# Patient Record
Sex: Female | Born: 1940 | Race: White | Hispanic: No | Smoking: Current every day smoker
Health system: Southern US, Community
[De-identification: ages and names within clinical notes are randomized; demographics above are authoritative.]

## PROBLEM LIST (undated history)

## (undated) DIAGNOSIS — N950 Postmenopausal bleeding: Principal | ICD-10-CM

## (undated) DIAGNOSIS — M069 Rheumatoid arthritis, unspecified: Secondary | ICD-10-CM

## (undated) DIAGNOSIS — I951 Orthostatic hypotension: Secondary | ICD-10-CM

## (undated) DIAGNOSIS — C8590 Non-Hodgkin lymphoma, unspecified, unspecified site: Secondary | ICD-10-CM

## (undated) DIAGNOSIS — E785 Hyperlipidemia, unspecified: Secondary | ICD-10-CM

## (undated) DIAGNOSIS — Z9289 Personal history of other medical treatment: Secondary | ICD-10-CM

## (undated) DIAGNOSIS — M545 Low back pain, unspecified: Secondary | ICD-10-CM

## (undated) DIAGNOSIS — N200 Calculus of kidney: Secondary | ICD-10-CM

## (undated) DIAGNOSIS — G8929 Other chronic pain: Secondary | ICD-10-CM

## (undated) DIAGNOSIS — I2510 Atherosclerotic heart disease of native coronary artery without angina pectoris: Secondary | ICD-10-CM

## (undated) DIAGNOSIS — N183 Chronic kidney disease, stage 3 unspecified: Secondary | ICD-10-CM

## (undated) DIAGNOSIS — F329 Major depressive disorder, single episode, unspecified: Secondary | ICD-10-CM

## (undated) DIAGNOSIS — I429 Cardiomyopathy, unspecified: Secondary | ICD-10-CM

## (undated) DIAGNOSIS — M329 Systemic lupus erythematosus, unspecified: Secondary | ICD-10-CM

## (undated) DIAGNOSIS — J189 Pneumonia, unspecified organism: Secondary | ICD-10-CM

## (undated) DIAGNOSIS — F32A Depression, unspecified: Secondary | ICD-10-CM

## (undated) DIAGNOSIS — J449 Chronic obstructive pulmonary disease, unspecified: Secondary | ICD-10-CM

## (undated) DIAGNOSIS — M3500 Sicca syndrome, unspecified: Secondary | ICD-10-CM

## (undated) DIAGNOSIS — D649 Anemia, unspecified: Secondary | ICD-10-CM

## (undated) DIAGNOSIS — R55 Syncope and collapse: Secondary | ICD-10-CM

## (undated) HISTORY — DX: Chronic kidney disease, stage 3 unspecified: N18.30

## (undated) HISTORY — DX: Systemic lupus erythematosus, unspecified: M32.9

## (undated) HISTORY — PX: BREAST BIOPSY: SHX20

## (undated) HISTORY — DX: Non-Hodgkin lymphoma, unspecified, unspecified site: C85.90

## (undated) HISTORY — PX: RETINAL DETACHMENT SURGERY: SHX105

## (undated) HISTORY — DX: Cardiomyopathy, unspecified: I42.9

## (undated) HISTORY — PX: TUBAL LIGATION: SHX77

## (undated) HISTORY — DX: Major depressive disorder, single episode, unspecified: F32.9

## (undated) HISTORY — PX: EYE SURGERY: SHX253

## (undated) HISTORY — PX: DILATION AND CURETTAGE OF UTERUS: SHX78

## (undated) HISTORY — PX: LAPAROSCOPIC CHOLECYSTECTOMY: SUR755

## (undated) HISTORY — DX: Hyperlipidemia, unspecified: E78.5

## (undated) HISTORY — PX: LITHOTRIPSY: SUR834

## (undated) HISTORY — DX: Postmenopausal bleeding: N95.0

## (undated) HISTORY — PX: TONSILLECTOMY: SUR1361

## (undated) HISTORY — DX: Syncope and collapse: R55

## (undated) HISTORY — DX: Orthostatic hypotension: I95.1

## (undated) HISTORY — DX: Chronic kidney disease, stage 3 (moderate): N18.3

## (undated) HISTORY — PX: CATARACT EXTRACTION W/ INTRAOCULAR LENS  IMPLANT, BILATERAL: SHX1307

## (undated) HISTORY — PX: LYMPH NODE DISSECTION: SHX5087

## (undated) HISTORY — DX: Sjogren syndrome, unspecified: M35.00

## (undated) HISTORY — PX: MASS EXCISION: SHX2000

## (undated) HISTORY — DX: Atherosclerotic heart disease of native coronary artery without angina pectoris: I25.10

## (undated) HISTORY — DX: Depression, unspecified: F32.A

---

## 1999-08-14 ENCOUNTER — Other Ambulatory Visit: Admission: RE | Admit: 1999-08-14 | Discharge: 1999-08-14 | Payer: Self-pay | Admitting: Otolaryngology

## 1999-09-07 ENCOUNTER — Ambulatory Visit (HOSPITAL_BASED_OUTPATIENT_CLINIC_OR_DEPARTMENT_OTHER): Admission: RE | Admit: 1999-09-07 | Discharge: 1999-09-07 | Payer: Self-pay | Admitting: Otolaryngology

## 1999-09-21 ENCOUNTER — Encounter (INDEPENDENT_AMBULATORY_CARE_PROVIDER_SITE_OTHER): Payer: Self-pay | Admitting: *Deleted

## 1999-09-21 ENCOUNTER — Ambulatory Visit (HOSPITAL_BASED_OUTPATIENT_CLINIC_OR_DEPARTMENT_OTHER): Admission: RE | Admit: 1999-09-21 | Discharge: 1999-09-22 | Payer: Self-pay | Admitting: Otolaryngology

## 1999-10-11 ENCOUNTER — Encounter: Admission: RE | Admit: 1999-10-11 | Discharge: 2000-01-09 | Payer: Self-pay | Admitting: Radiation Oncology

## 1999-10-11 ENCOUNTER — Ambulatory Visit (HOSPITAL_COMMUNITY): Admission: RE | Admit: 1999-10-11 | Discharge: 1999-10-11 | Payer: Self-pay | Admitting: Radiation Oncology

## 2000-04-11 ENCOUNTER — Encounter (INDEPENDENT_AMBULATORY_CARE_PROVIDER_SITE_OTHER): Payer: Self-pay | Admitting: *Deleted

## 2000-04-11 ENCOUNTER — Ambulatory Visit (HOSPITAL_COMMUNITY): Admission: RE | Admit: 2000-04-11 | Discharge: 2000-04-11 | Payer: Self-pay | Admitting: *Deleted

## 2000-04-14 ENCOUNTER — Other Ambulatory Visit: Admission: RE | Admit: 2000-04-14 | Discharge: 2000-04-14 | Payer: Self-pay | Admitting: Otolaryngology

## 2001-01-26 ENCOUNTER — Ambulatory Visit (HOSPITAL_COMMUNITY): Admission: RE | Admit: 2001-01-26 | Discharge: 2001-01-26 | Payer: Self-pay | Admitting: Otolaryngology

## 2001-01-26 ENCOUNTER — Encounter: Payer: Self-pay | Admitting: Otolaryngology

## 2001-02-03 ENCOUNTER — Ambulatory Visit (HOSPITAL_COMMUNITY): Admission: RE | Admit: 2001-02-03 | Discharge: 2001-02-03 | Payer: Self-pay | Admitting: Otolaryngology

## 2001-02-03 ENCOUNTER — Encounter: Payer: Self-pay | Admitting: Otolaryngology

## 2001-02-06 ENCOUNTER — Ambulatory Visit (HOSPITAL_COMMUNITY): Admission: RE | Admit: 2001-02-06 | Discharge: 2001-02-06 | Payer: Self-pay | Admitting: Otolaryngology

## 2001-02-06 ENCOUNTER — Encounter: Payer: Self-pay | Admitting: Otolaryngology

## 2001-02-20 ENCOUNTER — Ambulatory Visit (HOSPITAL_COMMUNITY): Admission: RE | Admit: 2001-02-20 | Discharge: 2001-02-20 | Payer: Self-pay | Admitting: Internal Medicine

## 2001-03-27 ENCOUNTER — Ambulatory Visit (HOSPITAL_COMMUNITY): Admission: RE | Admit: 2001-03-27 | Discharge: 2001-03-27 | Payer: Self-pay | Admitting: *Deleted

## 2001-07-15 ENCOUNTER — Ambulatory Visit (HOSPITAL_COMMUNITY): Admission: RE | Admit: 2001-07-15 | Discharge: 2001-07-15 | Payer: Self-pay | Admitting: Internal Medicine

## 2001-10-12 ENCOUNTER — Ambulatory Visit (HOSPITAL_COMMUNITY): Admission: RE | Admit: 2001-10-12 | Discharge: 2001-10-12 | Payer: Self-pay

## 2002-03-15 ENCOUNTER — Encounter: Admission: RE | Admit: 2002-03-15 | Discharge: 2002-03-15 | Payer: Self-pay | Admitting: Urology

## 2002-03-15 ENCOUNTER — Encounter: Payer: Self-pay | Admitting: Urology

## 2002-03-23 ENCOUNTER — Encounter: Payer: Self-pay | Admitting: Urology

## 2002-03-23 ENCOUNTER — Ambulatory Visit (HOSPITAL_BASED_OUTPATIENT_CLINIC_OR_DEPARTMENT_OTHER): Admission: RE | Admit: 2002-03-23 | Discharge: 2002-03-23 | Payer: Self-pay | Admitting: Urology

## 2002-06-03 ENCOUNTER — Ambulatory Visit (HOSPITAL_COMMUNITY): Admission: RE | Admit: 2002-06-03 | Discharge: 2002-06-03 | Payer: Self-pay | Admitting: Urology

## 2002-06-03 ENCOUNTER — Encounter: Payer: Self-pay | Admitting: Urology

## 2003-05-26 ENCOUNTER — Ambulatory Visit (HOSPITAL_COMMUNITY): Admission: RE | Admit: 2003-05-26 | Discharge: 2003-05-26 | Payer: Self-pay

## 2003-05-26 ENCOUNTER — Emergency Department (HOSPITAL_COMMUNITY): Admission: EM | Admit: 2003-05-26 | Discharge: 2003-05-26 | Payer: Self-pay | Admitting: Emergency Medicine

## 2004-01-19 ENCOUNTER — Ambulatory Visit (HOSPITAL_COMMUNITY): Admission: RE | Admit: 2004-01-19 | Discharge: 2004-01-19 | Payer: Self-pay | Admitting: Internal Medicine

## 2004-04-26 ENCOUNTER — Emergency Department (HOSPITAL_COMMUNITY): Admission: EM | Admit: 2004-04-26 | Discharge: 2004-04-26 | Payer: Self-pay | Admitting: Emergency Medicine

## 2004-05-13 HISTORY — PX: CORONARY ANGIOPLASTY WITH STENT PLACEMENT: SHX49

## 2004-06-07 ENCOUNTER — Ambulatory Visit: Payer: Self-pay | Admitting: Internal Medicine

## 2004-06-07 ENCOUNTER — Inpatient Hospital Stay (HOSPITAL_COMMUNITY): Admission: AD | Admit: 2004-06-07 | Discharge: 2004-06-10 | Payer: Self-pay | Admitting: Internal Medicine

## 2004-06-07 ENCOUNTER — Emergency Department (HOSPITAL_COMMUNITY): Admission: EM | Admit: 2004-06-07 | Discharge: 2004-06-07 | Payer: Self-pay | Admitting: Emergency Medicine

## 2004-06-20 ENCOUNTER — Ambulatory Visit: Payer: Self-pay | Admitting: *Deleted

## 2004-08-09 ENCOUNTER — Ambulatory Visit: Payer: Self-pay | Admitting: Internal Medicine

## 2004-09-17 ENCOUNTER — Ambulatory Visit: Payer: Self-pay | Admitting: *Deleted

## 2005-07-06 ENCOUNTER — Emergency Department (HOSPITAL_COMMUNITY): Admission: EM | Admit: 2005-07-06 | Discharge: 2005-07-06 | Payer: Self-pay | Admitting: Emergency Medicine

## 2005-10-09 ENCOUNTER — Ambulatory Visit: Payer: Self-pay | Admitting: *Deleted

## 2005-10-15 ENCOUNTER — Ambulatory Visit: Payer: Self-pay | Admitting: *Deleted

## 2005-10-15 ENCOUNTER — Encounter (HOSPITAL_COMMUNITY): Admission: RE | Admit: 2005-10-15 | Discharge: 2005-11-14 | Payer: Self-pay | Admitting: *Deleted

## 2005-10-24 ENCOUNTER — Ambulatory Visit: Payer: Self-pay | Admitting: *Deleted

## 2005-11-01 ENCOUNTER — Ambulatory Visit: Payer: Self-pay | Admitting: *Deleted

## 2005-11-06 ENCOUNTER — Ambulatory Visit: Payer: Self-pay | Admitting: Cardiology

## 2005-11-06 ENCOUNTER — Encounter: Payer: Self-pay | Admitting: Cardiology

## 2005-11-06 ENCOUNTER — Inpatient Hospital Stay (HOSPITAL_COMMUNITY): Admission: AD | Admit: 2005-11-06 | Discharge: 2005-11-09 | Payer: Self-pay | Admitting: Cardiology

## 2005-11-11 ENCOUNTER — Ambulatory Visit: Payer: Self-pay | Admitting: *Deleted

## 2006-09-05 ENCOUNTER — Encounter: Payer: Self-pay | Admitting: Ophthalmology

## 2006-09-08 ENCOUNTER — Ambulatory Visit (HOSPITAL_COMMUNITY): Admission: RE | Admit: 2006-09-08 | Discharge: 2006-09-08 | Payer: Self-pay | Admitting: Ophthalmology

## 2006-11-25 ENCOUNTER — Ambulatory Visit: Payer: Self-pay | Admitting: Cardiovascular Disease

## 2007-03-28 ENCOUNTER — Observation Stay (HOSPITAL_COMMUNITY): Admission: EM | Admit: 2007-03-28 | Discharge: 2007-03-30 | Payer: Self-pay | Admitting: Emergency Medicine

## 2007-03-29 ENCOUNTER — Ambulatory Visit: Payer: Self-pay | Admitting: Internal Medicine

## 2007-03-30 ENCOUNTER — Ambulatory Visit: Payer: Self-pay | Admitting: Internal Medicine

## 2007-03-30 ENCOUNTER — Encounter: Payer: Self-pay | Admitting: Internal Medicine

## 2007-09-03 ENCOUNTER — Ambulatory Visit: Payer: Self-pay | Admitting: Cardiovascular Disease

## 2008-08-02 ENCOUNTER — Ambulatory Visit: Payer: Self-pay | Admitting: Cardiology

## 2008-08-15 ENCOUNTER — Ambulatory Visit: Payer: Self-pay | Admitting: Cardiovascular Disease

## 2008-08-15 ENCOUNTER — Encounter: Payer: Self-pay | Admitting: Physician Assistant

## 2008-08-24 ENCOUNTER — Ambulatory Visit (HOSPITAL_COMMUNITY): Admission: RE | Admit: 2008-08-24 | Discharge: 2008-08-24 | Payer: Self-pay | Admitting: Cardiology

## 2008-09-26 ENCOUNTER — Encounter: Payer: Self-pay | Admitting: Cardiology

## 2008-09-30 ENCOUNTER — Encounter (INDEPENDENT_AMBULATORY_CARE_PROVIDER_SITE_OTHER): Payer: Self-pay | Admitting: *Deleted

## 2008-10-31 DIAGNOSIS — F329 Major depressive disorder, single episode, unspecified: Secondary | ICD-10-CM

## 2008-10-31 DIAGNOSIS — C8589 Other specified types of non-Hodgkin lymphoma, extranodal and solid organ sites: Secondary | ICD-10-CM

## 2009-04-24 ENCOUNTER — Encounter (INDEPENDENT_AMBULATORY_CARE_PROVIDER_SITE_OTHER): Payer: Self-pay | Admitting: *Deleted

## 2009-05-16 ENCOUNTER — Encounter: Payer: Self-pay | Admitting: Cardiovascular Disease

## 2009-08-28 ENCOUNTER — Ambulatory Visit: Payer: Self-pay | Admitting: Cardiovascular Disease

## 2009-08-28 ENCOUNTER — Inpatient Hospital Stay (HOSPITAL_COMMUNITY): Admission: EM | Admit: 2009-08-28 | Discharge: 2009-09-03 | Payer: Self-pay | Admitting: Emergency Medicine

## 2009-08-28 ENCOUNTER — Ambulatory Visit: Payer: Self-pay | Admitting: Cardiology

## 2009-08-28 DIAGNOSIS — R55 Syncope and collapse: Secondary | ICD-10-CM

## 2009-08-29 ENCOUNTER — Encounter (INDEPENDENT_AMBULATORY_CARE_PROVIDER_SITE_OTHER): Payer: Self-pay | Admitting: Internal Medicine

## 2009-08-30 ENCOUNTER — Ambulatory Visit: Payer: Self-pay | Admitting: Cardiology

## 2009-12-09 ENCOUNTER — Inpatient Hospital Stay (HOSPITAL_COMMUNITY): Admission: EM | Admit: 2009-12-09 | Discharge: 2009-12-10 | Payer: Self-pay | Admitting: Emergency Medicine

## 2010-01-24 ENCOUNTER — Ambulatory Visit: Payer: Self-pay | Admitting: Cardiology

## 2010-01-24 ENCOUNTER — Encounter: Payer: Self-pay | Admitting: Adult Health

## 2010-01-24 DIAGNOSIS — I951 Orthostatic hypotension: Secondary | ICD-10-CM | POA: Insufficient documentation

## 2010-01-24 DIAGNOSIS — R609 Edema, unspecified: Secondary | ICD-10-CM | POA: Insufficient documentation

## 2010-02-07 ENCOUNTER — Ambulatory Visit: Payer: Self-pay | Admitting: Cardiology

## 2010-02-12 ENCOUNTER — Telehealth (INDEPENDENT_AMBULATORY_CARE_PROVIDER_SITE_OTHER): Payer: Self-pay | Admitting: *Deleted

## 2010-04-06 ENCOUNTER — Observation Stay (HOSPITAL_COMMUNITY)
Admission: EM | Admit: 2010-04-06 | Discharge: 2010-04-10 | Payer: Self-pay | Source: Home / Self Care | Admitting: Emergency Medicine

## 2010-04-08 ENCOUNTER — Encounter: Payer: Self-pay | Admitting: Cardiovascular Disease

## 2010-04-08 ENCOUNTER — Ambulatory Visit: Payer: Self-pay | Admitting: Gastroenterology

## 2010-04-09 ENCOUNTER — Ambulatory Visit: Payer: Self-pay | Admitting: Internal Medicine

## 2010-04-10 ENCOUNTER — Ambulatory Visit: Payer: Self-pay | Admitting: Internal Medicine

## 2010-04-14 ENCOUNTER — Encounter: Payer: Self-pay | Admitting: Internal Medicine

## 2010-05-21 ENCOUNTER — Ambulatory Visit
Admission: RE | Admit: 2010-05-21 | Discharge: 2010-05-21 | Payer: Self-pay | Source: Home / Self Care | Attending: Urgent Care | Admitting: Urgent Care

## 2010-05-21 DIAGNOSIS — R1319 Other dysphagia: Secondary | ICD-10-CM | POA: Insufficient documentation

## 2010-05-28 ENCOUNTER — Encounter (INDEPENDENT_AMBULATORY_CARE_PROVIDER_SITE_OTHER): Payer: Self-pay | Admitting: *Deleted

## 2010-06-12 NOTE — Letter (Signed)
[facility] ED REPORT +01/18/10  [facility] ED REPORT +01/18/10   Imported By: Faythe Ghee 01/24/2010 15:33:33  _____________________________________________________________________  External Attachment:    Type:   Image     Comment:   External Document

## 2010-06-12 NOTE — Letter (Signed)
Summary: EKG 01/18/10  EKG 01/18/10   Imported By: Faythe Ghee 01/24/2010 15:31:19  _____________________________________________________________________  External Attachment:    Type:   Image     Comment:   External Document

## 2010-06-12 NOTE — Assessment & Plan Note (Signed)
Summary: 1 YR F/U PER CHECKOUT ON 08/15/08/TG  Medications Added TRIAMCINOLONE ACETONIDE 0.5 % CREA (TRIAMCINOLONE ACETONIDE) apply as needed FISH OIL 1000 MG CAPS (OMEGA-3 FATTY ACIDS) take 1 cap daily ASPIR-LOW 81 MG TBEC (ASPIRIN) take 1 tab daily VITAMIN D 400 UNIT CAPS (CHOLECALCIFEROL) take 1 tab daily SODIUM BICARBONATE 325 MG TABS (SODIUM BICARBONATE) take 1 tab daily DAILY MULTIPLE VITAMINS  TABS (MULTIPLE VITAMIN) take 1 tab daily OMEPRAZOLE 20 MG CPDR (OMEPRAZOLE) take 1 tab daily HYDROXYCHLOROQUINE SULFATE 200 MG TABS (HYDROXYCHLOROQUINE SULFATE) take 1 tab daily FOLIC ACID 1 MG TABS (FOLIC ACID) take 1 tab daily POTASSIUM CHLORIDE 20 MEQ/15ML (10%) LIQD (POTASSIUM CHLORIDE) take 1 tab daily AMITRIPTYLINE HCL 100 MG TABS (AMITRIPTYLINE HCL) take 1 tab daily HYDROCODONE-ACETAMINOPHEN 10-500 MG TABS (HYDROCODONE-ACETAMINOPHEN) take as needed DOXYCYCLINE HYCLATE 100 MG SOLR (DOXYCYCLINE HYCLATE) take 1 tab daily      Allergies Added: NKDA  Visit Type:  Follow-up Primary Provider:  Dr.Quereshi  CC:  [facility]  History of Present Illness: Tiffany [name] is a 70 year old female patient with a history of coronary artery disease status post inferior ST- elevation myocardial infarction January 2006 treated with a drug-eluting stent to the mid RCA who subsequent underwent relook heart catheterization in June 2007 in the setting of a false positive stress test.  According to the records, the patient had a patent stent in the RCA and nonobstructive disease elsewhere.  Apparently, her LV function has been low normal to normal in the past.  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 Lowell Guitar in [place].   She was seen in the [facility] ER on 4/11 for postural Hypotension.  I reviewed these records.  Her labs were ok but no TSH or cortisol was checked.  She has had mailaise and fatigue for a few months with posturla dizzyness over the  last few weeds.  She denies SSCP, palpitaiotns, or edema.  She had her BB stopped in the ER and is on no cardiac meds outside of ASA/Plavix.    In th office today her baseline BP is 90/40 and it drops to 50/palp with the patient about to pass out.  I did not feel comfortable sending the patient home.  She will be sent to the ER for hydration and admision to the hospitalist service.  I would check TSH/cortisol, blood cultures, echo and start florinef .2mg .    Current Problems (verified): 1)  Cad  (ICD-414.00) 2)  Lymphoma  (ICD-202.80) 3)  Lupus  (ICD-710.0) 4)  Depression  (ICD-311) 5)  Hypertension, Unspecified  (ICD-401.9) 6)  Hyperlipidemia-mixed  (ICD-272.4)  Current Medications (verified): 1)  Plavix 75 Mg Tabs (Clopidogrel Bisulfate) .... Take One Tablet By Mouth Daily 2)  Lipitor 40 Mg Tabs (Atorvastatin Calcium) .... Take One Tablet By Mouth Daily. 3)  Nitrostat 0.4 Mg Subl (Nitroglycerin) .Marland Kitchen.. 1 Tablet Under Tongue At Onset of Chest Pain; You May Repeat Every 5 Minutes For Up To 3 Doses. 4)  Triamcinolone Acetonide 0.5 % Crea (Triamcinolone Acetonide) .... Apply As Needed 5)  Fish Oil 1000 Mg Caps (Omega-3 Fatty Acids) .... Take 1 Cap Daily 6)  Aspir-Low 81 Mg Tbec (Aspirin) .... Take 1 Tab Daily 7)  Vitamin D 400 Unit Caps (Cholecalciferol) .... Take 1 Tab Daily 8)  Sodium Bicarbonate 325 Mg Tabs (Sodium Bicarbonate) .... Take 1 Tab Daily 9)  Daily Multiple Vitamins  Tabs (Multiple Vitamin) .... Take 1 Tab Daily 10)  Omeprazole 20 Mg Cpdr (Omeprazole) .... Take 1 Tab Daily 11)  Hydroxychloroquine Sulfate 200 Mg Tabs (Hydroxychloroquine Sulfate) .... Take 1 Tab Daily 12)  Folic Acid 1 Mg Tabs (Folic Acid) .... Take 1 Tab Daily 13)  Potassium Chloride 20 Meq/65ml (10%) Liqd (Potassium Chloride) .... Take 1 Tab Daily 14)  Amitriptyline Hcl 100 Mg Tabs (Amitriptyline Hcl) .... Take 1 Tab Daily 15)  Hydrocodone-Acetaminophen 10-500 Mg Tabs (Hydrocodone-Acetaminophen) .... Take As  Needed 16)  Doxycycline Hyclate 100 Mg Solr (Doxycycline Hyclate) .... Take 1 Tab Daily  Allergies (verified): No Known Drug Allergies  Past History:  Past Medical History: Last updated: 10/31/2008 LYMPHOMA (ICD-202.80) LUPUS (ICD-710.0) DEPRESSION (ICD-311) HYPERTENSION, UNSPECIFIED (ICD-401.9) HYPERLIPIDEMIA-MIXED (ICD-272.4) CAD:  IMI 2006 with stent to RCA.  Relook 2007 patent    Past Surgical History: Last updated: 10/31/2008 eye surgery left breast biopsy  nodule removed due to lymphoma.      Family History: Last updated: 10/31/2008 Family History of Cancer:  Family History of Coronary Artery Disease:   Social History: Last updated: 10/31/2008 Retired  Divorced  Tobacco Use - Yes.  Alcohol Use - no  Review of Systems       Denies fever, malais, weight loss, blurry vision, decreased visual acuity, cough, sputum, SOB, hemoptysis, pleuritic pain, palpitaitons, heartburn, abdominal pain, melena, lower extremity edema, claudication, or rash.   Vital Signs:  Patient profile:   70 year old female Height:      71 inches Weight:      162 pounds BMI:     22.68 Pulse rate:   88 / minute BP sitting:   90 / 50  (right arm) BP standing:   50 /   (left arm)  Vitals Entered By: Dreama Saa, CNA (August 28, 2009 11:20 AM)  Physical Exam  General:  Affect appropriate Healthy:  appears stated age HEENT: normal Neck supple with no adenopathy JVP normal no bruits no thyromegaly Lungs clear with no wheezing and good diaphragmatic motion Heart:  S1/S2 no murmur,rub, gallop or click PMI normal Abdomen: benighn, BS positve, no tenderness, no AAA no bruit.  No HSM or HJR Distal pulses intact with no bruits No edema Neuro non-focal Skin warm and dry    Impression & Recommendations:  Problem # 1:  CAD (ICD-414.00) STable no angina The following medications were removed from the medication list:    Metoprolol Tartrate 25 Mg Tabs (Metoprolol tartrate) .Marland Kitchen... Take  1 tab daily Her updated medication list for this problem includes:    Plavix 75 Mg Tabs (Clopidogrel bisulfate) .Marland Kitchen... Take one tablet by mouth daily    Nitrostat 0.4 Mg Subl (Nitroglycerin) .Marland Kitchen... 1 tablet under tongue at onset of chest pain; you may repeat every 5 minutes for up to 3 doses.    Aspir-low 81 Mg Tbec (Aspirin) .Marland Kitchen... Take 1 tab daily  Problem # 2:  SYNCOPE (ICD-780.2) Postural hypotension with no identifying cause.  Admit to hopitalist service We will follow.  Hydrate, florinrf .2mg  check echo Continue to hold BB The following medications were removed from the medication list:    Metoprolol Tartrate 25 Mg Tabs (Metoprolol tartrate) .Marland Kitchen... Take 1 tab daily Her updated medication list for this problem includes:    Plavix 75 Mg Tabs (Clopidogrel bisulfate) .Marland Kitchen... Take one tablet by mouth daily    Nitrostat 0.4 Mg Subl (Nitroglycerin) .Marland Kitchen... 1 tablet under tongue at onset of chest pain; you may repeat every 5 minutes for up to 3 doses.    Aspir-low 81 Mg  Tbec (Aspirin) .Marland Kitchen... Take 1 tab daily

## 2010-06-12 NOTE — Letter (Signed)
Summary: CHEST X RAY 01/18/10  CHEST X RAY 01/18/10   Imported By: Faythe Ghee 01/24/2010 15:31:57  _____________________________________________________________________  External Attachment:    Type:   Image     Comment:   External Document

## 2010-06-12 NOTE — Assessment & Plan Note (Signed)
Summary: per pt request/states PCP wants her to f/u with Cardiologist/tg   Visit Type:  Follow-up Primary Provider:  Dr.Quereshi  CC:  no cardiology complaints.  History of Present Illness: Tiffany [name] is a 70 y/o CF with known history of CAD with stent DES to RCA, Lupus and Sjogren's syndrome who was recently seen in [facility] hospital on 01/18/2010 for dizziness.  She was found to be positive for orthostatic hypotension with BP fluctuation from 115 mmHg systolic to 80 mmHg.  Review of records demonstrates chronic kidney disease and postural hypotension in April 2011.  She was placed on florinef. Her creatnine level was 1.9 in ER. She states she followed up with her Rheumatologist, Dr. Dareen Piano in [place].  She states he told her not to take the medicaiton as it would increase fluid retention and salt, which would be bad for her kidney's.  She therefore stopping taking it. She states, taking the medication did not help her to feel better.  She does not wish to take the mediction at all and is requesting alternative.  Current Medications (verified): 1)  Lipitor 40 Mg Tabs (Atorvastatin Calcium) .... Take One Tablet By Mouth Daily. 2)  Nitrostat 0.4 Mg Subl (Nitroglycerin) .Marland Kitchen.. 1 Tablet Under Tongue At Onset of Chest Pain; You May Repeat Every 5 Minutes For Up To 3 Doses. 3)  Triamcinolone Acetonide 0.5 % Crea (Triamcinolone Acetonide) .... Apply As Needed 4)  Fish Oil 1000 Mg Caps (Omega-3 Fatty Acids) .... Take 1 Cap Daily 5)  Aspir-Low 81 Mg Tbec (Aspirin) .... Take 1 Tab Daily 6)  Vitamin D 400 Unit Caps (Cholecalciferol) .... Take 1 Tab Daily 7)  Sodium Bicarbonate 325 Mg Tabs (Sodium Bicarbonate) .... Take 1 Tab Daily 8)  Daily Multiple Vitamins  Tabs (Multiple Vitamin) .... Take 1 Tab Daily 9)  Omeprazole 20 Mg Cpdr (Omeprazole) .... Take 1 Tab Daily 10)  Hydroxychloroquine Sulfate 200 Mg Tabs (Hydroxychloroquine Sulfate) .... Take 1 Tab Two Times A Day 11)  Folic Acid 1 Mg Tabs (Folic  Acid) .... Take 1 Tab Daily 12)  Potassium Chloride 20 Meq/45ml (10%) Liqd (Potassium Chloride) .... Take 1 Tab Daily 13)  Amitriptyline Hcl 100 Mg Tabs (Amitriptyline Hcl) .... Take 1 Tab Daily 14)  Hydrocodone-Acetaminophen 10-500 Mg Tabs (Hydrocodone-Acetaminophen) .... Take As Needed 15)  Azathioprine 50 Mg Tabs (Azathioprine) .... Take 1 Tab Two Times A Day 16)  Alprazolam 0.5 Mg Tabs (Alprazolam) .... Take 1 Tab Three Times A Day 17)  Levothyroxine Sodium 25 Mcg Tabs (Levothyroxine Sodium) .... Take 1 Tab Daily 18)  Sodium Chloride 5 % Soln (Sodium Chloride) .... Right Eye Qid 19)  Lubricant Eye Drops 0.4-0.3 % Soln (Polyethyl Glycol-Propyl Glycol) .Marland Kitchen.. 1 Drop Each Eye Qid 20)  Midodrine Hcl 10 Mg Tabs (Midodrine Hcl) .... Take 1 Tablet By Mouth Three Times A Day  Allergies (verified): No Known Drug Allergies  Past History:  Past Medical History: LYMPHOMA (ICD-202.80) LUPUS (ICD-710.0) DEPRESSION (ICD-311) HYPERTENSION, UNSPECIFIED (ICD-401.9) HYPERLIPIDEMIA-MIXED (ICD-272.4) CAD:  IMI 2006 with stent to RCA.  Relook 2007 patent Sjogrens Syndrome    Review of Systems       Dizziness and lightheadness.  Vital Signs:  Patient profile:   70 year old female Weight:      151 pounds Pulse rate:   89 / minute BP sitting:   132 / 71  (right arm)  Vitals Entered By: Dreama Saa, CNA (January 24, 2010 10:15 AM)  Physical Exam  General:  Well developed, well nourished,  in no acute distress. Head:  normocephalic and atraumatic Eyes:  PERRLA/EOM intact; conjunctiva and lids normal. Lungs:  Clear bilaterally to auscultation and percussion. Heart:  Non-displaced PMI, chest non-tender; regular rate and rhythm, S1, S2 without murmurs, rubs or gallops. Carotid upstroke normal, no bruit. Normal abdominal aortic size, no bruits. Femorals normal pulses, no bruits. Pedals normal pulses. No edema, no varicosities. Abdomen:  Bowel sounds positive; abdomen soft and non-tender without  masses, organomegaly, or hernias noted. No hepatosplenomegaly. Msk:  Back normal, normal gait. Muscle strength and tone normal. Pulses:  pulses normal in all 4 extremities Extremities:  No clubbing or cyanosis. Neurologic:  Alert and oriented x 3. Skin:  eczematous rash arms.Marland Kitchen   Psych:  Normal affect.   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  She is not on diuretics or antihypertensive.  Discussed this with Dr. Eden Emms who advised beginning Midodrine 10mg  three times a day.  He was also comfortable with her continuing florinef if the patient did not want to change medication for a three times a day dose.    I spoke with Dr. Dareen Piano, her rheumatologist today by phone.  He stated that he had no problem with the florinef and she could continue taking this, but midodrone was also acceptable.  He stated that he did not tell her to stop taking the florinef as the patient stated.  Fuirther discussion with the patient disputes this and she does not want to go back on florinef as she is afraid it would affect her kidney's.  I have therefore started her on the midodrine.  She is also to have knee high TED hose measured and worn daily.  She will follow-up with Dr. Eden Emms in a week - 10 days for evaluation of response to medication addition.  Problem # 2:  CAD (ICD-414.00) She denies chest pain or shortness of breath. With midodrine being a vasoconstrictor we will need to follow for occurance of angina pain. The following medications were removed from the medication list:    Plavix 75 Mg Tabs (Clopidogrel bisulfate) .Marland Kitchen... Take one tablet by mouth daily Her updated medication list for this problem includes:    Nitrostat 0.4 Mg Subl (Nitroglycerin) .Marland Kitchen... 1 tablet under tongue at onset of chest pain; you may repeat every 5 minutes for up to 3 doses.    Aspir-low 81 Mg  Tbec (Aspirin) .Marland Kitchen... Take 1 tab daily  Problem # 3:  DEPRESSION (ICD-311) He daughter wishes her to stop taking Xanax as she feels it is making her depression worse.  I have advised that she follow with her primary care physician for this discussion.  Other Orders: Durable Medical Equipment (DME)  Patient Instructions: 1)  Your physician recommends that you schedule a follow-up appointment in: 1 week 2)  Your physician has recommended you make the following change in your medication: Start taking Midodrine 10mg  by mouth three times a day  3)  Your physician recommends you be fitted for compression stockings. You have been given a prescription to take with you for stockings fitting.  Prescriptions: MIDODRINE HCL 10 MG TABS (MIDODRINE HCL) take 1 tablet by mouth three times a day  #90 x 3   Entered by:   Larita Fife Via LPN   Authorized by:   Joni Reining, NP   Signed by:   Larita Fife Via LPN on 16/02/9603   Method used:   [facility]  [facility]        [facility] [phone]* (retail)       [address]       [place], [place]  [address]       Ph: [phone] or [phone]       Fax: [phone]   RxID:   [id number]

## 2010-06-12 NOTE — Progress Notes (Signed)
Summary: medicine issues  Phone Note Call from Patient   [facility] [phone] Call For: medicine issues Summary of Call: kathyrn at [facility] called to let Bronson Curb be aware that Farra Spiering was given a prescription from Dr.Qureshi that was filled on 02/02/2010 for xanax 0.5mg  90 tabs our office gave patient diazepam 30 tabs 02/07/2010 now she is at pharmacy to fill this prescription called kathyrn she stated patient can have the diazepam but has to stop the xanax ,called pharmacy back and let Bronson Curb know that it is ok  for patient to have this drug. Initial call taken by: Dreama Saa, CNA,  February 12, 2010 8:36 AM

## 2010-06-12 NOTE — Assessment & Plan Note (Signed)
Summary: 1 wk f/u per checkout on 01/24/10/tg   Visit Type:  Follow-up Primary Provider:  Dr.Quereshi  CC:  some sob.  History of Present Illness: Tiffany [name] is a 70 y/o CF with known history of CAD with stent DES to RCA, Lupus and Sjogren's syndrome who was recently seen in [facility] hospital on 01/18/2010 for dizziness.  She was found to be positive for orthostatic hypotension with BP fluctuation from 115 mmHg systolic to 80 mmHg.  Review of records demonstrates chronic kidney disease and postural hypotension in April 2011. She was placed on Midodrine 10mg  three times a day and was measured for and had TED hose instituted, as she did not want to continue taking Florinef.  She is being followed by Dr. Dareen Piano, Rheumtologist, in [place] and was concerned how Florinef would affect her other medications and stopped taking it.  She is here for evaluation.  She is tolerating the midodrine well with only minimal dizziness, and only with sudden postition change.  She has had recent diagnosis of hiatal hernia within the last two days whiich is pulling her focus some.  She is tolerating the TED hose as well.  Current Medications (verified): 1)  Lipitor 40 Mg Tabs (Atorvastatin Calcium) .... Take One Tablet By Mouth Daily. 2)  Nitrostat 0.4 Mg Subl (Nitroglycerin) .Marland Kitchen.. 1 Tablet Under Tongue At Onset of Chest Pain; You May Repeat Every 5 Minutes For Up To 3 Doses. 3)  Triamcinolone Acetonide 0.5 % Crea (Triamcinolone Acetonide) .... Apply As Needed 4)  Fish Oil 1000 Mg Caps (Omega-3 Fatty Acids) .... Take 1 Cap Daily 5)  Aspir-Low 81 Mg Tbec (Aspirin) .... Take 1 Tab Daily 6)  Vitamin D 400 Unit Caps (Cholecalciferol) .... Take 1 Tab Daily 7)  Sodium Bicarbonate 325 Mg Tabs (Sodium Bicarbonate) .... Take 1 Tab Daily 8)  Daily Multiple Vitamins  Tabs (Multiple Vitamin) .... Take 1 Tab Daily 9)  Omeprazole 20 Mg Cpdr (Omeprazole) .... Take 1 Tab Daily 10)  Hydroxychloroquine Sulfate 200 Mg Tabs  (Hydroxychloroquine Sulfate) .... Take 1 Tab Two Times A Day 11)  Folic Acid 1 Mg Tabs (Folic Acid) .... Take 1 Tab Daily 12)  Potassium Chloride 20 Meq/6ml (10%) Liqd (Potassium Chloride) .... Take 1 Tab Daily 13)  Amitriptyline Hcl 100 Mg Tabs (Amitriptyline Hcl) .... Take 1 Tab Daily 14)  Hydrocodone-Acetaminophen 10-500 Mg Tabs (Hydrocodone-Acetaminophen) .... Take As Needed 15)  Azathioprine 50 Mg Tabs (Azathioprine) .... Take 1 Tab Two Times A Day 16)  Alprazolam 0.5 Mg Tabs (Alprazolam) .... Take 1 Tab Three Times A Day 17)  Levothyroxine Sodium 25 Mcg Tabs (Levothyroxine Sodium) .... Take 1 Tab Daily 18)  Sodium Chloride 5 % Soln (Sodium Chloride) .... Right Eye Qid 19)  Lubricant Eye Drops 0.4-0.3 % Soln (Polyethyl Glycol-Propyl Glycol) .Marland Kitchen.. 1 Drop Each Eye Qid 20)  Midodrine Hcl 10 Mg Tabs (Midodrine Hcl) .... Take 1 Tablet By Mouth Three Times A Day 21)  Valium 5 Mg Tabs (Diazepam) .... Take 1 Tablet By Mouth Two Times A Day As Needed Anxiety  Allergies (verified): No Known Drug Allergies  Comments:  Nurse/Medical Assistant: brought med list reviewed with patient midodrine 10mg  added at last ov  Review of Systems       Mild dizziness with postition change only. Depression is worse.    Vital Signs:  Patient profile:   69 year old female Weight:      152 pounds BMI:     21.28 Pulse rate:   81 /  minute BP sitting:   101 / 71  (right arm)  Vitals Entered By: Dreama Saa, CNA (February 07, 2010 10:16 AM)  Physical Exam  General:  normal appearance.   Lungs:  Clear bilaterally to auscultation and percussion. Heart:  Non-displaced PMI, chest non-tender; regular rate and rhythm, S1, S2 without murmurs, rubs or gallops. Carotid upstroke normal, no bruit. Normal abdominal aortic size, no bruits. Femorals normal pulses, no bruits. Pedals normal pulses. No edema, no varicosities. She is wearing knee high TED hose. Abdomen:  Bowel sounds positive; abdomen soft and non-tender  without masses, organomegaly, or hernias noted. No hepatosplenomegaly. Msk:  Back normal, normal gait. Muscle strength and tone normal. Pulses:  pulses normal in all 4 extremities Extremities:  No clubbing or cyanosis. Neurologic:  Alert and oriented x 3. Psych:  depressed affect.     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  She only has transient dizziness with postition change but this goes away quickly.  Will continue the medication as directed and she will follow-up with [place] in 6 months.  Problem # 2:  CAD (ICD-414.00) Assessment: Unchanged She has recently been diagnosed with hiatal hernia. She is not attributing her occasional chest discomfort to this. She is being followed by her primary for continued treatment. Her updated medication list for this problem includes:    Nitrostat 0.4 Mg Subl (Nitroglycerin) .Marland Kitchen... 1 tablet under tongue at onset of chest pain; you may repeat every 5 minutes for up to 3 doses.    Aspir-low 81 Mg Tbec (Aspirin) .Marland Kitchen... Take 1 tab daily  Problem # 3:  DEPRESSION (ICD-311) She wishes to be referred to another primary care physician as she is unhappy with current one. He is ignoring her depression symptoms according her and her daughter. She asks to stop taking xanax and requests vallium.  I recommended that she find another primary if she is unhappy with her current physician and have given her the names of 4 separate practices to choose from.  I have given her a RX for valium 5 mg two times a day as needed with NO REFILLS and advised her to seek attention for her depression and anxiety issues. She verbalizes understanding.  Patient Instructions: 1)  Your physician recommends that you schedule a follow-up appointment in: 6 months 2)  Your physician has recommended you make the following change in your medication: DO NOT TAKE ANYONE ELSE'S XANAX 3)  NEW MEDICATION:  valium 5mg  two times a day as needed  Prescriptions: VALIUM 5 MG TABS (DIAZEPAM) Take 1 tablet by mouth two times a day as needed anxiety  #60 x 0   Entered by:   Teressa Lower RN   Authorized by:   Joni Reining, NP   Signed by:   Teressa Lower RN on 02/07/2010   Method used:   Print then Give to Patient   RxID:   [id number]

## 2010-06-12 NOTE — Miscellaneous (Signed)
Summary: lipitor refill  Clinical Lists Changes  Medications: Added new medication of LIPITOR 40 MG TABS (ATORVASTATIN CALCIUM) Take one tablet by mouth daily. - Signed Rx of LIPITOR 40 MG TABS (ATORVASTATIN CALCIUM) Take one tablet by mouth daily.;  #30 x 6;  Signed;  Entered by: Teressa Lower RN;  Authorized by: Colon Branch, MD, [facility];  Method used: [facility] [phone]*, [address] [place], [place], [place]  [address], Ph: [phone] or [phone], Fax: [phone]    Prescriptions: LIPITOR 40 MG TABS (ATORVASTATIN CALCIUM) Take one tablet by mouth daily.  #30 x 6   Entered by:   Teressa Lower RN   Authorized by:   Colon Branch, MD, [facility]   Signed by:   Teressa Lower RN on 05/16/2009   Method used:   [facility] to        [facility] [phone]* (retail)       [address]       [place], [place]  [address]       Ph: [phone] or [phone]       Fax: [phone]   RxID:   [id number]

## 2010-06-12 NOTE — Letter (Signed)
Summary: [facility] 12/07/09  [facility] 12/07/09   Imported By: Faythe Ghee 01/24/2010 15:32:16  _____________________________________________________________________  External Attachment:    Type:   Image     Comment:   External Document

## 2010-06-12 NOTE — Letter (Addendum)
Summary: Patient Notice, Endo Biopsy Results  [facility]  [address]   [place], [place] [address]   Phone: [phone]  Fax: [phone]       April 14, 2010   Tiffany [name] [address] [place], [place]  [address] 1940/07/07    Dear Tiffany [name],  I am pleased to inform you that the biopsies taken during your recent endoscopic examination did not show any evidence of cancer upon pathologic examination.  There was some inflammation.  Additional information/recommendations:  Continue with the treatment plan as outlined on the day of your exam.  Please call us if you are having persistent problems or have questions about your condition that have not been fully answered at this time.  Sincerely,    R. Roetta Sessions MD, FACP [facility] Ph: [phone]   Fax: [phone]   Appended Document: Patient Notice, Endo Biopsy Results letter mailed to pt

## 2010-06-14 NOTE — Assessment & Plan Note (Signed)
Summary: HOS FU IN 4 WEEKS,DYSPEPSIA/SS   Visit Type:  Follow-up Visit Primary Care Provider:  Gae Gallop  Chief Complaint:  F/U  hosp/dyspepsia.  History of Present Illness: Here for FU post EGD.  Denies any odynophagia or dysphagia.  Denies regurgitation.  Denies rectal bleeding, melena or abdominal pain. 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  Patulous EG junction, hiatal hernia, mod chronic active gastritis.  On omeprazole 20mg  daily, finished carafate.   Feels well without concerns.  Current Medications (verified): 1)  Lipitor 40 Mg Tabs (Atorvastatin Calcium) .... Take One Tablet By Mouth Daily. 2)  Nitrostat 0.4 Mg Subl (Nitroglycerin) .Marland Kitchen.. 1 Tablet Under Tongue At Onset of Chest Pain; You May Repeat Every 5 Minutes For Up To 3 Doses. 3)  Triamcinolone Acetonide 0.5 % Crea (Triamcinolone Acetonide) .... Apply As Needed 4)  Fish Oil 1000 Mg Caps (Omega-3 Fatty Acids) .... Take 1 Cap Daily 5)  Aspir-Low 81 Mg Tbec (Aspirin) .... Take 1 Tab Daily 6)  Vitamin D 400 Unit Caps (Cholecalciferol) .... Take 1 Tab Daily 7)  Sodium Bicarbonate 325 Mg Tabs (Sodium Bicarbonate) .... Take 1 Tab Daily 8)  Daily Multiple Vitamins  Tabs (Multiple Vitamin) .... Take 1 Tab Daily 9)  Omeprazole 20 Mg Cpdr (Omeprazole) .... Take 1 Tab Daily 10)  Hydroxychloroquine Sulfate 200 Mg Tabs (Hydroxychloroquine Sulfate) .... Take 1 Tab Two Times A Day 11)  Folic Acid 1 Mg Tabs (Folic Acid) .... Take 1 Tab Daily 12)  Potassium Chloride 20 Meq/56ml (10%) Liqd (Potassium Chloride) .... Take 1 Tab Daily 13)  Amitriptyline Hcl 100 Mg Tabs (Amitriptyline Hcl) .... Take 1 Tab Daily 14)  Hydrocodone-Acetaminophen 10-500 Mg Tabs (Hydrocodone-Acetaminophen) .... Take As Needed 15)  Azathioprine 50 Mg Tabs (Azathioprine) .... Take 1 Tab Two Times A Day 16)  Alprazolam 0.5 Mg Tabs (Alprazolam)  .... Take 1 Tab Three Times A Day 17)  Levothyroxine Sodium 25 Mcg Tabs (Levothyroxine Sodium) .... Take 1 Tab Daily 18)  Sodium Chloride 5 % Soln (Sodium Chloride) .... Right Eye Qid 19)  Lubricant Eye Drops 0.4-0.3 % Soln (Polyethyl Glycol-Propyl Glycol) .Marland Kitchen.. 1 Drop Each Eye Qid 20)  Midodrine Hcl 10 Mg Tabs (Midodrine Hcl) .... Take 1 Tablet By Mouth Three Times A Day  Allergies (verified): 1)  ! Sulfa  Vital Signs:  Patient profile:   70 year old female Height:      71 inches Weight:      142 pounds BMI:     19.88 Temp:     98.1 degrees F oral Pulse rate:   92 / minute BP sitting:   124 / 70  (right arm) Cuff size:   regular  Vitals Entered By: Cloria Spring LPN (May 21, 2010 2:17 PM)  Physical Exam  General:  Well developed, well nourished, no acute distress. Head:  Normocephalic and atraumatic. Eyes:  PERRLA, no icterus. Mouth:  No deformity or lesions, dentition normal. Heart:  Regular rate and rhythm; no murmurs, rubs,  or bruits. Abdomen:  Soft, nontender and nondistended. No masses, hepatosplenomegaly or hernias noted. Normal bowel sounds.without guarding and without rebound.   Msk:  Symmetrical with no gross deformities. Normal posture. Pulses:  Normal pulses noted. Extremities:  No clubbing, cyanosis, edema or deformities noted. Neurologic:  Alert and  oriented x4;  grossly normal neurologically. Skin:  Intact without significant lesions or rashes. Psych:  Alert and cooperative.  Normal mood and affect. 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  Dysphagia resolved s/p dilation.  Orders: Est. Patient Level III ([address])  Patient Instructions: 1)  Continue omeprazole 20mg  daily 2)  Office visit 1 yr or sooner if needed

## 2010-06-14 NOTE — Miscellaneous (Signed)
Summary: CONSULTATION  Clinical Lists Changes  NAME:  Tiffany [name], Tiffany [name]                ACCOUNT NO.:  [id number]      MEDICAL RECORD NO.:  [id number]          PATIENT TYPE:  OBV      LOCATION:  A328                          FACILITY:  [facility]      PHYSICIAN:  Tiffany Eva, M.D.     DATE OF BIRTH:  1941-02-03      DATE OF CONSULTATION:  04/08/2010   DATE OF DISCHARGE:                                    CONSULTATION         PRIMARY CARE PHYSICIAN:  Dr. Virgina [name].      PRIMARY NEPHROLOGIST:  Tiffany [name].      HISTORY OF PRESENT ILLNESS:  Tiffany [name] is a 70 year old female who was   last seen and evaluated by Tiffany [name] in November 2008 for hematochezia.   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  She had an EGD and a   colonoscopy at that time.  Biopsies of the rectum showed hyperplastic   polyps.  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  She reports that her rheumatologist told her she has   Sjogren's syndrome.  She is currently on Imuran and not really sure why.   She reports left chest pain and epigastric pain to me.  Her symptoms   began Friday.  Initial H and P notes that she had right-sided abdominal   pain.  She reports that the symptoms began Friday.  They were preceded   on Thursday by one episode of vomiting.  She denies any nausea.  The   pain lasted from 30 minutes to one hour.  On Saturday she had no pain.   She had no bowel movement since admission.  She does have a history of   constipation.  She is currently on MiraLAX.  She experiences burping two   to three times a week but denies any heartburn or indigestion.  She is   maintained on aspirin and omeprazole as an outpatient.  She denies any   blood in her stool.  She has had no melena.  She no longer takes Plavix.   She does use aspirin but she denies taking any ibuprofen or Motrin.  Her   symptoms do not seem to be related to food.  She has no sick contacts.   She has problems  swallowing pills, cornbread and milk.  She has no   problems with meat but she does not eat meat on a daily basis.  She was   taken off of her iron pills.      PAST MEDICAL HISTORY:   1. Coronary artery disease, acute MI in 2006 with stent in the right       coronary artery.   2. 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   3. Renal insufficiency.   4. Lupus versus Sjogren's disease.   5. Hyperlipidemia.   6. GERD.   7. Depression.   8. Arthritis.  9. History of alcoholic hepatitis, no current alcohol use.      PAST SURGICAL HISTORY:   1. Cholecystectomy.   2. Right eye surgery in 2008.   3. Cystoscopy and uteroscopy with double-J stent placement in 2003.   4. Left tonsillectomy in 2001.   5. Excision of left submandibular gland in 2001.      FAMILY HISTORY:  Coronary artery disease, autoimmune hepatitis and   breast cancer.  She has no family history of colon cancer.      SOCIAL HISTORY:  She is divorced.  She works in Designer, fashion/clothing.  Her brother   and sister-in-law are in the room with her during the exam.      REVIEW OF SYSTEMS:  Per the HPI.  The patient also has a recorded weight   loss from April 2010 of 182 pounds to 145 pounds in 2011. Otherwise all   systems are negative.      PHYSICAL EXAMINATION:  VITAL SIGNS:  T-max 97.9, systolic blood pressure   131/91, heart rate 77, respiratory rate 16.   GENERAL:  She is in no apparent distress, alert and oriented x4.   HEENT:  Atraumatic, normocephalic.  Pupils are equal and reactive to   light.  Mouth:  No oral lesions.   NECK:  Full range of motion.  No lymphadenopathy.   LUNGS:  Clear to auscultation bilaterally.   CARDIOVASCULAR:  Regular rhythm, no murmur, normal S1 and S2.   ABDOMEN:  Bowel sounds are present, soft, mild tenderness to palpation   in the epigastrium without rebound or guarding.  EXTREMITIES:  No   cyanosis or edema.   NEUROLOGIC:  She has no focal  neurologic deficits.      LABORATORY DATA:  White count 4.1 to 3, hemoglobin 11 and 9.2, platelets   161, 34, creatinine 1.65 to 1. 96, normal hepatic function panel except   for an albumin of 3 to 2.5.      RADIOGRAPHIC STUDIES:  Abdominal ultrasound on 11/26 showed a surgically   absent gallbladder, normal intra and extrahepatic ducts, hepatic   steatosis, poorly visualized pancreas, normal sized left kidney with   cortical thinning. VQ scan showed low probability for PE.  Portable   chest x-ray showed no acute cardiopulmonary disease.      ASSESSMENT:  Tiffany [name] is a 70 year old female who presented with left   upper quadrant and left chest pain.  She probably has breakthrough   reflux disease.  Her symptoms are resolved.  The differential diagnosis   includes H. pylori, gastritis and a low likelihood of esophageal cancer   or gastric cancer.      Thank you for allowing me to see Tiffany [name] in consultation.  My   recommendations follows.      RECOMMENDATIONS:   1. EGD with Tiffany [name] on 11/28 to evaluate for her new-onset       dyspepsia.  We will check a ferritin to see if she needs iron       supplementation.  I asked her to speak with her nephrologist       regarding the need for Neupogen for renal insufficiency.   2. PPI b.i.d.   3. Would continue a heart healthy diet.   4. She may follow up with me as an outpatient in 2-3 months.               Tiffany Eva, M.D.  SF/MEDQ  D:  04/08/2010  T:  04/08/2010  Job:  [id number]      cc:   Tiffany Slicker. Lowell Guitar, M.D.   Fax: [phone]      Tiffany Organ, [name]      Tiffany [name], [facility]   [address]  [address]   [place]   [place] [address]      Electronically Signed by Tiffany Eva M.D. on 05/24/2010 06:29:14 PM

## 2010-06-14 NOTE — Consult Note (Signed)
Summary: [facility]: Consultation Report  [facility]: Consultation Report   Imported By: Earl Many 05/21/2010 16:39:07  _____________________________________________________________________  External Attachment:    Type:   Image     Comment:   External Document

## 2010-06-23 ENCOUNTER — Emergency Department (HOSPITAL_COMMUNITY): Payer: MEDICARE

## 2010-06-23 ENCOUNTER — Emergency Department (HOSPITAL_COMMUNITY)
Admission: EM | Admit: 2010-06-23 | Discharge: 2010-06-23 | Disposition: A | Discharge status: Home / Self Care | Payer: MEDICARE | Attending: Emergency Medicine | Admitting: Emergency Medicine

## 2010-06-23 DIAGNOSIS — R42 Dizziness and giddiness: Secondary | ICD-10-CM | POA: Insufficient documentation

## 2010-06-23 DIAGNOSIS — E860 Dehydration: Secondary | ICD-10-CM | POA: Insufficient documentation

## 2010-06-23 DIAGNOSIS — I252 Old myocardial infarction: Secondary | ICD-10-CM | POA: Insufficient documentation

## 2010-06-23 DIAGNOSIS — R5383 Other fatigue: Secondary | ICD-10-CM | POA: Insufficient documentation

## 2010-06-23 DIAGNOSIS — R05 Cough: Secondary | ICD-10-CM | POA: Insufficient documentation

## 2010-06-23 DIAGNOSIS — R5381 Other malaise: Secondary | ICD-10-CM | POA: Insufficient documentation

## 2010-06-23 DIAGNOSIS — I10 Essential (primary) hypertension: Secondary | ICD-10-CM | POA: Insufficient documentation

## 2010-06-23 DIAGNOSIS — M3500 Sicca syndrome, unspecified: Secondary | ICD-10-CM | POA: Insufficient documentation

## 2010-06-23 DIAGNOSIS — E785 Hyperlipidemia, unspecified: Secondary | ICD-10-CM | POA: Insufficient documentation

## 2010-06-23 DIAGNOSIS — I951 Orthostatic hypotension: Secondary | ICD-10-CM | POA: Insufficient documentation

## 2010-06-23 DIAGNOSIS — R059 Cough, unspecified: Secondary | ICD-10-CM | POA: Insufficient documentation

## 2010-06-23 DIAGNOSIS — I2510 Atherosclerotic heart disease of native coronary artery without angina pectoris: Secondary | ICD-10-CM | POA: Insufficient documentation

## 2010-06-23 DIAGNOSIS — N289 Disorder of kidney and ureter, unspecified: Secondary | ICD-10-CM | POA: Insufficient documentation

## 2010-06-23 LAB — DIFFERENTIAL
Lymphocytes Relative: 21 % (ref 12–46)
Lymphs Abs: 1.2 10*3/uL (ref 0.7–4.0)
Neutrophils Relative %: 67 % (ref 43–77)

## 2010-06-23 LAB — COMPREHENSIVE METABOLIC PANEL
ALT: 14 U/L (ref 0–35)
AST: 16 U/L (ref 0–37)
Albumin: 2.3 g/dL — ABNORMAL LOW (ref 3.5–5.2)
Alkaline Phosphatase: 63 U/L (ref 39–117)
Calcium: 8.8 mg/dL (ref 8.4–10.5)
GFR calc Af Amer: 31 mL/min — ABNORMAL LOW (ref >60)
Glucose, Bld: 79 mg/dL (ref 70–99)
Potassium: 4.6 mEq/L (ref 3.5–5.1)
Sodium: 139 mEq/L (ref 135–145)
Total Protein: 6.3 g/dL (ref 6.0–8.3)

## 2010-06-23 LAB — URINALYSIS, ROUTINE W REFLEX MICROSCOPIC
Bilirubin Urine: NEGATIVE
Hgb urine dipstick: NEGATIVE
Ketones, ur: NEGATIVE mg/dL
Protein, ur: NEGATIVE mg/dL
Urine Glucose, Fasting: NEGATIVE mg/dL
pH: 7 (ref 5.0–8.0)

## 2010-06-23 LAB — POCT CARDIAC MARKERS
CKMB, poc: <1 ng/mL — ABNORMAL LOW (ref 1.0–8.0)
Troponin i, poc: <0.05 ng/mL (ref 0.00–0.09)

## 2010-06-23 LAB — CBC
HCT: 29.1 % — ABNORMAL LOW (ref 36.0–46.0)
Platelets: 139 10*3/uL — ABNORMAL LOW (ref 150–400)
RDW: 14.2 % (ref 11.5–15.5)
WBC: 5.5 10*3/uL (ref 4.0–10.5)

## 2010-07-05 ENCOUNTER — Encounter: Payer: Self-pay | Admitting: Adult Health

## 2010-07-05 ENCOUNTER — Ambulatory Visit (INDEPENDENT_AMBULATORY_CARE_PROVIDER_SITE_OTHER): Payer: PRIVATE HEALTH INSURANCE | Admitting: Adult Health

## 2010-07-05 DIAGNOSIS — I951 Orthostatic hypotension: Secondary | ICD-10-CM

## 2010-07-05 DIAGNOSIS — R634 Abnormal weight loss: Secondary | ICD-10-CM | POA: Insufficient documentation

## 2010-07-05 DIAGNOSIS — I2510 Atherosclerotic heart disease of native coronary artery without angina pectoris: Secondary | ICD-10-CM

## 2010-07-06 ENCOUNTER — Encounter: Payer: Self-pay | Admitting: Adult Health

## 2010-07-10 NOTE — Assessment & Plan Note (Signed)
Summary: ROV SOB AND NUMBNESS IN LEGS   Visit Type:  dizziness and sob Primary Provider:  Gae Gallop   History of Present Illness: Tiffany [name] is a 70 y/o CF with known history of CAD with stent DES to RCA, Lupus and Sjogren's syndrome who was recently seen in [facility] hospital on 01/18/2010 for dizziness.  She was found to be positive for orthostatic hypotension with BP fluctuation from 115 mmHg systolic to 80 mmHg.  Review of records demonstrates chronic kidney disease and postural hypotension in April 2011. She was placed on Midodrine 10mg  three times a day and was measured for and had TED hose instituted, as she did not want to continue taking Florinef as it made  her "feel bad.".  She is being followed by Dr. Dareen Piano, Rheumtologist, in [place] and was concerned how Florinef would affect her other medications and that was another reason she stopped taking it.  Discussion with Dr. Dareen Piano on previous appointment states that she just didn't like taking it and that he had no problem with her using this medication.  Since last visit, she has been in the ER with profound dizziness and treated for dehydration.  She was found to have a Creat. 1.9 and hypotension.  She has lost 17lbs since being seen last in Sept 2011. She is not wearing her TED hose as they are too big for her, and she states that she didn't think they were helpful. She denies chest pain or shortness of breath. She is othostatic in the office today with standing BP unable to be read with dizziness.  She is accompanied by her daughter who states that she doesn't eat very much but on occaision will eat a Audiological scientist or [employer]      Current Medications (verified): 1)  Lipitor 80 Mg Tabs (Atorvastatin Calcium) .... Take One Tablet By Mouth Daily. 2)  Nitrostat 0.4 Mg Subl (Nitroglycerin) .Marland Kitchen.. 1 Tablet Under Tongue At Onset of Chest Pain; You May Repeat Every 5 Minutes For Up To 3 Doses. 3)  Triamcinolone Acetonide 0.5 % Crea (Triamcinolone  Acetonide) .... Apply As Needed 4)  Fish Oil 1000 Mg Caps (Omega-3 Fatty Acids) .... Take 1 Cap Daily 5)  Aspir-Low 81 Mg Tbec (Aspirin) .... Take 1 Tab Daily 6)  Vitamin D 400 Unit Caps (Cholecalciferol) .... Take 1 Tab Daily 7)  Sodium Bicarbonate 325 Mg Tabs (Sodium Bicarbonate) .... Take 1 Tab Daily 8)  Daily Multiple Vitamins  Tabs (Multiple Vitamin) .... Take 1 Tab Daily 9)  Omeprazole 20 Mg Cpdr (Omeprazole) .... Take 1 Tab Daily 10)  Hydroxychloroquine Sulfate 200 Mg Tabs (Hydroxychloroquine Sulfate) .... Take 1 Tab Two Times A Day 11)  Folic Acid 1 Mg Tabs (Folic Acid) .... Take 1 Tab Daily 12)  Potassium Chloride 20 Meq/27ml (10%) Liqd (Potassium Chloride) .... Take 1/2 Tablet Daily 13)  Amitriptyline Hcl 100 Mg Tabs (Amitriptyline Hcl) .... Take 1 Tab Daily 14)  Hydrocodone-Acetaminophen 10-500 Mg Tabs (Hydrocodone-Acetaminophen) .... Take As Needed 15)  Azathioprine 50 Mg Tabs (Azathioprine) .... Take 1 Tab Two Times A Day 16)  Alprazolam 0.5 Mg Tabs (Alprazolam) .... Take 1 Tab Three Times A Day 17)  Levothyroxine Sodium 25 Mcg Tabs (Levothyroxine Sodium) .... Take 1 Tab Daily 18)  Sodium Chloride 5 % Soln (Sodium Chloride) .... Right Eye Qid 19)  Lubricant Eye Drops 0.4-0.3 % Soln (Polyethyl Glycol-Propyl Glycol) .Marland Kitchen.. 1 Drop Each Eye Qid 20)  Midodrine Hcl 10 Mg Tabs (Midodrine Hcl) .... Take 1 Tablet By  Mouth Three Times A Day 21)  Fludrocortisone Acetate 0.1 Mg Tabs (Fludrocortisone Acetate) .... Take 1 Tablet By Mouth Two Times A Day  Allergies (verified): 1)  ! Sulfa  Past History:  Past medical, surgical, family and social histories (including risk factors) reviewed, and no changes noted (except as noted below).  Past Medical History: Reviewed history from 01/24/2010 and no changes required. LYMPHOMA (ICD-202.80) LUPUS (ICD-710.0) DEPRESSION (ICD-311) HYPERTENSION, UNSPECIFIED (ICD-401.9) HYPERLIPIDEMIA-MIXED (ICD-272.4) CAD:  IMI 2006 with stent to RCA.  Relook  2007 patent Sjogrens Syndrome    Past Surgical History: Reviewed history from 10/31/2008 and no changes required. eye surgery left breast biopsy  nodule removed due to lymphoma.      Family History: Reviewed history from 10/31/2008 and no changes required. Family History of Cancer:  Family History of Coronary Artery Disease:   Social History: Reviewed history from 10/31/2008 and no changes required. Retired  Divorced  Tobacco Use - Yes.  Alcohol Use - no  Review of Systems       Dizziness  Vital Signs:  Patient profile:   70 year old female Height:      71 inches Weight:      136 pounds O2 Sat:      96 % on Room air Pulse rate:   97 / minute BP standing:   100 / 70  (left arm)  Vitals Entered By: Teressa Lower RN (July 05, 2010 2:09 PM)  O2 Flow:  Room air  Physical Exam  General:  Well developed, well nourished, in no acute distress. Head:  normocephalic and atraumatic Lungs:  Clear bilaterally to auscultation and percussion. Heart:  Non-displaced PMI, chest non-tender; regular rate and rhythm, S1, S2 without murmurs, rubs or gallops. Carotid upstroke normal, no bruit. Normal abdominal aortic size, no bruits. Femorals normal pulses, no bruits. Pedals normal pulses. No edema, no varicosities. Abdomen:  Bowel sounds positive; abdomen soft and non-tender without masses, organomegaly, or hernias noted. No hepatosplenomegaly. Msk:  Very thin and unsteady on her feet. Pulses:  pulses normal in all 4 extremities Extremities:  No clubbing or cyanosis. Neurologic:  Alert and oriented x 3. Psych:  depressed affect.     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  She can be remeasured for a smaller pair since she lost weight. I have  asked Dr. Dietrich Pates to see her as well today to evaluate her and make recommendations.  He counseled her on having someone with her at all times when she stands to prevent falls and injuries. He agreed that she needs to increase salt intake naming foods that have high content-soups, chips, etc.  He has recommended that she start back on florinef 0.1 mg two times a day with a BMET in one week.  She will see him in 3 weeks for follow-up.  Problem # 2:  CAD (ICD-414.00) She denies symptoms at this time.  Aside from orthostatic hypotension, she is stable from CV standpoint. Her updated medication list for this problem includes:    Nitrostat 0.4 Mg Subl (Nitroglycerin) .Marland Kitchen... 1 tablet under tongue at onset of chest pain; you may repeat every 5 minutes for up to 3 doses.    Aspir-low 81 Mg Tbec (Aspirin) .Marland Kitchen... Take  1 tab daily  Problem # 3:  WEIGHT LOSS, ABNORMAL (ICD-783.21) She has lost 17lbs since being seen last.  She has been placed on pedialyte and is encourged to increase her calorie intake.    Other Orders: Durable Medical Equipment (DME) Future Orders: T-Basic Metabolic Panel [id number]) ... 07/12/2010 T-TSH [phone]) ... 07/12/2010  Patient Instructions: 1)  Your physician recommends that you schedule a follow-up appointment in: 2 weeks 2)  Your physician recommends that you return for lab work in: 1 week 3)  Your physician has recommended you make the following change in your medication: start taking Fludrocortisone 0.1mg  by mouth two times a day  Prescriptions: FLUDROCORTISONE ACETATE 0.1 MG TABS (FLUDROCORTISONE ACETATE) take 1 tablet by mouth two times a day  #60 x 3   Entered by:   Larita Fife Via LPN   Authorized by:   Joni Reining, NP   Signed by:   Larita Fife Via LPN on 29/56/2130   Method used:   Electronically to        [facility] [phone]* (retail)       [address]       [place], [place]  [address]       Ph: [phone] or [phone]        Fax: [phone]   RxID:   [id number]

## 2010-07-17 ENCOUNTER — Encounter: Payer: Self-pay | Admitting: *Deleted

## 2010-07-17 ENCOUNTER — Encounter: Payer: Self-pay | Admitting: Adult Health

## 2010-07-17 LAB — CONVERTED CEMR LAB
Calcium: 8.7 mg/dL (ref 8.4–10.5)
Creatinine, Ser: 1.8 mg/dL — ABNORMAL HIGH (ref 0.40–1.20)
Glucose, Bld: 78 mg/dL (ref 70–99)
Potassium: 4.3 meq/L
Sodium: 144 meq/L
Sodium: 144 meq/L (ref 135–145)
TSH: 4.193 microintl units/mL
TSH: 4.193 microintl units/mL (ref 0.350–4.500)

## 2010-07-21 ENCOUNTER — Emergency Department (HOSPITAL_COMMUNITY): Payer: MEDICARE

## 2010-07-21 ENCOUNTER — Emergency Department (HOSPITAL_COMMUNITY)
Admission: EM | Admit: 2010-07-21 | Discharge: 2010-07-21 | Disposition: A | Discharge status: Home / Self Care | Payer: MEDICARE | Attending: Emergency Medicine | Admitting: Emergency Medicine

## 2010-07-21 DIAGNOSIS — I10 Essential (primary) hypertension: Secondary | ICD-10-CM | POA: Insufficient documentation

## 2010-07-21 DIAGNOSIS — F3289 Other specified depressive episodes: Secondary | ICD-10-CM | POA: Insufficient documentation

## 2010-07-21 DIAGNOSIS — I252 Old myocardial infarction: Secondary | ICD-10-CM | POA: Insufficient documentation

## 2010-07-21 DIAGNOSIS — Z79899 Other long term (current) drug therapy: Secondary | ICD-10-CM | POA: Insufficient documentation

## 2010-07-21 DIAGNOSIS — I2510 Atherosclerotic heart disease of native coronary artery without angina pectoris: Secondary | ICD-10-CM | POA: Insufficient documentation

## 2010-07-21 DIAGNOSIS — R059 Cough, unspecified: Secondary | ICD-10-CM | POA: Insufficient documentation

## 2010-07-21 DIAGNOSIS — E785 Hyperlipidemia, unspecified: Secondary | ICD-10-CM | POA: Insufficient documentation

## 2010-07-21 DIAGNOSIS — F329 Major depressive disorder, single episode, unspecified: Secondary | ICD-10-CM | POA: Insufficient documentation

## 2010-07-21 DIAGNOSIS — R05 Cough: Secondary | ICD-10-CM | POA: Insufficient documentation

## 2010-07-21 DIAGNOSIS — F17200 Nicotine dependence, unspecified, uncomplicated: Secondary | ICD-10-CM | POA: Insufficient documentation

## 2010-07-21 DIAGNOSIS — Z87898 Personal history of other specified conditions: Secondary | ICD-10-CM | POA: Insufficient documentation

## 2010-07-21 DIAGNOSIS — R062 Wheezing: Secondary | ICD-10-CM | POA: Insufficient documentation

## 2010-07-21 DIAGNOSIS — R0602 Shortness of breath: Secondary | ICD-10-CM | POA: Insufficient documentation

## 2010-07-21 DIAGNOSIS — J40 Bronchitis, not specified as acute or chronic: Secondary | ICD-10-CM | POA: Insufficient documentation

## 2010-07-21 DIAGNOSIS — M3500 Sicca syndrome, unspecified: Secondary | ICD-10-CM | POA: Insufficient documentation

## 2010-07-21 LAB — DIFFERENTIAL
Basophils Absolute: 0 10*3/uL (ref 0.0–0.1)
Eosinophils Absolute: 0 10*3/uL (ref 0.0–0.7)
Eosinophils Relative: 0 % (ref 0–5)
Lymphocytes Relative: 32 % (ref 12–46)
Lymphs Abs: 2.1 10*3/uL (ref 0.7–4.0)
Neutrophils Relative %: 59 % (ref 43–77)

## 2010-07-21 LAB — CBC
HCT: 26.9 % — ABNORMAL LOW (ref 36.0–46.0)
MCV: 103.1 fL — ABNORMAL HIGH (ref 78.0–100.0)
Platelets: 154 10*3/uL (ref 150–400)
RBC: 2.61 MIL/uL — ABNORMAL LOW (ref 3.87–5.11)
RDW: 14.1 % (ref 11.5–15.5)
WBC: 6.4 10*3/uL (ref 4.0–10.5)

## 2010-07-21 LAB — BASIC METABOLIC PANEL
BUN: 16 mg/dL (ref 6–23)
Chloride: 111 mEq/L (ref 96–112)
GFR calc non Af Amer: 31 mL/min — ABNORMAL LOW (ref >60)
Potassium: 2.8 mEq/L — ABNORMAL LOW (ref 3.5–5.1)
Sodium: 140 mEq/L (ref 135–145)

## 2010-07-24 LAB — DIFFERENTIAL
Basophils Absolute: 0 10*3/uL (ref 0.0–0.1)
Basophils Relative: 0 % (ref 0–1)
Basophils Relative: 0 % (ref 0–1)
Eosinophils Absolute: 0.1 10*3/uL (ref 0.0–0.7)
Eosinophils Absolute: 0.1 10*3/uL (ref 0.0–0.7)
Eosinophils Absolute: 0.1 10*3/uL (ref 0.0–0.7)
Eosinophils Relative: 3 % (ref 0–5)
Eosinophils Relative: 4 % (ref 0–5)
Lymphocytes Relative: 32 % (ref 12–46)
Lymphs Abs: 1 10*3/uL (ref 0.7–4.0)
Lymphs Abs: 1 10*3/uL (ref 0.7–4.0)
Lymphs Abs: 1.3 10*3/uL (ref 0.7–4.0)
Monocytes Absolute: 0.3 10*3/uL (ref 0.1–1.0)
Monocytes Relative: 10 % (ref 3–12)
Monocytes Relative: 11 % (ref 3–12)
Monocytes Relative: 11 % (ref 3–12)
Neutro Abs: 2.3 10*3/uL (ref 1.7–7.7)
Neutrophils Relative %: 55 % (ref 43–77)
Neutrophils Relative %: 56 % (ref 43–77)

## 2010-07-24 LAB — COMPREHENSIVE METABOLIC PANEL
ALT: 18 U/L (ref 0–35)
Albumin: 3 g/dL — ABNORMAL LOW (ref 3.5–5.2)
Alkaline Phosphatase: 70 U/L (ref 39–117)
Alkaline Phosphatase: 78 U/L (ref 39–117)
BUN: 30 mg/dL — ABNORMAL HIGH (ref 6–23)
CO2: 19 mEq/L (ref 19–32)
Calcium: 9.3 mg/dL (ref 8.4–10.5)
Chloride: 114 mEq/L — ABNORMAL HIGH (ref 96–112)
Creatinine, Ser: 1.96 mg/dL — ABNORMAL HIGH (ref 0.4–1.2)
Glucose, Bld: 79 mg/dL (ref 70–99)
Glucose, Bld: 80 mg/dL (ref 70–99)
Potassium: 3.6 mEq/L (ref 3.5–5.1)
Sodium: 137 mEq/L (ref 135–145)
Total Bilirubin: 0.2 mg/dL — ABNORMAL LOW (ref 0.3–1.2)
Total Protein: 6.5 g/dL (ref 6.0–8.3)
Total Protein: 8 g/dL (ref 6.0–8.3)

## 2010-07-24 LAB — CBC
Hemoglobin: 9.2 g/dL — ABNORMAL LOW (ref 12.0–15.0)
MCH: 33.5 pg (ref 26.0–34.0)
MCH: 33.7 pg (ref 26.0–34.0)
MCHC: 35 g/dL (ref 30.0–36.0)
MCV: 98.9 fL (ref 78.0–100.0)
MCV: 99.6 fL (ref 78.0–100.0)
Platelets: 125 10*3/uL — ABNORMAL LOW (ref 150–400)
Platelets: 137 10*3/uL — ABNORMAL LOW (ref 150–400)
Platelets: 160 10*3/uL (ref 150–400)
RBC: 2.64 MIL/uL — ABNORMAL LOW (ref 3.87–5.11)
RBC: 2.75 MIL/uL — ABNORMAL LOW (ref 3.87–5.11)
RDW: 16.1 % — ABNORMAL HIGH (ref 11.5–15.5)
RDW: 16.4 % — ABNORMAL HIGH (ref 11.5–15.5)
RDW: 16.5 % — ABNORMAL HIGH (ref 11.5–15.5)
WBC: 3.6 10*3/uL — ABNORMAL LOW (ref 4.0–10.5)
WBC: 4.1 10*3/uL (ref 4.0–10.5)

## 2010-07-24 LAB — CARDIAC PANEL(CRET KIN+CKTOT+MB+TROPI)
CK, MB: 1.5 ng/mL (ref 0.3–4.0)
CK, MB: 1.8 ng/mL (ref 0.3–4.0)
Relative Index: INVALID (ref 0.0–2.5)
Total CK: 38 U/L (ref 7–177)
Troponin I: 0.01 ng/mL (ref 0.00–0.06)
Troponin I: 0.01 ng/mL (ref 0.00–0.06)
Troponin I: 0.03 ng/mL (ref 0.00–0.06)

## 2010-07-24 LAB — APTT: aPTT: 31 seconds (ref 24–37)

## 2010-07-24 LAB — PROTIME-INR
INR: 1.01 (ref 0.00–1.49)
Prothrombin Time: 13.5 seconds (ref 11.6–15.2)

## 2010-07-24 LAB — BASIC METABOLIC PANEL
BUN: 16 mg/dL (ref 6–23)
CO2: 20 mEq/L (ref 19–32)
CO2: 23 mEq/L (ref 19–32)
Chloride: 113 mEq/L — ABNORMAL HIGH (ref 96–112)
Chloride: 115 mEq/L — ABNORMAL HIGH (ref 96–112)
Creatinine, Ser: 1.61 mg/dL — ABNORMAL HIGH (ref 0.4–1.2)
GFR calc Af Amer: 37 mL/min — ABNORMAL LOW (ref >60)
Sodium: 139 mEq/L (ref 135–145)

## 2010-07-24 LAB — FERRITIN: Ferritin: 93 ng/mL (ref 10–291)

## 2010-07-24 LAB — LIPID PANEL
Cholesterol: 100 mg/dL (ref 0–200)
Triglycerides: 55 mg/dL (ref <150)

## 2010-07-24 LAB — LIPASE, BLOOD: Lipase: 43 U/L (ref 11–59)

## 2010-07-24 LAB — CK TOTAL AND CKMB (NOT AT ARMC)
CK, MB: 1.8 ng/mL (ref 0.3–4.0)
Relative Index: INVALID (ref 0.0–2.5)
Relative Index: INVALID (ref 0.0–2.5)
Total CK: 33 U/L (ref 7–177)

## 2010-07-24 LAB — POCT CARDIAC MARKERS
CKMB, poc: 1.2 ng/mL (ref 1.0–8.0)
Troponin i, poc: <0.05 ng/mL (ref 0.00–0.09)

## 2010-07-25 ENCOUNTER — Encounter: Payer: Self-pay | Admitting: *Deleted

## 2010-07-26 ENCOUNTER — Ambulatory Visit: Payer: MEDICARE | Admitting: Cardiology

## 2010-07-28 LAB — DIFFERENTIAL
Basophils Absolute: 0 10*3/uL (ref 0.0–0.1)
Basophils Absolute: 0 10*3/uL (ref 0.0–0.1)
Basophils Relative: 0 % (ref 0–1)
Basophils Relative: 1 % (ref 0–1)
Eosinophils Absolute: 0.1 10*3/uL (ref 0.0–0.7)
Lymphocytes Relative: 36 % (ref 12–46)
Monocytes Absolute: 0.2 10*3/uL (ref 0.1–1.0)
Monocytes Absolute: 0.3 10*3/uL (ref 0.1–1.0)
Neutro Abs: 1.3 10*3/uL — ABNORMAL LOW (ref 1.7–7.7)
Neutro Abs: 1.5 10*3/uL — ABNORMAL LOW (ref 1.7–7.7)
Neutrophils Relative %: 48 % (ref 43–77)
Neutrophils Relative %: 51 % (ref 43–77)

## 2010-07-28 LAB — COMPREHENSIVE METABOLIC PANEL
ALT: 10 U/L (ref 0–35)
Alkaline Phosphatase: 58 U/L (ref 39–117)
BUN: 19 mg/dL (ref 6–23)
Chloride: 114 mEq/L — ABNORMAL HIGH (ref 96–112)
Glucose, Bld: 88 mg/dL (ref 70–99)
Potassium: 3.8 mEq/L (ref 3.5–5.1)
Sodium: 136 mEq/L (ref 135–145)
Total Bilirubin: 0.3 mg/dL (ref 0.3–1.2)

## 2010-07-28 LAB — CBC
HCT: 29.2 % — ABNORMAL LOW (ref 36.0–46.0)
Hemoglobin: 9.9 g/dL — ABNORMAL LOW (ref 12.0–15.0)
MCV: 98 fL (ref 78.0–100.0)
MCV: 98.8 fL (ref 78.0–100.0)
Platelets: 136 10*3/uL — ABNORMAL LOW (ref 150–400)
RBC: 2.82 MIL/uL — ABNORMAL LOW (ref 3.87–5.11)
RBC: 2.98 MIL/uL — ABNORMAL LOW (ref 3.87–5.11)
RDW: 16.4 % — ABNORMAL HIGH (ref 11.5–15.5)
WBC: 2.7 10*3/uL — ABNORMAL LOW (ref 4.0–10.5)
WBC: 2.8 10*3/uL — ABNORMAL LOW (ref 4.0–10.5)

## 2010-07-28 LAB — BASIC METABOLIC PANEL
BUN: 13 mg/dL (ref 6–23)
Chloride: 118 mEq/L — ABNORMAL HIGH (ref 96–112)
Creatinine, Ser: 1.53 mg/dL — ABNORMAL HIGH (ref 0.4–1.2)
GFR calc Af Amer: 41 mL/min — ABNORMAL LOW (ref >60)
GFR calc non Af Amer: 34 mL/min — ABNORMAL LOW (ref >60)

## 2010-07-28 LAB — TSH: TSH: 1.779 u[IU]/mL (ref 0.350–4.500)

## 2010-07-28 LAB — MAGNESIUM: Magnesium: 2.1 mg/dL (ref 1.5–2.5)

## 2010-07-28 LAB — URINE CULTURE

## 2010-07-28 LAB — CORTISOL-AM, BLOOD: Cortisol - AM: 18.7 ug/dL (ref 4.3–22.4)

## 2010-07-28 LAB — POCT CARDIAC MARKERS
Myoglobin, poc: 140 ng/mL (ref 12–200)
Troponin i, poc: <0.05 ng/mL (ref 0.00–0.09)

## 2010-07-28 LAB — URINALYSIS, ROUTINE W REFLEX MICROSCOPIC
Bilirubin Urine: NEGATIVE
Ketones, ur: NEGATIVE mg/dL
Nitrite: NEGATIVE
Protein, ur: NEGATIVE mg/dL
Specific Gravity, Urine: 1.015 (ref 1.005–1.030)
Urobilinogen, UA: 0.2 mg/dL (ref 0.0–1.0)

## 2010-07-28 LAB — CK TOTAL AND CKMB (NOT AT ARMC)
Relative Index: INVALID (ref 0.0–2.5)
Total CK: 40 U/L (ref 7–177)

## 2010-07-31 ENCOUNTER — Ambulatory Visit: Payer: MEDICARE | Admitting: Adult Health

## 2010-07-31 LAB — BASIC METABOLIC PANEL
BUN: 23 mg/dL (ref 6–23)
BUN: 29 mg/dL — ABNORMAL HIGH (ref 6–23)
CO2: 17 mEq/L — ABNORMAL LOW (ref 19–32)
Calcium: 8.3 mg/dL — ABNORMAL LOW (ref 8.4–10.5)
Calcium: 8.3 mg/dL — ABNORMAL LOW (ref 8.4–10.5)
Chloride: 112 mEq/L (ref 96–112)
Creatinine, Ser: 1.65 mg/dL — ABNORMAL HIGH (ref 0.4–1.2)
GFR calc Af Amer: 34 mL/min — ABNORMAL LOW (ref >60)
GFR calc non Af Amer: 28 mL/min — ABNORMAL LOW (ref >60)
GFR calc non Af Amer: 31 mL/min — ABNORMAL LOW (ref >60)
Glucose, Bld: 89 mg/dL (ref 70–99)
Glucose, Bld: 99 mg/dL (ref 70–99)
Potassium: 3.8 mEq/L (ref 3.5–5.1)
Sodium: 136 mEq/L (ref 135–145)
Sodium: 137 mEq/L (ref 135–145)

## 2010-07-31 LAB — FERRITIN: Ferritin: 56 ng/mL (ref 10–291)

## 2010-07-31 LAB — COMPREHENSIVE METABOLIC PANEL
ALT: 15 U/L (ref 0–35)
ALT: 15 U/L (ref 0–35)
AST: 18 U/L (ref 0–37)
AST: 22 U/L (ref 0–37)
Albumin: 2.1 g/dL — ABNORMAL LOW (ref 3.5–5.2)
Albumin: 2.3 g/dL — ABNORMAL LOW (ref 3.5–5.2)
Alkaline Phosphatase: 73 U/L (ref 39–117)
Alkaline Phosphatase: 78 U/L (ref 39–117)
BUN: 19 mg/dL (ref 6–23)
CO2: 19 mEq/L (ref 19–32)
Calcium: 8.9 mg/dL (ref 8.4–10.5)
Chloride: 118 mEq/L — ABNORMAL HIGH (ref 96–112)
GFR calc Af Amer: 29 mL/min — ABNORMAL LOW (ref >60)
GFR calc Af Amer: 35 mL/min — ABNORMAL LOW (ref >60)
GFR calc non Af Amer: 24 mL/min — ABNORMAL LOW (ref >60)
Potassium: 3.3 mEq/L — ABNORMAL LOW (ref 3.5–5.1)
Potassium: 3.6 mEq/L (ref 3.5–5.1)
Sodium: 135 mEq/L (ref 135–145)
Sodium: 140 mEq/L (ref 135–145)
Total Bilirubin: 0.1 mg/dL — ABNORMAL LOW (ref 0.3–1.2)
Total Protein: 6.8 g/dL (ref 6.0–8.3)
Total Protein: 8.4 g/dL — ABNORMAL HIGH (ref 6.0–8.3)

## 2010-07-31 LAB — DIFFERENTIAL
Basophils Absolute: 0 10*3/uL (ref 0.0–0.1)
Basophils Absolute: 0 10*3/uL (ref 0.0–0.1)
Basophils Relative: 0 % (ref 0–1)
Basophils Relative: 0 % (ref 0–1)
Eosinophils Absolute: 0.1 10*3/uL (ref 0.0–0.7)
Eosinophils Absolute: 0.1 10*3/uL (ref 0.0–0.7)
Eosinophils Absolute: 0.2 10*3/uL (ref 0.0–0.7)
Eosinophils Relative: 1 % (ref 0–5)
Eosinophils Relative: 3 % (ref 0–5)
Eosinophils Relative: 4 % (ref 0–5)
Eosinophils Relative: 4 % (ref 0–5)
Lymphocytes Relative: 33 % (ref 12–46)
Lymphocytes Relative: 40 % (ref 12–46)
Lymphs Abs: 0.8 10*3/uL (ref 0.7–4.0)
Lymphs Abs: 1.3 10*3/uL (ref 0.7–4.0)
Lymphs Abs: 1.5 10*3/uL (ref 0.7–4.0)
Monocytes Absolute: 0.4 10*3/uL (ref 0.1–1.0)
Monocytes Absolute: 0.4 10*3/uL (ref 0.1–1.0)
Monocytes Relative: 10 % (ref 3–12)
Monocytes Relative: 6 % (ref 3–12)
Monocytes Relative: 9 % (ref 3–12)
Neutro Abs: 1.5 10*3/uL — ABNORMAL LOW (ref 1.7–7.7)
Neutro Abs: 1.8 10*3/uL (ref 1.7–7.7)
Neutrophils Relative %: 46 % (ref 43–77)

## 2010-07-31 LAB — CBC
HCT: 29 % — ABNORMAL LOW (ref 36.0–46.0)
HCT: 29.3 % — ABNORMAL LOW (ref 36.0–46.0)
Hemoglobin: 10.1 g/dL — ABNORMAL LOW (ref 12.0–15.0)
MCHC: 34.5 g/dL (ref 30.0–36.0)
MCV: 94.9 fL (ref 78.0–100.0)
Platelets: 116 10*3/uL — ABNORMAL LOW (ref 150–400)
Platelets: 124 10*3/uL — ABNORMAL LOW (ref 150–400)
Platelets: 125 10*3/uL — ABNORMAL LOW (ref 150–400)
RBC: 2.91 MIL/uL — ABNORMAL LOW (ref 3.87–5.11)
RBC: 3.06 MIL/uL — ABNORMAL LOW (ref 3.87–5.11)
RBC: 3.58 MIL/uL — ABNORMAL LOW (ref 3.87–5.11)
RDW: 16 % — ABNORMAL HIGH (ref 11.5–15.5)
RDW: 16.3 % — ABNORMAL HIGH (ref 11.5–15.5)
WBC: 3.7 10*3/uL — ABNORMAL LOW (ref 4.0–10.5)
WBC: 4.9 10*3/uL (ref 4.0–10.5)

## 2010-07-31 LAB — PHOSPHORUS: Phosphorus: 3.5 mg/dL (ref 2.3–4.6)

## 2010-07-31 LAB — URINALYSIS, ROUTINE W REFLEX MICROSCOPIC
Glucose, UA: NEGATIVE mg/dL
Leukocytes, UA: NEGATIVE
Protein, ur: 30 mg/dL — AB
Specific Gravity, Urine: 1.015 (ref 1.005–1.030)
pH: 6.5 (ref 5.0–8.0)

## 2010-07-31 LAB — CARDIAC PANEL(CRET KIN+CKTOT+MB+TROPI)
CK, MB: 2.3 ng/mL (ref 0.3–4.0)
CK, MB: 2.6 ng/mL (ref 0.3–4.0)
Relative Index: INVALID (ref 0.0–2.5)
Total CK: 41 U/L (ref 7–177)
Total CK: 48 U/L (ref 7–177)
Troponin I: 0.01 ng/mL (ref 0.00–0.06)
Troponin I: 0.03 ng/mL (ref 0.00–0.06)

## 2010-07-31 LAB — TYPE AND SCREEN
DAT, IgG: POSITIVE
Donor AG Type: NEGATIVE
Donor AG Type: NEGATIVE
PT AG Type: NEGATIVE

## 2010-07-31 LAB — FOLATE: Folate: >20 ng/mL

## 2010-07-31 LAB — URINE MICROSCOPIC-ADD ON

## 2010-07-31 LAB — MAGNESIUM: Magnesium: 2.4 mg/dL (ref 1.5–2.5)

## 2010-07-31 LAB — RETICULOCYTES
RBC.: 3.06 MIL/uL — ABNORMAL LOW (ref 3.87–5.11)
Retic Count, Absolute: 27.5 10*3/uL (ref 19.0–186.0)

## 2010-07-31 LAB — IRON AND TIBC: TIBC: 179 ug/dL — ABNORMAL LOW (ref 250–470)

## 2010-07-31 LAB — BRAIN NATRIURETIC PEPTIDE: Pro B Natriuretic peptide (BNP): 43.1 pg/mL (ref 0.0–100.0)

## 2010-07-31 NOTE — Miscellaneous (Signed)
Summary: labs bmp 07/17/2010  Clinical Lists Changes  Observations: Added new observation of CALCIUM: 8.7 mg/dL (04/54/0981 19:14) Added new observation of CREATININE: 1.80 mg/dL (78/29/5621 30:86) Added new observation of BUN: 13 mg/dL (57/84/6962 95:28) Added new observation of BG RANDOM: 78 mg/dL (41/32/4401 02:72) Added new observation of CO2 PLSM/SER: 24 meq/L (07/17/2010 14:06) Added new observation of CL SERUM: 110 meq/L (07/17/2010 14:06) Added new observation of K SERUM: 4.3 meq/L (07/17/2010 14:06) Added new observation of NA: 144 meq/L (07/17/2010 14:06) Added new observation of TSH: 4.193 microintl units/mL (07/17/2010 14:06)

## 2010-08-08 ENCOUNTER — Ambulatory Visit (INDEPENDENT_AMBULATORY_CARE_PROVIDER_SITE_OTHER): Payer: PRIVATE HEALTH INSURANCE | Admitting: Physician Assistant

## 2010-08-08 ENCOUNTER — Encounter: Payer: Self-pay | Admitting: Physician Assistant

## 2010-08-08 DIAGNOSIS — I2510 Atherosclerotic heart disease of native coronary artery without angina pectoris: Secondary | ICD-10-CM

## 2010-08-08 DIAGNOSIS — Z7189 Other specified counseling: Secondary | ICD-10-CM

## 2010-08-08 DIAGNOSIS — Z720 Tobacco use: Secondary | ICD-10-CM | POA: Insufficient documentation

## 2010-08-08 DIAGNOSIS — Z716 Tobacco abuse counseling: Secondary | ICD-10-CM

## 2010-08-08 DIAGNOSIS — I951 Orthostatic hypotension: Secondary | ICD-10-CM

## 2010-08-08 NOTE — Assessment & Plan Note (Signed)
Stable

## 2010-08-08 NOTE — Patient Instructions (Signed)
**Note De-identified Krithi Bray Obfuscation** Your physician recommends that you schedule a follow-up appointment in: 2 months  

## 2010-08-08 NOTE — Progress Notes (Signed)
HPI:  This is a 70 year old white female patient with history of orthostatic hypotension most recently placed on Florinef. She states she is doing much better since the Florinef has been added and she's increased her salt and fluid intake. She is able to walk with a walker and has very few episodes of dizziness. She is able to catch herself and sit down before she falls. She denies any frank syncope. Overall she is feeling much better.     Allergies  Allergen Reactions  . Sulfonamide Derivatives       Current Outpatient Prescriptions  Medication Sig Dispense Refill  . ALPRAZolam (XANAX) 0.5 MG tablet Take 0.5 mg by mouth at bedtime as needed.        Marland Kitchen amitriptyline (ELAVIL) 10 MG tablet Take 10 mg by mouth at bedtime.        Marland Kitchen aspirin 81 MG tablet Take 81 mg by mouth daily.        Marland Kitchen atorvastatin (LIPITOR) 40 MG tablet Take 40 mg by mouth daily.        Marland Kitchen azaTHIOprine (IMURAN) 50 MG tablet Take 50 mg by mouth 2 (two) times daily.        . Cholecalciferol (VITAMIN D) 400 UNITS capsule Take 400 Units by mouth daily.        . fludrocortisone (FLORINEF) 0.1mg /mL SUSP Take 0.1 mg by mouth 2 (two) times daily.        . folic acid (FOLVITE) 1 MG tablet Take 1 mg by mouth daily.        Marland Kitchen HYDROcodone-acetaminophen (LORTAB) 10-500 MG per tablet Take 1 tablet by mouth every 6 (six) hours as needed.        . hydroxychloroquine (PLAQUENIL) 200 MG tablet Take 200 mg by mouth daily.        Marland Kitchen levothyroxine (SYNTHROID, LEVOTHROID) 25 MCG tablet Take 25 mcg by mouth daily.        . midodrine (PROAMATINE) 10 MG tablet Take 10 mg by mouth 3 (three) times daily.        . Multiple Vitamins-Minerals (MULTIVITAMIN WITH MINERALS) tablet Take 1 tablet by mouth daily.        . nitroGLYCERIN (NITROSTAT) 0.4 MG SL tablet Place 0.4 mg under the tongue every 5 (five) minutes as needed.        . Omega-3 Fatty Acids (FISH OIL) 1000 MG CAPS Take 1 capsule by mouth daily.        Marland Kitchen omeprazole (PRILOSEC) 20 MG capsule Take  20 mg by mouth daily.        Bertram Gala Glycol-Propyl Glycol (LUBRICANT EYE DROPS) 0.4-0.3 % SOLN Apply 1 drop to eye 4 (four) times daily.        . potassium chloride SA (K-DUR,KLOR-CON) 20 MEQ tablet Take 20 mEq by mouth 2 (two) times daily.        . sodium bicarbonate 325 MG tablet Take 325 mg by mouth daily.        . sodium chloride (MURO 128) 5 % ophthalmic solution 1 drop 4 (four) times daily.        Marland Kitchen triamcinolone (KENALOG) 0.5 % cream Apply topically 3 (three) times daily.             Past Medical History  Diagnosis Date  . Lymphoma   . Lupus   . Depression   . Hypertension   . Hyperlipidemia   . Coronary artery disease     Imdur 19 2006 treated with stent to the RCA  relook in 2007 stent was patent  . Sjogren's syndrome       Past Surgical History  Procedure Date  . Left breast biopsy       History   Social History  . Marital Status: Divorced    Spouse Name: N/A    Number of Children: N/A  . Years of Education: N/A   Occupational History  . disabled    Social History Main Topics  . Smoking status: Current Everyday Smoker -- 0.5 packs/day    Types: Cigarettes  . Smokeless tobacco: Not on file  . Alcohol Use: Not on file  . Drug Use: Not on file  . Sexually Active: Not on file   Other Topics Concern  . Not on file   Social History Narrative  . No narrative on file    Review of Systems  Constitution: Negative.  HENT: Negative.   Eyes: Negative.   Cardiovascular: Negative for chest pain, claudication, cyanosis, dyspnea on exertion, irregular heartbeat, leg swelling, near-syncope, orthopnea, palpitations, paroxysmal nocturnal dyspnia and syncope.  Respiratory: Negative.  Negative for cough, hemoptysis, shortness of breath, sleep disturbances due to breathing, snoring, sputum production and wheezing.   Endocrine: Negative for cold intolerance and heat intolerance.  Hematologic/Lymphatic: Negative for adenopathy and bleeding problem. Does not  bruise/bleed easily.  Musculoskeletal: Negative.   Gastrointestinal: Negative.   Genitourinary: Negative for bladder incontinence, dysuria, flank pain, frequency, hematuria, hesitancy, nocturia and urgency.  Neurological: Negative.   Allergic/Immunologic: Negative for environmental allergies.   PHYSICAL EXAM Well-nournished, in no acute distress. Neck: No JVD, HJR, Bruit, or thyroid enlargement Lungs: No tachypnea, clear without wheezing, rales, or rhonchi Cardiovascular: RRR, PMI not displaced, heart sounds normal, no murmurs, gallops, bruit, thrill, or heave. Abdomen: BS normal. Soft without organomegaly, masses, lesions or tenderness. Extremities: without cyanosis, clubbing or edema. Good distal pulses bilateral SKin: Warm, no lesions or rashes  Musculoskeletal: No deformities Neuro: no focal signs  BP 127/86  Pulse 82  Ht 5\' 10"  (1.778 m)  Wt 141 lb (63.957 kg)  BMI 20.23 kg/m2  EKG:  ASSESSMENT AND PLAN:

## 2010-08-08 NOTE — Assessment & Plan Note (Signed)
Patient is not orthostatic today. She is doing much better on the Florinef, TED hose, and increased fluid in her diet. We will continue her current course.

## 2010-08-22 ENCOUNTER — Encounter: Payer: MEDICARE | Admitting: *Deleted

## 2010-09-25 NOTE — H&P (Signed)
NAME:  Tiffany [name], Tiffany [name]                ACCOUNT NO.:  [id number]   MEDICAL RECORD NO.:  [id number]          PATIENT TYPE:  INP   LOCATION:  A214                          FACILITY:  [facility]   PHYSICIAN:  Dorris Singh, DO    DATE OF BIRTH:  1941-02-01   DATE OF ADMISSION:  03/28/2007  DATE OF DISCHARGE:  LH                              HISTORY & PHYSICAL   The patient is a 70 year old Caucasian female who presented to the  emergency room with lightheadedness and weakness lasting for a few days.  She also complained of one episode of rectal bleeding while she was at  home.  Her primary care physician is not in town.  She states that she  did have one episode of bright red blood per rectum.  While she was in  the ED, it was found that she was to be anemic and with a blood level of  10.8.  At that point in time with added weakness, it was determined that  she should probably be at least admitted and evaluated to make sure that  this was an active bleed.   PAST MEDICAL HISTORY:  Significant for depression, hyperlipidemia,  hypertension, lupus, lymphoma and myocardial infarction.  Her social  history is significant for no drug abuse.  She is a non-drinker, and she  does smoke about 5 cigarettes a day.  Renal insufficiency.   SOCIAL HISTORY:  She has had eye surgery and a left breast biopsy and a  nodule removed due to lymphoma.   ALLERGIES:  HER ALLERGIES ARE TO SULFA MEDICATIONS AND IVP DYE.   MEDICATIONS:  She currently is on:  1. Potassium chloride 10 mg three times a day.  2. Sodium bicarbonate 325 three times a day.  3. Metoprolol tartrate 12.5 twice a day.  4. Plavix 75 mg twice a day.  5. Lipitor 40 mg twice a day.  6. Aspirin 81 twice a day.  7. Nitroglycerin as needed.  8. __________  mg as needed.  9. Hydrocodone acetaminophen 5/500 mg as needed.   REVIEW OF SYSTEMS:  Generally negative.  Generally positive for weakness  or fatigue.  Is negative for change in vision.  EARS,  NOSE AND THROAT:  Noncontributory.  HEART:  Negative for chest pain or palpitations.  LUNGS:  Negative for dyspnea or shortness of breath or wheezing.  GI:  Positive for rectal bleeding.  EXTREMITIES:  Negative for weakness or  tremor.  HEMATOLOGIC:  Positive for anemia.   PHYSICAL EXAMINATION:  VITAL SIGNS:  As follows include:  Her  temperature is 99.6, her blood pressure 117/65 with a pulse rate of 88  and respirations 18.  She is pulse oxing at 99% on room air.  GENERAL:  This is a 70 year old Caucasian female who is well-developed,  well-nourished, in no acute distress.  Her head is normocephalic,  atraumatic.  HEENT:  Eyes:  Equally reactive to light bilaterally.  The patient does  wear glasses.  Her mouth is no erythema or exudates, positive dentures.  NECK:  Supple, full range of motion.  No lymphadenopathy noted or  JVD  noted.  HEART:  Her heart rate is regular rate and rhythm.  No murmurs, S1 and  S2 present.  LUNGS:  Clear to auscultation bilaterally.  No wheezes, rales or  rhonchi.  ABDOMEN:  Soft, nontender, nondistended and bowel sounds in all four  quadrants.  No organomegaly noted.  EXTREMITIES:  Legs:  Positive pulses.  No ecchymosis, cyanosis or edema  noted.  SKIN:  Positive multiple excoriations on limbs, upper and lower.   LABORATORY:  She had a white count of 5.0, hemoglobin of 10.8 and  hematocrit of 32.0, platelet count of 201.  CMP:  Sodium 136, potassium  3.8,  chloride 107, carbon dioxide 24, glucose 104, BUN 24 and  creatinine of 2.17.  There was no additional testing that was noted.   ASSESSMENT/PLAN:  1. Anemia.  2. Possible gastrointestinal bleed.  3. Lupus.  4. Hypertension.  5. Hyperlipidemia.  6. Depression.   1. Will admit the patient to the telemetry floor.      a.     Will monitor with serial H&H's, or hemoglobin or hematocrit.       Will also do a GI consult in the morning.  Will hydrate patient       via IV fluids.  Will give her clear  liquid diet tonight and make       her NPO after midnight.  Will place her on her home medications       and monitor pain management and DVT and GI prophylaxis.      Dorris Singh, DO  Electronically Signed     CB/MEDQ  D:  03/28/2007  T:  03/28/2007  Job:  [id number]

## 2010-09-25 NOTE — Assessment & Plan Note (Signed)
[facility]                        CARDIOLOGY OFFICE NOTE   NAME:Tiffany [name], Tiffany [name]                       MRN:          [id number]  DATE:11/25/2006                            DOB:          11/23/1940    [name] is seen today as a new patient by me. She has had previous stent to  the right coronary artery in 1996.  She had a false positive Myoview  in  2007.  Repeat catheterization showed no critical restenosis.   She has lupus with history of nephritis and baseline chronic renal  insufficiency.  I believe her creatinine runs close to 2 at the  baseline.  She needs to follow up with Dr. Lowell Guitar.  In talking to the  patient she has not had any significant PND or orthopnea.  There has  been no chest pain. She has been fairly active.   Her review of systems otherwise is negative.   She currently takes:  1. 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  2. Potassium 10 t.i.d. .  3. Prevacid 30 daily.  4. Evista.  5. Plaquenil 200 b.i.d.  6. Lopressor 12.5 b.i.d.  7. Lipitor 40 daily.  8. Hydrocortisone.  9. Plavix 75 mg daily.   The patient's exam is remarkable for blood pressure 112/68, respiratory  rate of 12.  She is afebrile, weighs 191, pulse is 68 and regular.  HEENT:  Normal.  NECK:  Supple.  There is no lymphadenopathy, no JVP elevation, no  bruits, no thyromegaly.  LUNGS:  Clear with good diaphragmatic motion and no wheezing  There is an S1, S2 with normal heart sounds.  PMI is normal.  ABDOMEN:  Benign. There is no renal bruits, no AAA and no tenderness.  Bowel sounds are positive.  There is no hepatosplenomegaly,  hepatojugular reflexes.  Distal pulses are intact, no edema.  NEURO:  Nonfocal.  There is no muscular weakness.  SKIN:  Warm and dry.   IMPRESSION:  1. History of coronary disease with stent to the right coronary      artery.  Continue aspirin and Plavix.  She has had a false positive      Myoview last year and I  do not see the need to repeat it, since she      is asymptomatic.  2. Hypercholesterolemia.  The patient is going on welfare soon.  We      will switch her Lipitor over to Zocor 40 a day for a generic 4      dollar prescription.  She will have a followup lipid and liver      profile in 6 months.  3. Nephritis with lupus.  Needs a followup with Dr. Lowell Guitar, we will      try to arrange this.  Baseline creatinine 2.  Continue sodium      bicarb for renal tubular acidosis and potassium replacement.  4. 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  Continue Plaquenil.      Follow up with Dr. Phylliss Bob in [place].   I will see the patient back in 6 months.     Theron Arista  Phillips Hay, MD, [facility]  Electronically Signed    PCN/MedQ  DD: 11/25/2006  DT: 11/26/2006  Job #: [id number]

## 2010-09-25 NOTE — Op Note (Signed)
NAME:  JOCI, DRESS                ACCOUNT NO.:  [id number]   MEDICAL RECORD NO.:  [id number]          PATIENT TYPE:  INP   LOCATION:  A214                          FACILITY:  [facility]   PHYSICIAN:  R. Roetta Sessions, M.D. DATE OF BIRTH:  19-Nov-1940   DATE OF PROCEDURE:  03/30/2007  DATE OF DISCHARGE:  03/30/2007                               OPERATIVE REPORT   EGD with Elease Hashimoto dilation followed by colonoscopy with biopsy.   INDICATIONS FOR PROCEDURE:  A 70 year old lady with recurrent esophageal  dysphagia and intermittent rectal bleeding.  She is Hemoccult positive.  EGD and colonoscopy are now being done.  This approach has been  discussed with the patient at length.  Potential risks, benefits and  alternatives have been reviewed, questions answered.  Please see  documentation in medical record.   PROCEDURE NOTE:  O2 saturation, blood pressure, pulse, respirations were  monitored throughout entire procedure.   CONSCIOUS SEDATION:  1. Versed 8 mg IV.  2. Demerol 125 mg IV in divided doses.  3. Cetacaine spray for topical oropharyngeal anesthesia.   INSTRUMENT:  Pentax video chip system.   FINDINGS:  EGD examination of tubular esophagus revealed a cervical  esophageal web.  The EG junction was patulous.  The intervening  esophageal mucosa appeared normal except for some tiny erosions at the  EG junction.  The EG junction was easily traversed with the scope.   STOMACH:  The gastric cavity was emptied and insufflated with well air.  A thorough examination of the gastric mucosa, including retroflexed view  of the proximal stomach and esophagogastric junction demonstrated a  small hiatal hernia only.  Pylorus was patent and easily traversed.  Examination of the bulb and second portion revealed no abnormalities.   THERAPEUTIC/DIAGNOSTIC MANEUVERS PERFORMED:  A 69 French Maloney dilator  was passed to full insertion with ease.  A look back revealed web had  been ruptured without prior  complication.  The patient tolerated the  procedure well, was prepared for colonoscopy.  Digital rectal exam  revealed no abnormalities except for anal canal hemorrhoids.   ENDOSCOPIC FINDINGS:  Prep was adequate.   COLON:  Colonic mucosa was surveyed from the rectosigmoid junction  through the left transverse, right colon to the appendiceal orifice,  ileocecal valve and cecum.  These structures were well seen and  photographed for the record.  From this level the scope was slowly and  cautiously withdrawn.  All previously mentioned mucosa surfaces were  again seen.  The patient was noted to have melanosis coli diffusely.  The remainder of the colonic mucosa appeared normal.  The scope was  pulled down into the rectum where a thorough examination of rectal  mucosa, including retroflexed view of the anal verge.  On retroflexed  view, the anal canal demonstrated only again anal canal hemorrhoids and  a couple of 2 mm diminutive/mammillations tumor biopsied.  The remainder  of rectal mucosa appeared normal.  Patient tolerated both procedures  well, was reactive to endoscopy.   IMPRESSION:  1. Cervical esophageal web status post dilation as described above.  2. Retained  distal esophageal erosions consistent with mild erosive      reflux esophagitis, patulous esophagogastric junction, small hiatal      hernia, otherwise normal stomach, D1, D2.  Colonoscopy findings      anal canal hemorrhoids (likely source of rectal bleeding),      diminutive rectal polyp status post cold biopsy removal, otherwise      normal rectum.  3. Melanosis coli.  Remainder of colonic mucosa appeared normal.   RECOMMENDATIONS:  1. Continue proton pump inhibitor therapy.  2. Would add MiraLax 17 grams orally at bedtime p.r.n. constipation.  3. A 10-day course of Anusol-HC suppositories, one per rectum at      bedtime.  4. Advance diet as tolerated.  5. Follow up path.  6. Home any time per hospitalist from a GI  standpoint.   ADDENDUM:  Her hemoglobin today remained stable at 8.      R. Roetta Sessions, M.D.  Electronically Signed     RMR/MEDQ  D:  03/30/2007  T:  03/30/2007  Job:  [id number]   cc:   Hospitalist Team

## 2010-09-25 NOTE — Assessment & Plan Note (Signed)
[facility]                       [place] CARDIOLOGY OFFICE NOTE   NAME:Tiffany [name]                       MRN:          [id number]  DATE:08/15/2008                            DOB:          31-Oct-1940    CARDIOLOGIST:  Noralyn Pick. Eden Emms, MD, [facility]   PRIMARY CARE PHYSICIAN:  Dr. Colon Branch in [place]   REASON FOR VISIT:  Annual followup.   HISTORY OF PRESENT ILLNESS:  Ms. Tiffany [name] is a 70 year old female patient  with a history of coronary artery disease status post inferior ST-  elevation myocardial infarction January 2006 treated with a drug-eluting  stent to the mid RCA who subsequent underwent relook heart  catheterization in June 2007 in the setting of a false positive stress  test.  According to the records, the patient had a patent stent in the  RCA and nonobstructive disease elsewhere.  Apparently, her LV function  has been low normal to normal in the past.  She also has chronic kidney  disease with a history of lupus nephritis and is followed by Dr. Lowell Guitar  in [place].  She presents to the office today for annual followup.  She did go to the [facility] Emergency Room recently and was  admitted for chest pain.  She apparently saw one of our cardiologists  and underwent an inpatient stress test.  She tells me that this was  negative.  She has been doing well since that time.  She denies any  exertional chest heaviness or tightness.  She denies any significant  exertional shortness of breath.  She denies orthopnea, PND, or pedal  edema.  She denies syncope or palpitations.  She does admit to  indigestion about once to twice weekly.  She denies any significant  dysphagia.  She denies any melena or hematochezia.   CURRENT MEDICATIONS:  Folic acid 400 mg daily, Fish oil 1200 mg b.i.d.,  Lipitor 40 mg daily, Metoprolol 12.5 mg b.i.d., Aspirin 81 mg daily,  Amitriptyline 100 mg at bedtime, Evista 60 mg daily, Bicarb 2 tablets 3  times  a day, Hydroxychloroquine 200 mg b.i.d.,  Klor-Con M 10 daily, Plavix 75 mg daily, Iron daily, Nitroglycerin  p.r.n.   ALLERGIES:  SULFA.   PHYSICAL EXAMINATION:  GENERAL:  She is a well-nourished, well-developed  female in no acute distress.  VITAL SIGNS:  Blood pressure is 114/78, pulse 67, weight 182 pounds.  HEENT:  Normal.  NECK:  Without JVD.  CARDIAC:  Normal S1 and S2.  Regular rate and rhythm.  No murmurs.  LUNGS:  Clear to auscultation bilaterally.  ABDOMEN:  Soft, nontender.  EXTREMITIES:  Without edema.  SKIN:  Warm and dry.  NEUROLOGIC:  She is alert and oriented x3.  Cranial nerves II through  XII grossly intact.  VASCULAR:  There is a faint right carotid bruit noted.   Electrocardiogram reveals sinus rhythm with rate of 69, normal axis,  inferior Q-waves - question significance.  No ischemic changes.   ASSESSMENT AND PLAN:  1. Coronary artery disease status post prior inferior ST-elevation      myocardial infarction June 2006 treated  with a Taxus drug-eluting      stent to the right coronary artery.  She had a nuclear study      performed in June 2007 that was abnormal and apparently falsely      positive.  According to the records, her relook catheterization      demonstrated a patent stent to the RCA and nonobstructive disease      elsewhere.  Apparently, her LV function has been low normal to      normal in the past.  She did have an admission at [facility]      recently with chest discomfort.  Records are pending at this time.      It sounds as though the patient had a stress test that was negative      for ischemia and also ruled out for myocardial infarction by      enzymes.  She has had some atypical symptoms that sound more      consistent with acid reflux disease/dyspepsia.  She does admit to      having indigestion at least twice a week.  She denies any      significant dysphagia.  I have recommended Pepcid 20 mg b.i.d.  She      should follow up  with her primary care physician further for this.      I have warned her of the possible interaction of some of the proton-      pump inhibitors like Nexium and Prilosec with Plavix.  She does      continue on Plavix and aspirin.  2. Dyslipidemia.  This is followed by Dr. Virgina Organ.  Her goal LDL is      less than or equal to 70.  3. Right carotid bruit.  She will be set up for carotid Dopplers to      assess for significant stenosis.  4. Chronic kidney disease.  This is followed by Dr. Lowell Guitar.  She      apparently has lupus nephritis.   DISPOSITION:  The patient will follow up with Dr. Eden Emms in 1 year or  sooner p.r.n.      Tereso Newcomer, PA-C  Electronically Signed      Gerrit Friends. Dietrich Pates, MD, [facility]  Electronically Signed   SW/MedQ  DD: 08/15/2008  DT: 08/16/2008  Job #: [id number]   cc:   Colon Branch, MD

## 2010-09-25 NOTE — Group Therapy Note (Signed)
NAME:  Tiffany [name], Tiffany [name]                ACCOUNT NO.:  [id number]   MEDICAL RECORD NO.:  [id number]          PATIENT TYPE:  INP   LOCATION:  A214                          FACILITY:  [facility]   PHYSICIAN:  Dorris Singh, DO    DATE OF BIRTH:  10/09/1940   DATE OF PROCEDURE:  DATE OF DISCHARGE:                                 PROGRESS NOTE   Patient seen this morning.  She had no complaints.  She actually did  well last night.  She did not have any bright red blood per rectum.  We  kept her n.p.o. until GI could see her.  Once they see her, then we can  advance her diet.   PHYSICAL EXAMINATION:  VITAL SIGNS:  Temperature 97.1, pulse 87,  respirations 20, blood pressure 129/74.  GENERAL:  This is a 70 year old female who is resting comfortably in bed  with no acute complaints this morning.  HEART:  Regular rate and rhythm.  S1 and S2.  LUNGS:  Clear to auscultation bilaterally.  No wheezing, rhonchi, or  rales.  ABDOMEN:  Soft, nontender, nondistended.  Bowel sounds in all 4  quadrants.  EXTREMITIES:  Positive pulses.  No ecchymosis, cyanosis, or edema.   LABORATORY DATA:  She has lab work ordered for this morning which has  not come up just yet, but we will go ahead and assess to see that her  hemoglobin has stayed stable.  I spoke with Tana Coast of [facility], and we will go ahead and see what the recommendations  are.  They plan on a colonoscopy first thing Monday to see if there are  any changes.  We will continue to monitor the patient and adjust any  changes as necessary.  Her BMP this morning was 138, potassium 4.4,  chloride 109, CO2 24, glucose 95, BUN 21, creatinine 1.83.      Dorris Singh, DO  Electronically Signed     CB/MEDQ  D:  03/29/2007  T:  03/29/2007  Job:  [id number]

## 2010-09-25 NOTE — Assessment & Plan Note (Signed)
[name]                       [facility] OFFICE NOTE   NAME:Tiffany [name]                       MRN:          [id number]  DATE:09/03/2007                            DOB:          1940/11/07    Tiffany [name] returns today for follow-up.  I last her on November 25, 2006.  She has  been doing well.  She has a history of distant coronary disease with a  stent to the right coronary artery back in 1996.  Her last cath in 2007  showed it to be widely patent.  She had a false positive Myoview at that  time.  She denied any significant chest pain, PND, orthopnea, no  palpitations, no shortness of breath.  She has significant lupus with  nephritis and follows with Dr. Casimiro Needle.   She does not seem to be complaining of any new problems with that.   As far as I can tell, her baseline creatinine is around 1.8.   REVIEW OF SYSTEMS:  The patient had [employer] note indicating they  would like to switch her Lipitor to simvastatin or pravastatin.  I told  her that would be fine.  She uses the [facility] in [place], and I  told her we would switch her Lipitor 40 mg to pravastatin 40 mg.   The patient is not having significant chest pain.  She is active.  She  is actually living with her daughter and 2 grandkids and stays quite  busy.  She does not like to travel too far, however.   Her review of systems is otherwise negative.   MEDICATIONS:  1. Sodium bicarb.  2. Potassium 10 mEq b.i.d. for renal tubular acidosis.  3. Prevacid 30 mg a day.  4. Evista.  5. Plaquenil 20 mg b.i.d.  6. Metoprolol 12.5 mg b.i.d.  7. Lipitor to be changed to Pravachol 40 mg a day.  8. An aspirin a day.  9. Plavix 75 mg a day.  10.Prilosec.  11.Folic acid.  12.Fish oil.   EXAM:  Remarkable for a healthy-appearing middle-aged white female in no  distress.  Weight is 177, blood pressure is 130/80, pulse 78 and regular, afebrile,  respiratory rate 16.  HEENT:   Unremarkable.  Carotids are normal and without bruit.  No  lymphadenopathy, thyromegaly or JVP elevation.  No bruits.  She does  have a bit of a malar rash under her cheeks.  LUNGS:  Clear with good diaphragmatic motion.  No wheezing.  S1 S2, normal heart sounds.  PMI normal.  ABDOMEN:  Benign.  Bowel sounds positive.  No bruit.  No tenderness.  No  hepatosplenomegaly or hepatojugular reflux.  Distal pulses are intact.  No edema.  NEUROLOGIC:  Nonfocal.  SKIN:  Warm and dry.  No muscular weakness.  She does have a bit of  scaling and cracked skin in her hands despite using lotion on a regular  basis.   Her EKG shows sinus rhythm with septal Q-waves in II, III and F, likely  normal for her and I do not think it represents previous inferior  lateral wall  infarct.   IMPRESSION:  1. Coronary disease, distant history of stent, false positive Myoview      in 2007, with stent patent by catheterization.  Continue aspirin,      Plavix and beta-blocker.  2. Hypertension, currently well-controlled, more likely secondary to      kidney disease.  Continue current dose of beta-blocker.  3. Hypercholesterolemia in the setting of coronary disease.  Switch      Lipitor to Pravachol to save money.  4. History of reflux.  Continue Prilosec 20 mg a day.  5. 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  Follow up with Dr.      Lowell Guitar.  Continue Plaquenil.   Tiffany [name] is doing well.  We will see her back in a year.     Tiffany [name]. Tiffany Emms, MD, Tiffany [name]  Electronically Signed    PCN/MedQ  DD: 09/03/2007  DT: 09/03/2007  Job #: [id number]

## 2010-09-25 NOTE — Consult Note (Signed)
NAME:  Tiffany [name], Tiffany [name]                ACCOUNT NO.:  [id number]   MEDICAL RECORD NO.:  [id number]          PATIENT TYPE:  INP   LOCATION:  A214                          FACILITY:  [facility]   PHYSICIAN:  R. Roetta Sessions, M.D. DATE OF BIRTH:  Mar 13, 1941   DATE OF CONSULTATION:  03/29/2007  DATE OF DISCHARGE:                                 CONSULTATION   REASON FOR CONSULTATION:  Hematochezia.   HISTORY OF PRESENT ILLNESS:  The patient is a 70 year old Caucasian  female who activated EMS yesterday when she started to feel lightheaded  and weak.  Apparently, her blood pressure showed some orthostasis when  she was evaluated, and it was advised that she come to the emergency  department.  There, she was found to have mild anemia, with hemoglobin  of 10.8.  She had reported an episode of bright red blood per rectum,  which she describes as small volume, which had occurred the day  previously.  It occurred with passing a very large hard stool.  She has  chronic constipation and has a bowel movement about once a week.  She  has not seen any melena.  On rectal exam in the emergency department,  she was Hemoccult positive.  It was advised that she be admitted to make  sure she did not have active GI bleeding.  She denies any abdominal  pain.  She does have chronic constipation, which she states has been  worse since she was treated with radiation therapy back several years  ago for head and neck cancer.  She does have chronic GERD and lately has  not been as well controlled.  She did well on Prevacid, but states that  her Medicaid will not cover it.  She is not on anything at this time.  She does have recurrent dysphagia.  She has problems swallowing pills,  and she has to chew her food thoroughly.  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  She had her  esophagus stretched initially to 35 [place] and then to 18 [place], the  last time being in November 2002.   She also had erosive reflux  esophagitis at that time.  She denies any nausea, vomiting, or abdominal  pain.  She denies any unintentional weight loss.   MEDICATIONS AT HOME:  1. Potassium chloride 10 mEq t.i.d.  2. Hydroxychloroquine 200 mg b.i.d.  3. Evista 60 mg daily.  4. Sodium bicarbonate 325 mg t.i.d.  5. Metoprolol 25 mg b.i.d.  6. Plavix 75 mg daily.  7. Aspirin 81 mg daily.  8. Lipitor 40 mg daily.  9. Nitroglycerin 0.4 mg p.r.n.  10.Vicodin 5/500 mg p.r.n.Marland Kitchen  11.Amitriptyline 50 mg 1-2 tablets at bedtime.   ALLERGIES:  1. SULFA causes rash.  2. IVP DYE she was told not to take due to her renal insufficiency.   PAST MEDICAL HISTORY:  1. History of coronary artery disease.  She had an MI in 2006 and      underwent PTCA with stenting of the right coronary artery.  Last  cardiac catheterization in August 2007 revealed mild coronary      atherosclerosis, but her stent was widely patent.  2. She has a history of squamous cell carcinoma of the head and neck.      It presented with metastasis of the submandibular lymph node.      However, primary was never really determined.  She did undergo      radiation therapy, with good results, and has remained in remission      since 2001.  3. 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  4. Hyperlipidemia.  5. GERD.  6. Depression.  7. Arthritis.  8. History of alcoholic hepatitis and hepatomegaly, for which she was      admitted to [facility] about 10+      years ago.  She says she is not drinking alcohol since then.  9. She had a left tonsillectomy at time of her surgery for her and      neck cancer.  10.She had right eye surgery for retinal detachment in April 2008.  11.Bilateral cataract extractions in the remote past.  12.Benign left breast biopsy.  13.She had an EGD in October 2002 for dysphagia and was found to have      esophageal  stricture of the cervical region, felt to be due to      radiation therapy.  At that time, she was stressed a 59 [place].      She pain back in November 2002 and had a repeat dilatation for      persistent stricture up to a 26 [place].  At that time, she had      erosive reflux esophagitis and small sliding hiatal hernia.  14.Her last colonoscopy was in 2003, and she had inflammatory polyp at      that time.  15.She has also had a cholecystectomy.   FAMILY HISTORY:  Both parents are deceased secondary to coronary artery  disease.  Father was 36, and mother was 17.  Her mother also had  cirrhosis secondary to autoimmune hepatitis.  She has a sister who was  treated for breast carcinoma in her 39s and remains in remission.  No  family history of colorectal cancer.   SOCIAL HISTORY:  She is divorced.  She has three children.  She lives  with her daughter.  She worked in the [employer] for over 10 years,  but is now retired.  She has been smoking for over 20 years and is down  to having 4-5 cigarettes a day.  She has not consumed any alcohol in  over 10 years.   REVIEW OF SYSTEMS:  See HPI for GI, CONSTITUTIONAL.  CARDIOPULMONARY:  She denies any chest pain or shortness of breath. GENITOURINARY:  Denies  any dysuria or hematuria.   PHYSICAL EXAMINATION:  VITAL SIGNS:  Temperature 97.1, pulse 90,  respirations 14, blood pressure 129/74, O2 saturation is 96% on room  air,  height 71 inches, weight 84.2 kg. above physical exam.  GENERAL:  Pleasant elderly Caucasian female in no acute distress.  SKIN:  Warm and dry.  No jaundice.  HEENT:  Sclerae anicteric.  Oropharyngeal mucosa moist and pink.  No  lesions, erythema, or exudate.  NECK:  No lymphadenopathy or thyromegaly.  CHEST:  Lungs are clear to auscultation.  CARDIAC:  Reveals regular rate and rhythm.  Normal S1, S2.  No murmurs,  rubs or gallops.  ABDOMEN:  Positive bowel sounds.  Abdomen soft, nontender, nondistended.  No  organomegaly or masses.  No rebound tenderness or guarding.  No  abdominal bruits or hernias.  RECTAL:  Deferred. at it was done in the emergency department.  She was  Hemoccult positive.  LOWER EXTREMITIES:  No edema.   LABORATORIES:  White count 3600, hemoglobin 11 today. was 10.8 on  admission, her MCV was 93.8, er platelets 195,000, hematocrit 32.9.  Sodium 138, potassium 3, BUN 21, creatinine 1.83, down from 2.17 on  admission, glucose was 95.  Ferritin 76, back 2 years ago was 238, her  iron was 45, 2 years ago it was 76, her iron saturation was 17%, TIBC  267.  INR was 1.1.   IMPRESSION:  The patient is a pleasant 70 year old lady who presented  basically with an episode of weakness and lightheadedness.  She has some  orthostasis on initial evaluation.  She had a recent single episode of  small-volume bright red blood per rectum, associated with passing a  large hard stool in the setting of chronic constipation.  She was  Hemoccult positive on exam.  She does have mild normocytic anemia which  she also had back in 2006 when we evaluated her.  Unfortunately, at that  time she did not complete her evaluation including Hemoccults.  Her last  colonoscopy has been 5 years ago.  She needs to have a colonoscopy for  further evaluation of rectal bleeding, Hemoccult positive stools, and  mild anemia.  Her anemia may be due more or less to anemia of chronic  disease, although her ferritin has declined since we saw her a couple  years ago.  She in addition does have chronic constipation and is not on  any type of therapy for that.  She has chronic GERD and recurrent  dysphagia, with a history of esophageal stricture due to previous  radiation therapy.  I would recommend upper endoscopy for further  evaluation as well.   RECOMMENDATIONS:  1. I have discussed with Dr. Dorris Singh this morning.  The plans      is to continue to keep the patient at least one more day to confirm       stability.  Will go ahead and prep her today for colonoscopy and      EGD for Monday.  2. Continue to hold her Plavix and aspirin.  3. She may have a clear liquid diet today.  4. As an outpatient, I would recommend treating chronic constipation      with MiraLax 17 g daily as needed.  I would also recommend a high-      fiber diet.   I would like to thank Dr. Dorris Singh for allowing [place] to take part  in the care of this patient.      Tana Coast, P.AJonathon Bellows, M.D.  Electronically Signed    LL/MEDQ  D:  03/29/2007  T:  03/30/2007  Job:  [id number]   cc:   Dorris Singh, DO   [place]  Fax: [phone]

## 2010-09-28 NOTE — Discharge Summary (Signed)
NAME:  Tiffany, [name]                ACCOUNT NO.:  [id number]   MEDICAL RECORD NO.:  [id number]          PATIENT TYPE:  INP   LOCATION:  6525                         FACILITY:  [facility]   PHYSICIAN:  Learta Codding, M.D. LHCDATE OF BIRTH:  Jun 28, 1940   DATE OF ADMISSION:  11/06/2005  DATE OF DISCHARGE:  11/09/2005                                 DISCHARGE SUMMARY   PRIMARY CARDIOLOGIST:  Dr. Vida Roller.   PRIMARY CARE PHYSICIAN:  Dr. Britt Bottom C. Powell.   PRINCIPAL DIAGNOSIS:  Unstable angina.   SECONDARY DIAGNOSES:  1.  Coronary artery disease.  2.  Chronic renal insufficiency/end-stage renal disease.  3.  History of renal tubular acidosis.  4.  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  5.  Hypertension.  6.  History of salivary gland tumor, status post radiation resection.  7.  Gastroesophageal reflux disease.  8.  Insomnia.   ALLERGIES:  SULFA CAUSES A RASH.   PROCEDURES:  Left heart cardiac catheterization.   HISTORY OF PRESENT ILLNESS:  This is a 70 year old female with prior history  of coronary artery disease status post revascularization of the right  coronary artery in 05/2004 and had a recent adenosine Myoview in 09/2005  revealing a fixed defect in the anterior wall with depressed LV systolic  function.  She has been having ongoing progressive angina and decision was  made to admit her 06/27 for pre-cardiac catheterization hydration, given her  history of renal insufficiency.   HOSPITAL COURSE:  Following  hydration Tiffany [name] underwent left heart  cardiac catheterization on 06/28 revealing diffuse nonobstructive coronary  artery disease with patent stent in the right coronary artery.  Decision was  made to continue medical therapy including aspirin, beta blocker, Plavix and  statin.  Her postcatheterization creatinine has remained stable in the 1.9-2  range and, she is being discharged home this morning in satisfactory  condition.   DISCHARGE LABS:  Hemoglobin 10.1.   Hematocrit 30. WBC 4, platelets 173, MCV  92.5.  Sodium 138.  Potassium 4.2.  Chloride 110.  CO2 21.  BUN 14,  creatinine 1.9, glucose 92.  Total bilirubin 0.3, alkaline phosphatase 72,  AST 15, ALT 12, albumin 3.2.  total cholesterol 125, triglycerides 80, HDL  37, LDL 72, calcium 8.8.   DISPOSITION:  The patient is being discharged home today in good condition.   FOLLOW-UP PLANS AND APPOINTMENTS:  She is asked to follow up with her  primary care physician, Dr. Casimiro Needle, in 3-4 weeks.  She has a follow-up  up with Dr. Vida Roller in [place] on 07/02 at 11 a.m.   DISCHARGE MEDICATIONS:  1.  Aspirin 81 mg q.d.  2.  Lipitor 40 mg q.d.  3.  Nitroglycerin 0.4 mg sublingual p.r.n. chest pain.  4.  Metoprolol 12.5 mg b.i.d.  5.  Plaquenil 2 mg b.i.d.  6.  Evista 60 mg q.d.  7.  Klor-Con 10 mEq t.i.d.  8.  Sodium bicarbonate 650 mg t.i.d.  9.  Plavix 75 mg q.d.  10. Prevacid 30 mg q.d.  11. Hydrocortisone, as previously prescribed.   OUTSTANDING  LAB STUDIES:  None.   DURATION DISCHARGE ENCOUNTER:  35 minutes including physician time.      Ok Anis, NP      Learta Codding, M.D. [facility]  Electronically Signed    CRB/MEDQ  D:  11/09/2005  T:  11/09/2005  Job:  [id number]   cc:   Mindi Slicker. Lowell Guitar, M.D.  Fax: [phone]

## 2010-09-28 NOTE — Consult Note (Signed)
[facility]  Patient:    Tiffany [name], Tiffany [name] Visit Number: [id number] MRN: [id number]          Service Type: OUT Location: RAD Attending Physician:  Lucky [name] Dictated by:   Tiffany [name], M.D. Proc. Date: 02/18/01 Admit Date:  02/06/2001 Discharge Date: 02/06/2001   CC:         Tiffany [name]  [facility]  Tiffany [name]   Consultation Report  DATE OF BIRTH: 02-23-1941  REASON FOR CONSULTATION: Dysphagia.  Proximal esophageal stricture.  HISTORY OF PRESENT ILLNESS: Tiffany [name] is a 70 year old Caucasian female, who is referred through the courtesy of Tiffany [name] for further evaluation and treatment of recently diagnosed cervical esophageal stricture.  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 While primary could never be located she has remained in remission.  It was squamous cell carcinoma.  Tiffany [name] was seen by Tiffany [name] on January 19, 2001 when she was complaining of dysphagia.  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  A 12.5 mm pill passed this area with some delay.  At the time of the study she also had a chest x-ray which was negative for metastatic disease.  Tiffany [name] states that she has had dysphagia for about two years but it has been gradually getting worse and she now experiences it very frequently and also with her pills.  She points to the level of her lower neck or suprasternal area as the site of bolus obstruction.  She is eventually able to pass it down.  She does not report any difficulty with liquids.  She has had some dry mouth but she denies chronic cough or hoarseness.  She states she used to get heartburn real bad, particularly at night, but since she has been on sodium  bicarbonate she has not experienced it any more.  She was begun on Prevacid 30 mg q.d. by Tiffany [name] last month.  She is on sodium bicarbonate 650 mg t.i.d., Celexa 40 mg q.a.m., MVI q.d., Tylenol p.r.n., Doxepin 50 mg q.h.s., Evista 60 mg q.d., and hydroxychloroquine 200 mg b.i.d.  REVIEW OF SYSTEMS: Negative for abdominal pain, diarrhea, constipation, rectal bleeding, or melena.  She also denies recent weight loss.  She states she used to weight 190 pounds following her surgery and radiotherapy treatment.  Her weight dropped by about 40 pounds but she has been gradually gaining it back.  PAST MEDICAL HISTORY:  1. History of metastatic squamous cell carcinoma to head and neck as above.  2. Renal tubular acidosis.  3. Depression.  4. About five years ago she was admitted to [facility] with pneumonia and at     that time told to have alcoholic hepatitis and hepatomegaly.  She was able     to quit drinking alcohol at that time.  5. In addition to her neck surgery she has had BTL and bilateral cataract     extraction.  ALLERGIES: Some IV med, she cannot remember the name.  FAMILY HISTORY: Father died of heart attack at age 1, mother died of heart problems at age 29 and she also had cirrhosis secondary to autoimmune hepatitis and was under my care for several years.  She has one younger sister with history of breast carcinoma and was in remission.  Another sister and four brothers are in good health.  SOCIAL HISTORY: She is divorced.  She has three healthy children.  She presently is not working.  She has been smoking about half a pack a day for the last 14 years and trying hard to quit.  She used to drink beer, about a six-pack per day, but was able to quit five years ago.  All in all, she drank for 20 years.  PHYSICAL EXAMINATION:  GENERAL: Pleasant, well-developed, well-nourished Caucasian female who is in no acute distress.  VITAL SIGNS: Weight 171 pounds.  Height 5 feet 11  inches.  Pulse 78 per minute.  Blood pressure 114/70.  TEMP 98.2 degrees.  HEENT: Conjunctivae pink.  Sclerae nonicteric.  Oropharyngeal mucosa dry. Upper and lower dentures in place.  NECK: She has a scar on the left side of her upper neck.  There is no lymphadenopathy or thyromegaly.  CARDIAC: Regular rhythm, normal S1 and S2, no murmur or gallop noted.  LUNGS: Clear to auscultation.  ABDOMEN: Symmetrical.  Bowel sounds normal.  Palpation reveals a soft abdomen without organomegaly or masses.  RECTAL: Examination deferred as she had one by Tiffany [name] a few months ago revealing guaiac negative stool.  EXTREMITIES: She does not have peripheral edema or clubbing.  ASSESSMENT: Tiffany [name] is a 70 year old Caucasian female, with a two year history of gradually progressive dysphagia, primarily to solids and pills, who was noted to have what appears to be an esophageal stricture in the cervical esophagus. She also is noted to have a tiny Zenkers diverticulum which would appear to be an incidental finding.  I suspect her stricture is related to prior radiation therapy and should be amenable to endoscopic therapy.  It is interesting to note that her heartburn resolved completely since she has been on sodium bicarbonate she is receiving for renal tubular acidosis.  RECOMMENDATIONS: Diagnostic esophagogastroduodenoscopy along with esophageal dilatation to be performed at [facility] in the near future.  I have reviewed the procedure risks with the patient and she is agreeable.  I would like to thank Tiffany [name] for giving [place] the opportunity to participate in the care of this nice lady. Dictated by:   Tiffany [name], M.D. Attending Physician:  Lucky [name] DD:  02/18/01 TD:  02/19/01 Job: [id number]

## 2010-09-28 NOTE — H&P (Signed)
NAME:  Tiffany [name] NO.:  [id number]   MEDICAL RECORD NO.:  [id number]           PATIENT TYPE:   LOCATION:                                 FACILITY:   PHYSICIAN:  Vida Roller, M.D.   DATE OF BIRTH:  January 12, 1941   DATE OF ADMISSION:  11/06/2005  DATE OF DISCHARGE:                                HISTORY & PHYSICAL   HISTORY OF PRESENT ILLNESS:  Tiffany [name] is a lady that I follow who had  coronary artery disease status post percutaneous revascularization for right  coronary artery in January of 2006.  She presented for evaluation with chest  discomfort early in May and has had a work-up that includes an adenosine  Myoview which shows a fixed defect in the anterior wall with depressed LV  systolic function.  She previously had had normal LV systolic function.  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  She  currently has intermittent discomfort in her chest associated with shortness  of breath, [place] Cardiovascular Society Class III angina.  We had decided  to pursue invasive assessment but due to her very significant kidney  disease, we felt that it was best to bring her in prior to her heart  catheterization and hydrate her, have the nephrologist play an active roll  in managing her potential for contrast induced nephropathy after discussion  with Dr. Lowell Guitar and address her coronary disease with just a catheterization  with coronaries only.   CURRENT MEDICATIONS:  1.  Sodium bicarb 650 mg three times a day.  2.  Potassium 10 mEq three times a day.  3.  Prevacid 30 mg once a day.  4.  Evista 60 mg once a day.  5.  Plaquenil 200 mg twice a day.  6.  Metoprolol 12.5 mg b.i.d.  7.  Lipitor 40 mg a  day.  8.  Aspirin 81 mg a day.  9.  She takes hydrocortisone and I am not sure of the dose, once a day.  10. She is on Plavix 75 mg a day.  11. She takes nitroglycerin on an as needed basis.   ALLERGIES:  She is allergic to SULFA, she gets a rash.   PAST MEDICAL HISTORY:  As previously described.   REVIEW OF SYSTEMS:  Generally negative.   SOCIAL HISTORY:  Unremarkable.   PHYSICAL EXAMINATION:  GENERAL APPEARANCE:  She is an elderly white female  in no apparent distress.  She is alert and oriented x4.  VITAL SIGNS:  Her weight is 190 pounds, blood pressure is 138/76, pulse 74.  HEENT:  Unremarkable.  NECK:  Supple.  There is no jugular venous distension or carotid bruits.  CHEST:  Decreased breath sounds at the bases. There are no rales noted.  CARDIOVASCULAR:  Regular without a murmur.  First and second heart sounds  are normal.  Point of  maximal impulse is not displaced.  ABDOMEN:  Soft and nontender, normal active bowel sounds.  EXTREMITIES:  Lower extremities are without significant clubbing, cyanosis,  or edema.  There are no obvious bruits in the femoral area.  Pulses are 1+.  NEUROLOGIC:  Grossly nonfocal.  MUSCULOSKELETAL:  Grossly nonfocal.   Electrocardiogram shows sinus rhythm at a rate of 74, normal intervals,  normal axes.  No ischemic STT wave changes.  She does have Q-waves in II,  III and aVF consistent with an old inferior wall MI.   Labs and chest x-ray are pending.   Tiffany [name] will be admitted.  Hopefully we can get Dr. Lowell Guitar to see her  that day.  Will hydrate her at a mL/kg/hour until her catheterization.  She  is on p.o. bicarb so I am not going to add bicarb to her prehydration  status.  She will need an echocardiogram  when she is admitted and will get  some routine screening laboratories and I will see her back after this has  been completed.      Vida Roller, M.D.  Electronically Signed     JH/MEDQ  D:  11/01/2005  T:  11/01/2005  Job:   [id number]   cc:   Mindi Slicker. Lowell Guitar, M.D.  Fax: [phone]

## 2010-09-28 NOTE — Procedures (Signed)
NAME:  Tiffany [name], Tiffany [name] NO.:  [id number]   MEDICAL RECORD NO.:  [id number]          PATIENT TYPE:  EMS   LOCATION:  ED                            FACILITY:  [facility]   PHYSICIAN:  Vida Roller, M.D.   DATE OF BIRTH:  1941/03/13   DATE OF PROCEDURE:  10/15/2005  DATE OF DISCHARGE:  07/06/2005                                    STRESS TEST   HISTORY:  [name] is a 70 year old female with coronary disease status  post stent to her RCA in January 2006 following a myocardial infarction.  The patient now has complaints of dyspnea on exertion and some chest  discomfort with exertion occasionally.   Baseline data electrocardiogram rhythm at 75 beats per minute.  Blood  pressure is 148/72.   Prior to exercise, she did have a brief run questionable supraventricular  tachycardia that was asymptomatic.  She was initially scheduled for exercise  and walked for about 45 seconds, developed severe hip pain and was unable to  continue exercising.  She was subsequently changed over to adenosine  Myoview.   Then 48 mg of adenosine was infused over four-minute protocol.  Myoview was  injected three minutes.  The patient reported funny feeling and shortness  of breath with adenosine that resolved in recovery.  EKG revealed no  arrhythmias.  No ischemic changes were noted.   Final images and results are pending M.D. review.      Jae Dire, P.A. [facility]      Vida Roller, M.D.  Electronically Signed    AB/MEDQ  D:  10/15/2005  T:  10/15/2005  Job:  [id number]

## 2010-09-28 NOTE — Cardiovascular Report (Signed)
NAME:  Tiffany [name], Tiffany [name] NO.:  [id number]   MEDICAL RECORD NO.:  [id number]          PATIENT TYPE:  INP   LOCATION:  6525                         FACILITY:  [facility]   PHYSICIAN:  Jonelle Sidle, M.D. LHCDATE OF BIRTH:  [birth year]   DATE OF PROCEDURE:  DATE OF DISCHARGE:                              CARDIAC CATHETERIZATION   REQUESTING CARDIOLOGIST:  Dr. Vida Roller.   INDICATIONS:  Mrs. Kotlarz is a 70 year old woman with a history of coronary  artery disease, status post stent placement to the right coronary artery in  January 2006 in the setting of an acute coronary syndrome.  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  She has recently undergone ischemic testing via an adenosine  Myoview, which revealed a fixed anterior wall defect with decreased left  ventricular function, which was new with comparison to previous assessment.  This has been in the setting of angina.  She is referred now for diagnostic  coronary angiography, having been admitted for pre-hydration and renal  consultation.  Her creatinine this morning is 1.9.  The potential risk of  contrast nephropathy has been discussed with the patient and informed  consent was obtained.   PROCEDURES PERFORMED:  1.  Left   Dictation ended at this point      Jonelle Sidle, M.D. [facility]     SGM/MEDQ  D:  11/07/2005  T:  11/07/2005  Job:  [id number]   cc:   Vida Roller, M.D.  Fax: [phone]   Mindi Slicker. Lowell Guitar, M.D.  Fax: [phone]

## 2010-09-28 NOTE — Cardiovascular Report (Signed)
NAMEMARCUS, GROLL                ACCOUNT NO.:  [id number]   MEDICAL RECORD NO.:  [id number]          PATIENT TYPE:  INP   LOCATION:  2921                         FACILITY:  [facility]   PHYSICIAN:  Carole Binning, M.D. LHCDATE OF BIRTH:  1940-11-25   DATE OF PROCEDURE:  06/07/2004  DATE OF DISCHARGE:                              CARDIAC CATHETERIZATION   Cardiac Catheterization   PROCEDURE PERFORMED:  1.  Left heart catheterization with coronary angiography and left      ventriculography.  2.  Percutaneous transluminal coronary angioplasty with placement of a drug-      eluting stent in the mid right coronary.   INDICATIONS:  Ms. Chaviano is a 70 year old woman without prior cardiac  history. She presented to [facility] with substernal chest pain.  EKG was consistent with an acute inferior wall myocardial infarction. She  was transferred to [facility] for emergent cardiac catheterization.   CATHETERIZATION PROCEDURAL NOTE:  A 6-French sheath was placed in the right  femoral artery and 6-French sheath in the right femoral vein. Coronary  angiography was performed  with standard Judkins 6-French catheters. Left  ventriculography was performed and an angled pigtail catheter. Because of  mild renal insufficiency, Visipaque contrast was utilized. There are no  complications.   CATHETERIZATION RESULTS:   HEMODYNAMICS:  Left ventricular pressure 120/20. Aortic pressure 116/66.  There is no aortic valve gradient.   LEFT VENTRICULOGRAM:  There is moderate akinesis of the inferior wall.  Ejection fraction is calculated at 53%. There is there is 1 to 2+ mild  mitral regurgitation.   CORONARY ARTERIOGRAPHY:  The left main has a distal 30% stenosis.   The left anterior descending artery has a tubular 40% stenosis in the  proximal vessel. In the mid-vessel just after large first diagonal branch,  there is a 50% stenosis followed by a diffuse 30% stenosis. The LAD gives  rise to a single large bifurcating diagonal branch.   The left circumflex gives rise to a small first obtuse marginal and a large  second obtuse marginal branch. The second obtuse marginal branch has a  tubular 30% stenosis proximally.   Right coronary is a dominant vessel.  There is a 40% stenosis the proximal  vessel, 40% in the mid vessel, followed by 100% occlusion of the mid-vessel  with thrombus and TIMI-0 flow. The distal right coronary gives rise to a  normal-sized posterior descending artery and a small posterolateral branch.  There were grade 1 left-to-right collaterals filling the distal right  coronary.   IMPRESSION:  1.  Mildly decreased left ventricular systolic function secondary to and      acute inferior wall myocardial infarction.  2.  One-vessel coronary artery disease characterized by 100% occlusion in      the mid right coronary. There is mild -to-moderate, but nonobstructive      disease in the and left coronary arteries.   PLAN:  Percutaneous intervention to the right coronary, see below.   PERCUTANEOUS TRANSLUMINAL CORONARY ANGIOPLASTY PROCEDURAL NOTE:  Following  completion of diagnostic catheterization we proceeded directly with  percutaneous coronary intervention. The patient been started on heparin and  Integrilin prior to her arrival in the catheterization laboratory. An  additional bolus of Integrilin was given and additional heparin was given to  maintain a therapeutic ACT. We utilized a 6-French JR-4 guiding catheter. A  Hi-Torque floppy coronary guidewire was advanced under fluoroscopic guidance  successfully beyond the occlusion and positioned in the distal vessel; this  restored partial reperfusion into the distal vessel. At that point, the  patient became hypotensive and bradycardic. She was treated with intravenous  atropine and intravenous fluids with improvement in her heart rate and blood  pressure. We then performed PTCA with a 2.5 x 15.0-mm  Maverick balloon for 2  inflations to 6 and then 8 atmospheres. Following this, we positioned a 2.5  x 24.0-mm TAXUS drug-eluting stent across the diseased segment of vessel  which was right at the acute margin of the right coronary. This stent was  deployed at deployment pressure of 15 atmospheres. A single dose of  intracoronary nitroglycerin was administered. Final angiographic images were  then obtained revealing patency of the right coronary with 0% residual  stenosis at the stent site and TIMI-3 flow into the distal vessel.   COMPLICATIONS:  None.   RESULTS:  Successful PTCA with placement with drug-eluting stent in the mid  right coronary. A 100% occlusion with thrombus and TIMI-0 flow was reduced  to 0% residual with TIMI-3 flow.   PLAN:  Will continue Integrilin for an additional 24 hours. The patient has  been enrolled in the Triton study and will be treated with a study drug for  1 year per protocol.      MWP/MEDQ  D:  06/07/2004  T:  06/07/2004  Job:  [id number]   cc:   Mindi Slicker. Lowell Guitar, M.D.  [address]  [place]  [place] [address]  Fax: [phone]

## 2010-09-28 NOTE — Cardiovascular Report (Signed)
NAME:  Tiffany [name], Tiffany [name] NO.:  [id number]   MEDICAL RECORD NO.:  [id number]           PATIENT TYPE:   LOCATION:                               FACILITY:  [facility]   PHYSICIAN:  Jonelle Sidle, MD DATE OF BIRTH:  1941-02-13   DATE OF PROCEDURE:  DATE OF DISCHARGE:                              CARDIAC CATHETERIZATION   PRIMARY CARDIOLOGIST:  Dr. Vida Roller.   INDICATIONS:  Ms. Deveney is a 70 year old woman with a history of known  coronary artery disease, status post previous placement of a drug-eluting  stent to the right coronary artery in January 2006.  She has experienced  chest pain and was noted to have an abnormal myocardial perfusion study  demonstrating a reduction in ejection fraction to 31% with possible anterior  wall scar.  She is therefore referred for cardiac catheterization to clearly  assess the coronary anatomy and was admitted to the hospital for  prehydration and bicarbonate protocol due to known renal insufficiency.  The  potential risks and benefits were explained to the patient in advance and  informed consent was obtained.  Baseline creatinine is 1.9.   PROCEDURE PERFORMED:  1. Left heart catheterization.  2. Selective coronary angiography.   ACCESS AND EQUIPMENT:  The area about the right femoral artery was  anesthetized with 1% lidocaine and a 6-French sheath was placed in the right  femoral artery via the modified Seldinger technique.  Standard preformed 6-  [place] JL4 and JR4 catheters were used for selective coronary angiography as  well as left heart catheterization.  All exchanges were made over a wire.  A  total of 40 mL of Omnipaque were used.  No left ventriculography was  performed in an effort to conserve contrast dye.  The patient tolerated  procedure well without any complications.   HEMODYNAMIC RESULTS:  Aorta 146/59 mmHg.  Left ventricle 149/19 mmHg.   ANGIOGRAPHIC FINDINGS:  1. Left main coronary artery gives rise  to left anterior descending and      circumflex coronary arteries and has approximately 20% distal stenosis.  2. The left anterior descending gives rise to the bifurcating high      diagonal branch.  There is calcification in the proximal portion of the      vessel with approximately 30% stenosis as well as 40% to 50% stenosis      involving the proximal portion of the septal perforator.  Within the      mid left anterior descending is a 30% stenosis.  3. The circumflex coronary artery provides 2 obtuse marginal branches and      has approximately 50% stenosis in the proximal portion of the first      obtuse marginal.  4. The right coronary artery is dominant and is calcified proximally with      30% proximal and mid-vessel stenosis.  A stent is visualized within the      midportion of the vessel and is patent.  There is also a distal 20%      stenosis noted.   DIAGNOSIS:  1. Mild coronary atherosclerosis in  the major epicardial vessels with      widely patent drug-eluting stent within the mid right coronary artery.  2. Left ventricle end-diastolic pressure of 19 mmHg.   DISCUSSION:  I reviewed the results with the patient as well as with Dr.  Dorethea Clan.  At this point, we will continue postprocedure hydration with  followup renal function evaluation in the morning.  Otherwise, medical  therapy is planned for her coronary disease.      Jonelle Sidle, MD  Electronically Signed     SGM/MEDQ  D:  12/20/2005  T:  12/21/2005  Job:  [id number]   cc:   Farris Has. Dorethea Clan, MD

## 2010-09-28 NOTE — H&P (Signed)
NAME:  Tiffany [name], Tiffany [name] NO.:  [id number]   MEDICAL RECORD NO.:  [id number]          PATIENT TYPE:  INP   LOCATION:  NA                           FACILITY:  [facility]   PHYSICIAN:  Duke Salvia, M.D.  DATE OF BIRTH:  Jun 19, 1940   DATE OF ADMISSION:  06/07/2004  DATE OF DISCHARGE:                                HISTORY & PHYSICAL   Tiffany [name] is a 70 year old woman with a past medical history notable for  cardiac risk factors including smoking, hypertension, family history,  unknown cholesterol status, and no diabetes, who presents tonight with the  onset of chest pain at 11:45.  This was associated with radiation to the  neck and the left arm and associated with shortness of breath and  diaphoresis.  She presented to the emergency room where electrocardiogram  demonstrated ST segment elevation in the inferior leads with reciprocal  depression in the anterior leads.  She was treated with heparin,  nitroglycerin, 2B3A therapy, and morphine and the pain persisted.  She was  brought via [facility] to [facility] with resolution of pain as she  pulled into the parking deck.   PAST MEDICAL HISTORY:  1.  Her past related history is notable for exercise intolerance with      shortness of breath at less than a flight of stairs.  She does not have      nocturnal dyspnea.  She has occasional peripheral edema.  She has a one-      year history of recurrent abrupt onset/offset tachy palpitations that      are __________  positive and diuretic positive associated with      lightheadedness and shortness of breath, but not syncope.  2.  Anemia.  3.  Renal insufficiency.  4.  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  5.  Salivary gland tumor, status post radiation therapy.  6.  Potassium wasting, bicarb-requiring renal process presumably an RTA.   SOCIAL HISTORY:  She lives with her daughter.  She smokes.  She does not use  alcohol or recreational drugs.   REVIEW OF  SYSTEMS:  Negative for neurological symptoms, constitutional  symptoms.  The rest of it was deferred as she was on the table being prepped  for urgent intervention.   MEDICATIONS:  Bicarb, potassium, Evista, hydrocortisone, and Ambien.  Doses  are unknown.   ALLERGIES:  SULFA.   PHYSICAL EXAMINATION:  VITAL SIGNS:  Initially her blood pressure was 93/53,  it is now 106/58, pulse stable at 75, respirations 16 and unlabored.  HEENT:  No icterus or xanthoma.  The neck veins were 6 to 7 cm. The carotids  were brisk and full bilaterally without bruits.  BACK:  Not examined as she was lying on it.  LUNGS:  Clear laterally.  HEART:  Sounds are regular without murmurs or gallops.  ABDOMEN:  Soft with active bowel sounds without midline pulsations or  hepatomegaly.  Femoral pulses were 2+.  Distal pulses were intact.  There  was no cyanosis, clubbing, or edema.  NEUROLOGY:  Grossly normal.  SKIN:  Warm and  dry.   Electrocardiogram was as noted previously with ST segment elevation of 1 to  2 mm in leads 2, 3, and F and ST segment depression in leads V1 and V2.   LABORATORY DATA:  Creatinine 1.9, hemoglobin 10.9, troponin 0.21.   IMPRESSION:  1.  IP myocardial infarction - acute.  2.  Cardiac risk factors.      1.  Smoking.      2.  Hypertension.      3.  Family history.      4.  Question cholesterol.      5.  No diabetes.  3.  Lupus.  4.  Renal disease.      1.  Creatinine of 1.9.      2.  Potassium wasting/bicarb requiring process, question renal tubular          acidosis.  5.  Anemia.  6.  Tachy palpitations consistent with supraventricular tachycardia.   DISCUSSION:  Tiffany [name] will go emergently to the catheterization lab for  revascularization.  It should be noted that her chest pain had resolved just  prior to arrival.  The other issue will have to do with her tachy  palpitations.  In the event that her LV function is not suggestive of a  substrate for ventricular  tachycardia, this can be presumed to be SCT and  can be followed clinically.  Otherwise, EP testing may be useful to  decide  whether the substrate could house ventricular tachycardia.   Renal consultation will be done in the morning.      SCK/MEDQ  D:  06/07/2004  T:  06/07/2004  Job:  [id number]   cc:   Dr. Freda Jackson, Rema Jasmine C. Lowell Guitar, M.D.  [address]  [place]  [place] [address]  Fax: [phone]   [place]   [facility]
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
## 2010-09-28 NOTE — Procedures (Signed)
NAME:  MARENA, WITTS                ACCOUNT NO.:  [id number]   MEDICAL RECORD NO.:  [id number]          PATIENT TYPE:  EMS   LOCATION:  ED                            FACILITY:  [facility]   PHYSICIAN:  Edward L. Juanetta Gosling, M.D.DATE OF BIRTH:  Aug 10, 1940   DATE OF PROCEDURE:  07/06/2005  DATE OF DISCHARGE:  07/06/2005                                EKG INTERPRETATION   The rhythm is sinus rhythm with rate in the 90's.  The computer has read  acute anterior infarction.  I think this is artifact.  There are Q waves  inferiorly which could indicate a previous inferior infarction and clinical  correlation is suggested.  Abnormal electrocardiogram.      Oneal Deputy. Juanetta Gosling, M.D.  Electronically Signed     ELH/MEDQ  D:  07/08/2005  T:  07/09/2005  Job:  [id number]

## 2010-09-28 NOTE — Discharge Summary (Signed)
NAMECHAVELA, [name]                ACCOUNT NO.:  [id number]   MEDICAL RECORD NO.:  [id number]          PATIENT TYPE:  INP   LOCATION:  2910                         FACILITY:  [facility]   PHYSICIAN:  Charlton Haws, M.D.     DATE OF BIRTH:  02-15-1941   DATE OF ADMISSION:  06/07/2004  DATE OF DISCHARGE:  06/10/2004                                 DISCHARGE SUMMARY   BRIEF HISTORY:  This is a 70 year old female with no previous history of  coronary artery disease.  She does have a history of hypertension and a  positive family history of coronary artery disease.  Her cholesterol status  was unknown.  She has no previous history of diabetes.  She presented for  evaluation of chest pain through the emergency room.  She had ST changes.  She was treated with heparin, nitroglycerin and a 2b3a inhibitor as well as  morphine and her pain persisted.  She initially presented to [facility] Emergency Room, she was transferred to [facility] for  further evaluation.  She was pain-free on her arrival.   PAST MEDICAL HISTORY:  Significant for:  1.  Dyspnea on exertion.  2.  History of anemia.  3.  Renal insufficiency.  4.  Lupus with extensive involvement, although details are unknown.  5.  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  6.  History of potassium wasting.  7.  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  She is followed by Dr. Casimiro Needle for this problem.   ALLERGIES:  SULFA.   MEDICATIONS PRIOR TO ADMISSION:  Included bicarb, potassium Evista,  hydrocortisone and Ambien, doses were not available.   SOCIAL HISTORY:  Patient lives with her daughter.  She smokes, she does not  use alcohol.   HOSPITAL COURSE:  As noted, this patient was admitted to [facility]  in transfer from [facility] emergency room with an acute MI.  A  cardiac catheterization was performed on June 07, 2004, by Dr.  Gerri Spore.  The patient was found to have a 100% occluded RCA with a thrombus.  A drug-  eluting stent was placed, reducing the stenosis from 100% to 0%.  She was  treated with Integrilin.  She was enrolled in the Triton study and she will  be on the Triton study drug for at least one year.  Tobacco cessation was  strongly encouraged.   The patient was noted to be anemic during her stay, she required transfusion  with subsequent improvement.  She had a previous history of renal  insufficiency.  Her BUN and creatinine were monitored carefully during her  stay as was her potassium.  On telemetry, she had some nonsustained  ventricular tachycardia on June 08, 2004, she also had a question of some  supraventricular tachycardia.  She is being treated with metoprolol,  however, she has had some bradycardia and some hypotension and this will  need to be carefully monitored.   Arrangements were made to discharge the  patient in stable and improved  condition on June 10, 2004.   LABORATORY DATA:  On the 29th, a CBC revealed hemoglobin 10.8, hematocrit  31.9, WBCs 4.3 thousand, platelets 134,000.  A chemistry profile on the 29th  revealed BUN 11, creatinine 1.8, potassium 4.4, glucose was 84.  On the  28th, her creatinine had been as high as 2.0.  On the 27th, a CK was 988, MB  was 162, troponin was 51.15.  On the 26th, her CK was 2829, MB was 550, a  troponin was 0.97 although it did climb to 75.49.  Stool for occult blood  was negative.  Iron studies revealed the iron to be low at 28, total iron  binding capacity was low at 244, % saturation was low at 11.  A lipid  profile was performed on the 26th that revealed cholesterol 171,  triglycerides 101, HDL was 44, LDL was 107.  Hemoglobin A1c was 5.2, TSH was  4.788.  A chest x-ray on the 27th showed no acute disease, an INR was 1.0,  PTT was 26.   DISCHARGE MEDICATIONS:  1.  Sodium bicarb 650 mg three times daily.  2.  Potassium 10 mEq  three times daily.  3.  Prevacid as previously taken.  4.  Evista 60 mg daily.  5.  Plaquenil 200 mg twice daily.  6.  She was told to stop taking Zetia, I do not believe she was taking this      prior to admission although she had a prescription.  7.  Metoprolol 25 mg 1/2 tablet b.i.d.  8.  Nitroglycerin p.r.n. for chest pain.  9.  Lipitor 40 mg daily.  10. Triton study drug as instructed.  11. Coated aspirin 81 mg daily.  12. Hydrocortisone as previously taken.  13. Tylenol as needed for pain.   The patient was told to avoid any strenuous activity until seen by her  physician.  She was to be on a low-salt, low-fat diet.  She was told to  observe her cath site for any  bleeding, swelling or pain.  She was told to  quite smoking.  She was to followup with Dr. __________, in [place], as needed  or as instructed.  She would see Dr. Dorethea Clan in the [place] office, the  office will call her for an appointment.   PROBLEM LIST AT THE TIME OF DISCHARGE:  1.  Acute myocardial infarction with percutaneous transluminal coronary      angioplasty stenting of the RCA performed on June 07, 2004, ejection      fraction 53% at time of catheterization, diffuse residual disease,      please see dictated report for full details.  2.  Supraventricular tachycardia and nonsustained ventricular tachycardia      noted this admission.  3.  Triton study.  4.  History of renal insufficiency as well as some type of potassium-wasting      renal process, question renal tubular acidosis.  5.  History of anemia requiring transfusion this admission.  6.  History of severe lupus.  7.  History of salivary gland tumor, status post radiation therapy.  8.  Previous tobacco history.  9.  Hypertension.  10. Bradycardia.  11. Low iron stores as well as low total iron binding capacity.  12. Dyslipidemia.      DR/MEDQ  D:  06/10/2004  T:  06/10/2004  Job:  [id number]  cc:   Marilu Favre, Dr.   Mindi Slicker. Lowell Guitar, M.D.   [address]  [address]  [place]  [place] [address]  Fax: [phone]

## 2010-09-28 NOTE — Op Note (Signed)
. [facility]  Patient:    Tiffany [name], Tiffany [name]                         MRN: [id number] Proc. Date: 09/07/99 Adm. Date:  [id number] Attending:  Lucky Cowboy CC:         [facility]             Colon Branch, M.D.                           Operative Report  PREOPERATIVE DIAGNOSIS:  Left submandibular mass.  POSTOPERATIVE DIAGNOSIS:  Left submandibular mass.  PROCEDURE:  Excision of left submandibular gland, nerve integrity monitor.  SURGEON:  Lucky Cowboy, M.D.  ASSISTANT:  Zola Button T. Lazarus Salines, M.D.  ANESTHESIA:  General endotracheal anesthesia.  ESTIMATED BLOOD LOSS:  20 cc.  SPECIMENS:  Left submandibular gland with mass.  COMPLICATIONS:  None.  INDICATIONS:  This patient is a 70 year old female with a 65-month history of a left submandibular mass.  Preoperative FNA revealed lymphoid tissue with no abnormality.  Patient was noted to have a mass in a submandibular gland along the lateral portion by CT scan.  FINDINGS:  Patient was noted to have approximately a 1.5 x 1.5 cm mass lateral, but attached to the submandibular gland.  It was somewhat dark purple in appearance.  It did not appear to be vascular.  It was nodular and very hard.  The facial nerve stimulated with 0.5 mA of current after the dissection of the case.  PROCEDURE:  The patient was taken to the operating room and placed on the table in the supine position.  She was then placed under general endotracheal anesthesia and the table rotated clockwise 90 degrees.  The nerve integrity monitor was then applied in the orbicularis oris.  Resistance was 0.2 milli ohms.  At this point, the neck was gently extended using a shoulder roll and the left lower face and neck prepped with Betadine and draped in the usual sterile fashion.  At this point, a marking pen was used to outline a transverse incision 2 fingerbreadths below the inferior border of the mandible and a total extent  of approximately 6 cm.  A #15 blade was used to incise the skin.  Subcutaneous tissues and platysma muscle was divided using Bovie electrocautery.  Flaps were then elevated using Bovie electrocautery.  At this point, the nerve was then dissected.  Fat was dissected off of the region just inferior to the mandible.  Nerve integrity monitor was used to stimulate in the region as there was excessive fat in the area.  Successive dissection and identified the marginal mandibular nerve anteriorly, where it crossed superiorly across the mandible.  It was then traced posteriorly and protected in its entire extent.  It was then reflected superiorly.  The mass was then identified and detached from its detachments of the mandible.  Dissection continued deep and then posteriorly releasing the attachments from the posterior border of the digastric, digastric tendon and submental fat.  The facial artery was identified, doubly clamped and tied with 3-0 silk suture.  A facial artery was also identified, clamped and tied with 3-0 silk suture.  The hypoglossal nerve was identified and protected.  The mylohyoid muscle was reflected anteriorly and the lingual nerve identified.  The gland was separated from Langleys ganglion.  The lingual nerve was left protected. Whartons duct was identified,  clamped and tied with 3-0 silk suture.  The gland was thus removed and sent to pathology.  The wound was copiously irrigated with normal saline.  A drain was placed in the depths of the wound and brought out through the incision.  This was a #7 Synder drain, which was then attached to bulb suction.  Bovie and bipolar electrocautery were used for hemostasis during the case.  Three-0 silk suture ties were also used for hemostasis.  The platysma was reapproximated simple, interrupted, buried fashion using 3-0 Vicryl.  Skin was closed with staples.  Drain was secured to the neck in a simple interrupted fashion using 3-0 nylon.   Bacitracin ointment was placed over the wound and the drain further secured to the left upper chest using pink tape.  Table was then rotated clockwise 90 degrees to its original position.  Patient was awakened from anesthesia and extubated in the operating room.  She was taken to the post anesthesia care unit in stable condition.  There are no complications. DD:  09/07/99 TD:  09/08/99 Job: [id number]

## 2010-09-28 NOTE — Op Note (Signed)
NAME:  Tiffany [name], Tiffany [name]                          ACCOUNT NO.:  [id number]   MEDICAL RECORD NO.:  [id number]                   PATIENT TYPE:  AMB   LOCATION:  NESC                                 FACILITY:  [facility]   PHYSICIAN:  Bertram Millard. Dahlstedt, M.D.          DATE OF BIRTH:  [birth year]   DATE OF PROCEDURE:  03/23/2002  DATE OF DISCHARGE:                                 OPERATIVE REPORT   PREOPERATIVE DIAGNOSIS:  Left ureteral calculus with hydronephrosis.   POSTOPERATIVE DIAGNOSIS:  Left ureteral calculus with hydronephrosis.   PROCEDURES:  Cystoscopy, left retrograde ureteropyelogram, ureteroscopy with  holmium laser lithotripsy of left ureteral calculus, double J stent  placement, fluoroscopic interpretations.   SURGEON:  Bertram Millard. Dahlstedt, M.D.   ANESTHESIA:  General with LMA.   COMPLICATIONS:  None.   BRIEF HISTORY:  A 70 year old female who presents for ureteroscopy and  holmium laser lithotripsy of a left distal ureteral stone.  She was sent in  by Dr. Casimiro Needle for evaluation and management of left hydronephrosis,  noted on a renal ultrasound.  She has mild renal insufficiency.  She also  has a history of renal tubular acidosis and has had prior stones and stone  procedures done before.   She is aware of the risks and complications of the surgical procedure of  ureteroscopy.  She desires to proceed.   DESCRIPTION OF PROCEDURE:  The patient was administered IV antibiotics in  the holding area and taken to the operating room, where a general anesthetic  was administered using the LMA.  She was placed in the dorsal lithotomy  position, genitalia and perineum were prepped and draped.  A 22 French  panendoscope was placed into her bladder, which was found to be normal.  The  left ureter was cannulated and a retrograde performed showing a filling  defect in the distal ureter approximately 5-6 cm up from the bladder.  A  guidewire was advanced by the stone and a  ureteroscope was advanced up to  the stone.  There was a significant inflammatory reaction around the stone  with a probable stricture.  The stone was fragmented into multiple smaller  fragments using the holmium laser at a power of 0.6.  Adequate fragmentation  was achieved, and I was easily able to traverse the area where the stone was  impacted.  No proximal stones were seen.  I did not extract any stones;  these were left to pass spontaneously.  A double J stent, 24 cm x 6 [place],  was placed over the guidewire using fluoroscopic and cystoscopic guidance.  Good curls were seen proximally and distally.  The bladder was drained and  the procedure terminated.  A B&O suppository was placed.   The patient tolerated the procedure well.  She was awakened, taken to the  PACU in stable condition.   She was discharged on Macrobid one p.o. q.d. and Vicodin one to  two p.o.  q.4h. p.r.n. pain.  She was also given oxybutynin for bladder spasms.  She  will follow up in my office in approximately one week for KUB and possible  stent extraction.                                               Bertram Millard. Dahlstedt, M.D.    SMD/MEDQ  D:  03/23/2002  T:  03/23/2002  Job:  [id number]   cc:   Mindi Slicker. Lowell Guitar, M.D.  [address]  [place]  [place] [address]  Fax: [phone]   Areatha Keas, M.D.  [address]  [address]  [place]  [place] [address]  Fax: [phone]

## 2010-09-28 NOTE — Consult Note (Signed)
NAMENIEVES, CHAPA                ACCOUNT NO.:  [id number]   MEDICAL RECORD NO.:  [id number]          PATIENT TYPE:  INP   LOCATION:  2910                         FACILITY:  [facility]   PHYSICIAN:  Lionel December, M.D.    DATE OF BIRTH:  [birth year]   DATE OF CONSULTATION:  08/09/2004  DATE OF DISCHARGE:  06/10/2004                                   CONSULTATION   REASON FOR CONSULTATION:  Anemia, presumed to be due to iron deficiency.   HISTORY OF PRESENT ILLNESS:  Tiffany [name] is a 70 year old Caucasian female who was  admitted to [facility] about two months ago with myocardial  infarct.  She had PTCA with stenting to right coronary artery on June 07, 2004.  She had an ejection fraction of 53%.  During that hospitalization,  she was noted to be anemic.  She required 2 units of PRBCs.  Her Hemoccult  was negative.  Iron studies revealed serum iron of 28, TRBC of 244 and  saturation was 11%.  At the time of discharge, she was therefore advised  workup on an outpatient basis.  Her H&H on June 10, 2004, was 10.8 and  31.9.  MCV was 93.0.  The patient feels a lot better.  She denies melena,  rectal bleeding or recent change in her bowel habits.  She also denies  nausea, vomiting, heartburn or dysphagia.  She had her esophagus stretched  twice in 2002 with good results.  She had paroxysmal esophageal stricture.  She states she has lost 13 pounds in two months.  She feels that may be  related to her cardiac illness.  However, she has not experienced any chest  pain or dyspnea.  She has a tendency to have constipation.  She denies  abdominal pain.  Her last colonoscopy was in March 2003 for screening  purposes.  She had single small polyp in the rectum which was inflammatory.  She denies postural symptoms.  She also denies vaginal bleeding or  hematuria.   MEDICATIONS:  1.  Prevacid 30 mg p.o. q.a.m.  2.  Klor-Con 10 mEq t.i.d.  3.  Sodium bicarbonate 650 mg t.i.d.  4.  Evista 60 mg  daily.  5.  Plaquenil 200 mg b.i.d.  6.  Ambien 10 mg q.h.s.  7.  Metoprolol 25 mg b.i.d.  8.  Lipitor 40 mg daily.  9.  Plavix 75 mg daily.  10. ASA 81 mg daily.  11. Tylenol and nitroglycerin p.r.n.   PAST MEDICAL HISTORY:  1.  Lupus.  This was diagnosed about four years ago.  2.  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  She had radiation therapy with good      results.  She has remained in remission.  3.  History of depression, presently on no therapy.  4.  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  5.  History of alcoholic hepatitis and hepatomegaly for which she was      admitted to [facility] about 8 or  9 years ago.  She has not had any alcohol      since then which would be probably 8 years ago.  6.  Hyperlipidemia.   PAST SURGICAL HISTORY:  1.  Bilateral cataract extraction done 15-20 years ago.  2.  Removal of benign lump from left breast about three years ago.  3.  Cholecystectomy about three years ago.   STUDIES:  EGD and colonoscopies as above.  She has chronic GERD.  Her EGD in  November 2002 revealed cervical esophageal stricture which was dilated to 4  [place].  She had erosive esophagitis and a small sliding hiatal hernia.   ALLERGIES:  SULFA causes rash.   FAMILY HISTORY:  Both parents are deceased secondary to coronary artery  disease.  Father was 70 and mother was 66.  Mother also had cirrhosis  secondary to autoimmune hepatitis and had been under my care for several  years.  She has a sister who has been treated for breast carcinoma in her  23s and is in remission at age 71.  She has another sister and four brothers  in good health.   SOCIAL HISTORY:  She is divorced, she has 3 children.  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  She has been smoking for  about 18 years and now trying to quit, and now she is down to three  cigarettes per day.  She  has not had any alcohol in about 8 years.   PHYSICAL EXAMINATION:  GENERAL APPEARANCE:  Pleasant, well-developed, thin,  Caucasian female who is in no acute distress.  VITAL SIGNS:  She weighs 177 pounds.  She is 5 feet 11-1/2 inches tall.  Pulse 60, blood pressure 120/70, temperature 97.9.  HEENT:  Conjunctivae pink, sclerae nonicteric.  Oropharyngeal mucosa is dry.  She has upper dentures in place.  NECK:  No neck masses or thyromegaly noted.  CARDIAC:  Regular rhythm.  Normal S1, S2.  No murmur or gallops noted.  LUNG:  Clear to auscultation.  ABDOMEN:  Flat, bowel sounds are normal.  Palpation reveals soft abdomen  without tenderness, organomegaly or masses.  RECTAL:  Deferred.  EXTREMITIES:  She does not have clubbing, koilonychia or peripheral edema.   LABORATORY DATA:  Recent labs, as above.   ASSESSMENT:  Tiffany [name] is a 70 year old Caucasian female who was recently found  to have anemia with normal MCV while she was hospitalized at [facility] for cardiac problems.  Her Hemoccults were negative.  She had  received two units of packed red blood cells.  Her serum iron, TRBC and  saturation were all low as above.  She really does not have any  gastrointestinal symptoms.  She has chronic gastroesophageal reflux disease,  but symptoms are well controlled with Prevacid.  She has been on proton pump  inhibitor for a few years, and therefore, could have impaired iron  absorption.  Similarly, she does not have history of diarrhea to suggest  malabsorptive syndrome.  She has lost 13 pounds, but this may be related to  her hospitalization, etc.  Her last colonoscopy was three years ago, and EGD  was done in the prior year.  If, indeed, she has iron deficiency anemia, I  would recommend repeating her colonoscopy.  If she has heme-positive stools  and colonoscopy is normal, then would consider examining her upper GI tract  as well as small bowel.   RECOMMENDATIONS: 1.  Hemoccult  x3.  2.  Will check her CBC, serum iron, TRBC, ferritin, B12 and folate levels      today.  3.  Further recommendations will be made as soon as lab studies are      reviewed.  Since she had myocardial infarction two months ago, it may      not be a bad idea to wait another four weeks or so before endoscopic      evaluation undertaken.   I would like to thank Dr. Charlton Haws for the opportunity to participate in  the care of this nice lady.      NR/MEDQ  D:  08/09/2004  T:  08/09/2004  Job:  [id number]   cc:   Jonelle Sidle, M.D. [facility]   Dr. Virgina Organ

## 2010-09-28 NOTE — Op Note (Signed)
NAME:  Tiffany [name], Tiffany [name]                ACCOUNT NO.:  [id number]   MEDICAL RECORD NO.:  [id number]          PATIENT TYPE:  AMB   LOCATION:  SDS                          FACILITY:  [facility]   PHYSICIAN:  Alford Highland. Rankin, M.D.   DATE OF BIRTH:  [birth year]   DATE OF PROCEDURE:  09/08/2006  DATE OF DISCHARGE:                               OPERATIVE REPORT   PREOPERATIVE DIAGNOSIS:  1. Rhegmatogenous detachment, right eye, with posterior retinal break.  2. Moderate dense vitreous hemorrhage, right eye, secondary to #1.   POSTOPERATIVE DIAGNOSES:  1. Rhegmatogenous detachment, right eye, with posterior retinal break.  2. Moderate dense vitreous hemorrhage, right eye, secondary to #1.  3. Epiretinal membrane, right eye.   PROCEDURE:  1. Posterior vitrectomy with membrane peel, right eye, using active      suction and removal of epiretinal membrane and tractional tissue      around the posterior retinal break with what appeared to be      asteroid.  2. Endolaser pan-photocoagulation, right eye, retinopexy fashion.  3. Injection of vitreous substitute, C3F8 10%.   SURGEON:  Alford Highland. Rankin, M.D.   ANESTHESIA:  Local retrobulbar anesthesia control.   INDICATIONS FOR PROCEDURE:  The patient is a 70 year old woman with  profound vision loss on the basis of rhegmatogenous detachment.  The  patient understands this is an attempt to reattach retina.  She  understands the risks of anesthesia including the rare occurrence of  death, loss of the eye, including but not limited to hemorrhage,  infection, scarring, need for further surgery, no change in vision, loss  of vision, and progression of disease despite intervention.  Appropriate  signed consent was obtained and the patient was taken to the operating  room.   In the operating room, appropriate monitoring was followed by mild  sedation.  Marcaine 0.75% delivered 5 mL retrobulbar followed by an  additional 5 mL laterally in a fashion modified Darel Hong.  The right  periocular region was sterilely prepped and draped in the usual  ophthalmic fashion.  A lid speculum applied.  Twenty-five gauge trocar  system was then used to place the infusion cannula in the inferotemporal  quadrant.  A superior trocars were applied.  Core vitrectomy was then  begun.  The posterior hyaloid was identified and this was removed and  then the vitreous circumscribed 360 degrees.  Tractional tissues along  the horseshoe retinal break superotemporally were identified.  This  appeared near the equator.  These were amputated.   Epiretinal membrane was also identified.  This was removed also with  extrusion needle which mobilizes the tissue without difficulty around  the macula region.  This was done temporal to the foveal region.   At this time, fluid/fluid exchange was done to remove the thick  subretinal fluid.  A retinotomy was fashioned along the inferotemporal  arcade and temporal to the area so as to drain the most dependent part  of fluid posteriorly.   Fluid/fluid exchange carried out with extrusion needle.  Fluid exchange  carried out and also retinal fluid was removed  in this fashion.  An  endolaser photocoagulation placed along the bed of detachment and  extended in the retinal periphery from the 7 o'clock area to the 12:30  position.   Endolaser photocoagulation placed around the rest of the break, as well  as a retinotomy.   At this time, the fluid/air exchange completed again.  C3F8 10% exchange  completed.  The superior trocars were removed.  The infusion removed.   Subconjunctival injection of Decadron applied.  Sterile patch and Fox  shield applied.  The patient tolerated the procedure without  complication.      Alford Highland Rankin, M.D.  Electronically Signed     GAR/MEDQ  D:  09/08/2006  T:  09/08/2006  Job:  [id number]

## 2010-09-28 NOTE — Op Note (Signed)
[place]. [facility]  Patient:    Tiffany [name], Tiffany [name]                         MRN: [id number] Proc. Date: 09/21/99 Adm. Date:  [id number] Disc. Date: [id number] Attending:  Lucky Cowboy CC:         [facility], Nose, and Throat             Wallie Char, M.D.             Wynn Banker, M.D.                           Operative Report  PREOPERATIVE DIAGNOSIS:  Metastatic left zone 2 lymph node, squamous cell carcinoma with unknown primary.  POSTOPERATIVE DIAGNOSIS:  Metastatic left zone 2 lymph node, squamous cell carcinoma with unknown primary.  PROCEDURES: 1. Left tonsillectomy. 2. Direct laryngoscopy. 3. Biopsy of left lateral wall piriform sinus wall. 4. Esophagoscopy.  SURGEON:  Lucky Cowboy, M.D.  ASSISTANT:  ANESTHESIA:  General.  ESTIMATED BLOOD LOSS:  50 cc.  SPECIMENS:  Left tonsil and left lateral piriform sinus wall biopsy.  COMPLICATIONS:  None.  INDICATIONS:  This patient is a 70 year old who underwent excision of left submandibular gland two weeks ago.  Findings on the pathology revealed a metastatic squamous cell carcinoma of an overlying submandibular lymph node. The patient has been examined by fiberoptic exam in the office without any known primary being found.  For this reason, the above procedures are performed.  FINDINGS:  The patient was noted to have a normal esophageal exam.  The patient had a very small amount of granularity on the left lateral piriform sinus wall near the entry.  This was somewhat friable on placement of the laryngoscope.  For this reason, it was biopsied.  The tongue base, contralateral piriform sinus, endolarynx, hypopharynx, including post cricoid areas were without abnormality.  The oropharynx was without abnormality.  The left tonsil was removed, but was grossly palpated to not contain any mass.  DESCRIPTION OF PROCEDURE:  The patient was taken to the operating room and placed on the table in  the supine position.  She was then placed under general endotracheal anesthesia and the table rotated counterclockwise 90 degrees. The neck was extended using a shoulder roll.  The head and body were sterilely draped.  The eyes had been taped shut.  Bacitracin ointment was placed on the lips.  A Jesberg esophagoscope was then passed in the post cricoid area and while visualizing the lumen at all times, passed into the stomach.  No mucosal irregularities were identified.  The esophagoscope was then removed and the anterior commissure Holinger laryngoscope placed intraorally with examination of the oropharynx, hypopharynx, and endolarynx.  There were no abnormalities identified with the exception of a small area of granularity on the left lateral piriform sinus wall.  For this reason, small cup forceps were used to obtain a biopsy of this region.  The scope was then removed and the oral cavity open using a Crowe-Davis mouth gag with a #3 tongue blade.  The left tonsil was then removed.  The left tonsil was grasped with Allis clamps and reflected inferomedially.  A #12 blade was used to make an incision along the anterior tonsillar pillar.  ______ used to dissect the tonsil out to its inferolateral attachment.  A snare was used to resect the tonsil and two sterile gauze packs  placed.   Packs were removed after allowing five minutes for hemostasis.  Poor hemostasis was achieved using suction cautery.  The mouth gag was relaxed and reopened, noting no residual bleeding.  An NG tube was passed down the esophagus for suction of the gastric contents.  The mouth gag was removed, noting no damage to the soft tissues.  The table was rotated clockwise 90 degrees to its original position.  The patient was awaken from anesthesia and extubated in the operating room.  She was taken to the post anesthesia unit in stable condition.  There were no complications. DD:  09/21/99 TD:  09/24/99 Job:  [id number]

## 2010-10-04 DIAGNOSIS — M3500 Sicca syndrome, unspecified: Secondary | ICD-10-CM

## 2010-10-09 ENCOUNTER — Ambulatory Visit: Payer: MEDICARE | Admitting: Cardiology

## 2010-10-22 ENCOUNTER — Ambulatory Visit (INDEPENDENT_AMBULATORY_CARE_PROVIDER_SITE_OTHER): Payer: PRIVATE HEALTH INSURANCE | Admitting: Adult Health

## 2010-10-22 ENCOUNTER — Encounter: Payer: Self-pay | Admitting: Adult Health

## 2010-10-22 DIAGNOSIS — R55 Syncope and collapse: Secondary | ICD-10-CM

## 2010-10-22 DIAGNOSIS — I2510 Atherosclerotic heart disease of native coronary artery without angina pectoris: Secondary | ICD-10-CM

## 2010-10-22 DIAGNOSIS — Z7189 Other specified counseling: Secondary | ICD-10-CM

## 2010-10-22 DIAGNOSIS — I951 Orthostatic hypotension: Secondary | ICD-10-CM

## 2010-10-22 NOTE — Progress Notes (Signed)
HPI:  This is a 70 year old white female patient with history of orthostatic hypotension most recently placed on Florinef. She states she is doing much better since the Florinef has been added and she's increased her salt and fluid intake. She is able to walk with a walker and has very few episodes of dizziness. She is able to catch herself and sit down before she falls. She denies any frank syncope. She unfortunately continues to smoke. She is eating well and maintaining her wt.  She wears TED hose as directed.     Allergies  Allergen Reactions  . Sulfonamide Derivatives       Current Outpatient Prescriptions  Medication Sig Dispense Refill  . ALPRAZolam (XANAX) 0.5 MG tablet Take 0.5 mg by mouth at bedtime as needed.        Marland Kitchen amitriptyline (ELAVIL) 100 MG tablet Take 100 mg by mouth at bedtime.        Marland Kitchen aspirin 81 MG tablet Take 81 mg by mouth daily.        Marland Kitchen atorvastatin (LIPITOR) 40 MG tablet Take 40 mg by mouth daily.        Marland Kitchen azaTHIOprine (IMURAN) 50 MG tablet Take 50 mg by mouth 2 (two) times daily.        . Cholecalciferol (VITAMIN D) 400 UNITS capsule Take 400 Units by mouth daily.        . cycloSPORINE (RESTASIS) 0.05 % ophthalmic emulsion Place 1 drop into both eyes 2 (two) times daily.        . fludrocortisone (FLORINEF) 0.1mg /mL SUSP Take 0.1 mg by mouth 2 (two) times daily.        . fluorometholone (EFLONE) 0.1 % ophthalmic suspension Place 1 drop into both eyes 2 (two) times daily.        . folic acid (FOLVITE) 1 MG tablet Take 1 mg by mouth daily.        Marland Kitchen HYDROcodone-acetaminophen (LORTAB) 10-500 MG per tablet Take 1 tablet by mouth every 6 (six) hours as needed.        . hydroxychloroquine (PLAQUENIL) 200 MG tablet Take 200 mg by mouth daily.        Marland Kitchen levothyroxine (SYNTHROID, LEVOTHROID) 25 MCG tablet Take 25 mcg by mouth daily.        . midodrine (PROAMATINE) 10 MG tablet Take 10 mg by mouth 3 (three) times daily.        . Multiple Vitamins-Minerals (MULTIVITAMIN  WITH MINERALS) tablet Take 1 tablet by mouth daily.        . nitroGLYCERIN (NITROSTAT) 0.4 MG SL tablet Place 0.4 mg under the tongue every 5 (five) minutes as needed.        . Omega-3 Fatty Acids (FISH OIL) 1000 MG CAPS Take 1 capsule by mouth daily.        Marland Kitchen omeprazole (PRILOSEC) 20 MG capsule Take 20 mg by mouth daily.        . potassium chloride SA (K-DUR,KLOR-CON) 20 MEQ tablet Take 20 mEq by mouth 2 (two) times daily.        . sodium bicarbonate 325 MG tablet Take 325 mg by mouth daily.        . sodium chloride (MURO 128) 5 % ophthalmic solution 1 drop 4 (four) times daily.        Marland Kitchen triamcinolone (KENALOG) 0.5 % cream Apply topically 3 (three) times daily.        Marland Kitchen DISCONTD: amitriptyline (ELAVIL) 10 MG tablet Take 10 mg by mouth at  bedtime.       Marland Kitchen DISCONTD: Polyethyl Glycol-Propyl Glycol (LUBRICANT EYE DROPS) 0.4-0.3 % SOLN Apply 1 drop to eye 4 (four) times daily.             Past Medical History  Diagnosis Date  . Lymphoma   . Lupus   . Depression   . Hypertension   . Hyperlipidemia   . Coronary artery disease     Imdur 19 2006 treated with stent to the RCA relook in 2007 stent was patent  . Sjogren's syndrome   . Orthostatic hypotension   . Dysphagia   . Syncope   . Tobacco abuse   . Weight loss       Past Surgical History  Procedure Date  . Left breast biopsy   . Eye surgery   . Left breast biopsy   . Nodule removed due to lymphoma       History   Social History  . Marital Status: Divorced    Spouse Name: N/A    Number of Children: N/A  . Years of Education: N/A   Occupational History  . disabled    Social History Main Topics  . Smoking status: Current Everyday Smoker -- 0.5 packs/day for 23 years    Types: Cigarettes  . Smokeless tobacco: Not on file  . Alcohol Use: No  . Drug Use: No  . Sexually Active: Not on file   Other Topics Concern  . Not on file   Social History Narrative  . No narrative on file    Review of Systems Review of  systems complete and found to be negative unless listed above  PHYSICAL EXAM Well-nournished, in no acute distress. Neck: No JVD, HJR, Bruit, or thyroid enlargement Lungs: No tachypnea, clear without wheezing, rales, or rhonchi Cardiovascular: RRR, PMI not displaced, heart sounds normal, no murmurs, gallops, bruit, thrill, or heave. Abdomen: BS normal. Soft without organomegaly, masses, lesions or tenderness. Extremities: without cyanosis, clubbing or edema. Good distal pulses bilateral SKin: Warm, no lesions or rashes  Musculoskeletal: No deformities Neuro: no focal signs  BP 149/88  Pulse 90  Ht 5\' 10"  (1.778 m)  Wt 134 lb (60.782 kg)  BMI 19.23 kg/m2  SpO2 94%    ASSESSMENT AND PLAN:

## 2010-10-22 NOTE — Patient Instructions (Signed)
Your physician recommends that you continue on your current medications as directed. Please refer to the Current Medication list given to you today.  Your physician recommends that you schedule a follow-up appointment in: 6 months  

## 2010-10-22 NOTE — Assessment & Plan Note (Signed)
She is without complaint.  She is medically compliant, no episodes of syncope as described above.  She will continue on current medication regimen without changes.  She will be seen in 6 months unless symptomatic,.  Continue to wear TED hose.

## 2010-10-22 NOTE — Assessment & Plan Note (Signed)
Stable

## 2010-10-26 ENCOUNTER — Encounter: Payer: Self-pay | Admitting: Adult Health

## 2010-11-05 ENCOUNTER — Other Ambulatory Visit: Payer: Self-pay | Admitting: *Deleted

## 2010-11-05 MED ORDER — FLUDROCORTISONE ACETATE 0.1 MG PO TABS: 0.1000 mg | ORAL_TABLET | Freq: Two times a day (BID) | ORAL | Status: DC

## 2011-02-19 LAB — URINALYSIS, ROUTINE W REFLEX MICROSCOPIC
Glucose, UA: NEGATIVE
Hgb urine dipstick: NEGATIVE
Specific Gravity, Urine: 1.015
Urobilinogen, UA: 0.2
pH: 6.5

## 2011-02-19 LAB — PROTIME-INR
INR: 1.1
Prothrombin Time: 14.2

## 2011-02-19 LAB — URINE CULTURE: Colony Count: 25000

## 2011-02-19 LAB — TYPE AND SCREEN: Antibody Screen: POSITIVE

## 2011-02-19 LAB — BASIC METABOLIC PANEL
CO2: 24
Calcium: 8.6
Chloride: 107
Creatinine, Ser: 1.83 — ABNORMAL HIGH
GFR calc Af Amer: 28 — ABNORMAL LOW
GFR calc Af Amer: 34 — ABNORMAL LOW
GFR calc non Af Amer: 28 — ABNORMAL LOW
Sodium: 136
Sodium: 138

## 2011-02-19 LAB — OCCULT BLOOD X 1 CARD TO LAB, STOOL
Fecal Occult Bld: NEGATIVE
Fecal Occult Bld: NEGATIVE

## 2011-02-19 LAB — DIFFERENTIAL
Basophils Absolute: 0
Basophils Relative: 0
Basophils Relative: 0
Basophils Relative: 1
Eosinophils Absolute: 0.2
Eosinophils Relative: 4
Lymphocytes Relative: 27
Lymphs Abs: 1
Lymphs Abs: 1
Monocytes Absolute: 0.4
Monocytes Absolute: 0.4
Monocytes Absolute: 0.5
Monocytes Relative: 11
Monocytes Relative: 9
Neutro Abs: 2
Neutro Abs: 2.1
Neutro Abs: 3.3
Neutrophils Relative %: 57
Neutrophils Relative %: 67

## 2011-02-19 LAB — CBC
HCT: 32.8 — ABNORMAL LOW
Hemoglobin: 10.8 — ABNORMAL LOW
Hemoglobin: 11 — ABNORMAL LOW
Hemoglobin: 11 — ABNORMAL LOW
MCHC: 33.4
MCHC: 33.8
MCV: 93.2
Platelets: 185
RBC: 3.43 — ABNORMAL LOW
RBC: 3.51 — ABNORMAL LOW
RDW: 14.8
WBC: 3.6 — ABNORMAL LOW
WBC: 5

## 2011-02-19 LAB — IRON AND TIBC
Saturation Ratios: 17 — ABNORMAL LOW
TIBC: 267

## 2011-02-19 LAB — FERRITIN: Ferritin: 76 (ref 10–291)

## 2011-02-19 LAB — RETICULOCYTES: RBC.: 3.42 — ABNORMAL LOW

## 2011-03-15 ENCOUNTER — Other Ambulatory Visit: Payer: Self-pay | Admitting: Adult Health

## 2011-03-15 ENCOUNTER — Other Ambulatory Visit (HOSPITAL_COMMUNITY)
Admission: RE | Admit: 2011-03-15 | Discharge: 2011-03-15 | Disposition: A | Discharge status: Home / Self Care | Payer: PRIVATE HEALTH INSURANCE | Source: Ambulatory Visit | Attending: Obstetrics and Gynecology | Admitting: Obstetrics and Gynecology

## 2011-03-15 DIAGNOSIS — R1310 Dysphagia, unspecified: Secondary | ICD-10-CM

## 2011-03-15 DIAGNOSIS — E039 Hypothyroidism, unspecified: Secondary | ICD-10-CM

## 2011-03-15 DIAGNOSIS — Z01419 Encounter for gynecological examination (general) (routine) without abnormal findings: Secondary | ICD-10-CM | POA: Insufficient documentation

## 2011-03-25 ENCOUNTER — Ambulatory Visit (HOSPITAL_COMMUNITY)
Admission: RE | Admit: 2011-03-25 | Discharge: 2011-03-25 | Disposition: A | Discharge status: Home / Self Care | Payer: PRIVATE HEALTH INSURANCE | Source: Ambulatory Visit | Attending: Adult Health | Admitting: Adult Health

## 2011-03-25 DIAGNOSIS — R1310 Dysphagia, unspecified: Secondary | ICD-10-CM | POA: Insufficient documentation

## 2011-03-25 DIAGNOSIS — E039 Hypothyroidism, unspecified: Secondary | ICD-10-CM

## 2011-04-13 ENCOUNTER — Emergency Department (HOSPITAL_COMMUNITY): Payer: PRIVATE HEALTH INSURANCE

## 2011-04-13 ENCOUNTER — Encounter (HOSPITAL_COMMUNITY): Payer: Self-pay

## 2011-04-13 ENCOUNTER — Emergency Department (HOSPITAL_COMMUNITY)
Admission: EM | Admit: 2011-04-13 | Discharge: 2011-04-14 | Disposition: A | Discharge status: Home / Self Care | Payer: PRIVATE HEALTH INSURANCE | Attending: Emergency Medicine | Admitting: Emergency Medicine

## 2011-04-13 DIAGNOSIS — E785 Hyperlipidemia, unspecified: Secondary | ICD-10-CM | POA: Insufficient documentation

## 2011-04-13 DIAGNOSIS — F329 Major depressive disorder, single episode, unspecified: Secondary | ICD-10-CM | POA: Insufficient documentation

## 2011-04-13 DIAGNOSIS — R0602 Shortness of breath: Secondary | ICD-10-CM | POA: Insufficient documentation

## 2011-04-13 DIAGNOSIS — R079 Chest pain, unspecified: Secondary | ICD-10-CM | POA: Insufficient documentation

## 2011-04-13 DIAGNOSIS — M329 Systemic lupus erythematosus, unspecified: Secondary | ICD-10-CM | POA: Insufficient documentation

## 2011-04-13 DIAGNOSIS — I10 Essential (primary) hypertension: Secondary | ICD-10-CM | POA: Insufficient documentation

## 2011-04-13 DIAGNOSIS — F3289 Other specified depressive episodes: Secondary | ICD-10-CM | POA: Insufficient documentation

## 2011-04-13 DIAGNOSIS — R112 Nausea with vomiting, unspecified: Secondary | ICD-10-CM | POA: Insufficient documentation

## 2011-04-13 DIAGNOSIS — F17200 Nicotine dependence, unspecified, uncomplicated: Secondary | ICD-10-CM | POA: Insufficient documentation

## 2011-04-13 DIAGNOSIS — Z87898 Personal history of other specified conditions: Secondary | ICD-10-CM | POA: Insufficient documentation

## 2011-04-13 DIAGNOSIS — M3500 Sicca syndrome, unspecified: Secondary | ICD-10-CM | POA: Insufficient documentation

## 2011-04-13 DIAGNOSIS — I2510 Atherosclerotic heart disease of native coronary artery without angina pectoris: Secondary | ICD-10-CM | POA: Insufficient documentation

## 2011-04-13 DIAGNOSIS — R42 Dizziness and giddiness: Secondary | ICD-10-CM | POA: Insufficient documentation

## 2011-04-13 DIAGNOSIS — I498 Other specified cardiac arrhythmias: Secondary | ICD-10-CM | POA: Insufficient documentation

## 2011-04-13 DIAGNOSIS — Z79899 Other long term (current) drug therapy: Secondary | ICD-10-CM | POA: Insufficient documentation

## 2011-04-13 LAB — BASIC METABOLIC PANEL
CO2: 22 mEq/L (ref 19–32)
Calcium: 9.6 mg/dL (ref 8.4–10.5)
GFR calc Af Amer: 25 mL/min — ABNORMAL LOW (ref >90)
Sodium: 129 mEq/L — ABNORMAL LOW (ref 135–145)

## 2011-04-13 LAB — POCT I-STAT TROPONIN I: Troponin i, poc: 0.03 ng/mL (ref 0.00–0.08)

## 2011-04-13 LAB — CBC
MCH: 31.8 pg (ref 26.0–34.0)
Platelets: 150 10*3/uL (ref 150–400)
RBC: 3.33 MIL/uL — ABNORMAL LOW (ref 3.87–5.11)
RDW: 13.5 % (ref 11.5–15.5)
WBC: 3.8 10*3/uL — ABNORMAL LOW (ref 4.0–10.5)

## 2011-04-13 LAB — PRO B NATRIURETIC PEPTIDE: Pro B Natriuretic peptide (BNP): 562.5 pg/mL — ABNORMAL HIGH (ref 0–125)

## 2011-04-13 MED ORDER — TECHNETIUM TO 99M ALBUMIN AGGREGATED
5.0000 | Freq: Once | INTRAVENOUS | Status: AC | PRN
  Administered 2011-04-13: 5.2 via INTRAVENOUS

## 2011-04-13 MED ORDER — XENON XE 133 GAS
10.0000 | GAS_FOR_INHALATION | Freq: Once | RESPIRATORY_TRACT | Status: AC | PRN
  Administered 2011-04-13: 12.8 via RESPIRATORY_TRACT

## 2011-04-13 MED ORDER — SODIUM CHLORIDE 0.9 % IV BOLUS (SEPSIS)
250.0000 mL | Freq: Once | INTRAVENOUS | Status: AC
  Administered 2011-04-13: 250 mL via INTRAVENOUS

## 2011-04-13 MED ORDER — SODIUM CHLORIDE 0.9 % IV SOLN: INTRAVENOUS | Status: DC

## 2011-04-13 NOTE — ED Provider Notes (Signed)
History  This chart was scribed for Tiffany Jakes, MD by Bennett Scrape. This patient was seen in room APA01/APA01 and the patient's care was started.  CSN: [id number] Arrival date & time: 04/13/2011  3:31 PM   First MD Initiated Contact with Patient 04/13/11 1610      Chief Complaint  Patient presents with  . Chest Pain  . Shortness of Breath  . Dizziness    Patient is a 70 y.o. female presenting with chest pain. The history is provided by the patient. No language interpreter was used.  Chest Pain The chest pain began 3 - 5 days ago. Duration of episode(s) is 5 minutes. Chest pain occurs intermittently. The chest pain is unchanged. The pain is currently at 8/10. The pain does not radiate. Primary symptoms include nausea, vomiting and dizziness. Pertinent negatives for primary symptoms include no abdominal pain.  Dizziness also occurs with nausea and vomiting. Dizziness does not occur with diaphoresis.  Pertinent negatives for associated symptoms include no diaphoresis and no lower extremity edema. She tried nothing for the symptoms.  Her past medical history is significant for CAD, hyperlipidemia and hypertension.    GERALDY AKRIDGE is a 70 y.o. female who presents to the Emergency Department complaining of 3 days of substernal intermittent chest pain episodes that last around 5 minutes. Pt reports that the chest pain has moved to the right side of her chest since arrival to ED. Pt also c/o 2 days of constant non-vertigo dizziness and intermittent SOB. Pt reports that she is feeling SOB since arrival to the ED. Pt reports that she had one unrelated episode of syncope over one week ago, but has not informed her PCP of this incident. Pt states that she took nitroglycerine pills with improvement in the pain.  Pt states that she visits [facility].  Past Medical History  Diagnosis Date  . Lymphoma   . Lupus   . Depression   . Hypertension   . Hyperlipidemia    . Coronary artery disease     Imdur 19 2006 treated with stent to the RCA relook in 2007 stent was patent  . Sjogren's syndrome   . Orthostatic hypotension   . Dysphagia   . Syncope   . Tobacco abuse   . Weight loss     Past Surgical History  Procedure Date  . Left breast biopsy   . Eye surgery   . Left breast biopsy   . Nodule removed due to lymphoma     No family history on file.  History  Substance Use Topics  . Smoking status: Current Everyday Smoker -- 0.5 packs/day for 23 years    Types: Cigarettes  . Smokeless tobacco: Not on file  . Alcohol Use: No    OB History    Grav Para Term Preterm Abortions TAB SAB Ect Mult Living                  Review of Systems  Constitutional: Negative for chills and diaphoresis.  HENT: Negative for neck pain.   Cardiovascular: Positive for chest pain. Negative for leg swelling.  Gastrointestinal: Positive for nausea and vomiting. Negative for abdominal pain, diarrhea and blood in stool.  Genitourinary: Negative for dysuria and hematuria.  Musculoskeletal: Negative for back pain.  Skin: Negative for rash.  Neurological: Positive for dizziness. Negative for headaches.    Allergies  Sulfonamide derivatives  Home Medications   Current Outpatient Rx  Name Route Sig  Dispense Refill  . ALPRAZOLAM 0.5 MG PO TABS Oral Take 0.5 mg by mouth 3 (three) times daily.     Marland Kitchen AMITRIPTYLINE HCL 100 MG PO TABS Oral Take 100 mg by mouth at bedtime.      . ASPIRIN EC 81 MG PO TBEC Oral Take 81 mg by mouth daily.      . ATORVASTATIN CALCIUM 40 MG PO TABS Oral Take 40 mg by mouth daily.      . AZATHIOPRINE 50 MG PO TABS Oral Take 50 mg by mouth 2 (two) times daily.      Marland Kitchen VITAMIN D 400 UNITS PO CAPS Oral Take 400 Units by mouth daily.      Marland Kitchen FLUDROCORTISONE ACETATE 0.1 MG PO TABS Oral Take 1 tablet (0.1 mg total) by mouth 2 (two) times daily. 60 tablet 6  . FOLIC ACID 1 MG PO TABS Oral Take 1 mg by mouth daily.      Marland Kitchen  HYDROCODONE-ACETAMINOPHEN 10-500 MG PO TABS Oral Take 1 tablet by mouth every 6 (six) hours as needed.      Marland Kitchen HYDROXYCHLOROQUINE SULFATE 200 MG PO TABS Oral Take 200 mg by mouth daily.      Marland Kitchen LEVOTHYROXINE SODIUM 25 MCG PO TABS Oral Take 25 mcg by mouth daily.      Marland Kitchen MIDODRINE HCL 10 MG PO TABS Oral Take 10 mg by mouth 3 (three) times daily.      . MULTI-VITAMIN/MINERALS PO TABS Oral Take 1 tablet by mouth daily.      Marland Kitchen PRESERVISION/LUTEIN PO CAPS Oral Take 1 capsule by mouth daily.      Marland Kitchen FISH OIL 1000 MG PO CAPS Oral Take 1 capsule by mouth daily.      Marland Kitchen OMEPRAZOLE 20 MG PO CPDR Oral Take 20 mg by mouth daily.      Marland Kitchen POTASSIUM CHLORIDE CRYS CR 20 MEQ PO TBCR Oral Take 20 mEq by mouth 2 (two) times daily.      . SODIUM BICARBONATE 325 MG PO TABS Oral Take 325 mg by mouth daily.      . TRIAMCINOLONE ACETONIDE 0.5 % EX CREA Topical Apply topically 3 (three) times daily.      Marland Kitchen NITROGLYCERIN 0.4 MG SL SUBL Sublingual Place 0.4 mg under the tongue every 5 (five) minutes as needed.        Triage Vitals: BP 146/76  Pulse 85  Temp(Src) 98.1 F (36.7 C) (Oral)  Resp 20  Ht 5\' 10"  (1.778 m)  Wt 134 lb (60.782 kg)  BMI 19.23 kg/m2  SpO2 98%  Physical Exam  Nursing note and vitals reviewed. Constitutional: She is oriented to person, place, and time. She appears well-developed and well-nourished.  HENT:  Head: Normocephalic and atraumatic.  Mouth/Throat: Oropharynx is clear and moist.  Eyes: Conjunctivae and EOM are normal.  Neck: Neck supple.  Cardiovascular: Normal rate and regular rhythm.   Pulmonary/Chest: Effort normal and breath sounds normal.  Abdominal: Soft. Bowel sounds are normal. There is no tenderness.  Musculoskeletal: Normal range of motion. She exhibits no edema and no tenderness.  Lymphadenopathy:    She has no cervical adenopathy.  Neurological: She is alert and oriented to person, place, and time.       Strength is normal in all extremities  Skin: Skin is warm and dry.     ED Course  Procedures (including critical care time)  DIAGNOSTIC STUDIES: Oxygen Saturation is 98% on room air, normal by my interpretation.    COORDINATION  OF CARE: 4:41PM- Discussed treatment plan with pt at bedside and pt agreed to plan. Advised pt to see PCP for syncope episode . 8:03PM-All labs and x-rays reviewed with patient and the need for further testing was discussed. Pt acknowledged the need and agreed to plan. 11:42PM-All labs and x-rays reviewed with patient and discharge plan discussed. Advised pt to see PCP for the chest pains. Advised pt to return to the ED if the chest pain lasts more than 15 minutes.    Date: 04/13/2011  Rate: 85  Rhythm: normal sinus rhythm and sinus arrhythmia  QRS Axis: normal  Intervals: normal  ST/T Wave abnormalities: normal  Conduction Disutrbances:none  Narrative Interpretation:   Old EKG Reviewed: unchanged Unchanged from her worry 04/02/2011   Results for orders placed during the hospital encounter of 04/13/11  CBC      Component Value Range   WBC 3.8 (*) 4.0 - 10.5 (K/uL)   RBC 3.33 (*) 3.87 - 5.11 (MIL/uL)   Hemoglobin 10.6 (*) 12.0 - 15.0 (g/dL)   HCT 40.9 (*) 81.1 - 46.0 (%)   MCV 98.5  78.0 - 100.0 (fL)   MCH 31.8  26.0 - 34.0 (pg)   MCHC 32.3  30.0 - 36.0 (g/dL)   RDW 91.4  78.2 - 95.6 (%)   Platelets 150  150 - 400 (K/uL)  BASIC METABOLIC PANEL      Component Value Range   Sodium 129 (*) 135 - 145 (mEq/L)   Potassium 4.3  3.5 - 5.1 (mEq/L)   Chloride 99  96 - 112 (mEq/L)   CO2 22  19 - 32 (mEq/L)   Glucose, Bld 90  70 - 99 (mg/dL)   BUN 30 (*) 6 - 23 (mg/dL)   Creatinine, Ser 2.13 (*) 0.50 - 1.10 (mg/dL)   Calcium 9.6  8.4 - 08.6 (mg/dL)   GFR calc non Af Amer 22 (*) >90 (mL/min)   GFR calc Af Amer 25 (*) >90 (mL/min)  PRO B NATRIURETIC PEPTIDE      Component Value Range   BNP, POC 562.5 (*) 0 - 125 (pg/mL)  POCT I-STAT TROPONIN I      Component Value Range   Troponin i, poc 0.03  0.00 - 0.08 (ng/mL)    Comment 3           D-DIMER, QUANTITATIVE      Component Value Range   D-Dimer, Quant 0.77 (*) 0.00 - 0.48 (ug/mL-FEU)   Dg Chest 2 View  04/13/2011  *RADIOLOGY REPORT*  Clinical Data: Chest pain, dizziness  CHEST - 2 VIEW  Comparison: August 10, 2010  Findings: Both lungs are hyperexpanded, consistent with an element of COPD.  The cardiac silhouette, mediastinum, pulmonary vasculature are within normal limits.  Both lungs are clear.  There are ankylosing degenerative changes within the mid thoracic spine.  IMPRESSION: Stable chest x-ray with no evidence of acute cardiac or pulmonary process.  Original Report Authenticated By: Brandon Melnick, M.D.   Ct Head Wo Contrast  04/13/2011  *RADIOLOGY REPORT*  Clinical Data: Dizziness and shortness of breath.  CT HEAD WITHOUT CONTRAST  Technique:  Contiguous axial images were obtained from the base of the skull through the vertex without contrast.  Comparison: Brain MRI 08/30/2009  Findings: Low density in the right corona radiata is similar to the previous MRI and suggestive for an old infarct.  No evidence for acute hemorrhage, mass lesion, midline shift, hydrocephalus or large new infarct.  There is a dense 1.2 cm  structure in the left parietal scalp. This structure is stable and may represent a sebaceous cyst.  No acute bony abnormality. 0  IMPRESSION: No acute intracranial abnormality.  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  Original Report Authenticated By: Richarda Overlie, M.D.   Nm Pulmonary Perfusion  04/13/2011  *RADIOLOGY REPORT*  Clinical Data: Shortness of breath and chest pain  RADIOLOGY EXAMINATION  Radiopharmaceutical: 12.8 mCi xenon-133.  5.2 mCi technetium 52m MAA.  Comparison: Chest radiograph 04/13/2011 and nuclear medicine VQ scan 04/06/2010  Findings: The ventilation portion of the study shows mild air trapping especially at the lung bases.  The perfusion portion of the study shows a very similar distribution of radiotracer compared to  the scan November 2011.  No segmental areas of photopenia are identified.  Scattered nonsegmental tiny perfusion defects are noted.  Findings are most consistent with chronic obstructive lung disease.  IMPRESSION: Low probability for pulmonary embolism.  Air trapping suggests COPD.  Original Report Authenticated By: Britta Mccreedy, M.D.   US Soft Tissue Head/neck  03/25/2011  *RADIOLOGY REPORT*  Clinical Data: Hyperthyroid.  Difficulty swallowing  ULTRASOUND OF HEAD/NECK SOFT TISSUES  Technique:  Ultrasound examination of the head and neck soft tissues was performed in the area of clinical concern.  Comparison:  None  Findings: The right lobe of the thyroid gland measures 4.38 x 1.2 x 0.9 cm.  Normal in echotexture.  No focal lesions.  The left lobe measures 2.0 x 0.4 x 0.8 cm.  The left lobe is atrophic compared with the right.  No focal nodule or mass noted.  IMPRESSION:  1.  There is no evidence for nodule or mass. 2.  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  Original Report Authenticated By: Rosealee Albee, M.D.    Dg Chest 2 View  04/13/2011  *RADIOLOGY REPORT*  Clinical Data: Chest pain, dizziness  CHEST - 2 VIEW  Comparison: August 10, 2010  Findings: Both lungs are hyperexpanded, consistent with an element of COPD.  The cardiac silhouette, mediastinum, pulmonary vasculature are within normal limits.  Both lungs are clear.  There are ankylosing degenerative changes within the mid thoracic spine.  IMPRESSION: Stable chest x-ray with no evidence of acute cardiac or pulmonary process.  Original Report Authenticated By: Brandon Melnick, M.D.   Ct Head Wo Contrast  04/13/2011  *RADIOLOGY REPORT*  Clinical Data: Dizziness and shortness of breath.  CT HEAD WITHOUT CONTRAST  Technique:  Contiguous axial images were obtained from the base of the skull through the vertex without contrast.  Comparison: Brain MRI 08/30/2009  Findings: Low density in the right corona radiata is similar to the previous MRI  and suggestive for an old infarct.  No evidence for acute hemorrhage, mass lesion, midline shift, hydrocephalus or large new infarct.  There is a dense 1.2 cm structure in the left parietal scalp. This structure is stable and may represent a sebaceous cyst.  No acute bony abnormality. 0  IMPRESSION: No acute intracranial abnormality.  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  Original Report Authenticated By: Richarda Overlie, M.D.   Nm Pulmonary Perfusion  04/13/2011  *RADIOLOGY REPORT*  Clinical Data: Shortness of breath and chest pain  RADIOLOGY EXAMINATION  Radiopharmaceutical: 12.8 mCi xenon-133.  5.2 mCi technetium 34m MAA.  Comparison: Chest radiograph 04/13/2011 and nuclear medicine VQ scan 04/06/2010  Findings: The ventilation portion of the study shows mild air trapping especially at the lung bases.  The perfusion portion of the study shows  a very similar distribution of radiotracer compared to the scan November 2011.  No segmental areas of photopenia are identified.  Scattered nonsegmental tiny perfusion defects are noted.  Findings are most consistent with chronic obstructive lung disease.  IMPRESSION: Low probability for pulmonary embolism.  Air trapping suggests COPD.  Original Report Authenticated By: Britta Mccreedy, M.D.     1. Chest pain       MDM   Patient with intermittent chest pain lasting no more than 5 minutes on and off for the past 3 days. No chest pain since arrival to the emergency department. The prior chest pain was substernal. She has had some mild right lateral type chest pain here. Patient with known coronary artery disease a stent placed several years ago followed by cardiology here. Over the years the patient has at times needed to take nitroglycerin for chest pain, her cardiologist are aware of that. Over the past 3 days she's had to use her nitroglycerin twice for chest pain never lasted for more than 5 minutes. Workup today included a VQ scan to rule out PE due to an  elevated d-dimer. The VQ scan results were low probability and not likely consistent with a PE. Her EKG had no acute changes and troponin was normal. Patient stable to go home will need close followup with her cardiologist patient knows to return for chest pain lasting more than 15 minutes or recurring more frequently her requiring more frequent use of nitroglycerin. She will call cardiologist on Monday for followup.      I personally performed the services described in this documentation, which was scribed in my presence. The recorded information has been reviewed and considered.     Tiffany Jakes, MD 04/14/11 [phone]

## 2011-04-13 NOTE — ED Notes (Signed)
Pt returned from VQ Scan

## 2011-04-13 NOTE — ED Notes (Signed)
Pt transported to radiology for VQ Scan

## 2011-04-13 NOTE — ED Notes (Signed)
Pt presents with substernal chest pain, SOB, and dizziness x 3 days.

## 2011-04-13 NOTE — ED Notes (Signed)
Pt c/o mid cp. Pt states pain radiates to left shoulder and pain to shoulder increases with movement of left arm.

## 2011-04-16 ENCOUNTER — Encounter: Payer: Self-pay | Admitting: Adult Health

## 2011-04-23 ENCOUNTER — Ambulatory Visit (INDEPENDENT_AMBULATORY_CARE_PROVIDER_SITE_OTHER): Payer: Medicaid Other | Admitting: Adult Health

## 2011-04-23 ENCOUNTER — Encounter: Payer: Self-pay | Admitting: *Deleted

## 2011-04-23 ENCOUNTER — Encounter: Payer: Self-pay | Admitting: Adult Health

## 2011-04-23 DIAGNOSIS — R079 Chest pain, unspecified: Secondary | ICD-10-CM

## 2011-04-23 DIAGNOSIS — I951 Orthostatic hypotension: Secondary | ICD-10-CM

## 2011-04-23 DIAGNOSIS — E782 Mixed hyperlipidemia: Secondary | ICD-10-CM

## 2011-04-23 DIAGNOSIS — E785 Hyperlipidemia, unspecified: Secondary | ICD-10-CM

## 2011-04-23 DIAGNOSIS — I2510 Atherosclerotic heart disease of native coronary artery without angina pectoris: Secondary | ICD-10-CM

## 2011-04-23 NOTE — Assessment & Plan Note (Signed)
Continue use of florinef and TED hose.  Will follow.

## 2011-04-23 NOTE — Assessment & Plan Note (Signed)
Most recent cardiac cath was completed in 2007 demonstrating mild coronary atherosclerosis in the major epicardial vessels with  widely patent drug-eluting stent within the mid right coronary artery. Left ventricle end-diastolic pressure of 19 mmHg. She has had no other testing since that time. Uncertain if amlodipine contributed to this, as she was normotensive on ER record. Will plan lexiscan myoview for evaluation of progression of known ischemia. She will continue on current medications for now.

## 2011-04-23 NOTE — Assessment & Plan Note (Signed)
Fasting lipids and LFT's will be completed when she comes for her stress test.

## 2011-04-23 NOTE — Progress Notes (Signed)
HPI: Tiffany [name] is a pleasant 70 y/o patient of Dr. Dietrich Pates we are following for ongoing assessment and treatment for CAD with PCI in 2007, orthostatic hypotension, hyperlipidemia. She has a history of COPD.  She was seen at [facility] ER on 04/13/2011 for complaints of chest pressure, dizziness and weakness.  She states her PCP changed her florinef to amlodipine and shortly thereafter she had this episode. She started back on the florinef. Review of ER records demonstrated BP on arrival  146/76. She had CT of chest and head, all found to be normal, no PE or acute cardiopulmonary process. EKG copy showed NSR. Labs reviewed showed Creat of 2.21, Na of 129. Hgb of 12. 1. She was released and asked to follow-up with cardiology. She has unfortunately continued to smoke 6 cigarettes a day.   Allergies  Allergen Reactions  . Sulfonamide Derivatives Hives    Current Outpatient Prescriptions  Medication Sig Dispense Refill  . ALPRAZolam (XANAX) 0.5 MG tablet Take 0.5 mg by mouth 3 (three) times daily.       Marland Kitchen amitriptyline (ELAVIL) 100 MG tablet Take 100 mg by mouth at bedtime.        Marland Kitchen aspirin EC 81 MG tablet Take 81 mg by mouth daily.        Marland Kitchen atorvastatin (LIPITOR) 40 MG tablet Take 40 mg by mouth daily.        . Biotin 5000 MCG CAPS Take 5,000 capsules by mouth daily.        . Carboxymethylcellul-Glycerin (OPTIVE SENSITIVE OP) Apply to eye as needed.        . Cholecalciferol (VITAMIN D) 400 UNITS capsule Take 400 Units by mouth daily.        . clobetasol cream (TEMOVATE) 0.05 % Apply 0.05 application topically 2 (two) times daily.        . fludrocortisone (FLORINEF) 0.1 MG tablet Take 1 tablet (0.1 mg total) by mouth 2 (two) times daily.  60 tablet  6  . folic acid (FOLVITE) 1 MG tablet Take 1 mg by mouth daily.        Marland Kitchen HYDROcodone-acetaminophen (LORTAB) 10-500 MG per tablet Take 1 tablet by mouth every 6 (six) hours as needed.        . hydroxychloroquine (PLAQUENIL) 200 MG tablet Take 200 mg by  mouth daily.        Marland Kitchen levothyroxine (SYNTHROID, LEVOTHROID) 25 MCG tablet Take 25 mcg by mouth daily.        . Multiple Vitamins-Minerals (PRESERVISION/LUTEIN) CAPS Take 1 capsule by mouth daily.        . nitroGLYCERIN (NITROSTAT) 0.4 MG SL tablet Place 0.4 mg under the tongue every 5 (five) minutes as needed.        . Omega-3 Fatty Acids (FISH OIL) 1000 MG CAPS Take 1 capsule by mouth daily.        Marland Kitchen omeprazole (PRILOSEC) 20 MG capsule Take 20 mg by mouth daily.        . potassium chloride SA (K-DUR,KLOR-CON) 20 MEQ tablet Take 10 mEq by mouth 2 (two) times daily.       . raloxifene (EVISTA) 60 MG tablet Take 60 mg by mouth daily.        . sodium bicarbonate 325 MG tablet Take 325 mg by mouth daily.          Past Medical History  Diagnosis Date  . Lymphoma   . Lupus   . Depression   . Hypertension   .  Hyperlipidemia   . Coronary artery disease     Imdur 19 2006 treated with stent to the RCA relook in 2007 stent was patent  . Sjogren's syndrome   . Orthostatic hypotension   . Dysphagia   . Syncope   . Tobacco abuse   . Weight loss     Past Surgical History  Procedure Date  . Left breast biopsy   . Eye surgery   . Left breast biopsy   . Nodule removed due to lymphoma     ZOX:WRUEAV of systems complete and found to be negative unless listed above PHYSICAL EXAM BP 141/86  Pulse 83  Ht 5\' 10"  (1.778 m)  Wt 136 lb (61.689 kg)  BMI 19.51 kg/m2  General: Well developed, well nourished, in no acute distress Head: Eyes PERRLA, No xanthomas.   Normal cephalic and atramatic  Lungs: Clear bilaterally to auscultation and percussion. Heart: HRRR S1 S2, soft 1/6 systolic murmur.  Pulses are 2+ & equal.            No carotid bruit. No JVD.  No abdominal bruits. No femoral bruits. Abdomen: Bowel sounds are positive, abdomen soft and non-tender without masses or                  Hernia's noted. Msk:  Back normal, normal gait. Normal strength and tone for age. Extremities: No  clubbing, cyanosis or edema.  DP +1 Neuro: Alert and oriented X 3. Psych:  Good affect, responds appropriately   ASSESSMENT AND PLAN

## 2011-04-23 NOTE — Patient Instructions (Signed)
Your physician recommends that you schedule a follow-up appointment in: After Stress Test with Dr Dietrich Pates  Your physician recommends that you return for lab work in: Day of Stress Test  Your physician has requested that you have a lexiscan myoview. For further information please visit [email or web address] Please follow instruction sheet, as given.

## 2011-04-30 ENCOUNTER — Encounter: Payer: Self-pay | Admitting: Internal Medicine

## 2011-05-15 ENCOUNTER — Encounter (HOSPITAL_COMMUNITY): Payer: Self-pay | Admitting: Cardiology

## 2011-05-15 ENCOUNTER — Encounter (HOSPITAL_COMMUNITY)
Admission: RE | Admit: 2011-05-15 | Discharge: 2011-05-15 | Disposition: A | Discharge status: Home / Self Care | Payer: PRIVATE HEALTH INSURANCE | Source: Ambulatory Visit | Attending: Adult Health | Admitting: Adult Health

## 2011-05-15 ENCOUNTER — Other Ambulatory Visit: Payer: Self-pay | Admitting: Adult Health

## 2011-05-15 ENCOUNTER — Ambulatory Visit (INDEPENDENT_AMBULATORY_CARE_PROVIDER_SITE_OTHER): Payer: PRIVATE HEALTH INSURANCE | Admitting: *Deleted

## 2011-05-15 DIAGNOSIS — I2510 Atherosclerotic heart disease of native coronary artery without angina pectoris: Secondary | ICD-10-CM

## 2011-05-15 DIAGNOSIS — E782 Mixed hyperlipidemia: Secondary | ICD-10-CM

## 2011-05-15 DIAGNOSIS — R079 Chest pain, unspecified: Secondary | ICD-10-CM

## 2011-05-15 MED ORDER — TECHNETIUM TC 99M TETROFOSMIN IV KIT
10.0000 | PACK | Freq: Once | INTRAVENOUS | Status: AC | PRN
  Administered 2011-05-15: 9.8 via INTRAVENOUS

## 2011-05-15 MED ORDER — TECHNETIUM TC 99M TETROFOSMIN IV KIT
30.0000 | PACK | Freq: Once | INTRAVENOUS | Status: AC | PRN
  Administered 2011-05-15: 28.5 via INTRAVENOUS

## 2011-05-15 NOTE — Progress Notes (Signed)
Stress Lab Nurses Notes - Tiffany [name]  Tiffany [name] 05/15/2011  Reason for doing test: CAD and Chest Pain Type of test: [name] Nurse performing test: Parke Poisson, RN Nuclear Medicine Tech: Lou Cal Echo Tech: Not Applicable MD performing test: R. Dietrich Pates Family MD: Colon Branch Test explained and consent signed: yes IV started: 22g jelco, Saline lock flushed, No redness or edema and Saline lock started in radiology Symptoms: lightheaded Treatment/Intervention: None Reason test stopped: protocol completed After recovery IV was: Discontinued via X-ray tech and No redness or edema Patient to return to Nuc. Med at : 12:00 pm Patient discharged: Home Patient's Condition upon discharge was: stable Comments: During test BP 138/70 & HR 84.  Symptoms resolved in recovery.  [name] T

## 2011-05-16 LAB — HEPATIC FUNCTION PANEL
Bilirubin, Direct: <0.1 mg/dL (ref 0.0–0.3)
Total Bilirubin: 0.2 mg/dL — ABNORMAL LOW (ref 0.3–1.2)

## 2011-05-16 LAB — LIPID PANEL
Cholesterol: 152 mg/dL (ref 0–200)
LDL Cholesterol: 82 mg/dL (ref 0–99)
VLDL: 12 mg/dL (ref 0–40)

## 2011-05-20 ENCOUNTER — Other Ambulatory Visit: Payer: Self-pay | Admitting: *Deleted

## 2011-05-20 MED ORDER — NITROGLYCERIN 0.4 MG SL SUBL: 0.4000 mg | SUBLINGUAL_TABLET | SUBLINGUAL | Status: DC | PRN

## 2011-05-24 ENCOUNTER — Encounter: Payer: Self-pay | Admitting: Cardiology

## 2011-05-24 ENCOUNTER — Telehealth: Payer: Self-pay | Admitting: *Deleted

## 2011-05-24 ENCOUNTER — Ambulatory Visit (INDEPENDENT_AMBULATORY_CARE_PROVIDER_SITE_OTHER): Payer: PRIVATE HEALTH INSURANCE | Admitting: Cardiology

## 2011-05-24 VITALS — BP 143/75 | HR 80 | Ht 70.0 in | Wt 138.0 lb

## 2011-05-24 DIAGNOSIS — N184 Chronic kidney disease, stage 4 (severe): Secondary | ICD-10-CM | POA: Insufficient documentation

## 2011-05-24 DIAGNOSIS — I2510 Atherosclerotic heart disease of native coronary artery without angina pectoris: Secondary | ICD-10-CM

## 2011-05-24 DIAGNOSIS — I951 Orthostatic hypotension: Secondary | ICD-10-CM

## 2011-05-24 DIAGNOSIS — N189 Chronic kidney disease, unspecified: Secondary | ICD-10-CM

## 2011-05-24 DIAGNOSIS — E785 Hyperlipidemia, unspecified: Secondary | ICD-10-CM

## 2011-05-24 DIAGNOSIS — I10 Essential (primary) hypertension: Secondary | ICD-10-CM | POA: Insufficient documentation

## 2011-05-24 DIAGNOSIS — D649 Anemia, unspecified: Secondary | ICD-10-CM | POA: Insufficient documentation

## 2011-05-24 DIAGNOSIS — R634 Abnormal weight loss: Secondary | ICD-10-CM

## 2011-05-24 DIAGNOSIS — R55 Syncope and collapse: Secondary | ICD-10-CM

## 2011-05-24 NOTE — Telephone Encounter (Signed)
Will discuss lab results and stress results at visit today.

## 2011-05-24 NOTE — Assessment & Plan Note (Signed)
Lipid control was excellent when last assessed one month ago.  Current therapy will be continued.

## 2011-05-24 NOTE — Assessment & Plan Note (Signed)
BP 143/75  Pulse 80  Ht 5\' 10"  (1.778 m)  Wt 62.596 kg (138 lb)  BMI 19.80 kg/m2  Control of hypertension is adequate in view of her significant orthostatic drop in blood pressure and continuing albeit occasional symptoms.  It will be necessary to tolerate a modest elevation in blood pressure to protect her from symptomatic hypotension.  She is advised to sleep with the head of bed elevated.

## 2011-05-24 NOTE — Progress Notes (Signed)
Patient ID: Donalee Citrin, female   DOB: 1940/06/24, 71 y.o.   MRN: [id number] HPI: Return visit for continued assessment and treatment of orthostatic hypotension and as the result of a recent emergency department visit with chest pain and dyspnea.  Pulmonary embolism was excluded prior to discharge from the ER.  A subsequent stress nuclear study was interpreted as consistent with breast attenuation artifact.  She has had no further chest discomfort nor dyspnea.  She last lost consciousness approximately 4 months ago when she fell to the floor.  She didn't have premonitory symptoms but did not attend to them and did not hurt herself.  Dizziness has been much improved.  Prior to Admission medications   Medication Sig Start Date End Date Taking? Authorizing Provider  ALPRAZolam Prudy Feeler) 0.5 MG tablet Take 0.5 mg by mouth 3 (three) times daily.    Yes Historical Provider, MD  amitriptyline (ELAVIL) 100 MG tablet Take 100 mg by mouth at bedtime.     Yes Historical Provider, MD  aspirin EC 81 MG tablet Take 81 mg by mouth daily.     Yes Historical Provider, MD  atorvastatin (LIPITOR) 40 MG tablet Take 40 mg by mouth daily.     Yes Historical Provider, MD  Biotin 5000 MCG CAPS Take 5,000 mcg by mouth daily.    Yes Historical Provider, MD  Carboxymethylcellul-Glycerin (OPTIVE SENSITIVE OP) Apply to eye as needed.     Yes Historical Provider, MD  Cholecalciferol (VITAMIN D) 400 UNITS capsule Take 400 Units by mouth daily.     Yes Historical Provider, MD  clobetasol cream (TEMOVATE) 0.05 % Apply 0.05 application topically 2 (two) times daily.     Yes Historical Provider, MD  fludrocortisone (FLORINEF) 0.1 MG tablet Take 1 tablet (0.1 mg total) by mouth 2 (two) times daily. 11/05/10 11/05/11 Yes Joni Reining, NP  folic acid (FOLVITE) 1 MG tablet Take 1 mg by mouth daily.     Yes Historical Provider, MD  HYDROcodone-acetaminophen (VICODIN) 5-500 MG per tablet Take 1 tablet by mouth every 6 (six) hours as needed.    Yes Historical Provider, MD  HYDROcodone-acetaminophen (VICODIN) 5-500 MG per tablet Take 1 tablet by mouth every 6 (six) hours as needed.   Yes Historical Provider, MD  hydroxychloroquine (PLAQUENIL) 200 MG tablet Take 200 mg by mouth daily.     Yes Historical Provider, MD  levothyroxine (SYNTHROID, LEVOTHROID) 25 MCG tablet Take 25 mcg by mouth daily.     Yes Historical Provider, MD  Multiple Vitamins-Minerals (PRESERVISION/LUTEIN) CAPS Take 1 capsule by mouth daily.     Yes Historical Provider, MD  nitroGLYCERIN (NITROSTAT) 0.4 MG SL tablet Place 1 tablet (0.4 mg total) under the tongue every 5 (five) minutes as needed. 05/20/11  Yes Joni Reining, NP  Omega-3 Fatty Acids (FISH OIL) 1000 MG CAPS Take 1 capsule by mouth daily.     Yes Historical Provider, MD  omeprazole (PRILOSEC) 20 MG capsule Take 20 mg by mouth daily.     Yes Historical Provider, MD  potassium chloride SA (K-DUR,KLOR-CON) 20 MEQ tablet Take 10 mEq by mouth 2 (two) times daily.    Yes Historical Provider, MD  raloxifene (EVISTA) 60 MG tablet Take 60 mg by mouth daily.     Yes Historical Provider, MD  sodium bicarbonate 325 MG tablet Take 325 mg by mouth daily.     Yes Historical Provider, MD   Allergies  Allergen Reactions  . Florinef (Fludrocortisone Acetate)     Malaise   .  Sulfonamide Derivatives Hives  Past medical history, social history, and family history reviewed and updated.  ROS: See history of present illness.  PHYSICAL EXAM: BP 143/75  Pulse 80  Ht 5\' 10"  (1.778 m)  Wt 62.596 kg (138 lb)  BMI 19.80 kg/m2 ; 35 mm decrease in systolic blood pressure on assuming the standing position General-Well developed; no acute distress Body habitus-thin Neck-No JVD; no carotid bruits Lungs-clear lung fields; resonant to percussion Cardiovascular-normal PMI; normal S1 and S2 Abdomen-normal bowel sounds; soft and non-tender without masses or organomegaly Musculoskeletal-No deformities, no cyanosis or  clubbing Neurologic-Normal cranial nerves; symmetric strength and tone Skin-Warm, no significant lesions Extremities-distal pulses intact; no edema; compression stockings in place  ASSESSMENT AND PLAN:  [place] Bing, MD 05/24/2011 12:49 PM

## 2011-05-24 NOTE — Assessment & Plan Note (Signed)
Hyponatremia may be secondary to use of Florinef, but deterioration in renal function not likely related to the drug.  We will continue to monitor electrolytes and renal function.

## 2011-05-24 NOTE — Assessment & Plan Note (Signed)
Episodes continue, but at a much reduced frequency.  Patient is aware of techniques to avoid loss of consciousness, but does not practice.  The importance of avoiding trauma was again discussed with her.

## 2011-05-24 NOTE — Assessment & Plan Note (Signed)
Weight has been stable since her last visit.

## 2011-05-24 NOTE — Patient Instructions (Signed)
Your physician recommends that you schedule a follow-up appointment in: 6 months  Sleep with head elevated   Your physician recommends that you return for lab work in: Today  Stool cards x 3 and return to the office as soon as possible

## 2011-05-24 NOTE — Assessment & Plan Note (Addendum)
Control of orthostatic hypotension and symptoms is probably about the best we can do without causing unacceptable supine hypertension.  Patient was advised to discontinue ProAmatine, but does not know by whom or why.  She does not appear to need 2 medications at present.  Current treatment with Florinef will be continued.

## 2011-05-24 NOTE — Assessment & Plan Note (Signed)
Patient has had long-standing anemia, present at this level or worse for at least the past few years.  Iron studies obtained in 2011 were nondiagnostic with serum iron of 67, TIBC of 179, saturation 37% and a ferritin of 56, subsequently measured at 93.  She reports colonoscopy approximately 3-4 years ago with negative findings.  Anemia appears out of portion to her connective tissue problems.  Stool for Hemoccult testing will be obtained.  TSH was normal in 2012 as was cortisol in 2011.  She has never been seen by a hematologist and will address a referral at her next visit with her PCP.

## 2011-05-24 NOTE — Assessment & Plan Note (Addendum)
There has been no convincing evidence for myocardial ischemia.  We will continue to treat symptomatically and to optimize risk factor control.

## 2011-05-25 LAB — CBC
MCH: 30.2 pg (ref 26.0–34.0)
MCHC: 30.6 g/dL (ref 30.0–36.0)
MCV: 98.8 fL (ref 78.0–100.0)
Platelets: 186 10*3/uL (ref 150–400)
RBC: 3.44 MIL/uL — ABNORMAL LOW (ref 3.87–5.11)
RDW: 14.3 % (ref 11.5–15.5)

## 2011-05-25 LAB — COMPREHENSIVE METABOLIC PANEL
ALT: 8 U/L (ref 0–35)
AST: 13 U/L (ref 0–37)
Alkaline Phosphatase: 66 U/L (ref 39–117)
Sodium: 139 mEq/L (ref 135–145)
Total Bilirubin: 0.2 mg/dL — ABNORMAL LOW (ref 0.3–1.2)
Total Protein: 8.1 g/dL (ref 6.0–8.3)

## 2011-05-25 LAB — IRON AND TIBC: %SAT: 14 % — ABNORMAL LOW (ref 20–55)

## 2011-05-28 ENCOUNTER — Encounter: Payer: Self-pay | Admitting: Cardiology

## 2011-07-04 ENCOUNTER — Telehealth: Payer: Self-pay | Admitting: Adult Health

## 2011-07-04 MED ORDER — FLUDROCORTISONE ACETATE 0.1 MG PO TABS: 0.1000 mg | ORAL_TABLET | Freq: Two times a day (BID) | ORAL | Status: DC

## 2011-07-04 NOTE — Telephone Encounter (Signed)
Patient needs refill on Fludrocortisone 1mg  called to [facility] in [place]. / tg

## 2011-09-06 ENCOUNTER — Emergency Department (HOSPITAL_COMMUNITY): Payer: PRIVATE HEALTH INSURANCE

## 2011-09-06 ENCOUNTER — Encounter (HOSPITAL_COMMUNITY): Payer: Self-pay

## 2011-09-06 ENCOUNTER — Inpatient Hospital Stay (HOSPITAL_COMMUNITY)
Admission: EM | Admit: 2011-09-06 | Discharge: 2011-09-09 | DRG: 175 | Disposition: A | Discharge status: Home / Self Care | Payer: PRIVATE HEALTH INSURANCE | Attending: Internal Medicine | Admitting: Internal Medicine

## 2011-09-06 DIAGNOSIS — E785 Hyperlipidemia, unspecified: Secondary | ICD-10-CM | POA: Diagnosis present

## 2011-09-06 DIAGNOSIS — D631 Anemia in chronic kidney disease: Secondary | ICD-10-CM | POA: Diagnosis present

## 2011-09-06 DIAGNOSIS — I2510 Atherosclerotic heart disease of native coronary artery without angina pectoris: Secondary | ICD-10-CM | POA: Diagnosis present

## 2011-09-06 DIAGNOSIS — I10 Essential (primary) hypertension: Secondary | ICD-10-CM | POA: Diagnosis present

## 2011-09-06 DIAGNOSIS — I129 Hypertensive chronic kidney disease with stage 1 through stage 4 chronic kidney disease, or unspecified chronic kidney disease: Secondary | ICD-10-CM | POA: Diagnosis present

## 2011-09-06 DIAGNOSIS — R079 Chest pain, unspecified: Secondary | ICD-10-CM | POA: Diagnosis present

## 2011-09-06 DIAGNOSIS — M329 Systemic lupus erythematosus, unspecified: Secondary | ICD-10-CM | POA: Diagnosis present

## 2011-09-06 DIAGNOSIS — I252 Old myocardial infarction: Secondary | ICD-10-CM

## 2011-09-06 DIAGNOSIS — J4489 Other specified chronic obstructive pulmonary disease: Secondary | ICD-10-CM | POA: Diagnosis present

## 2011-09-06 DIAGNOSIS — J189 Pneumonia, unspecified organism: Secondary | ICD-10-CM | POA: Diagnosis present

## 2011-09-06 DIAGNOSIS — N184 Chronic kidney disease, stage 4 (severe): Secondary | ICD-10-CM | POA: Diagnosis present

## 2011-09-06 DIAGNOSIS — F17200 Nicotine dependence, unspecified, uncomplicated: Secondary | ICD-10-CM | POA: Diagnosis present

## 2011-09-06 DIAGNOSIS — I2699 Other pulmonary embolism without acute cor pulmonale: Principal | ICD-10-CM | POA: Diagnosis present

## 2011-09-06 DIAGNOSIS — D649 Anemia, unspecified: Secondary | ICD-10-CM | POA: Diagnosis present

## 2011-09-06 DIAGNOSIS — J449 Chronic obstructive pulmonary disease, unspecified: Secondary | ICD-10-CM | POA: Diagnosis present

## 2011-09-06 DIAGNOSIS — N189 Chronic kidney disease, unspecified: Secondary | ICD-10-CM | POA: Diagnosis present

## 2011-09-06 DIAGNOSIS — M3500 Sicca syndrome, unspecified: Secondary | ICD-10-CM | POA: Diagnosis present

## 2011-09-06 HISTORY — DX: Anemia, unspecified: D64.9

## 2011-09-06 LAB — BASIC METABOLIC PANEL
BUN: 18 mg/dL (ref 6–23)
Calcium: 9 mg/dL (ref 8.4–10.5)
Creatinine, Ser: 1.5 mg/dL — ABNORMAL HIGH (ref 0.50–1.10)
GFR calc Af Amer: 40 mL/min — ABNORMAL LOW (ref >90)
GFR calc non Af Amer: 34 mL/min — ABNORMAL LOW (ref >90)

## 2011-09-06 LAB — DIFFERENTIAL
Basophils Absolute: 0 10*3/uL (ref 0.0–0.1)
Eosinophils Absolute: 0 10*3/uL (ref 0.0–0.7)
Eosinophils Relative: 1 % (ref 0–5)
Lymphs Abs: 0.7 10*3/uL (ref 0.7–4.0)
Monocytes Absolute: 0.4 10*3/uL (ref 0.1–1.0)
Neutrophils Relative %: 71 % (ref 43–77)

## 2011-09-06 LAB — CARDIAC PANEL(CRET KIN+CKTOT+MB+TROPI)
CK, MB: 2 ng/mL (ref 0.3–4.0)
Total CK: 55 U/L (ref 7–177)

## 2011-09-06 LAB — POCT I-STAT TROPONIN I: Troponin i, poc: 0.02 ng/mL (ref 0.00–0.08)

## 2011-09-06 LAB — CBC
MCH: 32.5 pg (ref 26.0–34.0)
MCHC: 33.4 g/dL (ref 30.0–36.0)
MCV: 97.1 fL (ref 78.0–100.0)
Platelets: 159 10*3/uL (ref 150–400)
RBC: 3.11 MIL/uL — ABNORMAL LOW (ref 3.87–5.11)
RDW: 17.8 % — ABNORMAL HIGH (ref 11.5–15.5)

## 2011-09-06 MED ORDER — SODIUM CHLORIDE 0.9 % IV BOLUS (SEPSIS)
500.0000 mL | Freq: Once | INTRAVENOUS | Status: AC
  Administered 2011-09-06: 500 mL via INTRAVENOUS

## 2011-09-06 MED ORDER — FLUDROCORTISONE ACETATE 0.1 MG PO TABS
ORAL_TABLET | ORAL | Status: AC
  Filled 2011-09-06: qty 1

## 2011-09-06 MED ORDER — ENOXAPARIN SODIUM 100 MG/ML ~~LOC~~ SOLN
1.0000 mg/kg | Freq: Once | SUBCUTANEOUS | Status: AC
  Administered 2011-09-06: 60 mg via SUBCUTANEOUS

## 2011-09-06 MED ORDER — BIOTENE DRY MOUTH MT LIQD
15.0000 mL | Freq: Two times a day (BID) | OROMUCOSAL | Status: DC
  Administered 2011-09-06 – 2011-09-09 (×3): 15 mL via OROMUCOSAL

## 2011-09-06 MED ORDER — MORPHINE SULFATE 2 MG/ML IJ SOLN: 1.0000 mg | INTRAMUSCULAR | Status: DC | PRN

## 2011-09-06 MED ORDER — POTASSIUM CHLORIDE IN NACL 20-0.9 MEQ/L-% IV SOLN
INTRAVENOUS | Status: AC
  Administered 2011-09-06: 23:00:00 via INTRAVENOUS

## 2011-09-06 MED ORDER — RALOXIFENE HCL 60 MG PO TABS
60.0000 mg | ORAL_TABLET | Freq: Every day | ORAL | Status: DC
  Administered 2011-09-07 – 2011-09-09 (×3): 60 mg via ORAL
  Filled 2011-09-06 (×3): qty 1

## 2011-09-06 MED ORDER — FLUDROCORTISONE ACETATE 0.1 MG PO TABS
0.1000 mg | ORAL_TABLET | Freq: Two times a day (BID) | ORAL | Status: DC
  Administered 2011-09-06 – 2011-09-09 (×6): 0.1 mg via ORAL
  Filled 2011-09-06 (×11): qty 1

## 2011-09-06 MED ORDER — ALPRAZOLAM 0.5 MG PO TABS
0.5000 mg | ORAL_TABLET | Freq: Three times a day (TID) | ORAL | Status: DC
  Administered 2011-09-06 – 2011-09-09 (×8): 0.5 mg via ORAL
  Filled 2011-09-06 (×8): qty 1

## 2011-09-06 MED ORDER — ASPIRIN EC 81 MG PO TBEC
81.0000 mg | DELAYED_RELEASE_TABLET | Freq: Every day | ORAL | Status: DC
  Administered 2011-09-06 – 2011-09-08 (×3): 81 mg via ORAL
  Filled 2011-09-06 (×3): qty 1

## 2011-09-06 MED ORDER — ENOXAPARIN SODIUM 60 MG/0.6ML ~~LOC~~ SOLN: 60.0000 mg | Freq: Two times a day (BID) | SUBCUTANEOUS | Status: DC

## 2011-09-06 MED ORDER — AMITRIPTYLINE HCL 25 MG PO TABS
100.0000 mg | ORAL_TABLET | Freq: Every day | ORAL | Status: DC
  Administered 2011-09-06 – 2011-09-08 (×3): 100 mg via ORAL
  Filled 2011-09-06 (×3): qty 4

## 2011-09-06 MED ORDER — ENOXAPARIN SODIUM 60 MG/0.6ML ~~LOC~~ SOLN
SUBCUTANEOUS | Status: AC
  Filled 2011-09-06: qty 0.6

## 2011-09-06 MED ORDER — ATORVASTATIN CALCIUM 40 MG PO TABS
40.0000 mg | ORAL_TABLET | Freq: Every day | ORAL | Status: DC
  Administered 2011-09-06 – 2011-09-08 (×3): 40 mg via ORAL
  Filled 2011-09-06 (×3): qty 1

## 2011-09-06 MED ORDER — NITROGLYCERIN 0.4 MG SL SUBL
0.4000 mg | SUBLINGUAL_TABLET | SUBLINGUAL | Status: DC | PRN
  Administered 2011-09-06 (×2): 0.4 mg via SUBLINGUAL
  Filled 2011-09-06: qty 25

## 2011-09-06 MED ORDER — LEVOTHYROXINE SODIUM 25 MCG PO TABS
25.0000 ug | ORAL_TABLET | Freq: Every day | ORAL | Status: DC
  Administered 2011-09-07 – 2011-09-09 (×3): 25 ug via ORAL
  Filled 2011-09-06 (×3): qty 1

## 2011-09-06 MED ORDER — SODIUM CHLORIDE 0.9 % IJ SOLN
3.0000 mL | Freq: Two times a day (BID) | INTRAMUSCULAR | Status: DC
  Administered 2011-09-07 – 2011-09-08 (×2): 3 mL via INTRAVENOUS
  Filled 2011-09-06 (×3): qty 3

## 2011-09-06 NOTE — H&P (Signed)
PCP:   Colon Branch, MD, MD   Chief Complaint:  Chest pain  HPI: 71 year old female who has a history of coronary artery disease status post stenting in 2007 comes in with chest pain has been waxing and waning and sharp in nature since yesterday. She taken several nitroglycerin and the pain persisted. She does not normally have chest pain. She's also been short of breath denies any lower extremity edema denies any fevers or cough. She's not had any recent traveling or recent trauma. She had a CAT scan of the chest without contrast which showed questionable pulmonary infarct versus pulmonary emboli.  Review of Systems:  Otherwise negative  Past Medical History: Past Medical History  Diagnosis Date  . Lymphoma   . Systemic lupus erythematosus   . Depression   . Hypertension   . Hyperlipidemia   . 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 Sjogren's syndrome   . Orthostatic hypotension   . Dysphagia   . Syncope   . Tobacco abuse   . Weight loss   . Anemia    Past Surgical History  Procedure Date  . Breast excisional biopsy     Left  . Eye surgery   . Mass excision     Lymphoma  . Coronary angioplasty with stent placement   . Cholecystectomy     Medications: Prior to Admission medications   Medication Sig Start Date End Date Taking? Authorizing Provider  ALPRAZolam Prudy Feeler) 0.5 MG tablet Take 0.5 mg by mouth 3 (three) times daily.    Yes Historical Provider, MD  amitriptyline (ELAVIL) 100 MG tablet Take 100 mg by mouth at bedtime.     Yes Historical Provider, MD  aspirin EC 81 MG tablet Take 81 mg by mouth at bedtime.    Yes Historical Provider, MD  atorvastatin (LIPITOR) 40 MG tablet Take 40 mg by mouth at bedtime.    Yes Historical Provider, MD  Biotin 5000 MCG CAPS Take 5,000 mcg by mouth daily.    Yes Historical Provider, MD  Carboxymethylcellul-Glycerin (OPTIVE SENSITIVE OP) Apply to  eye as needed.     Yes Historical Provider, MD  Cholecalciferol (VITAMIN D) 400 UNITS capsule Take 400 Units by mouth daily.     Yes Historical Provider, MD  clobetasol cream (TEMOVATE) 0.05 % Apply 0.05 application topically 2 (two) times daily.     Yes Historical Provider, MD  fludrocortisone (FLORINEF) 0.1 MG tablet Take 1 tablet (0.1 mg total) by mouth 2 (two) times daily. 07/04/11 07/03/12 Yes Jodelle Gross, NP  folic acid (FOLVITE) 1 MG tablet Take 1 mg by mouth daily.     Yes Historical Provider, MD  HYDROcodone-acetaminophen (VICODIN) 5-500 MG per tablet Take 1 tablet by mouth every 6 (six) hours as needed.   Yes Historical Provider, MD  hydroxychloroquine (PLAQUENIL) 200 MG tablet Take 200 mg by mouth daily.     Yes Historical Provider, MD  levothyroxine (SYNTHROID, LEVOTHROID) 25 MCG tablet Take 25 mcg by mouth every morning.    Yes Historical Provider, MD  Multiple Vitamins-Minerals (PRESERVISION/LUTEIN) CAPS Take 1 capsule by mouth 2 (two) times daily.    Yes Historical Provider, MD  nitroGLYCERIN (NITROSTAT) 0.4 MG SL tablet Place 1 tablet (0.4 mg total) under the tongue every 5 (five) minutes as needed. 05/20/11  Yes Jodelle Gross, NP  Omega-3 Fatty Acids (FISH OIL) 1000 MG CAPS Take 1 capsule by  mouth at bedtime.    Yes Historical Provider, MD  omeprazole (PRILOSEC) 20 MG capsule Take 20 mg by mouth 2 (two) times daily.    Yes Historical Provider, MD  potassium chloride SA (K-DUR,KLOR-CON) 20 MEQ tablet Take 10 mEq by mouth 2 (two) times daily.    Yes Historical Provider, MD  raloxifene (EVISTA) 60 MG tablet Take 60 mg by mouth daily.     Yes Historical Provider, MD  sodium bicarbonate 325 MG tablet Take 325 mg by mouth daily.     Yes Historical Provider, MD    Allergies:   Allergies  Allergen Reactions  . Florinef (Fludrocortisone Acetate)     Malaise: Discomfort   . Iohexol Other (See Comments)    Pt. States that Dr. Lowell Guitar her kidney Dr. Does not want her to receive  iv dye due to increased risk of kidney failure.   . Sulfonamide Derivatives Hives  . Tape Rash    ADHESIVE    Social History:  reports that she has been smoking Cigarettes.  She has a 11.5 pack-year smoking history. She does not have any smokeless tobacco history on file. She reports that she does not drink alcohol or use illicit drugs.   Family History: Family History  Problem Relation Age of Onset  . Cancer Other   . Coronary artery disease      Physical Exam: Filed Vitals:   09/06/11 1548 09/06/11 1702 09/06/11 1816 09/06/11 1819  BP: 150/75 138/66 151/69   Pulse: 72 66  68  Temp:      TempSrc:      Resp: 18 18  19   SpO2: 99% 99%  100%   BP 168/89  Pulse 72  Temp(Src) 98.2 F (36.8 C) (Oral)  Resp 20  Wt 64.2 kg (141 lb 8.6 oz)  SpO2 98% General appearance: alert, cooperative and no distress Neck: no adenopathy, no carotid bruit, no JVD and supple, symmetrical, trachea midline Lungs: clear to auscultation bilaterally Heart: regular rate and rhythm, S1, S2 normal, no murmur, click, rub or gallop Abdomen: soft, non-tender; bowel sounds normal; no masses,  no organomegaly Extremities: extremities normal, atraumatic, no cyanosis or edema Pulses: 2+ and symmetric Skin: Skin color, texture, turgor normal. No rashes or lesions Neurologic: Grossly normal    Labs on Admission:   [facility] 09/06/11 1530  NA 140  K 3.2*  CL 109  CO2 22  GLUCOSE 82  BUN 18  CREATININE 1.50*  CALCIUM 9.0  MG --  PHOS --    Basename 09/06/11 1530  WBC 3.9*  NEUTROABS 2.8  HGB 10.1*  HCT 30.2*  MCV 97.1  PLT 159    Radiological Exams on Admission: Ct Chest Wo Contrast  09/06/2011  *RADIOLOGY REPORT*  Clinical Data: Shortness of breath.  Coronary artery stent. Chronic kidney disease.  CT CHEST WITHOUT CONTRAST  Technique:  Multidetector CT imaging of the chest was performed following the standard protocol without IV contrast.  Comparison: Chest radiographs, the most recent  obtained earlier today.  Report of the previous limited chest CT dated 02/03/2001.  Findings: Linear and wedge shaped opacities in the left lower lobe. 4 mm noncalcified nodule in the right upper lobe on image number 15.  Minimal linear density at the right lung base in the right lower lobe and minimal linear density anteriorly in the left upper lobe.  Biapical pleural and parenchymal scarring.  No enlarged lymph nodes.  Small hiatal hernia.  Prominent AP diameter of the chest with some flattening  of the hemidiaphragms.  Thoracic spine degenerative changes.  IMPRESSION:  1.  Linear and wedge shaped opacities in the left lower lobe. These are concerning for the possibility of areas of pulmonary infarction.  These do not have the typical appearances of pneumonia or atelectasis.  If pulmonary embolism is a clinical concern and the patient is not a candidate for the chest CTA, this can be further evaluated with nuclear medicine ventilation and perfusion lung scan. 2.  4 mm right upper lobe nodule.  If the patient is at high risk for bronchogenic carcinoma, follow-up chest CT at 6-12 months is recommended.  If the patient is at low risk for bronchogenic carcinoma, follow-up chest CT at 12 months is recommended.  This recommendation follows the consensus statement: Guidelines for Management of Small Pulmonary Nodules Detected on CT Scans: A Statement from the Fleischner Society as published in Radiology 2005; 237:395-400.   3.  Small linear areas of atelectasis or scarring in the right lower lobe and left upper lobe. 4.  Mild changes of COPD. 5.  Bilateral renal calculi. 6.  Mild left hydronephrosis or possibly due to a left ureteral calculus, not included. 7.  Small hiatal hernia.  Original Report Authenticated By: Darrol Angel, M.D.   Dg Chest Port 1 View  09/06/2011  *RADIOLOGY REPORT*  Clinical Data: Chest pain.  PORTABLE CHEST - 1 VIEW  Comparison: Chest x-ray 04/13/2011.  Findings: Lungs appear hyperexpanded  with pruning of the pulmonary vasculature in the periphery, likely indicative of underlying COPD. In the lateral aspect of the left lung base there is a new opacity that is somewhat nodular in appearance measuring approximately 1.9 cm in diameter.  Lungs otherwise appear clear.  Specifically, no definite consolidative airspace disease or pleural effusions.  No evidence of underlying edema.  Mild bilateral apical pleural parenchymal thickening is unchanged and most compatible with scarring.  Heart size is within normal limits. The patient is rotated to the left on today's exam, resulting in distortion of the mediastinal contours and reduced diagnostic sensitivity and specificity for mediastinal pathology.  Atherosclerotic calcifications within the arch of the aorta.  IMPRESSION: 1.  New peripheral nodular opacity in the inferior aspect of the left lung (within either the lingula or the left lower lobe). Given the patient's history of chest pain, this could represent a focus of early infection, or an area of hemorrhage from a small pulmonary infarction.  Alternatively, this could be neoplastic in origin (which would be unusual given the rapid development compared to December 2012).  Clinical correlation is recommended.  If there is any consideration for pulmonary embolism, follow-up PE protocol CT scan would be recommended at this time. 2. Appearance of the lungs is compatible with COPD, as above. 3.  Atherosclerosis.  These results were called by telephone on 09/06/2011  at  03:45 p.m. to  Dr. Bebe Shaggy, who verbally acknowledged these results.  Original Report Authenticated By: Florencia Reasons, M.D.    Assessment/Plan Present on Admission:  71 year old female with atypical chest pain with abnormal CAT scan of her chest concerning for pulmonary emboli also has a history of coronary artery disease  .Chest pain .Chronic kidney disease .Hypertension .Anemia .Arteriosclerotic cardiovascular disease  (ASCVD)  We'll place on telemetry serial cardiac enzymes. She has chronic kidney disease her baseline creatinine is around 1.8-2 and today it is 1.5. Contrast was not using her CAT scan today because of that. I will provide her with some IV fluids overnight and hydrate her up  and hopefully be able to get a CAT scan with contrast tomorrow if not we'll need to get a VQ scan to help better determine the abnormality in her CAT scan. On going to cover her with low-dose Lovenox at this point until that's clarify. We'll obtain pulmonary consult. Further recommendations depending on the above response to treatment.   Plato Alspaugh A [phone] 09/06/2011, 7:20 PM

## 2011-09-06 NOTE — ED Notes (Signed)
Pt ready to go upstairs. 

## 2011-09-06 NOTE — ED Notes (Signed)
Pt reports having left side cp that started yesterday she took 2 sl ntg and felt better, pain cont. Today and has taken 1 sl ntg, then decided to come for eval, +sob, +smoker, +stent placement  apporx 7 years ago.

## 2011-09-06 NOTE — ED Provider Notes (Signed)
History     CSN: [id number]  Arrival date & time 09/06/11  1503   First MD Initiated Contact with Patient 09/06/11 1518      Chief Complaint  Patient presents with  . Chest Pain     Patient is a 71 y.o. female presenting with chest pain. The history is provided by the patient.  Chest Pain The chest pain began yesterday. Episode Length: several hours. Chest pain occurs constantly. The chest pain is improving. The pain is associated with stress. The severity of the pain is moderate. The quality of the pain is described as pressure-like and sharp. The pain does not radiate. Chest pain is worsened by stress. Primary symptoms include shortness of breath, nausea and vomiting.  Pertinent negatives for associated symptoms include no diaphoresis. She tried nitroglycerin and aspirin for the symptoms.   Pt reports relief with NTG but the pain is returning She has h/o CAD and has had similar pain in the past  She has taken ASA at home She reports the pain is sharp at times as well   Past Medical History  Diagnosis Date  . Lymphoma   . Systemic lupus erythematosus   . Depression   . Hypertension   . Hyperlipidemia   . 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 Sjogren's syndrome   . Orthostatic hypotension   . Dysphagia   . Syncope   . Tobacco abuse   . Weight loss   . Anemia     Past Surgical History  Procedure Date  . Breast excisional biopsy     Left  . Eye surgery   . Mass excision     Lymphoma  . Coronary angioplasty with stent placement   . Cholecystectomy     Family History  Problem Relation Age of Onset  . Cancer Other   . Coronary artery disease      History  Substance Use Topics  . Smoking status: Current Everyday Smoker -- 0.5 packs/day for 23 years    Types: Cigarettes  . Smokeless tobacco: Not on file  . Alcohol Use: No    OB History    Grav Para Term Preterm  Abortions TAB SAB Ect Mult Living                  Review of Systems  Constitutional: Negative for diaphoresis.  Respiratory: Positive for shortness of breath.   Cardiovascular: Positive for chest pain.  Gastrointestinal: Positive for nausea and vomiting.  All other systems reviewed and are negative.    Allergies  Florinef; Sulfonamide derivatives; and Tape  Home Medications   Current Outpatient Rx  Name Route Sig Dispense Refill  . ALPRAZOLAM 0.5 MG PO TABS Oral Take 0.5 mg by mouth 3 (three) times daily.     Marland Kitchen AMITRIPTYLINE HCL 100 MG PO TABS Oral Take 100 mg by mouth at bedtime.      . ASPIRIN EC 81 MG PO TBEC Oral Take 81 mg by mouth at bedtime.     . ATORVASTATIN CALCIUM 40 MG PO TABS Oral Take 40 mg by mouth at bedtime.     Marland Kitchen BIOTIN 5000 MCG PO CAPS Oral Take 5,000 mcg by mouth daily.     Geoffry Paradise SENSITIVE OP Ophthalmic Apply to eye as needed.      Marland Kitchen VITAMIN D 400 UNITS PO CAPS Oral Take 400 Units by mouth daily.      Marland Kitchen  CLOBETASOL PROPIONATE 0.05 % EX CREA Topical Apply 0.05 application topically 2 (two) times daily.      Marland Kitchen FLUDROCORTISONE ACETATE 0.1 MG PO TABS Oral Take 1 tablet (0.1 mg total) by mouth 2 (two) times daily. 60 tablet 6  . FOLIC ACID 1 MG PO TABS Oral Take 1 mg by mouth daily.      Marland Kitchen HYDROCODONE-ACETAMINOPHEN 5-500 MG PO TABS Oral Take 1 tablet by mouth every 6 (six) hours as needed.    Marland Kitchen HYDROXYCHLOROQUINE SULFATE 200 MG PO TABS Oral Take 200 mg by mouth daily.      Marland Kitchen LEVOTHYROXINE SODIUM 25 MCG PO TABS Oral Take 25 mcg by mouth every morning.     Marland Kitchen PRESERVISION/LUTEIN PO CAPS Oral Take 1 capsule by mouth 2 (two) times daily.     Marland Kitchen NITROGLYCERIN 0.4 MG SL SUBL Sublingual Place 1 tablet (0.4 mg total) under the tongue every 5 (five) minutes as needed. 100 tablet 6  . FISH OIL 1000 MG PO CAPS Oral Take 1 capsule by mouth at bedtime.     . OMEPRAZOLE 20 MG PO CPDR Oral Take 20 mg by mouth 2 (two) times daily.     Marland Kitchen POTASSIUM CHLORIDE CRYS ER 20 MEQ PO TBCR  Oral Take 10 mEq by mouth 2 (two) times daily.     Marland Kitchen RALOXIFENE HCL 60 MG PO TABS Oral Take 60 mg by mouth daily.      . SODIUM BICARBONATE 325 MG PO TABS Oral Take 325 mg by mouth daily.        BP 113/96  Pulse 78  Temp(Src) 98.2 F (36.8 C) (Oral)  Resp 20  SpO2 100%  Physical Exam CONSTITUTIONAL: Well developed/well nourished HEAD AND FACE: Normocephalic/atraumatic EYES: EOMI/PERRL ENMT: Mucous membranes moist NECK: supple no meningeal signs SPINE:entire spine nontender CV: S1/S2 noted, no murmurs/rubs/gallops noted LUNGS: Lungs are clear to auscultation bilaterally, no apparent distress ABDOMEN: soft, nontender, no rebound or guarding GU:no cva tenderness NEURO: Pt is awake/alert, moves all extremitiesx4 EXTREMITIES: pulses normal, full ROM, no edema noted SKIN: warm, color normal PSYCH: no abnormalities of mood noted  ED Course  Procedures    Labs Reviewed  CBC  DIFFERENTIAL  BASIC METABOLIC PANEL   6:29 PM Pt with h/o CAD She has stent to RCA Last cath is from 2007 She has taken ASA today 3:50 PM D/w radiology, ?opacity in lungs could be nodule vs. Infection vs. Hemorrhage from PE If can tolerate IV contrast, CT chest recommended.  CT noncontrast may be helpful if nodule 4:48 PM D/w radiology, her Creat is improved to receive low dose of IV contrast to r/o PE given CXR 7:18 PM Pt not able to tolerate IV contrast CT noncon Chest shows likely pulm infarction concern for PE Will need V/Q study Pt pain free at this time D/w dr Onalee Hua, will admit for monitoring and will need v/q study Will give one dose of lovenox in case of PE Pt denies any recent GI bleed   MDM  Nursing notes reviewed and considered in documentation xrays reviewed and considered Previous records reviewed and considered All labs/vitals reviewed and considered        Date: 09/06/2011  Rate: 71  Rhythm: normal sinus rhythm  QRS Axis: normal  Intervals: normal  ST/T Wave  abnormalities: normal  Conduction Disutrbances:none  Narrative Interpretation:   Old EKG Reviewed: unchanged    Joya Gaskins, MD 09/06/11 1920

## 2011-09-07 LAB — CARDIAC PANEL(CRET KIN+CKTOT+MB+TROPI)
CK, MB: 2 ng/mL (ref 0.3–4.0)
CK, MB: 2.1 ng/mL (ref 0.3–4.0)
Troponin I: <0.3 ng/mL (ref <0.30)

## 2011-09-07 LAB — CBC
Hemoglobin: 9.2 g/dL — ABNORMAL LOW (ref 12.0–15.0)
MCH: 32.3 pg (ref 26.0–34.0)
RBC: 2.85 MIL/uL — ABNORMAL LOW (ref 3.87–5.11)

## 2011-09-07 LAB — BASIC METABOLIC PANEL
BUN: 15 mg/dL (ref 6–23)
Chloride: 115 mEq/L — ABNORMAL HIGH (ref 96–112)
Creatinine, Ser: 1.36 mg/dL — ABNORMAL HIGH (ref 0.50–1.10)
GFR calc Af Amer: 45 mL/min — ABNORMAL LOW (ref >90)
GFR calc non Af Amer: 38 mL/min — ABNORMAL LOW (ref >90)
GFR calc non Af Amer: 39 mL/min — ABNORMAL LOW (ref >90)
Glucose, Bld: 83 mg/dL (ref 70–99)
Potassium: 3.3 mEq/L — ABNORMAL LOW (ref 3.5–5.1)
Potassium: 3.5 mEq/L (ref 3.5–5.1)
Sodium: 143 mEq/L (ref 135–145)

## 2011-09-07 LAB — PROTIME-INR
INR: 1.15 (ref 0.00–1.49)
Prothrombin Time: 14.9 seconds (ref 11.6–15.2)

## 2011-09-07 MED ORDER — ENOXAPARIN SODIUM 60 MG/0.6ML ~~LOC~~ SOLN
60.0000 mg | Freq: Two times a day (BID) | SUBCUTANEOUS | Status: DC
  Administered 2011-09-07 – 2011-09-09 (×5): 60 mg via SUBCUTANEOUS
  Filled 2011-09-07 (×5): qty 0.6

## 2011-09-07 MED ORDER — SODIUM CHLORIDE 0.9 % IJ SOLN
INTRAMUSCULAR | Status: AC
  Administered 2011-09-07: 10 mL
  Filled 2011-09-07: qty 3

## 2011-09-07 NOTE — Consult Note (Signed)
ANTICOAGULATION CONSULT NOTE - Initial Consult  Pharmacy Consult for Lovenox Indication: VTE treatment, r/o PE  Allergies  Allergen Reactions  . Florinef (Fludrocortisone Acetate)     Malaise: Discomfort   . Iohexol Other (See Comments)    Pt. States that Dr. Lowell Guitar her kidney Dr. Does not want her to receive iv dye due to increased risk of kidney failure.   . Sulfonamide Derivatives Hives  . Tape Rash    ADHESIVE    Patient Measurements: Weight: 141 lb 8.6 oz (64.2 kg)  Vital Signs: Temp: 97.8 F (36.6 C) (04/27 0518) BP: 145/71 mmHg (04/27 0518) Pulse Rate: 65  (04/27 0518)  Labs:  Alvira Philips 09/07/11 0615 09/06/11 2214 09/06/11 1530  HGB 9.2* -- 10.1*  HCT 27.8* -- 30.2*  PLT 141* -- 159  APTT -- -- --  LABPROT 14.9 -- --  INR 1.15 -- --  HEPARINUNFRC -- -- --  CREATININE 1.38* -- 1.50*  CKTOTAL 44 55 --  CKMB 2.0 2.0 --  TROPONINI <0.30 <0.30 --   The CrCl is unknown because both a height and weight (above a minimum accepted value) are required for this calculation.  Medical History: Past Medical History  Diagnosis Date  . Lymphoma   . Systemic lupus erythematosus   . Depression   . Hyperlipidemia   . 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 Sjogren's syndrome   . Orthostatic hypotension   . Dysphagia   . Syncope   . Tobacco abuse   . Weight loss   . Anemia   . Hypertension     hypotension    Medications:  Scheduled:    . ALPRAZolam  0.5 mg Oral TID  . amitriptyline  100 mg Oral QHS  . antiseptic oral rinse  15 mL Mouth Rinse BID  . aspirin EC  81 mg Oral QHS  . atorvastatin  40 mg Oral QHS  . enoxaparin  1 mg/kg Subcutaneous Once  . enoxaparin (LOVENOX) injection  60 mg Subcutaneous Q12H  . fludrocortisone  0.1 mg Oral BID  . levothyroxine  25 mcg Oral QAC breakfast  . raloxifene  60 mg Oral Daily  . sodium chloride  500 mL Intravenous Once  . sodium  chloride  3 mL Intravenous Q12H  . DISCONTD: enoxaparin  60 mg Subcutaneous Q12H    Assessment: SCr improving Goal of Therapy:  Full anticoagulation with Lovenox, R/O PE   Plan: Lovenox 1mg /Kg sq q12hrs Monitor renal fxn and CBC  Maureen Duesing A 09/07/2011,8:16 AM

## 2011-09-07 NOTE — Consult Note (Signed)
[id number] 

## 2011-09-07 NOTE — Consult Note (Signed)
Tiffany [name], Tiffany [name]                ACCOUNT NO.:  [id number]  MEDICAL RECORD NO.:  [id number]  LOCATION:  A302                          FACILITY:  [facility]  PHYSICIAN:  Ronell Duffus L. Juanetta Gosling, M.D.DATE OF BIRTH:  Dec 03, 1940  DATE OF CONSULTATION: DATE OF DISCHARGE:                                CONSULTATION   Patient of the hospitalist.  Ms. Hanna was admitted with chest discomfort.  She has history of coronary disease and she developed chest pain about 24 hours prior to admission.  This pain has been to some extent pleuritic, but not totally.  She has not had cough, congestion and she says the pain feels somewhat like it did when she had cardiac problems, but not exactly. She has not coughed up any blood.  She has not had any fever or chills.  PAST MEDICAL HISTORY:  Positive for hypertension, hyperlipidemia, a previous cardiac event, a form of lupus with Sjogren's syndrome, history of syncope.  PAST SURGICAL HISTORY:  Surgically, she has had a biopsy of her breast. She has had a excision of a lymphoma, an angioplasty and a cholecystectomy.  SOCIAL HISTORY:  Her social history shows that she has a approximately 20 pack year smoking history.  She does not use any alcohol.  FAMILY HISTORY:  Positive for cancer and coronary disease in multiple family members.  REVIEW OF SYSTEMS:  Except as mentioned is negative.  PHYSICAL EXAMINATION:  GENERAL:  Well-developed, thin female, who is in no acute distress now. HEENT:  Her pupils are reactive.  Nose and throat are clear.  Mucous membranes are moist. CHEST:  Relatively clear. HEART:  Regular without gallop. ABDOMEN:  Soft.  No masses are felt. EXTREMITIES:  No clubbing, cyanosis or edema. CENTRAL NERVOUS SYSTEM:  Grossly intact.  LABORATORY WORK:  Her potassium was 3.2, creatinine 1.5, white count 3900, hemoglobin of 10 and platelets 159.  CT done without contrast because of her creatinine showed linear and wedge-shaped opacities  in the left lower lobe, concerning for pulmonary infarction because they did not look like typical pneumonia or atelectasis.  There was also a 4 mm right upper lobe nodule.  ASSESSMENT:  Then is that, she has chest pain which is atypical.  She does have a history of coronary artery occlusive disease.  She has abnormalities that maybe problems with a pulmonary embolus.  My assessment is that she has a possible pulmonary embolus.  PLAN:  I would agree with full anticoagulation until she can have the ventilation perfusion lung scan.  In the meantime, I would continue with current treatments.  If her ventilation-perfusion lung scan is not high probability, then I would proceed with treatment for pneumonia.  Thank you for allowing me to see her with you.     Zayvian Mcmurtry L. Juanetta Gosling, M.D.     ELH/MEDQ  D:  09/07/2011  T:  09/07/2011  Job:  [id number]

## 2011-09-07 NOTE — Progress Notes (Signed)
Subjective: This is a lady who came in with chest pain yesterday. The chest pain was largely in the center of her chest and radiates to the left side. She describes it as a crushing pain but on the other hand she also says that it had sharp-like qualities. It did not get worse with breathing. There was no associated nausea, sweating or dyspnea. Her pain is now completely resolved. CT scan of her chest without contrast was suggestive of possible, infarct on the left side. However, due to her renal function, CT angiogram was not able to be done.           Physical Exam: Blood pressure 145/71, pulse 65, temperature 97.8 F (36.6 C), temperature source Oral, resp. rate 20, weight 64.2 kg (141 lb 8.6 oz), SpO2 98.00%. She looks systemically well. She does not have increased work of breathing. There is no peripheral central cyanosis. Lung fields are entirely clear with no evidence of pleural rub, crackles or bronchial breathing. Heart sounds are present without murmurs or pericardial rub. She is alert and orientated. Her abdomen is soft and nontender.   Investigations:     Basic Metabolic Panel:  Basename 09/07/11 0615 09/06/11 1530  NA 143 140  K 3.3* 3.2*  CL 115* 109  CO2 21 22  GLUCOSE 83 82  BUN 15 18  CREATININE 1.38* 1.50*  CALCIUM 8.5 9.0  MG -- --  PHOS -- --       CBC:  Basename 09/07/11 0615 09/06/11 1530  WBC 2.7* 3.9*  NEUTROABS -- 2.8  HGB 9.2* 10.1*  HCT 27.8* 30.2*  MCV 97.5 97.1  PLT 141* 159    Ct Chest Wo Contrast  09/06/2011  *RADIOLOGY REPORT*  Clinical Data: Shortness of breath.  Coronary artery stent. Chronic kidney disease.  CT CHEST WITHOUT CONTRAST  Technique:  Multidetector CT imaging of the chest was performed following the standard protocol without IV contrast.  Comparison: Chest radiographs, the most recent obtained earlier today.  Report of the previous limited chest CT dated 02/03/2001.  Findings: Linear and wedge shaped opacities in the  left lower lobe. 4 mm noncalcified nodule in the right upper lobe on image number 15.  Minimal linear density at the right lung base in the right lower lobe and minimal linear density anteriorly in the left upper lobe.  Biapical pleural and parenchymal scarring.  No enlarged lymph nodes.  Small hiatal hernia.  Prominent AP diameter of the chest with some flattening of the hemidiaphragms.  Thoracic spine degenerative changes.  IMPRESSION:  1.  Linear and wedge shaped opacities in the left lower lobe. These are concerning for the possibility of areas of pulmonary infarction.  These do not have the typical appearances of pneumonia or atelectasis.  If pulmonary embolism is a clinical concern and the patient is not a candidate for the chest CTA, this can be further evaluated with nuclear medicine ventilation and perfusion lung scan. 2.  4 mm right upper lobe nodule.  If the patient is at high risk for bronchogenic carcinoma, follow-up chest CT at 6-12 months is recommended.  If the patient is at low risk for bronchogenic carcinoma, follow-up chest CT at 12 months is recommended.  This recommendation follows the consensus statement: Guidelines for Management of Small Pulmonary Nodules Detected on CT Scans: A Statement from the Fleischner Society as published in Radiology 2005; 237:395-400.   3.  Small linear areas of atelectasis or scarring in the right lower lobe and left upper lobe.  4.  Mild changes of COPD. 5.  Bilateral renal calculi. 6.  Mild left hydronephrosis or possibly due to a left ureteral calculus, not included. 7.  Small hiatal hernia.  Original Report Authenticated By: Darrol Angel, M.D.   Dg Chest Port 1 View  09/06/2011  *RADIOLOGY REPORT*  Clinical Data: Chest pain.  PORTABLE CHEST - 1 VIEW  Comparison: Chest x-ray 04/13/2011.  Findings: Lungs appear hyperexpanded with pruning of the pulmonary vasculature in the periphery, likely indicative of underlying COPD. In the lateral aspect of the left lung  base there is a new opacity that is somewhat nodular in appearance measuring approximately 1.9 cm in diameter.  Lungs otherwise appear clear.  Specifically, no definite consolidative airspace disease or pleural effusions.  No evidence of underlying edema.  Mild bilateral apical pleural parenchymal thickening is unchanged and most compatible with scarring.  Heart size is within normal limits. The patient is rotated to the left on today's exam, resulting in distortion of the mediastinal contours and reduced diagnostic sensitivity and specificity for mediastinal pathology.  Atherosclerotic calcifications within the arch of the aorta.  IMPRESSION: 1.  New peripheral nodular opacity in the inferior aspect of the left lung (within either the lingula or the left lower lobe). Given the patient's history of chest pain, this could represent a focus of early infection, or an area of hemorrhage from a small pulmonary infarction.  Alternatively, this could be neoplastic in origin (which would be unusual given the rapid development compared to December 2012).  Clinical correlation is recommended.  If there is any consideration for pulmonary embolism, follow-up PE protocol CT scan would be recommended at this time. 2. Appearance of the lungs is compatible with COPD, as above. 3.  Atherosclerosis.  These results were called by telephone on 09/06/2011  at  03:45 p.m. to  Dr. Bebe Shaggy, who verbally acknowledged these results.  Original Report Authenticated By: Florencia Reasons, M.D.      Medications:  Scheduled:   . ALPRAZolam  0.5 mg Oral TID  . amitriptyline  100 mg Oral QHS  . antiseptic oral rinse  15 mL Mouth Rinse BID  . aspirin EC  81 mg Oral QHS  . atorvastatin  40 mg Oral QHS  . enoxaparin  1 mg/kg Subcutaneous Once  . enoxaparin  60 mg Subcutaneous Q12H  . fludrocortisone  0.1 mg Oral BID  . levothyroxine  25 mcg Oral QAC breakfast  . raloxifene  60 mg Oral Daily  . sodium chloride  500 mL Intravenous  Once  . sodium chloride  3 mL Intravenous Q12H    Impression: 1. Chest pain, unclear etiology. She does have a history of cardiac disease, her serial cardiac enzymes are negative. She does not appear to have pneumonia. Possible pulmonary embolism/infarct. 2. Chronic kidney disease. 3. 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 4. Hypertension.     Plan: 1. Increase IV fluids to see if her creatinine and improved to some degree that we can try to do a CT angiogram of the chest. If not, she will need a VQ scan. This is unlikely to happen in the weekend. 2. Start on full dose Lovenox for the time being.     LOS: 1 day   Wilson Singer Pager [phone]  09/07/2011, 7:58 AM

## 2011-09-08 ENCOUNTER — Inpatient Hospital Stay (HOSPITAL_COMMUNITY): Payer: PRIVATE HEALTH INSURANCE

## 2011-09-08 ENCOUNTER — Encounter (HOSPITAL_COMMUNITY): Payer: Self-pay

## 2011-09-08 LAB — BASIC METABOLIC PANEL
Calcium: 8.7 mg/dL (ref 8.4–10.5)
Creatinine, Ser: 1.37 mg/dL — ABNORMAL HIGH (ref 0.50–1.10)
GFR calc Af Amer: 44 mL/min — ABNORMAL LOW (ref >90)
GFR calc non Af Amer: 38 mL/min — ABNORMAL LOW (ref >90)

## 2011-09-08 LAB — CBC
Platelets: 138 10*3/uL — ABNORMAL LOW (ref 150–400)
RDW: 18 % — ABNORMAL HIGH (ref 11.5–15.5)
WBC: 2.5 10*3/uL — ABNORMAL LOW (ref 4.0–10.5)

## 2011-09-08 MED ORDER — MOXIFLOXACIN HCL 400 MG PO TABS
400.0000 mg | ORAL_TABLET | Freq: Every day | ORAL | Status: DC
  Administered 2011-09-08: 400 mg via ORAL
  Filled 2011-09-08: qty 1

## 2011-09-08 MED ORDER — POTASSIUM CHLORIDE CRYS ER 10 MEQ PO TBCR
10.0000 meq | EXTENDED_RELEASE_TABLET | Freq: Two times a day (BID) | ORAL | Status: DC
  Administered 2011-09-08 – 2011-09-09 (×2): 10 meq via ORAL
  Filled 2011-09-08 (×2): qty 1

## 2011-09-08 MED ORDER — FLUDROCORTISONE ACETATE 0.1 MG PO TABS
ORAL_TABLET | ORAL | Status: AC
  Filled 2011-09-08: qty 1

## 2011-09-08 MED ORDER — HYDROXYCHLOROQUINE SULFATE 200 MG PO TABS
200.0000 mg | ORAL_TABLET | Freq: Every day | ORAL | Status: DC
  Administered 2011-09-08 – 2011-09-09 (×2): 200 mg via ORAL
  Filled 2011-09-08 (×4): qty 1

## 2011-09-08 MED ORDER — POTASSIUM CHLORIDE CRYS ER 20 MEQ PO TBCR
40.0000 meq | EXTENDED_RELEASE_TABLET | Freq: Once | ORAL | Status: AC
  Administered 2011-09-08: 40 meq via ORAL
  Filled 2011-09-08: qty 2

## 2011-09-08 MED ORDER — XENON XE 133 GAS
12.0000 | GAS_FOR_INHALATION | Freq: Once | RESPIRATORY_TRACT | Status: AC | PRN
  Administered 2011-09-08: 12 via RESPIRATORY_TRACT

## 2011-09-08 MED ORDER — OCUVITE-LUTEIN PO CAPS
1.0000 | ORAL_CAPSULE | Freq: Two times a day (BID) | ORAL | Status: DC
  Administered 2011-09-08 – 2011-09-09 (×3): 1 via ORAL
  Filled 2011-09-08 (×3): qty 1

## 2011-09-08 MED ORDER — BIOTIN 5000 MCG PO CAPS
5000.0000 ug | ORAL_CAPSULE | Freq: Every day | ORAL | Status: DC
Start: 2011-09-08 — End: 2011-09-08

## 2011-09-08 MED ORDER — CHOLECALCIFEROL 10 MCG (400 UNIT) PO TABS
400.0000 [IU] | ORAL_TABLET | Freq: Every day | ORAL | Status: DC
  Administered 2011-09-08 – 2011-09-09 (×2): 400 [IU] via ORAL
  Filled 2011-09-08 (×4): qty 1

## 2011-09-08 MED ORDER — PRESERVISION/LUTEIN PO CAPS: 1.0000 | ORAL_CAPSULE | Freq: Two times a day (BID) | ORAL | Status: DC

## 2011-09-08 MED ORDER — SODIUM BICARBONATE 650 MG PO TABS
325.0000 mg | ORAL_TABLET | Freq: Every day | ORAL | Status: DC
  Administered 2011-09-08 – 2011-09-09 (×2): 325 mg via ORAL
  Filled 2011-09-08: qty 2
  Filled 2011-09-08: qty 1

## 2011-09-08 MED ORDER — FOLIC ACID 1 MG PO TABS
1.0000 mg | ORAL_TABLET | Freq: Every day | ORAL | Status: DC
  Administered 2011-09-08 – 2011-09-09 (×2): 1 mg via ORAL
  Filled 2011-09-08 (×2): qty 1

## 2011-09-08 MED ORDER — DOCUSATE SODIUM 100 MG PO CAPS
100.0000 mg | ORAL_CAPSULE | Freq: Every day | ORAL | Status: DC
  Administered 2011-09-08 – 2011-09-09 (×2): 100 mg via ORAL
  Filled 2011-09-08 (×2): qty 1

## 2011-09-08 MED ORDER — TECHNETIUM TO 99M ALBUMIN AGGREGATED
3.0000 | Freq: Once | INTRAVENOUS | Status: AC | PRN
  Administered 2011-09-08: 3 via INTRAVENOUS

## 2011-09-08 NOTE — Progress Notes (Signed)
Subjective: She says she feels well. She has no new complaints. She is not having any chest pain.  Objective: Vital signs in last 24 hours: Temp:  [98 F (36.7 C)-98.2 F (36.8 C)] 98 F (36.7 C) (04/28 0459) Pulse Rate:  [61-80] 80  (04/28 0459) Resp:  [20] 20  (04/28 0459) BP: (93-136)/(57-70) 128/67 mmHg (04/28 0459) SpO2:  [97 %-98 %] 97 % (04/28 0459) Weight change:  Last BM Date: 09/04/11  Intake/Output from previous day: 04/27 0701 - 04/28 0700 In: 1520.4 [P.O.:360; I.V.:1160.4] Out: -   PHYSICAL EXAM General appearance: alert, cooperative and no distress Resp: clear to auscultation bilaterally Cardio: regular rate and rhythm, S1, S2 normal, no murmur, click, rub or gallop GI: soft, non-tender; bowel sounds normal; no masses,  no organomegaly Extremities: extremities normal, atraumatic, no cyanosis or edema  Lab Results:    Basic Metabolic Panel:  Basename 09/08/11 0640 09/07/11 1200  NA 141 139  K 3.4* 3.5  CL 114* 110  CO2 22 22  GLUCOSE 94 105*  BUN 13 15  CREATININE 1.37* 1.36*  CALCIUM 8.7 8.7  MG -- --  PHOS -- --   Liver Function Tests: No results found for this basename: AST:2,ALT:2,ALKPHOS:2,BILITOT:2,PROT:2,ALBUMIN:2 in the last 72 hours No results found for this basename: LIPASE:2,AMYLASE:2 in the last 72 hours No results found for this basename: AMMONIA:2 in the last 72 hours CBC:  Basename 09/08/11 0640 09/07/11 0615 09/06/11 1530  WBC 2.5* 2.7* --  NEUTROABS -- -- 2.8  HGB 8.7* 9.2* --  HCT 26.9* 27.8* --  MCV 99.3 97.5 --  PLT 138* 141* --   Cardiac Enzymes:  Basename 09/07/11 1200 09/07/11 0615 09/06/11 2214  CKTOTAL 53 44 55  CKMB 2.1 2.0 2.0  CKMBINDEX -- -- --  TROPONINI <0.30 <0.30 <0.30   BNP: No results found for this basename: PROBNP:3 in the last 72 hours D-Dimer: No results found for this basename: DDIMER:2 in the last 72 hours CBG: No results found for this basename: GLUCAP:6 in the last 72 hours Hemoglobin  A1C: No results found for this basename: HGBA1C in the last 72 hours Fasting Lipid Panel: No results found for this basename: CHOL,HDL,LDLCALC,TRIG,CHOLHDL,LDLDIRECT in the last 72 hours Thyroid Function Tests: No results found for this basename: TSH,T4TOTAL,FREET4,T3FREE,THYROIDAB in the last 72 hours Anemia Panel: No results found for this basename: VITAMINB12,FOLATE,FERRITIN,TIBC,IRON,RETICCTPCT in the last 72 hours Coagulation:  Basename 09/07/11 0615  LABPROT 14.9  INR 1.15   Urine Drug Screen: Drugs of Abuse  No results found for this basename: labopia, cocainscrnur, labbenz, amphetmu, thcu, labbarb    Alcohol Level: No results found for this basename: ETH:2 in the last 72 hours Urinalysis: No results found for this basename: COLORURINE:2,APPERANCEUR:2,LABSPEC:2,PHURINE:2,GLUCOSEU:2,HGBUR:2,BILIRUBINUR:2,KETONESUR:2,PROTEINUR:2,UROBILINOGEN:2,NITRITE:2,LEUKOCYTESUR:2 in the last 72 hours Misc. Labs:  ABGS No results found for this basename: PHART,PCO2,PO2ART,TCO2,HCO3 in the last 72 hours CULTURES No results found for this or any previous visit (from the past 240 hour(s)). Studies/Results: Ct Chest Wo Contrast  09/06/2011  *RADIOLOGY REPORT*  Clinical Data: Shortness of breath.  Coronary artery stent. Chronic kidney disease.  CT CHEST WITHOUT CONTRAST  Technique:  Multidetector CT imaging of the chest was performed following the standard protocol without IV contrast.  Comparison: Chest radiographs, the most recent obtained earlier today.  Report of the previous limited chest CT dated 02/03/2001.  Findings: Linear and wedge shaped opacities in the left lower lobe. 4 mm noncalcified nodule in the right upper lobe on image number 15.  Minimal linear density at the  right lung base in the right lower lobe and minimal linear density anteriorly in the left upper lobe.  Biapical pleural and parenchymal scarring.  No enlarged lymph nodes.  Small hiatal hernia.  Prominent AP diameter of the  chest with some flattening of the hemidiaphragms.  Thoracic spine degenerative changes.  IMPRESSION:  1.  Linear and wedge shaped opacities in the left lower lobe. These are concerning for the possibility of areas of pulmonary infarction.  These do not have the typical appearances of pneumonia or atelectasis.  If pulmonary embolism is a clinical concern and the patient is not a candidate for the chest CTA, this can be further evaluated with nuclear medicine ventilation and perfusion lung scan. 2.  4 mm right upper lobe nodule.  If the patient is at high risk for bronchogenic carcinoma, follow-up chest CT at 6-12 months is recommended.  If the patient is at low risk for bronchogenic carcinoma, follow-up chest CT at 12 months is recommended.  This recommendation follows the consensus statement: Guidelines for Management of Small Pulmonary Nodules Detected on CT Scans: A Statement from the Fleischner Society as published in Radiology 2005; 237:395-400.   3.  Small linear areas of atelectasis or scarring in the right lower lobe and left upper lobe. 4.  Mild changes of COPD. 5.  Bilateral renal calculi. 6.  Mild left hydronephrosis or possibly due to a left ureteral calculus, not included. 7.  Small hiatal hernia.  Original Report Authenticated By: Darrol Angel, M.D.   Dg Chest Port 1 View  09/06/2011  *RADIOLOGY REPORT*  Clinical Data: Chest pain.  PORTABLE CHEST - 1 VIEW  Comparison: Chest x-ray 04/13/2011.  Findings: Lungs appear hyperexpanded with pruning of the pulmonary vasculature in the periphery, likely indicative of underlying COPD. In the lateral aspect of the left lung base there is a new opacity that is somewhat nodular in appearance measuring approximately 1.9 cm in diameter.  Lungs otherwise appear clear.  Specifically, no definite consolidative airspace disease or pleural effusions.  No evidence of underlying edema.  Mild bilateral apical pleural parenchymal thickening is unchanged and most  compatible with scarring.  Heart size is within normal limits. The patient is rotated to the left on today's exam, resulting in distortion of the mediastinal contours and reduced diagnostic sensitivity and specificity for mediastinal pathology.  Atherosclerotic calcifications within the arch of the aorta.  IMPRESSION: 1.  New peripheral nodular opacity in the inferior aspect of the left lung (within either the lingula or the left lower lobe). Given the patient's history of chest pain, this could represent a focus of early infection, or an area of hemorrhage from a small pulmonary infarction.  Alternatively, this could be neoplastic in origin (which would be unusual given the rapid development compared to December 2012).  Clinical correlation is recommended.  If there is any consideration for pulmonary embolism, follow-up PE protocol CT scan would be recommended at this time. 2. Appearance of the lungs is compatible with COPD, as above. 3.  Atherosclerosis.  These results were called by telephone on 09/06/2011  at  03:45 p.m. to  Dr. Bebe Shaggy, who verbally acknowledged these results.  Original Report Authenticated By: Florencia Reasons, M.D.    Medications:  Scheduled:   . ALPRAZolam  0.5 mg Oral TID  . amitriptyline  100 mg Oral QHS  . antiseptic oral rinse  15 mL Mouth Rinse BID  . aspirin EC  81 mg Oral QHS  . atorvastatin  40 mg Oral  QHS  . cholecalciferol  400 Units Oral Daily  . enoxaparin (LOVENOX) injection  60 mg Subcutaneous Q12H  . fludrocortisone  0.1 mg Oral BID  . folic acid  1 mg Oral Daily  . hydroxychloroquine  200 mg Oral Daily  . levothyroxine  25 mcg Oral QAC breakfast  . moxifloxacin  400 mg Oral q1800  . multivitamin-lutein  1 capsule Oral BID  . potassium chloride SA  10 mEq Oral BID  . potassium chloride  40 mEq Oral Once  . raloxifene  60 mg Oral Daily  . sodium bicarbonate  325 mg Oral Daily  . sodium chloride  3 mL Intravenous Q12H  . sodium chloride      .  DISCONTD: Biotin  5,000 mcg Oral Daily  . DISCONTD: PreserVision/Lutein  1 capsule Oral BID   Continuous:   . 0.9 % NaCl with KCl 20 mEq / L 125 mL/hr at 09/07/11 0758   [id number]  Assesment: She has chest pain. She has abnormalities on her chest CT. These may represent pulmonary embolus but could also be pneumonia Principal Problem:  *Chest pain Active Problems:  Hypertension  Arteriosclerotic cardiovascular disease (ASCVD)  Anemia  Chronic kidney disease    Plan: I discussed her situation with Dr. Karilyn Cota and were going to start antibiotics while we are awaiting the ventilation perfusion lung scan    LOS: 2 days   Aleasha Fregeau L 09/08/2011, 9:56 AM

## 2011-09-08 NOTE — Progress Notes (Signed)
Subjective: This lady remains chest pain-free. Unfortunately her renal function has not improved to a normal level and I suspect her current creatinine is reflective of her chronic kidney disease.          Physical Exam: Blood pressure 128/67, pulse 80, temperature 98 F (36.7 C), temperature source Oral, resp. rate 20, weight 64.2 kg (141 lb 8.6 oz), SpO2 97.00%. She looks systemically well. She does not have increased work of breathing. There is no peripheral central cyanosis. Lung fields are entirely clear with no evidence of pleural rub, crackles or bronchial breathing. Heart sounds are present without murmurs or pericardial rub. She is alert and orientated. Her abdomen is soft and nontender.   Investigations:     Basic Metabolic Panel:  Basename 09/08/11 0640 09/07/11 1200  NA 141 139  K 3.4* 3.5  CL 114* 110  CO2 22 22  GLUCOSE 94 105*  BUN 13 15  CREATININE 1.37* 1.36*  CALCIUM 8.7 8.7  MG -- --  PHOS -- --       CBC:  Basename 09/08/11 0640 09/07/11 0615 09/06/11 1530  WBC 2.5* 2.7* --  NEUTROABS -- -- 2.8  HGB 8.7* 9.2* --  HCT 26.9* 27.8* --  MCV 99.3 97.5 --  PLT 138* 141* --    Ct Chest Wo Contrast  09/06/2011  *RADIOLOGY REPORT*  Clinical Data: Shortness of breath.  Coronary artery stent. Chronic kidney disease.  CT CHEST WITHOUT CONTRAST  Technique:  Multidetector CT imaging of the chest was performed following the standard protocol without IV contrast.  Comparison: Chest radiographs, the most recent obtained earlier today.  Report of the previous limited chest CT dated 02/03/2001.  Findings: Linear and wedge shaped opacities in the left lower lobe. 4 mm noncalcified nodule in the right upper lobe on image number 15.  Minimal linear density at the right lung base in the right lower lobe and minimal linear density anteriorly in the left upper lobe.  Biapical pleural and parenchymal scarring.  No enlarged lymph nodes.  Small hiatal hernia.  Prominent AP  diameter of the chest with some flattening of the hemidiaphragms.  Thoracic spine degenerative changes.  IMPRESSION:  1.  Linear and wedge shaped opacities in the left lower lobe. These are concerning for the possibility of areas of pulmonary infarction.  These do not have the typical appearances of pneumonia or atelectasis.  If pulmonary embolism is a clinical concern and the patient is not a candidate for the chest CTA, this can be further evaluated with nuclear medicine ventilation and perfusion lung scan. 2.  4 mm right upper lobe nodule.  If the patient is at high risk for bronchogenic carcinoma, follow-up chest CT at 6-12 months is recommended.  If the patient is at low risk for bronchogenic carcinoma, follow-up chest CT at 12 months is recommended.  This recommendation follows the consensus statement: Guidelines for Management of Small Pulmonary Nodules Detected on CT Scans: A Statement from the Fleischner Society as published in Radiology 2005; 237:395-400.   3.  Small linear areas of atelectasis or scarring in the right lower lobe and left upper lobe. 4.  Mild changes of COPD. 5.  Bilateral renal calculi. 6.  Mild left hydronephrosis or possibly due to a left ureteral calculus, not included. 7.  Small hiatal hernia.  Original Report Authenticated By: Darrol Angel, M.D.   Dg Chest Port 1 View  09/06/2011  *RADIOLOGY REPORT*  Clinical Data: Chest pain.  PORTABLE CHEST - 1 VIEW  Comparison: Chest x-ray 04/13/2011.  Findings: Lungs appear hyperexpanded with pruning of the pulmonary vasculature in the periphery, likely indicative of underlying COPD. In the lateral aspect of the left lung base there is a new opacity that is somewhat nodular in appearance measuring approximately 1.9 cm in diameter.  Lungs otherwise appear clear.  Specifically, no definite consolidative airspace disease or pleural effusions.  No evidence of underlying edema.  Mild bilateral apical pleural parenchymal thickening is unchanged  and most compatible with scarring.  Heart size is within normal limits. The patient is rotated to the left on today's exam, resulting in distortion of the mediastinal contours and reduced diagnostic sensitivity and specificity for mediastinal pathology.  Atherosclerotic calcifications within the arch of the aorta.  IMPRESSION: 1.  New peripheral nodular opacity in the inferior aspect of the left lung (within either the lingula or the left lower lobe). Given the patient's history of chest pain, this could represent a focus of early infection, or an area of hemorrhage from a small pulmonary infarction.  Alternatively, this could be neoplastic in origin (which would be unusual given the rapid development compared to December 2012).  Clinical correlation is recommended.  If there is any consideration for pulmonary embolism, follow-up PE protocol CT scan would be recommended at this time. 2. Appearance of the lungs is compatible with COPD, as above. 3.  Atherosclerosis.  These results were called by telephone on 09/06/2011  at  03:45 p.m. to  Dr. Bebe Shaggy, who verbally acknowledged these results.  Original Report Authenticated By: Florencia Reasons, M.D.      Medications:  Scheduled:    . ALPRAZolam  0.5 mg Oral TID  . amitriptyline  100 mg Oral QHS  . antiseptic oral rinse  15 mL Mouth Rinse BID  . aspirin EC  81 mg Oral QHS  . atorvastatin  40 mg Oral QHS  . Biotin  5,000 mcg Oral Daily  . enoxaparin (LOVENOX) injection  60 mg Subcutaneous Q12H  . fludrocortisone  0.1 mg Oral BID  . folic acid  1 mg Oral Daily  . hydroxychloroquine  200 mg Oral Daily  . levothyroxine  25 mcg Oral QAC breakfast  . potassium chloride SA  10 mEq Oral BID  . potassium chloride  40 mEq Oral Once  . PreserVision/Lutein  1 capsule Oral BID  . raloxifene  60 mg Oral Daily  . sodium bicarbonate  325 mg Oral Daily  . sodium chloride  3 mL Intravenous Q12H  . sodium chloride      . Vitamin D  400 Units Oral Daily     Impression: 1. Chest pain, unclear etiology. She does have a history of cardiac disease, her serial cardiac enzymes are negative. She does not appear to have pneumonia. Possible pulmonary embolism/infarct. 2. Chronic kidney disease. 3. 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 4. Hypertension.     Plan: 1. Continue with full dose Lovenox. 2. VQ scan as we will not be able to do a CT angiogram based on her renal function. I do not think a VQ scan will be able to be done today, probably tomorrow.     LOS: 2 days   Wilson Singer Pager [phone]  09/08/2011, 8:35 AM

## 2011-09-09 ENCOUNTER — Inpatient Hospital Stay (HOSPITAL_COMMUNITY): Payer: PRIVATE HEALTH INSURANCE

## 2011-09-09 LAB — BASIC METABOLIC PANEL
Chloride: 112 mEq/L (ref 96–112)
GFR calc Af Amer: 41 mL/min — ABNORMAL LOW (ref >90)
Potassium: 3.4 mEq/L — ABNORMAL LOW (ref 3.5–5.1)

## 2011-09-09 LAB — CBC
HCT: 27.1 % — ABNORMAL LOW (ref 36.0–46.0)
Hemoglobin: 8.7 g/dL — ABNORMAL LOW (ref 12.0–15.0)
RDW: 18.1 % — ABNORMAL HIGH (ref 11.5–15.5)
WBC: 2.5 10*3/uL — ABNORMAL LOW (ref 4.0–10.5)

## 2011-09-09 MED ORDER — ENOXAPARIN SODIUM 60 MG/0.6ML ~~LOC~~ SOLN: 1.5000 mg/kg | Freq: Every day | SUBCUTANEOUS | Status: DC

## 2011-09-09 MED ORDER — WARFARIN SODIUM 5 MG PO TABS: 5.0000 mg | ORAL_TABLET | Freq: Every day | ORAL | Status: DC

## 2011-09-09 MED ORDER — ENOXAPARIN SODIUM 40 MG/0.4ML ~~LOC~~ SOLN
35.0000 mg | Freq: Once | SUBCUTANEOUS | Status: DC
  Filled 2011-09-09 (×2): qty 0.4

## 2011-09-09 MED ORDER — ENOXAPARIN (LOVENOX) PATIENT EDUCATION KIT
PACK | Freq: Once | Status: AC
  Administered 2011-09-09: 13:00:00
  Filled 2011-09-09: qty 1

## 2011-09-09 MED ORDER — MOXIFLOXACIN HCL 400 MG PO TABS: 400.0000 mg | ORAL_TABLET | Freq: Every day | ORAL | Status: AC

## 2011-09-09 MED ORDER — ENOXAPARIN SODIUM 40 MG/0.4ML ~~LOC~~ SOLN
40.0000 mg | Freq: Once | SUBCUTANEOUS | Status: AC
  Administered 2011-09-09: 40 mg via SUBCUTANEOUS

## 2011-09-09 NOTE — Consult Note (Signed)
ANTICOAGULATION CONSULT NOTE   Pharmacy Consult for Lovenox Indication: VTE treatment, r/o PE  Allergies  Allergen Reactions  . Florinef (Fludrocortisone Acetate)     Malaise: Discomfort   . Iohexol Other (See Comments)    Pt. States that Dr. Lowell Guitar her kidney Dr. Does not want her to receive iv dye due to increased risk of kidney failure.   . Sulfonamide Derivatives Hives  . Tape Rash    ADHESIVE   Patient Measurements: Weight: 141 lb 8.6 oz (64.2 kg)  Vital Signs: Temp: 98 F (36.7 C) (04/29 0629) Temp src: Oral (04/29 0629) BP: 119/66 mmHg (04/29 0629) Pulse Rate: 64  (04/29 0629)  Labs:  Alvira Philips 09/09/11 0446 09/08/11 0640 09/07/11 1200 09/07/11 0615 09/06/11 2214  HGB 8.7* 8.7* -- -- --  HCT 27.1* 26.9* -- 27.8* --  PLT 150 138* -- 141* --  APTT -- -- -- -- --  LABPROT -- -- -- 14.9 --  INR -- -- -- 1.15 --  HEPARINUNFRC -- -- -- -- --  CREATININE 1.48* 1.37* 1.36* -- --  CKTOTAL -- -- 53 44 55  CKMB -- -- 2.1 2.0 2.0  TROPONINI -- -- <0.30 <0.30 <0.30   The CrCl is unknown because both a height and weight (above a minimum accepted value) are required for this calculation.  Medical History: Past Medical History  Diagnosis Date  . Lymphoma   . Systemic lupus erythematosus   . Depression   . Hyperlipidemia   . 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 Sjogren's syndrome   . Orthostatic hypotension   . Dysphagia   . Syncope   . Tobacco abuse   . Weight loss   . Anemia   . Hypertension     hypotension   Medications:  Scheduled:     . ALPRAZolam  0.5 mg Oral TID  . amitriptyline  100 mg Oral QHS  . antiseptic oral rinse  15 mL Mouth Rinse BID  . aspirin EC  81 mg Oral QHS  . atorvastatin  40 mg Oral QHS  . cholecalciferol  400 Units Oral Daily  . docusate sodium  100 mg Oral Daily  . enoxaparin (LOVENOX) injection  60 mg Subcutaneous Q12H  . fludrocortisone   0.1 mg Oral BID  . folic acid  1 mg Oral Daily  . hydroxychloroquine  200 mg Oral Daily  . levothyroxine  25 mcg Oral QAC breakfast  . moxifloxacin  400 mg Oral q1800  . multivitamin-lutein  1 capsule Oral BID  . potassium chloride SA  10 mEq Oral BID  . potassium chloride  40 mEq Oral Once  . raloxifene  60 mg Oral Daily  . sodium bicarbonate  325 mg Oral Daily  . sodium chloride  3 mL Intravenous Q12H  . DISCONTD: Biotin  5,000 mcg Oral Daily   Assessment: SCr rising slightly  Goal of Therapy:  Full anticoagulation with Lovenox, R/O PE   Plan: Lovenox 1mg /Kg sq q12hrs F/U studies for PE Monitor renal fxn and CBC  Karess Harner A 09/09/2011,8:59 AM

## 2011-09-09 NOTE — Progress Notes (Signed)
Subjective: She is overall about the same. Her ventilation perfusion lung scan was intermediate probability. Unfortunately her CT which was done without contrast is consistent with pulmonary infarction and her history could be consistent with that or with pneumonia. She is currently being treated for both  Objective: Vital signs in last 24 hours: Temp:  [98 F (36.7 C)-98.3 F (36.8 C)] 98 F (36.7 C) (04/29 0629) Pulse Rate:  [64-74] 64  (04/29 0629) Resp:  [20] 20  (04/29 0629) BP: (119-147)/(66-78) 119/66 mmHg (04/29 0629) SpO2:  [98 %-99 %] 98 % (04/29 0629) Weight change:  Last BM Date: 09/04/11  Intake/Output from previous day: 04/28 0701 - 04/29 0700 In: 360 [P.O.:360] Out: -   PHYSICAL EXAM General appearance: alert, cooperative and no distress Resp: rhonchi bilaterally Cardio: regular rate and rhythm, S1, S2 normal, no murmur, click, rub or gallop GI: soft, non-tender; bowel sounds normal; no masses,  no organomegaly Extremities: extremities normal, atraumatic, no cyanosis or edema  Lab Results:    Basic Metabolic Panel:  Basename 09/09/11 0446 09/08/11 0640  NA 141 141  K 3.4* 3.4*  CL 112 114*  CO2 23 22  GLUCOSE 86 94  BUN 13 13  CREATININE 1.48* 1.37*  CALCIUM 8.9 8.7  MG -- --  PHOS -- --   Liver Function Tests: No results found for this basename: AST:2,ALT:2,ALKPHOS:2,BILITOT:2,PROT:2,ALBUMIN:2 in the last 72 hours No results found for this basename: LIPASE:2,AMYLASE:2 in the last 72 hours No results found for this basename: AMMONIA:2 in the last 72 hours CBC:  Basename 09/09/11 0446 09/08/11 0640 09/06/11 1530  WBC 2.5* 2.5* --  NEUTROABS -- -- 2.8  HGB 8.7* 8.7* --  HCT 27.1* 26.9* --  MCV 98.9 99.3 --  PLT 150 138* --   Cardiac Enzymes:  Basename 09/07/11 1200 09/07/11 0615 09/06/11 2214  CKTOTAL 53 44 55  CKMB 2.1 2.0 2.0  CKMBINDEX -- -- --  TROPONINI <0.30 <0.30 <0.30   BNP: No results found for this basename: PROBNP:3 in the  last 72 hours D-Dimer: No results found for this basename: DDIMER:2 in the last 72 hours CBG: No results found for this basename: GLUCAP:6 in the last 72 hours Hemoglobin A1C: No results found for this basename: HGBA1C in the last 72 hours Fasting Lipid Panel: No results found for this basename: CHOL,HDL,LDLCALC,TRIG,CHOLHDL,LDLDIRECT in the last 72 hours Thyroid Function Tests: No results found for this basename: TSH,T4TOTAL,FREET4,T3FREE,THYROIDAB in the last 72 hours Anemia Panel: No results found for this basename: VITAMINB12,FOLATE,FERRITIN,TIBC,IRON,RETICCTPCT in the last 72 hours Coagulation:  Basename 09/07/11 0615  LABPROT 14.9  INR 1.15   Urine Drug Screen: Drugs of Abuse  No results found for this basename: labopia, cocainscrnur, labbenz, amphetmu, thcu, labbarb    Alcohol Level: No results found for this basename: ETH:2 in the last 72 hours Urinalysis: No results found for this basename: COLORURINE:2,APPERANCEUR:2,LABSPEC:2,PHURINE:2,GLUCOSEU:2,HGBUR:2,BILIRUBINUR:2,KETONESUR:2,PROTEINUR:2,UROBILINOGEN:2,NITRITE:2,LEUKOCYTESUR:2 in the last 72 hours Misc. Labs:  ABGS No results found for this basename: PHART,PCO2,PO2ART,TCO2,HCO3 in the last 72 hours CULTURES No results found for this or any previous visit (from the past 240 hour(s)). Studies/Results: Nm Pulmonary Per & Vent  09/08/2011  *RADIOLOGY REPORT*  Clinical Data: Evaluate for pulmonary embolus.  NM PULMONARY VENTILATION AND PERFUSION SCAN  Radiopharmaceutical: CURIE MAA TECHNETIUM TO 57M ALBUMIN AGGREGATED, CURIE xenon xe 133 gas 12 milli Curie XENON XE 133 GAS  Comparison: Chest CT from 09/06/2011  Findings: There is a single large peripheral and segmental perfusion defect within the left lung base.  This corresponds to an area of airspace consolidation within the left lower lobe.  No ventilation abnormalities identified.  The remaining portions lungs are demonstrated normal perfusion.   IMPRESSION:  1.  Single unmatched, large perfusion defect within the left base with corresponding chest radiograph abnormality.  This is consistent with an intermediate probability VQ scan.  Original Report Authenticated By: Rosealee Albee, M.D.    Medications:  Scheduled:   . ALPRAZolam  0.5 mg Oral TID  . amitriptyline  100 mg Oral QHS  . antiseptic oral rinse  15 mL Mouth Rinse BID  . aspirin EC  81 mg Oral QHS  . atorvastatin  40 mg Oral QHS  . cholecalciferol  400 Units Oral Daily  . docusate sodium  100 mg Oral Daily  . enoxaparin (LOVENOX) injection  60 mg Subcutaneous Q12H  . fludrocortisone  0.1 mg Oral BID  . folic acid  1 mg Oral Daily  . hydroxychloroquine  200 mg Oral Daily  . levothyroxine  25 mcg Oral QAC breakfast  . moxifloxacin  400 mg Oral q1800  . multivitamin-lutein  1 capsule Oral BID  . potassium chloride SA  10 mEq Oral BID  . potassium chloride  40 mEq Oral Once  . raloxifene  60 mg Oral Daily  . sodium bicarbonate  325 mg Oral Daily  . sodium chloride  3 mL Intravenous Q12H  . DISCONTD: Biotin  5,000 mcg Oral Daily  . DISCONTD: PreserVision/Lutein  1 capsule Oral BID   Continuous:  [id number], technetium albumin aggregated, xenon xe 133  Assesment: Unfortunately with her having an intermediate probability of ventilation perfusion scan we're still in his situation we do not have a definite diagnosis. She is marginal for having a CT angiogram as far as her renal function is concerned. I think we should check a venous study of her legs because if she has DVT then we know we have to treat her with full anti-coagulation. If she does not show changes in her legs it may be the safest thing to treat her as if this is a pulmonary embolus but continue with antibiotics as well Principal Problem:  *Chest pain Active Problems:  Hypertension  Arteriosclerotic cardiovascular disease (ASCVD)  Anemia  Chronic kidney disease    Plan: As above    LOS: 3 days    Michol Emory L 09/09/2011, 8:29 AM

## 2011-09-09 NOTE — Discharge Summary (Signed)
Physician Discharge Summary  Patient ID: Tiffany [name] MRN: [id number] DOB/AGE: [birth year] 71 y.o. Primary Care Physician:QURESHI,AYYAZ, MD, MD Admit date: 09/06/2011 Discharge date: 09/09/2011    Discharge Diagnoses:  1. Chest pain, probable left-sided pulmonary embolism. VQ scan can shows intermediate probability, venous Doppler of both legs negative. 2. Lung infection, on antibiotics. 3. Hypertension. 4. Chronic atrial disease, stable. 5. Chronic kidney disease, stable. 6. Anemia of chronic disease.   Medication List  As of 09/09/2011 10:56 AM   TAKE these medications         ALPRAZolam 0.5 MG tablet   Commonly known as: XANAX   Take 0.5 mg by mouth 3 (three) times daily.      amitriptyline 100 MG tablet   Commonly known as: ELAVIL   Take 100 mg by mouth at bedtime.      aspirin EC 81 MG tablet   Take 81 mg by mouth at bedtime.      atorvastatin 40 MG tablet   Commonly known as: LIPITOR   Take 40 mg by mouth at bedtime.      Biotin 5000 MCG Caps   Take 5,000 mcg by mouth daily.      clobetasol cream 0.05 %   Commonly known as: TEMOVATE   Apply 0.05 application topically 2 (two) times daily.      enoxaparin 60 MG/0.6ML injection   Commonly known as: LOVENOX   Inject 0.95 mLs (95 mg total) into the skin daily.      Fish Oil 1000 MG Caps   Take 1 capsule by mouth at bedtime.      fludrocortisone 0.1 MG tablet   Commonly known as: FLORINEF   Take 1 tablet (0.1 mg total) by mouth 2 (two) times daily.      folic acid 1 MG tablet   Commonly known as: FOLVITE   Take 1 mg by mouth daily.      HYDROcodone-acetaminophen 5-500 MG per tablet   Commonly known as: VICODIN   Take 1 tablet by mouth every 6 (six) hours as needed.      hydroxychloroquine 200 MG tablet   Commonly known as: PLAQUENIL   Take 200 mg by mouth daily.      levothyroxine 25 MCG tablet   Commonly known as: SYNTHROID, LEVOTHROID   Take 25 mcg by mouth every morning.      moxifloxacin 400 MG  tablet   Commonly known as: AVELOX   Take 1 tablet (400 mg total) by mouth daily at 6 PM.      nitroGLYCERIN 0.4 MG SL tablet   Commonly known as: NITROSTAT   Place 1 tablet (0.4 mg total) under the tongue every 5 (five) minutes as needed.      omeprazole 20 MG capsule   Commonly known as: PRILOSEC   Take 20 mg by mouth 2 (two) times daily.      OPTIVE SENSITIVE OP   Apply to eye as needed.      potassium chloride SA 20 MEQ tablet   Commonly known as: K-DUR,KLOR-CON   Take 10 mEq by mouth 2 (two) times daily.      PreserVision/Lutein Caps   Take 1 capsule by mouth 2 (two) times daily.      raloxifene 60 MG tablet   Commonly known as: EVISTA   Take 60 mg by mouth daily.      sodium bicarbonate 325 MG tablet   Take 325 mg by mouth daily.      Vitamin D  400 UNITS capsule   Take 400 Units by mouth daily.      warfarin 5 MG tablet   Commonly known as: COUMADIN   Take 1 tablet (5 mg total) by mouth daily.            Discharged Condition: Stable and improved.    Consults: Pulmonology, Dr. Juanetta Gosling.  Significant Diagnostic Studies: Ct Chest Wo Contrast  09/06/2011  *RADIOLOGY REPORT*  Clinical Data: Shortness of breath.  Coronary artery stent. Chronic kidney disease.  CT CHEST WITHOUT CONTRAST  Technique:  Multidetector CT imaging of the chest was performed following the standard protocol without IV contrast.  Comparison: Chest radiographs, the most recent obtained earlier today.  Report of the previous limited chest CT dated 02/03/2001.  Findings: Linear and wedge shaped opacities in the left lower lobe. 4 mm noncalcified nodule in the right upper lobe on image number 15.  Minimal linear density at the right lung base in the right lower lobe and minimal linear density anteriorly in the left upper lobe.  Biapical pleural and parenchymal scarring.  No enlarged lymph nodes.  Small hiatal hernia.  Prominent AP diameter of the chest with some flattening of the hemidiaphragms.   Thoracic spine degenerative changes.  IMPRESSION:  1.  Linear and wedge shaped opacities in the left lower lobe. These are concerning for the possibility of areas of pulmonary infarction.  These do not have the typical appearances of pneumonia or atelectasis.  If pulmonary embolism is a clinical concern and the patient is not a candidate for the chest CTA, this can be further evaluated with nuclear medicine ventilation and perfusion lung scan. 2.  4 mm right upper lobe nodule.  If the patient is at high risk for bronchogenic carcinoma, follow-up chest CT at 6-12 months is recommended.  If the patient is at low risk for bronchogenic carcinoma, follow-up chest CT at 12 months is recommended.  This recommendation follows the consensus statement: Guidelines for Management of Small Pulmonary Nodules Detected on CT Scans: A Statement from the Fleischner Society as published in Radiology 2005; 237:395-400.   3.  Small linear areas of atelectasis or scarring in the right lower lobe and left upper lobe. 4.  Mild changes of COPD. 5.  Bilateral renal calculi. 6.  Mild left hydronephrosis or possibly due to a left ureteral calculus, not included. 7.  Small hiatal hernia.  Original Report Authenticated By: Darrol Angel, M.D.   Nm Pulmonary Per & Vent  09/08/2011  *RADIOLOGY REPORT*  Clinical Data: Evaluate for pulmonary embolus.  NM PULMONARY VENTILATION AND PERFUSION SCAN  Radiopharmaceutical: CURIE MAA TECHNETIUM TO 68M ALBUMIN AGGREGATED, CURIE xenon xe 133 gas 12 milli Curie XENON XE 133 GAS  Comparison: Chest CT from 09/06/2011  Findings: There is a single large peripheral and segmental perfusion defect within the left lung base.  This corresponds to an area of airspace consolidation within the left lower lobe.  No ventilation abnormalities identified.  The remaining portions lungs are demonstrated normal perfusion.  IMPRESSION:  1.  Single unmatched, large perfusion defect within the left base with  corresponding chest radiograph abnormality.  This is consistent with an intermediate probability VQ scan.  Original Report Authenticated By: Rosealee Albee, M.D.   US Venous Img Lower Bilateral  09/09/2011  *RADIOLOGY REPORT*  Clinical Data: Possible pulmonary embolism.  Evaluate for deep venous thrombosis in the legs.  BILATERAL LOWER EXTREMITY VENOUS DUPLEX ULTRASOUND  Technique:  Gray-scale sonography with graded compression, as  well as color Doppler and duplex ultrasound, were performed to evaluate the deep venous system of both lower extremities from the level of the common femoral vein through the popliteal and proximal calf veins.  Spectral Doppler was utilized to evaluate flow at rest and with distal augmentation maneuvers.  Comparison:  No priors.  Findings:  Normal compressibility of bilateral common femoral, superficial femoral, and popliteal veins is demonstrated, as well as the visualized proximal calf veins.  No filling defects to suggest DVT on grayscale or color Doppler imaging.  Doppler waveforms show normal direction of venous flow, normal respiratory phasicity and response to augmentation.  IMPRESSION: No evidence of deep vein thrombosis in either lower extremity.  Original Report Authenticated By: Florencia Reasons, M.D.   Dg Chest Port 1 View  09/06/2011  *RADIOLOGY REPORT*  Clinical Data: Chest pain.  PORTABLE CHEST - 1 VIEW  Comparison: Chest x-ray 04/13/2011.  Findings: Lungs appear hyperexpanded with pruning of the pulmonary vasculature in the periphery, likely indicative of underlying COPD. In the lateral aspect of the left lung base there is a new opacity that is somewhat nodular in appearance measuring approximately 1.9 cm in diameter.  Lungs otherwise appear clear.  Specifically, no definite consolidative airspace disease or pleural effusions.  No evidence of underlying edema.  Mild bilateral apical pleural parenchymal thickening is unchanged and most compatible with scarring.   Heart size is within normal limits. The patient is rotated to the left on today's exam, resulting in distortion of the mediastinal contours and reduced diagnostic sensitivity and specificity for mediastinal pathology.  Atherosclerotic calcifications within the arch of the aorta.  IMPRESSION: 1.  New peripheral nodular opacity in the inferior aspect of the left lung (within either the lingula or the left lower lobe). Given the patient's history of chest pain, this could represent a focus of early infection, or an area of hemorrhage from a small pulmonary infarction.  Alternatively, this could be neoplastic in origin (which would be unusual given the rapid development compared to December 2012).  Clinical correlation is recommended.  If there is any consideration for pulmonary embolism, follow-up PE protocol CT scan would be recommended at this time. 2. Appearance of the lungs is compatible with COPD, as above. 3.  Atherosclerosis.  These results were called by telephone on 09/06/2011  at  03:45 p.m. to  Dr. Bebe Shaggy, who verbally acknowledged these results.  Original Report Authenticated By: Florencia Reasons, M.D.    Lab Results: Basic Metabolic Panel:  Basename 09/09/11 0446 09/08/11 0640  NA 141 141  K 3.4* 3.4*  CL 112 114*  CO2 23 22  GLUCOSE 86 94  BUN 13 13  CREATININE 1.48* 1.37*  CALCIUM 8.9 8.7  MG -- --  PHOS -- --      CBC:  Basename 09/09/11 0446 09/08/11 0640 09/06/11 1530  WBC 2.5* 2.5* --  NEUTROABS -- -- 2.8  HGB 8.7* 8.7* --  HCT 27.1* 26.9* --  MCV 98.9 99.3 --  PLT 150 138* --       Hospital Course: This very pleasant 71 year old lady was admitted with symptoms of chest pain which started one day prior to hospitalization. The nature of the chest pain was somewhat atypical, some features were cardiac in nature but she was also sharp. Serial cardiac enzymes were negative. In view of her chronic kidney disease, CT angiogram of the chest could not be done.CT scan of  the chest without contrast showed small linear areas of atelectasis or  scarring in the right lower lobe and left upper lobe. There are mild changes of COPD. VQ scan was done which showed intermediate probability for PE. She also underwent venous Dopplers of both legs but there was no evidence of DVT. She had been already started on antibiotics for possible infectious process. After discussion with Dr. Juanetta Gosling, we both agreed that it would be appropriate to treat this lady as if she had a pulmonary embolism and she would require 6 months of treatment. Therefore, she will be sent home on Lovenox and then transitioned onto Coumadin when INR is therapeutic between 2 and 3. She is doing well with no chest pain, dyspnea or palpitations.  Discharge Exam: Blood pressure 119/66, pulse 64, temperature 98 F (36.7 C), temperature source Oral, resp. rate 20, weight 64.2 kg (141 lb 8.6 oz), SpO2 98.00%. Looks systemically well. There is no increased work of breathing. Lung fields are entirely clear with no evidence of pleural rub. Oxygen saturations are appropriate on room air. Heart sounds are present without murmurs. She is alert and orientated.  Disposition: Home. She is being sent home in one week course of antibiotics, Lovenox injections and Coumadin. She'll follow with her primary care physician within a week or so.  Discharge Orders    Future Orders Please Complete By Expires   Diet - low sodium heart healthy      Increase activity slowly           SignedWilson Singer Pager [phone]  09/09/2011, 10:56 AM

## 2011-09-09 NOTE — Progress Notes (Signed)
   CARE MANAGEMENT NOTE 09/09/2011  Patient:  Tiffany [name], Tiffany [name]   Account Number:  [id number]  Date Initiated:  09/09/2011  Documentation initiated by:  Rosemary Holms  Subjective/Objective Assessment:   Pt admitted with Chest pain and SOB. lLives at home with Tiffany [name] from [facility] and a [facility] aide.     Action/Plan:   Plans to DC back home with the same services in place. Alroy Bailiff with [facility] notified to resume [name]. Per Dr. Deirdre Priest, [facility] [name] to check INR on Friday and call to PCP   Anticipated DC Date:  09/09/2011   Anticipated DC Plan:  HOME W HOME HEALTH SERVICES      DC Planning Services  CM consult      Choice offered to / List presented to:          [facility] arranged  HH-1 [name]  HH-4 NURSE'S AIDE      HH agency  [facility]  CAP PROGRAM   Status of service:  Completed, signed off Medicare Important Message given?   (If response is "NO", the following Medicare IM given date fields will be blank) Date Medicare IM given:   Date Additional Medicare IM given:    Discharge Disposition:  HOME W HOME HEALTH SERVICES  Per UR Regulation:    If discussed at Long Length of Stay Meetings, dates discussed:    Comments:  09/09/11 1100 Claressa Hughley Leanord Hawking [name] BSN CM

## 2011-09-12 NOTE — Progress Notes (Signed)
Utilization review completed.  

## 2011-11-28 ENCOUNTER — Ambulatory Visit (INDEPENDENT_AMBULATORY_CARE_PROVIDER_SITE_OTHER): Payer: PRIVATE HEALTH INSURANCE | Admitting: Adult Health

## 2011-11-28 ENCOUNTER — Encounter: Payer: Self-pay | Admitting: Adult Health

## 2011-11-28 VITALS — BP 140/80 | HR 80 | Ht 70.0 in | Wt 144.0 lb

## 2011-11-28 DIAGNOSIS — I7090 Unspecified atherosclerosis: Secondary | ICD-10-CM

## 2011-11-28 DIAGNOSIS — I2699 Other pulmonary embolism without acute cor pulmonale: Secondary | ICD-10-CM

## 2011-11-28 DIAGNOSIS — Z7901 Long term (current) use of anticoagulants: Secondary | ICD-10-CM

## 2011-11-28 DIAGNOSIS — I2510 Atherosclerotic heart disease of native coronary artery without angina pectoris: Secondary | ICD-10-CM

## 2011-11-28 NOTE — Patient Instructions (Addendum)
Your physician recommends that you schedule a follow-up appointment in: 6 months  Your physician recommends that you return for lab work in: Next Thursday  Stool cards x 3 and return to office as soon as possible

## 2011-11-28 NOTE — Assessment & Plan Note (Signed)
Stable at present without recurrent chest pain. She will continue on current medication regimen. Will see her in 6 months unless she is again symptomatic.

## 2011-11-28 NOTE — Progress Notes (Signed)
HPI: Tiffany [name] is a 71 y/o patient of Dr.Rothbart we are following for ongoing assessment of orthostatic hypotension,CAD,hypercholesterolemia, CKD, with history of anemia. She was recently admitted to [facility] with diagnosis of chest pain and probable pulmonary embolus. VQ lung scan showed intermediate probability (unable to do CT because of CKD). She was negative for DVT. She is now on Xarelto 15 mg daily, but has not taken it yet because the medication had to be ordered. She starts this today.  She has no further symptoms of chest pain or DOE. Chest X-Ray did demonstrate mild changes of COPD. Otherwise she is doing well.  Allergies  Allergen Reactions  . Florinef (Fludrocortisone Acetate)     Malaise: Discomfort   . Iohexol Other (See Comments)    Pt. States that Dr. Lowell Guitar her kidney Dr. Does not want her to receive iv dye due to increased risk of kidney failure.   . Sulfonamide Derivatives Hives  . Tape Rash    ADHESIVE    Current Outpatient Prescriptions  Medication Sig Dispense Refill  . ALPRAZolam (XANAX) 0.5 MG tablet Take 0.5 mg by mouth 3 (three) times daily.       Marland Kitchen amitriptyline (ELAVIL) 100 MG tablet Take 100 mg by mouth at bedtime.        Marland Kitchen aspirin EC 81 MG tablet Take 81 mg by mouth at bedtime.       Marland Kitchen atorvastatin (LIPITOR) 40 MG tablet Take 40 mg by mouth at bedtime.       . Biotin 5000 MCG CAPS Take 5,000 mcg by mouth daily.       . Carboxymethylcellul-Glycerin (OPTIVE SENSITIVE OP) Apply to eye as needed.        . Cholecalciferol (VITAMIN D) 400 UNITS capsule Take 400 Units by mouth daily.        . clobetasol cream (TEMOVATE) 0.05 % Apply 0.05 application topically 2 (two) times daily.        . fludrocortisone (FLORINEF) 0.1 MG tablet Take 1 tablet (0.1 mg total) by mouth 2 (two) times daily.  60 tablet  6  . folic acid (FOLVITE) 1 MG tablet Take 1 mg by mouth daily.        Marland Kitchen HYDROcodone-acetaminophen (VICODIN) 5-500 MG per tablet Take 1 tablet by mouth every 6 (six)  hours as needed.      . iron polysaccharides (NIFEREX) 150 MG capsule Take 150 mg by mouth daily.      Marland Kitchen levothyroxine (SYNTHROID, LEVOTHROID) 25 MCG tablet Take 25 mcg by mouth every morning.       . Multiple Vitamins-Minerals (PRESERVISION/LUTEIN) CAPS Take 1 capsule by mouth 2 (two) times daily.       . nitroGLYCERIN (NITROSTAT) 0.4 MG SL tablet Place 1 tablet (0.4 mg total) under the tongue every 5 (five) minutes as needed.  100 tablet  6  . Omega-3 Fatty Acids (FISH OIL) 1000 MG CAPS Take 1 capsule by mouth at bedtime.       Marland Kitchen omeprazole (PRILOSEC) 20 MG capsule Take 20 mg by mouth 2 (two) times daily.       . pilocarpine (SALAGEN) 5 MG tablet Take 5 mg by mouth 3 (three) times daily.      . potassium chloride SA (K-DUR,KLOR-CON) 20 MEQ tablet Take 10 mEq by mouth 2 (two) times daily.       . raloxifene (EVISTA) 60 MG tablet Take 60 mg by mouth daily.        . Rivaroxaban (XARELTO) 15 MG  TABS tablet Take 20 mg by mouth daily.      . sodium bicarbonate 325 MG tablet Take 325 mg by mouth daily.          Past Medical History  Diagnosis Date  . Lymphoma   . Systemic lupus erythematosus   . Depression   . Hyperlipidemia   . 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 Sjogren's syndrome   . Orthostatic hypotension   . Dysphagia   . Syncope   . Tobacco abuse   . Weight loss   . Anemia   . Hypertension     hypotension    Past Surgical History  Procedure Date  . Breast excisional biopsy     Left  . Eye surgery   . Mass excision     Lymphoma  . Coronary angioplasty with stent placement   . Cholecystectomy     ZOX:WRUEAV of systems complete and found to be negative unless listed above  PHYSICAL EXAM BP 140/80  Pulse 80  Ht 5\' 10"  (1.778 m)  Wt 144 lb (65.318 kg)  BMI 20.66 kg/m2  SpO2 97%  General: Well developed, well nourished, in no acute distress, thin. Head: Eyes PERRLA, No  xanthomas.   Normal cephalic and atramatic  Lungs: Clear bilaterally to auscultation and percussion. Heart: HRRR S1 S2, without MRG.  Pulses are 2+ & equal.            No carotid bruit. No JVD.  No abdominal bruits. No femoral bruits. Abdomen: Bowel sounds are positive, abdomen soft and non-tender without masses or                  Hernia's noted. Msk:  Back normal, normal gait. Normal strength and tone for age. Extremities: No clubbing, cyanosis or edema.  DP +1 Neuro: Alert and oriented X 3. Psych:  Good affect, responds appropriately    ASSESSMENT AND PLAN

## 2011-11-28 NOTE — Assessment & Plan Note (Signed)
She is currently on Xarelto but has not started this yet. Will begin today. I will check CBC and BMET for follow-up labs with history of anemia and CKD on this medication. Hemoccult cards will be provided.

## 2011-11-28 NOTE — Progress Notes (Deleted)
Anesthesia Post Note  Patient: Tiffany [name]  Procedure(s) Performed: * No surgery found *  Anesthesia type: {PROCEDURES; ANE POST ANESTHESIA TYPE:19480}  Patient location: {PLACES; ANE RUEA:54098}  Post pain: {FINDINGS; ANE POST JXBJ:47829}  Post assessment: {ASSESSMENT; ANE FAOZ:30865}  Last Vitals:  Filed Vitals:   11/28/11 1333  BP: 140/80  Pulse: 80    Post vital signs: {DESC; ANE POST VITALS:19483}  Level of consciousness: {FINDINGS; ANE POST LEVEL OF CONSCIOUSNESS:19484}  Complications: {FINDINGS; ANE POST COMPLICATIONS:19485}

## 2011-12-06 ENCOUNTER — Ambulatory Visit (INDEPENDENT_AMBULATORY_CARE_PROVIDER_SITE_OTHER): Payer: PRIVATE HEALTH INSURANCE | Admitting: *Deleted

## 2011-12-06 DIAGNOSIS — Z7901 Long term (current) use of anticoagulants: Secondary | ICD-10-CM

## 2011-12-06 LAB — CBC
HCT: 31.5 % — ABNORMAL LOW (ref 36.0–46.0)
Hemoglobin: 10.2 g/dL — ABNORMAL LOW (ref 12.0–15.0)
MCV: 97.5 fL (ref 78.0–100.0)
RBC: 3.23 MIL/uL — ABNORMAL LOW (ref 3.87–5.11)
RDW: 15.1 % (ref 11.5–15.5)
WBC: 5.3 10*3/uL (ref 4.0–10.5)

## 2011-12-06 LAB — BASIC METABOLIC PANEL
BUN: 22 mg/dL (ref 6–23)
CO2: 23 mEq/L (ref 19–32)
Chloride: 109 mEq/L (ref 96–112)
Creat: 1.73 mg/dL — ABNORMAL HIGH (ref 0.50–1.10)
Potassium: 3.5 mEq/L (ref 3.5–5.3)

## 2011-12-11 ENCOUNTER — Ambulatory Visit: Payer: PRIVATE HEALTH INSURANCE | Admitting: Cardiology

## 2012-01-29 ENCOUNTER — Encounter: Payer: Self-pay | Admitting: Cardiology

## 2012-01-29 DIAGNOSIS — Z7901 Long term (current) use of anticoagulants: Secondary | ICD-10-CM | POA: Insufficient documentation

## 2012-02-18 ENCOUNTER — Other Ambulatory Visit: Payer: Self-pay

## 2012-02-18 MED ORDER — FLUDROCORTISONE ACETATE 0.1 MG PO TABS: 0.1000 mg | ORAL_TABLET | Freq: Two times a day (BID) | ORAL | Status: DC

## 2012-04-10 ENCOUNTER — Emergency Department (HOSPITAL_COMMUNITY): Payer: PRIVATE HEALTH INSURANCE

## 2012-04-10 ENCOUNTER — Emergency Department (HOSPITAL_COMMUNITY)
Admission: EM | Admit: 2012-04-10 | Discharge: 2012-04-10 | Disposition: A | Discharge status: Home / Self Care | Payer: PRIVATE HEALTH INSURANCE | Attending: Emergency Medicine | Admitting: Emergency Medicine

## 2012-04-10 ENCOUNTER — Encounter (HOSPITAL_COMMUNITY): Payer: Self-pay | Admitting: *Deleted

## 2012-04-10 DIAGNOSIS — R079 Chest pain, unspecified: Secondary | ICD-10-CM | POA: Insufficient documentation

## 2012-04-10 DIAGNOSIS — F3289 Other specified depressive episodes: Secondary | ICD-10-CM | POA: Insufficient documentation

## 2012-04-10 DIAGNOSIS — R5381 Other malaise: Secondary | ICD-10-CM | POA: Insufficient documentation

## 2012-04-10 DIAGNOSIS — I10 Essential (primary) hypertension: Secondary | ICD-10-CM | POA: Insufficient documentation

## 2012-04-10 DIAGNOSIS — R9431 Abnormal electrocardiogram [ECG] [EKG]: Secondary | ICD-10-CM | POA: Insufficient documentation

## 2012-04-10 DIAGNOSIS — E785 Hyperlipidemia, unspecified: Secondary | ICD-10-CM | POA: Insufficient documentation

## 2012-04-10 DIAGNOSIS — Z7901 Long term (current) use of anticoagulants: Secondary | ICD-10-CM | POA: Insufficient documentation

## 2012-04-10 DIAGNOSIS — F329 Major depressive disorder, single episode, unspecified: Secondary | ICD-10-CM | POA: Insufficient documentation

## 2012-04-10 DIAGNOSIS — R112 Nausea with vomiting, unspecified: Secondary | ICD-10-CM | POA: Insufficient documentation

## 2012-04-10 DIAGNOSIS — Z9861 Coronary angioplasty status: Secondary | ICD-10-CM | POA: Insufficient documentation

## 2012-04-10 DIAGNOSIS — R42 Dizziness and giddiness: Secondary | ICD-10-CM | POA: Insufficient documentation

## 2012-04-10 DIAGNOSIS — M3500 Sicca syndrome, unspecified: Secondary | ICD-10-CM | POA: Insufficient documentation

## 2012-04-10 DIAGNOSIS — R300 Dysuria: Secondary | ICD-10-CM | POA: Insufficient documentation

## 2012-04-10 DIAGNOSIS — Z8589 Personal history of malignant neoplasm of other organs and systems: Secondary | ICD-10-CM | POA: Insufficient documentation

## 2012-04-10 DIAGNOSIS — F17200 Nicotine dependence, unspecified, uncomplicated: Secondary | ICD-10-CM | POA: Insufficient documentation

## 2012-04-10 DIAGNOSIS — K5900 Constipation, unspecified: Secondary | ICD-10-CM | POA: Insufficient documentation

## 2012-04-10 DIAGNOSIS — D649 Anemia, unspecified: Secondary | ICD-10-CM | POA: Insufficient documentation

## 2012-04-10 DIAGNOSIS — I2510 Atherosclerotic heart disease of native coronary artery without angina pectoris: Secondary | ICD-10-CM | POA: Insufficient documentation

## 2012-04-10 DIAGNOSIS — Z79899 Other long term (current) drug therapy: Secondary | ICD-10-CM | POA: Insufficient documentation

## 2012-04-10 DIAGNOSIS — M329 Systemic lupus erythematosus, unspecified: Secondary | ICD-10-CM | POA: Insufficient documentation

## 2012-04-10 DIAGNOSIS — Z7982 Long term (current) use of aspirin: Secondary | ICD-10-CM | POA: Insufficient documentation

## 2012-04-10 LAB — BASIC METABOLIC PANEL
BUN: 20 mg/dL (ref 6–23)
CO2: 23 mEq/L (ref 19–32)
Calcium: 8.9 mg/dL (ref 8.4–10.5)
Chloride: 110 mEq/L (ref 96–112)
Creatinine, Ser: 1.88 mg/dL — ABNORMAL HIGH (ref 0.50–1.10)

## 2012-04-10 LAB — CBC WITH DIFFERENTIAL/PLATELET
Basophils Relative: 0 % (ref 0–1)
Eosinophils Relative: 6 % — ABNORMAL HIGH (ref 0–5)
HCT: 32.7 % — ABNORMAL LOW (ref 36.0–46.0)
Hemoglobin: 10.5 g/dL — ABNORMAL LOW (ref 12.0–15.0)
Lymphocytes Relative: 27 % (ref 12–46)
MCHC: 32.1 g/dL (ref 30.0–36.0)
MCV: 92.1 fL (ref 78.0–100.0)
Monocytes Absolute: 0.5 10*3/uL (ref 0.1–1.0)
Monocytes Relative: 13 % — ABNORMAL HIGH (ref 3–12)
Neutro Abs: 1.8 10*3/uL (ref 1.7–7.7)

## 2012-04-10 LAB — URINALYSIS, ROUTINE W REFLEX MICROSCOPIC
Bilirubin Urine: NEGATIVE
Glucose, UA: NEGATIVE mg/dL
Ketones, ur: NEGATIVE mg/dL
pH: 6.5 (ref 5.0–8.0)

## 2012-04-10 LAB — TROPONIN I: Troponin I: <0.3 ng/mL (ref <0.30)

## 2012-04-10 LAB — URINE MICROSCOPIC-ADD ON

## 2012-04-10 MED ORDER — ASPIRIN 81 MG PO CHEW
324.0000 mg | CHEWABLE_TABLET | Freq: Once | ORAL | Status: AC
  Administered 2012-04-10: 324 mg via ORAL
  Filled 2012-04-10: qty 4
  Filled 2012-04-10: qty 1

## 2012-04-10 NOTE — ED Notes (Signed)
Non radiating , intermittent CP since yesterday. Pt also states right flank pain x 3 days and recently treated for a UTI.

## 2012-04-10 NOTE — ED Provider Notes (Signed)
History   This chart was scribed for Flint Melter, MD by Melba Coon, ED Scribe. The patient was seen in room APA12/APA12 and the patient's care was started at 4:29PM.    CSN: [id number]  Arrival date & time 04/10/12  1552   First MD Initiated Contact with Patient 04/10/12 1620      Chief Complaint  Patient presents with  . Flank Pain  . Chest Pain    (Consider location/radiation/quality/duration/timing/severity/associated sxs/prior treatment) The history is provided by the patient. No language interpreter was used.   IVIONNA VERLEY is a 71 y.o. female who presents to the Emergency Department complaining of intermittent, moderate to severe non-radiating chest pain with an onset yesterday. Pain yesterday lasted for about 15 min. Severity of pain yesterday was greater than it is today. NTG and ASA around 8:00PM did alleviate the pain last night. She reports nausea and vomit 2x yesterday. She reports chronic constipation and takes stool softener for it. She denies diarrhea. She reports weakness and dizziness this morning when she woke up. She also c/o right flank pain for 3 days and is currently being treated for a UTI; still reports slight dysuria. No other pertinent medical symptoms.  Has not been to her PCP in the last 3 months Has a cardiologist; has had a stress test about 6 months ago; she does not know why it was done   Past Medical History  Diagnosis Date  . Lymphoma   . Systemic lupus erythematosus   . Depression   . Hyperlipidemia   . 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 Sjogren's syndrome   . Orthostatic hypotension   . Dysphagia   . Syncope   . Tobacco abuse   . Weight loss   . Anemia   . Hypertension     hypotension  . Chronic anticoagulation 01/29/2012    Past Surgical History  Procedure Date  . Breast excisional biopsy     Left  . Eye surgery   . Mass excision       Lymphoma  . Coronary angioplasty with stent placement   . Cholecystectomy     Family History  Problem Relation Age of Onset  . Cancer Other   . Coronary artery disease      History  Substance Use Topics  . Smoking status: Current Every Day Smoker -- 0.5 packs/day for 23 years    Types: Cigarettes  . Smokeless tobacco: Not on file  . Alcohol Use: No    OB History    Grav Para Term Preterm Abortions TAB SAB Ect Mult Living                  Review of Systems  Cardiovascular: Positive for chest pain.  Genitourinary: Positive for flank pain.   10 Systems reviewed and all are negative for acute change except as noted in the HPI.   Allergies  Florinef; Iohexol; Sulfonamide derivatives; and Tape  Home Medications   Current Outpatient Rx  Name  Route  Sig  Dispense  Refill  . ALPRAZOLAM 0.5 MG PO TABS   Oral   Take 0.5 mg by mouth 3 (three) times daily.          Marland Kitchen AMITRIPTYLINE HCL 100 MG PO TABS   Oral   Take 100 mg by mouth at bedtime.           Marland Kitchen  ASPIRIN EC 81 MG PO TBEC   Oral   Take 81 mg by mouth at bedtime.          . ATORVASTATIN CALCIUM 40 MG PO TABS   Oral   Take 40 mg by mouth at bedtime.          Marland Kitchen BIOTIN 5000 MCG PO CAPS   Oral   Take 5,000 mcg by mouth every evening.          Geoffry Paradise SENSITIVE OP   Ophthalmic   Apply 1 drop to eye daily.          Marland Kitchen VITAMIN D 400 UNITS PO CAPS   Oral   Take 400 Units by mouth every evening.          Marland Kitchen CLOBETASOL PROPIONATE 0.05 % EX CREA   Topical   Apply 0.05 application topically 2 (two) times daily.           Marland Kitchen DOCUSATE SODIUM 100 MG PO CAPS   Oral   Take 100 mg by mouth at bedtime.         Marland Kitchen FLUDROCORTISONE ACETATE 0.1 MG PO TABS   Oral   Take 1 tablet (0.1 mg total) by mouth 2 (two) times daily.   60 tablet   3   . FOLIC ACID 1 MG PO TABS   Oral   Take 1 mg by mouth every morning.          Marland Kitchen HYDROCODONE-ACETAMINOPHEN 5-500 MG PO TABS   Oral   Take 1 tablet by  mouth every 6 (six) hours as needed. For pain         . POLYSACCHARIDE IRON COMPLEX 150 MG PO CAPS   Oral   Take 150 mg by mouth daily.         Marland Kitchen LEVOTHYROXINE SODIUM 25 MCG PO TABS   Oral   Take 25 mcg by mouth every morning.          Marland Kitchen PRESERVISION/LUTEIN PO CAPS   Oral   Take 1 capsule by mouth 2 (two) times daily.          Marland Kitchen NITROGLYCERIN 0.4 MG SL SUBL   Sublingual   Place 1 tablet (0.4 mg total) under the tongue every 5 (five) minutes as needed.   100 tablet   6   . FISH OIL 1000 MG PO CAPS   Oral   Take 1 capsule by mouth at bedtime.          . OMEPRAZOLE 20 MG PO CPDR   Oral   Take 20 mg by mouth 2 (two) times daily.          Marland Kitchen PILOCARPINE HCL 5 MG PO TABS   Oral   Take 5 mg by mouth 3 (three) times daily.         Marland Kitchen RALOXIFENE HCL 60 MG PO TABS   Oral   Take 60 mg by mouth daily.           . SODIUM BICARBONATE 325 MG PO TABS   Oral   Take 325 mg by mouth 3 (three) times daily.            BP 169/84  Pulse 68  Temp 98.4 F (36.9 C) (Oral)  Resp 18  SpO2 99%  Physical Exam  Nursing note and vitals reviewed. Constitutional:       Awake, alert, nontoxic appearance.  HENT:  Head: Atraumatic.  Mouth/Throat: Mucous membranes are dry.  Eyes: Right  eye exhibits no discharge. Left eye exhibits no discharge.  Neck: Neck supple.  Cardiovascular: Normal rate, regular rhythm and normal heart sounds.   No murmur heard. Pulmonary/Chest: Effort normal and breath sounds normal. She exhibits no tenderness (non-reproducible with palpation).  Abdominal: Soft. There is tenderness (right flank tenderness). There is no rebound.  Musculoskeletal: She exhibits no tenderness.       Baseline ROM, no obvious new focal weakness.  Neurological:       Mental status and motor strength appears baseline for patient and situation.  Skin: No rash noted.  Psychiatric: She has a normal mood and affect.    ED Course  Procedures (including critical care  time)  DIAGNOSTIC STUDIES: Oxygen Saturation is 99% on room air, normal by my interpretation.    COORDINATION OF CARE:  4:34PM - EKG today is slightly changed from last performed EKG. CXR, troponin, CBC with diff, and BMP will be ordered for Mrs Schnoebelen. 4:45PM - imaging reviewed 6:30PM - labs reviewed 7:15PM - Mrs Wollenberg is ready to be d/c. She is advised to f/u with her cardiologist. She is advised to Rx 1 full-strength ASA a day at home.  Prior to the patient leaving, I discussed the case with Dr. Myrtis Ser. He agrees with recommended followup with her cardiologist in a few days.  Labs Reviewed  CBC WITH DIFFERENTIAL - Abnormal; Notable for the following:    WBC 3.5 (*)     RBC 3.55 (*)     Hemoglobin 10.5 (*)     HCT 32.7 (*)     RDW 15.9 (*)     Platelets 149 (*)     Monocytes Relative 13 (*)     Eosinophils Relative 6 (*)     All other components within normal limits  BASIC METABOLIC PANEL - Abnormal; Notable for the following:    Potassium 3.3 (*)     Creatinine, Ser 1.88 (*)     GFR calc non Af Amer 26 (*)     GFR calc Af Amer 30 (*)     All other components within normal limits  URINALYSIS, ROUTINE W REFLEX MICROSCOPIC - Abnormal; Notable for the following:    Protein, ur 30 (*)     All other components within normal limits  TROPONIN I  URINE MICROSCOPIC-ADD ON  URINE CULTURE   Dg Chest Portable 1 View  04/10/2012  *RADIOLOGY REPORT*  Clinical Data: Chest pain.  Right flank pain.  PORTABLE CHEST - 1 VIEW 04/10/2012 1623 hours:  Comparison: Portable chest x-ray and CT chest 09/06/2011.  Two-view chest x-ray 07/21/2010.  Findings: Cardiac silhouette upper normal in size to slightly enlarged but stable.  Thoracic aorta atherosclerotic, unchanged. Hilar and mediastinal contours otherwise unremarkable. Hyperinflation with emphysematous changes in the upper lobes, prominent bronchovascular markings diffusely, and biapical pleuroparenchymal scarring, unchanged.  No new pulmonary  parenchymal abnormalities.  IMPRESSION: Stable borderline to mild cardiomegaly.  Stable COPD/emphysema.  No acute cardiopulmonary disease.   Original Report Authenticated By: Hulan Saas, M.D.      Date: 02/28/2012  Rate: 78  Rhythm: normal sinus rhythm and premature ventricular contractions (PVC)  QRS Axis: normal  PR and QT Intervals: normal  ST/T Wave abnormalities: nonspecific ST changes  PR and QRS Conduction Disutrbances:none  Narrative Interpretation:   Old EKG Reviewed: changes noted- mild ST variance   1. Chest pain   2. Abnormal EKG       MDM  Chest pain, atypical for coronary artery disease. She had a  relatively recent stress test less than 12 months ago. At that time there was no evidence for inducible ischemia. Patient needs followup with her cardiologist, regarding the changed EKG. , Doubt ACS, PE, or pneumonia. Doubt metabolic instability, serious bacterial infection or impending vascular collapse; the patient is stable for discharge.  I personally performed the services described in this documentation, which was scribed in my presence. The recorded information has been reviewed and is accurate.     Plan: Home Medications- usual; Home Treatments- rest; Recommended follow up- Call Cardiologist on Monday to obtain f/u evaluation        Flint Melter, MD 04/11/12 0111

## 2012-04-12 LAB — URINE CULTURE
Colony Count: NO GROWTH
Culture: NO GROWTH

## 2012-04-16 ENCOUNTER — Ambulatory Visit (INDEPENDENT_AMBULATORY_CARE_PROVIDER_SITE_OTHER): Payer: PRIVATE HEALTH INSURANCE | Admitting: Adult Health

## 2012-04-16 ENCOUNTER — Encounter: Payer: Self-pay | Admitting: Adult Health

## 2012-04-16 VITALS — BP 130/78 | HR 82 | Ht 70.5 in | Wt 146.0 lb

## 2012-04-16 DIAGNOSIS — I10 Essential (primary) hypertension: Secondary | ICD-10-CM

## 2012-04-16 DIAGNOSIS — N189 Chronic kidney disease, unspecified: Secondary | ICD-10-CM

## 2012-04-16 DIAGNOSIS — R079 Chest pain, unspecified: Secondary | ICD-10-CM

## 2012-04-16 DIAGNOSIS — E876 Hypokalemia: Secondary | ICD-10-CM

## 2012-04-16 MED ORDER — POTASSIUM CHLORIDE CRYS ER 20 MEQ PO TBCR: 10.0000 meq | EXTENDED_RELEASE_TABLET | Freq: Three times a day (TID) | ORAL | Status: DC

## 2012-04-16 NOTE — Assessment & Plan Note (Signed)
Excellent control of BP. No changes in medications at this time.

## 2012-04-16 NOTE — Progress Notes (Signed)
HPI: Tiffany [name] is a pleasant  71 y/o patient of Dr.Rothbart we are seeing for ongoing assessment and treatment of CAD, hypercholesterolemia, with history of CKD, and anemia. She has a history of PE, but is no longer on anticoagulation. She was in [facility] ER on 04/10/2012 with complaints of right flank pain and back pain. She mentioned to them that she had some chest discomfort the day before. She was being treated for UTI prior to ER visit, by PCP. EKG showed PVC;s with nonspecific T-Wave abnormality. Potassium was low at 3.3. This was not repleted. She comes today feeling better.She took a laxative at home and felt complete relief of symptoms. She denies any complaints at this time.  Allergies  Allergen Reactions  . Florinef (Fludrocortisone Acetate)     Malaise: Discomfort   . Iohexol Other (See Comments)    Pt. States that Dr. Lowell Guitar her kidney Dr. Does not want her to receive iv dye due to increased risk of kidney failure.   . Sulfonamide Derivatives Hives  . Tape Rash    ADHESIVE    Current Outpatient Prescriptions  Medication Sig Dispense Refill  . ALPRAZolam (XANAX) 0.5 MG tablet Take 0.5 mg by mouth 3 (three) times daily.       Marland Kitchen amitriptyline (ELAVIL) 100 MG tablet Take 100 mg by mouth at bedtime.        Marland Kitchen aspirin EC 81 MG tablet Take 81 mg by mouth at bedtime.       Marland Kitchen atorvastatin (LIPITOR) 40 MG tablet Take 40 mg by mouth at bedtime.       . Biotin 5000 MCG CAPS Take 5,000 mcg by mouth every evening.       . Carboxymethylcellul-Glycerin (OPTIVE SENSITIVE OP) Apply 1 drop to eye daily.       . Cholecalciferol (VITAMIN D) 400 UNITS capsule Take 400 Units by mouth every evening.       . clobetasol cream (TEMOVATE) 0.05 % Apply 0.05 application topically 2 (two) times daily.        Marland Kitchen docusate sodium ([employer] STOOL SOFTENER) 100 MG capsule Take 100 mg by mouth at bedtime.      . fludrocortisone (FLORINEF) 0.1 MG tablet Take 1 tablet (0.1 mg total) by mouth 2 (two) times daily.  60  tablet  3  . folic acid (FOLVITE) 1 MG tablet Take 1 mg by mouth every morning.       Marland Kitchen HYDROcodone-acetaminophen (VICODIN) 5-500 MG per tablet Take 1 tablet by mouth every 6 (six) hours as needed. For pain      . iron polysaccharides (NIFEREX) 150 MG capsule Take 150 mg by mouth daily.      Marland Kitchen levothyroxine (SYNTHROID, LEVOTHROID) 25 MCG tablet Take 25 mcg by mouth every morning.       . nitroGLYCERIN (NITROSTAT) 0.4 MG SL tablet Place 1 tablet (0.4 mg total) under the tongue every 5 (five) minutes as needed.  100 tablet  6  . Omega-3 Fatty Acids (FISH OIL) 1000 MG CAPS Take 1 capsule by mouth at bedtime.       Marland Kitchen omeprazole (PRILOSEC) 20 MG capsule Take 20 mg by mouth 2 (two) times daily.       . pilocarpine (SALAGEN) 5 MG tablet Take 5 mg by mouth 3 (three) times daily.      . raloxifene (EVISTA) 60 MG tablet Take 60 mg by mouth daily.        . sodium bicarbonate 325 MG tablet Take 325 mg  by mouth 3 (three) times daily.       . Multiple Vitamins-Minerals (PRESERVISION/LUTEIN) CAPS Take 1 capsule by mouth 2 (two) times daily.       . potassium chloride SA (K-DUR,KLOR-CON) 20 MEQ tablet Take 0.5 tablets (10 mEq total) by mouth 3 (three) times daily.  90 tablet  3    Past Medical History  Diagnosis Date  . Lymphoma   . Systemic lupus erythematosus   . Depression   . Hyperlipidemia   . 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 Sjogren's syndrome   . Orthostatic hypotension   . Dysphagia   . Syncope   . Tobacco abuse   . Weight loss   . Anemia   . Hypertension     hypotension  . Chronic anticoagulation 01/29/2012    Past Surgical History  Procedure Date  . Breast excisional biopsy     Left  . Eye surgery   . Mass excision     Lymphoma  . Coronary angioplasty with stent placement   . Cholecystectomy     NWG:NFAOZH of systems complete and found to be negative unless listed above  PHYSICAL  EXAM BP 130/78  Pulse 82  Ht 5' 10.5" (1.791 m)  Wt 146 lb 0.6 oz (66.243 kg)  BMI 20.66 kg/m2  SpO2 97%  General: Well developed, well nourished, in no acute distress Head: Eyes PERRLA, No xanthomas.   Normal cephalic and atramatic  Lungs: Clear bilaterally to auscultation and percussion. Heart: HRIR S1 S2, soft 1/6 systolic murmur. .  Pulses are 2+ & equal.            No carotid bruit. No JVD.  No abdominal bruits. No femoral bruits. Abdomen: Bowel sounds are positive, abdomen soft and non-tender without masses or                  Hernia's noted. Msk:  Back normal, normal gait. Normal strength and tone for age. Extremities: No clubbing, cyanosis or edema.  DP +1 Neuro: Alert and oriented X 3. Psych:  Good affect, responds appropriately  EKG: Sinus rhythm with frequent PVC's.  ASSESSMENT AND PLAN

## 2012-04-16 NOTE — Patient Instructions (Addendum)
Your physician recommends that you schedule a follow-up appointment in: 6 WEEKS WITH RR  Your physician recommends that you return for lab work in: 3 WEEKS, LABS SLIPS WILL BE FAXED FOR YOU TO THE [facility]  Your physician has recommended you make the following change in your medication:   1) INCREASE YOUR POTASSIUM TO THREE TIMES DAILY A NEW PRESCRIPTION HAS BEEN CALLED INTO YOUR PHARMACY

## 2012-04-16 NOTE — Assessment & Plan Note (Signed)
Repeat labs in 2 weeks for ongoing assessment.

## 2012-04-16 NOTE — Progress Notes (Deleted)
Name: Tiffany [name]    DOB: 1940/11/02  Age: 71 y.o.  MR#: [id number]       PCP:  Colon Branch, MD      Insurance: @PAYORNAME @   CC:    Chief Complaint  Patient presents with  . Follow-up    Post Hospital (chest and right side pain)    VS BP 130/78  Pulse 82  Ht 5' 10.5" (1.791 m)  Wt 146 lb 0.6 oz (66.243 kg)  BMI 20.66 kg/m2  SpO2 97%  Weights Current Weight  04/16/12 146 lb 0.6 oz (66.243 kg)  11/28/11 144 lb (65.318 kg)  09/06/11 141 lb 8.6 oz (64.2 kg)    Blood Pressure  BP Readings from Last 3 Encounters:  04/16/12 130/78  04/10/12 169/84  11/28/11 140/80     Admit date:  (Not on file) Last encounter with RMR:  11/28/2011   Allergy Allergies  Allergen Reactions  . Florinef (Fludrocortisone Acetate)     Malaise: Discomfort   . Iohexol Other (See Comments)    Pt. States that Dr. Lowell Guitar her kidney Dr. Does not want her to receive iv dye due to increased risk of kidney failure.   . Sulfonamide Derivatives Hives  . Tape Rash    ADHESIVE    Current Outpatient Prescriptions  Medication Sig Dispense Refill  . ALPRAZolam (XANAX) 0.5 MG tablet Take 0.5 mg by mouth 3 (three) times daily.       Marland Kitchen amitriptyline (ELAVIL) 100 MG tablet Take 100 mg by mouth at bedtime.        Marland Kitchen aspirin EC 81 MG tablet Take 81 mg by mouth at bedtime.       Marland Kitchen atorvastatin (LIPITOR) 40 MG tablet Take 40 mg by mouth at bedtime.       . Biotin 5000 MCG CAPS Take 5,000 mcg by mouth every evening.       . Carboxymethylcellul-Glycerin (OPTIVE SENSITIVE OP) Apply 1 drop to eye daily.       . Cholecalciferol (VITAMIN D) 400 UNITS capsule Take 400 Units by mouth every evening.       . clobetasol cream (TEMOVATE) 0.05 % Apply 0.05 application topically 2 (two) times daily.        Marland Kitchen docusate sodium ([employer] STOOL SOFTENER) 100 MG capsule Take 100 mg by mouth at bedtime.      . fludrocortisone (FLORINEF) 0.1 MG tablet Take 1 tablet (0.1 mg total) by mouth 2 (two) times daily.  60 tablet  3  . folic acid  (FOLVITE) 1 MG tablet Take 1 mg by mouth every morning.       Marland Kitchen HYDROcodone-acetaminophen (VICODIN) 5-500 MG per tablet Take 1 tablet by mouth every 6 (six) hours as needed. For pain      . iron polysaccharides (NIFEREX) 150 MG capsule Take 150 mg by mouth daily.      Marland Kitchen levothyroxine (SYNTHROID, LEVOTHROID) 25 MCG tablet Take 25 mcg by mouth every morning.       . nitroGLYCERIN (NITROSTAT) 0.4 MG SL tablet Place 1 tablet (0.4 mg total) under the tongue every 5 (five) minutes as needed.  100 tablet  6  . Omega-3 Fatty Acids (FISH OIL) 1000 MG CAPS Take 1 capsule by mouth at bedtime.       Marland Kitchen omeprazole (PRILOSEC) 20 MG capsule Take 20 mg by mouth 2 (two) times daily.       . pilocarpine (SALAGEN) 5 MG tablet Take 5 mg by mouth 3 (three) times daily.      Marland Kitchen  raloxifene (EVISTA) 60 MG tablet Take 60 mg by mouth daily.        . sodium bicarbonate 325 MG tablet Take 325 mg by mouth 3 (three) times daily.       . Multiple Vitamins-Minerals (PRESERVISION/LUTEIN) CAPS Take 1 capsule by mouth 2 (two) times daily.         Discontinued Meds:   There are no discontinued medications.  Patient Active Problem List  Diagnosis  . LYMPHOMA  . Orthostatic hypotension  . SYNCOPE  . WEIGHT LOSS, ABNORMAL  . Tobacco abuse  . Sjogren's syndrome  . Hypertension  . Hyperlipidemia  . Arteriosclerotic cardiovascular disease (ASCVD)  . Anemia  . Chronic kidney disease  . Chest pain  . Pulmonary embolism  . Chronic anticoagulation    LABS Admission on 04/10/2012, Discharged on 04/10/2012  Component Date Value  . Troponin I 04/10/2012 <0.30   . WBC 04/10/2012 3.5*  . RBC 04/10/2012 3.55*  . Hemoglobin 04/10/2012 10.5*  . HCT 04/10/2012 32.7*  . MCV 04/10/2012 92.1   . [facility] 04/10/2012 29.6   . MCHC 04/10/2012 32.1   . RDW 04/10/2012 15.9*  . Platelets 04/10/2012 149*  . Neutrophils Relative 04/10/2012 53   . Neutro Abs 04/10/2012 1.8   . Lymphocytes Relative 04/10/2012 27   . Lymphs Abs 04/10/2012  1.0   . Monocytes Relative 04/10/2012 13*  . Monocytes Absolute 04/10/2012 0.5   . Eosinophils Relative 04/10/2012 6*  . Eosinophils Absolute 04/10/2012 0.2   . Basophils Relative 04/10/2012 0   . Basophils Absolute 04/10/2012 0.0   . Sodium 04/10/2012 141   . Potassium 04/10/2012 3.3*  . Chloride 04/10/2012 110   . CO2 04/10/2012 23   . Glucose, Bld 04/10/2012 89   . BUN 04/10/2012 20   . Creatinine, Ser 04/10/2012 1.88*  . Calcium 04/10/2012 8.9   . GFR calc non Af Amer 04/10/2012 26*  . GFR calc Af Amer 04/10/2012 30*  . Color, Urine 04/10/2012 YELLOW   . APPearance 04/10/2012 CLEAR   . Specific Gravity, Urine 04/10/2012 1.010   . pH 04/10/2012 6.5   . Glucose, UA 04/10/2012 NEGATIVE   . Hgb urine dipstick 04/10/2012 NEGATIVE   . Bilirubin Urine 04/10/2012 NEGATIVE   . Ketones, ur 04/10/2012 NEGATIVE   . Protein, ur 04/10/2012 30*  . Urobilinogen, UA 04/10/2012 0.2   . Nitrite 04/10/2012 NEGATIVE   . Leukocytes, UA 04/10/2012 NEGATIVE   . Specimen Description 04/10/2012 URINE, CLEAN CATCH   . Special Requests 04/10/2012 A   . Culture  Setup Time 04/10/2012 04/11/2012 03:46   . Colony Count 04/10/2012 NO GROWTH   . Culture 04/10/2012 NO GROWTH   . Report Status 04/10/2012 04/12/2012 FINAL   . Squamous Epithelial / LPF 04/10/2012 RARE   . WBC, UA 04/10/2012 0-2   . Bacteria, UA 04/10/2012 RARE      Results for this Opt Visit:     Results for orders placed during the hospital encounter of 04/10/12  TROPONIN I      Component Value Range   Troponin I <0.30  <0.30 ng/mL  CBC WITH DIFFERENTIAL      Component Value Range   WBC 3.5 (*) 4.0 - 10.5 K/uL   RBC 3.55 (*) 3.87 - 5.11 MIL/uL   Hemoglobin 10.5 (*) 12.0 - 15.0 g/dL   HCT 16.1 (*) 09.6 - 04.5 %   MCV 92.1  78.0 - 100.0 fL   MCH 29.6  26.0 - 34.0 pg   MCHC  32.1  30.0 - 36.0 g/dL   RDW 16.1 (*) 09.6 - 04.5 %   Platelets 149 (*) 150 - 400 K/uL   Neutrophils Relative 53  43 - 77 %   Neutro Abs 1.8  1.7 - 7.7  K/uL   Lymphocytes Relative 27  12 - 46 %   Lymphs Abs 1.0  0.7 - 4.0 K/uL   Monocytes Relative 13 (*) 3 - 12 %   Monocytes Absolute 0.5  0.1 - 1.0 K/uL   Eosinophils Relative 6 (*) 0 - 5 %   Eosinophils Absolute 0.2  0.0 - 0.7 K/uL   Basophils Relative 0  0 - 1 %   Basophils Absolute 0.0  0.0 - 0.1 K/uL  BASIC METABOLIC PANEL      Component Value Range   Sodium 141  135 - 145 mEq/L   Potassium 3.3 (*) 3.5 - 5.1 mEq/L   Chloride 110  96 - 112 mEq/L   CO2 23  19 - 32 mEq/L   Glucose, Bld 89  70 - 99 mg/dL   BUN 20  6 - 23 mg/dL   Creatinine, Ser 4.09 (*) 0.50 - 1.10 mg/dL   Calcium 8.9  8.4 - 81.1 mg/dL   GFR calc non Af Amer 26 (*) >90 mL/min   GFR calc Af Amer 30 (*) >90 mL/min  URINALYSIS, ROUTINE W REFLEX MICROSCOPIC      Component Value Range   Color, Urine YELLOW  YELLOW   APPearance CLEAR  CLEAR   Specific Gravity, Urine 1.010  1.005 - 1.030   pH 6.5  5.0 - 8.0   Glucose, UA NEGATIVE  NEGATIVE mg/dL   Hgb urine dipstick NEGATIVE  NEGATIVE   Bilirubin Urine NEGATIVE  NEGATIVE   Ketones, ur NEGATIVE  NEGATIVE mg/dL   Protein, ur 30 (*) NEGATIVE mg/dL   Urobilinogen, UA 0.2  0.0 - 1.0 mg/dL   Nitrite NEGATIVE  NEGATIVE   Leukocytes, UA NEGATIVE  NEGATIVE  URINE CULTURE      Component Value Range   Specimen Description URINE, CLEAN CATCH     Special Requests A     Culture  Setup Time 04/11/2012 03:46     Colony Count NO GROWTH     Culture NO GROWTH     Report Status 04/12/2012 FINAL    URINE MICROSCOPIC-ADD ON      Component Value Range   Squamous Epithelial / LPF RARE  RARE   WBC, UA 0-2  <3 WBC/hpf   Bacteria, UA RARE  RARE    EKG Orders placed during the hospital encounter of 04/10/12  . ED EKG  . ED EKG  . EKG 12-LEAD  . EKG 12-LEAD  . EKG     Prior Assessment and Plan Problem List as of 04/16/2012          LYMPHOMA   Orthostatic hypotension   Last Assessment & Plan Note   05/24/2011 Office Visit Addendum 05/24/2011  1:00 PM by Kathlen Brunswick, MD     Control of orthostatic hypotension and symptoms is probably about the best we can do without causing unacceptable supine hypertension.  Patient was advised to discontinue ProAmatine, but does not know by whom or why.  She does not appear to need 2 medications at present.  Current treatment with Florinef will be continued.    SYNCOPE   Last Assessment & Plan Note   05/24/2011 Office Visit Signed 05/24/2011  1:01 PM by Kathlen Brunswick, MD  Episodes continue, but at a much reduced frequency.  Patient is aware of techniques to avoid loss of consciousness, but does not practice.  The importance of avoiding trauma was again discussed with her.    WEIGHT LOSS, ABNORMAL   Last Assessment & Plan Note   05/24/2011 Office Visit Signed 05/24/2011  1:01 PM by Kathlen Brunswick, MD    Weight has been stable since her last visit.    Tobacco abuse   Sjogren's syndrome   Hypertension   Last Assessment & Plan Note   05/24/2011 Office Visit Signed 05/24/2011 12:58 PM by Kathlen Brunswick, MD    BP 143/75  Pulse 80  Ht 5\' 10"  (1.778 m)  Wt 62.596 kg (138 lb)  BMI 19.80 kg/m2  Control of hypertension is adequate in view of her significant orthostatic drop in blood pressure and continuing albeit occasional symptoms.  It will be necessary to tolerate a modest elevation in blood pressure to protect her from symptomatic hypotension.  She is advised to sleep with the head of bed elevated.     Hyperlipidemia   Last Assessment & Plan Note   05/24/2011 Office Visit Signed 05/24/2011 12:56 PM by Kathlen Brunswick, MD    Lipid control was excellent when last assessed one month ago.  Current therapy will be continued.    Arteriosclerotic cardiovascular disease (ASCVD)   Last Assessment & Plan Note   11/28/2011 Office Visit Signed 11/28/2011  4:08 PM by Jodelle Gross, NP    Stable at present without recurrent chest pain. She will continue on current medication regimen. Will see her in 6 months unless she is again  symptomatic.    Anemia   Last Assessment & Plan Note   05/24/2011 Office Visit Signed 05/24/2011 12:53 PM by Kathlen Brunswick, MD    Patient has had long-standing anemia, present at this level or worse for at least the past few years.  Iron studies obtained in 2011 were nondiagnostic with serum iron of 67, TIBC of 179, saturation 37% and a ferritin of 56, subsequently measured at 93.  She reports colonoscopy approximately 3-4 years ago with negative findings.  Anemia appears out of portion to her connective tissue problems.  Stool for Hemoccult testing will be obtained.  TSH was normal in 2012 as was cortisol in 2011.  She has never been seen by a hematologist and will address a referral at her next visit with her PCP.    Chronic kidney disease   Last Assessment & Plan Note   05/24/2011 Office Visit Signed 05/24/2011 12:56 PM by Kathlen Brunswick, MD    Hyponatremia may be secondary to use of Florinef, but deterioration in renal function not likely related to the drug.  We will continue to monitor electrolytes and renal function.    Chest pain   Pulmonary embolism   Last Assessment & Plan Note   11/28/2011 Office Visit Signed 11/28/2011  4:09 PM by Jodelle Gross, NP    She is currently on Xarelto but has not started this yet. Will begin today. I will check CBC and BMET for follow-up labs with history of anemia and CKD on this medication. Hemoccult cards will be provided.      Chronic anticoagulation       Imaging: Dg Chest Portable 1 View  04/10/2012  *RADIOLOGY REPORT*  Clinical Data: Chest pain.  Right flank pain.  PORTABLE CHEST - 1 VIEW 04/10/2012 1623 hours:  Comparison: Portable chest x-ray and CT chest  09/06/2011.  Two-view chest x-ray 07/21/2010.  Findings: Cardiac silhouette upper normal in size to slightly enlarged but stable.  Thoracic aorta atherosclerotic, unchanged. Hilar and mediastinal contours otherwise unremarkable. Hyperinflation with emphysematous changes in the upper  lobes, prominent bronchovascular markings diffusely, and biapical pleuroparenchymal scarring, unchanged.  No new pulmonary parenchymal abnormalities.  IMPRESSION: Stable borderline to mild cardiomegaly.  Stable COPD/emphysema.  No acute cardiopulmonary disease.   Original Report Authenticated By: Hulan Saas, M.D.      [facility] Calculation: Score not calculated. Missing: Total Cholesterol

## 2012-04-16 NOTE — Assessment & Plan Note (Signed)
She has no further complaints at this time. I did notice that she is hypokalemic with level of 3.3 that has not been repleted. She was taking potassium 20 mEq  BID but had been on it TID. She will return to TID dose. Follow-labs will be completed in a couple of weeks. She will see [place] again in 2 months and then return to every 6 months if all is well.

## 2012-04-24 ENCOUNTER — Encounter: Payer: Self-pay | Admitting: *Deleted

## 2012-04-24 ENCOUNTER — Other Ambulatory Visit: Payer: Self-pay | Admitting: *Deleted

## 2012-04-24 DIAGNOSIS — R079 Chest pain, unspecified: Secondary | ICD-10-CM

## 2012-04-24 DIAGNOSIS — E876 Hypokalemia: Secondary | ICD-10-CM

## 2012-05-08 ENCOUNTER — Other Ambulatory Visit: Payer: Self-pay | Admitting: Adult Health

## 2012-05-09 LAB — BASIC METABOLIC PANEL
BUN: 23 mg/dL (ref 6–23)
Calcium: 8.8 mg/dL (ref 8.4–10.5)
Chloride: 109 mEq/L (ref 96–112)
Creat: 1.87 mg/dL — ABNORMAL HIGH (ref 0.50–1.10)

## 2012-05-28 ENCOUNTER — Ambulatory Visit: Payer: PRIVATE HEALTH INSURANCE | Admitting: Cardiology

## 2012-06-01 ENCOUNTER — Ambulatory Visit (INDEPENDENT_AMBULATORY_CARE_PROVIDER_SITE_OTHER): Payer: PRIVATE HEALTH INSURANCE | Admitting: Adult Health

## 2012-06-01 ENCOUNTER — Encounter: Payer: Self-pay | Admitting: Adult Health

## 2012-06-01 VITALS — BP 142/80 | HR 82 | Ht 70.5 in | Wt 150.1 lb

## 2012-06-01 DIAGNOSIS — R55 Syncope and collapse: Secondary | ICD-10-CM

## 2012-06-01 DIAGNOSIS — I10 Essential (primary) hypertension: Secondary | ICD-10-CM

## 2012-06-01 DIAGNOSIS — I2510 Atherosclerotic heart disease of native coronary artery without angina pectoris: Secondary | ICD-10-CM

## 2012-06-01 DIAGNOSIS — I7090 Unspecified atherosclerosis: Secondary | ICD-10-CM

## 2012-06-01 NOTE — Assessment & Plan Note (Signed)
No further complaints of syncope, dizziness or near syncope.

## 2012-06-01 NOTE — Assessment & Plan Note (Signed)
Essentially good control of BP at present. Heart rate is irregular. EKG shows frequent PVC's. I have her on potassium replacement with history of hypokalemia. I will check labs today as her PCP adjusted her dose, and she is finding that palpitations are worsening.

## 2012-06-01 NOTE — Progress Notes (Deleted)
Name: Tiffany [name]    DOB: December 04, 1940  Age: 72 y.o.  MR#: [id number]       PCP:  Colon Branch, MD      Insurance: @PAYORNAME @   CC:   No chief complaint on file.   VS BP 142/80  Pulse 82  Ht 5' 10.5" (1.791 m)  Wt 150 lb 1.9 oz (68.094 kg)  BMI 21.24 kg/m2  Weights Current Weight  06/01/12 150 lb 1.9 oz (68.094 kg)  04/16/12 146 lb 0.6 oz (66.243 kg)  11/28/11 144 lb (65.318 kg)    Blood Pressure  BP Readings from Last 3 Encounters:  06/01/12 142/80  04/16/12 130/78  04/10/12 169/84     Admit date:  (Not on file) Last encounter with RMR:  05/08/2012   Allergy Allergies  Allergen Reactions  . Florinef (Fludrocortisone Acetate)     Malaise: Discomfort   . Iohexol Other (See Comments)    Pt. States that Dr. Lowell Guitar her kidney Dr. Does not want her to receive iv dye due to increased risk of kidney failure.   . Sulfonamide Derivatives Hives  . Tape Rash    ADHESIVE    Current Outpatient Prescriptions  Medication Sig Dispense Refill  . ALPRAZolam (XANAX) 0.5 MG tablet Take 0.5 mg by mouth 3 (three) times daily.       Marland Kitchen amitriptyline (ELAVIL) 100 MG tablet Take 100 mg by mouth at bedtime.        Marland Kitchen aspirin EC 81 MG tablet Take 81 mg by mouth at bedtime.       Marland Kitchen atorvastatin (LIPITOR) 40 MG tablet Take 40 mg by mouth at bedtime.       . Biotin 5000 MCG CAPS Take 5,000 mcg by mouth every evening.       . Carboxymethylcellul-Glycerin (OPTIVE SENSITIVE OP) Apply 1 drop to eye 3 (three) times daily.       . Cholecalciferol (VITAMIN D) 400 UNITS capsule Take 400 Units by mouth every evening.       . clobetasol cream (TEMOVATE) 0.05 % Apply 0.05 application topically 2 (two) times daily.        Marland Kitchen docusate sodium ([employer] STOOL SOFTENER) 100 MG capsule Take 100 mg by mouth at bedtime.      . fludrocortisone (FLORINEF) 0.1 MG tablet Take 1 tablet (0.1 mg total) by mouth 2 (two) times daily.  60 tablet  3  . folic acid (FOLVITE) 1 MG tablet Take 1 mg by mouth every morning.       Marland Kitchen  HYDROcodone-acetaminophen (VICODIN) 5-500 MG per tablet Take 1 tablet by mouth every 6 (six) hours as needed. For pain      . iron polysaccharides (NIFEREX) 150 MG capsule Take 150 mg by mouth daily.      Marland Kitchen levothyroxine (SYNTHROID, LEVOTHROID) 25 MCG tablet Take 25 mcg by mouth every morning.       . nitroGLYCERIN (NITROSTAT) 0.4 MG SL tablet Place 1 tablet (0.4 mg total) under the tongue every 5 (five) minutes as needed.  100 tablet  6  . Omega-3 Fatty Acids (FISH OIL) 1000 MG CAPS Take 1 capsule by mouth at bedtime.       Marland Kitchen omeprazole (PRILOSEC) 20 MG capsule Take 20 mg by mouth 2 (two) times daily.       . pilocarpine (SALAGEN) 5 MG tablet Take 5 mg by mouth 3 (three) times daily.      . potassium chloride SA (K-DUR,KLOR-CON) 20 MEQ tablet Take 0.5 tablets (10  mEq total) by mouth 3 (three) times daily.  90 tablet  3  . raloxifene (EVISTA) 60 MG tablet Take 60 mg by mouth daily.        . sodium bicarbonate 325 MG tablet Take 325 mg by mouth 3 (three) times daily.       . Multiple Vitamins-Minerals (PRESERVISION/LUTEIN) CAPS Take 1 capsule by mouth 2 (two) times daily.         Discontinued Meds:   There are no discontinued medications.  Patient Active Problem List  Diagnosis  . LYMPHOMA  . Orthostatic hypotension  . SYNCOPE  . WEIGHT LOSS, ABNORMAL  . Tobacco abuse  . Sjogren's syndrome  . Hypertension  . Hyperlipidemia  . Arteriosclerotic cardiovascular disease (ASCVD)  . Anemia  . Chronic kidney disease  . Chest pain  . Pulmonary embolism  . Chronic anticoagulation    LABS Orders Only on 05/08/2012  Component Date Value  . Sodium 05/08/2012 141   . Potassium 05/08/2012 4.2   . Chloride 05/08/2012 109   . CO2 05/08/2012 23   . Glucose, Bld 05/08/2012 81   . BUN 05/08/2012 23   . Creat 05/08/2012 1.87*  . Calcium 05/08/2012 8.8   Admission on 04/10/2012, Discharged on 04/10/2012  Component Date Value  . Troponin I 04/10/2012 <0.30   . WBC 04/10/2012 3.5*  . RBC  04/10/2012 3.55*  . Hemoglobin 04/10/2012 10.5*  . HCT 04/10/2012 32.7*  . MCV 04/10/2012 92.1   . [facility] 04/10/2012 29.6   . MCHC 04/10/2012 32.1   . RDW 04/10/2012 15.9*  . Platelets 04/10/2012 149*  . Neutrophils Relative 04/10/2012 53   . Neutro Abs 04/10/2012 1.8   . Lymphocytes Relative 04/10/2012 27   . Lymphs Abs 04/10/2012 1.0   . Monocytes Relative 04/10/2012 13*  . Monocytes Absolute 04/10/2012 0.5   . Eosinophils Relative 04/10/2012 6*  . Eosinophils Absolute 04/10/2012 0.2   . Basophils Relative 04/10/2012 0   . Basophils Absolute 04/10/2012 0.0   . Sodium 04/10/2012 141   . Potassium 04/10/2012 3.3*  . Chloride 04/10/2012 110   . CO2 04/10/2012 23   . Glucose, Bld 04/10/2012 89   . BUN 04/10/2012 20   . Creatinine, Ser 04/10/2012 1.88*  . Calcium 04/10/2012 8.9   . GFR calc non Af Amer 04/10/2012 26*  . GFR calc Af Amer 04/10/2012 30*  . Color, Urine 04/10/2012 YELLOW   . APPearance 04/10/2012 CLEAR   . Specific Gravity, Urine 04/10/2012 1.010   . pH 04/10/2012 6.5   . Glucose, UA 04/10/2012 NEGATIVE   . Hgb urine dipstick 04/10/2012 NEGATIVE   . Bilirubin Urine 04/10/2012 NEGATIVE   . Ketones, ur 04/10/2012 NEGATIVE   . Protein, ur 04/10/2012 30*  . Urobilinogen, UA 04/10/2012 0.2   . Nitrite 04/10/2012 NEGATIVE   . Leukocytes, UA 04/10/2012 NEGATIVE   . Specimen Description 04/10/2012 URINE, CLEAN CATCH   . Special Requests 04/10/2012 A   . Culture  Setup Time 04/10/2012 04/11/2012 03:46   . Colony Count 04/10/2012 NO GROWTH   . Culture 04/10/2012 NO GROWTH   . Report Status 04/10/2012 04/12/2012 FINAL   . Squamous Epithelial / LPF 04/10/2012 RARE   . WBC, UA 04/10/2012 0-2   . Bacteria, UA 04/10/2012 RARE      Results for this Opt Visit:     Results for orders placed in visit on 05/08/12  BASIC METABOLIC PANEL      Component Value Range   Sodium 141  135 -  145 mEq/L   Potassium 4.2  3.5 - 5.3 mEq/L   Chloride 109  96 - 112 mEq/L   CO2 23  19 -  32 mEq/L   Glucose, Bld 81  70 - 99 mg/dL   BUN 23  6 - 23 mg/dL   Creat 1.61 (*) 0.96 - 1.10 mg/dL   Calcium 8.8  8.4 - 04.5 mg/dL    EKG Orders placed in visit on 04/16/12  . EKG 12-LEAD     Prior Assessment and Plan Problem List as of 06/01/2012          LYMPHOMA   Orthostatic hypotension   Last Assessment & Plan Note   05/24/2011 Office Visit Addendum 05/24/2011  1:00 PM by Kathlen Brunswick, MD    Control of orthostatic hypotension and symptoms is probably about the best we can do without causing unacceptable supine hypertension.  Patient was advised to discontinue ProAmatine, but does not know by whom or why.  She does not appear to need 2 medications at present.  Current treatment with Florinef will be continued.    SYNCOPE   Last Assessment & Plan Note   05/24/2011 Office Visit Signed 05/24/2011  1:01 PM by Kathlen Brunswick, MD    Episodes continue, but at a much reduced frequency.  Patient is aware of techniques to avoid loss of consciousness, but does not practice.  The importance of avoiding trauma was again discussed with her.    WEIGHT LOSS, ABNORMAL   Last Assessment & Plan Note   05/24/2011 Office Visit Signed 05/24/2011  1:01 PM by Kathlen Brunswick, MD    Weight has been stable since her last visit.    Tobacco abuse   Sjogren's syndrome   Hypertension   Last Assessment & Plan Note   04/16/2012 Office Visit Signed 04/16/2012  4:33 PM by Jodelle Gross, NP    Excellent control of BP. No changes in medications at this time.    Hyperlipidemia   Last Assessment & Plan Note   05/24/2011 Office Visit Signed 05/24/2011 12:56 PM by Kathlen Brunswick, MD    Lipid control was excellent when last assessed one month ago.  Current therapy will be continued.    Arteriosclerotic cardiovascular disease (ASCVD)   Last Assessment & Plan Note   11/28/2011 Office Visit Signed 11/28/2011  4:08 PM by Jodelle Gross, NP    Stable at present without recurrent chest pain. She will  continue on current medication regimen. Will see her in 6 months unless she is again symptomatic.    Anemia   Last Assessment & Plan Note   05/24/2011 Office Visit Signed 05/24/2011 12:53 PM by Kathlen Brunswick, MD    Patient has had long-standing anemia, present at this level or worse for at least the past few years.  Iron studies obtained in 2011 were nondiagnostic with serum iron of 67, TIBC of 179, saturation 37% and a ferritin of 56, subsequently measured at 93.  She reports colonoscopy approximately 3-4 years ago with negative findings.  Anemia appears out of portion to her connective tissue problems.  Stool for Hemoccult testing will be obtained.  TSH was normal in 2012 as was cortisol in 2011.  She has never been seen by a hematologist and will address a referral at her next visit with her PCP.    Chronic kidney disease   Last Assessment & Plan Note   04/16/2012 Office Visit Signed 04/16/2012  4:33 PM by Jodelle Gross,  NP    Repeat labs in 2 weeks for ongoing assessment.    Chest pain   Last Assessment & Plan Note   04/16/2012 Office Visit Signed 04/16/2012  4:32 PM by Jodelle Gross, NP    She has no further complaints at this time. I did notice that she is hypokalemic with level of 3.3 that has not been repleted. She was taking potassium 20 mEq  BID but had been on it TID. She will return to TID dose. Follow-labs will be completed in a couple of weeks. She will see [place] again in 2 months and then return to every 6 months if all is well.    Pulmonary embolism   Last Assessment & Plan Note   11/28/2011 Office Visit Signed 11/28/2011  4:09 PM by Jodelle Gross, NP    She is currently on Xarelto but has not started this yet. Will begin today. I will check CBC and BMET for follow-up labs with history of anemia and CKD on this medication. Hemoccult cards will be provided.      Chronic anticoagulation       Imaging: No results found.   FRS Calculation: Score not calculated.  Missing: Total Cholesterol

## 2012-06-01 NOTE — Assessment & Plan Note (Signed)
Other than frequent palpitations, she is without complaint. No chest pain or significiant DOE.

## 2012-06-01 NOTE — Progress Notes (Signed)
HPI: Tiffany [name] is a pleasant 72 y/o patient of Dr.Rothbart we are seeing for ongoing assessment and treatment of CAD, frequent non-life threatening ventricular arrhythmias,  hypercholesterolemia, with history of CKD, and anemia. She comes today without cardiac complaints. Complains of back pain and weakness in her right leg with some radiculopathy from her back down her right leg. She has not discussed this with her PCP. Otherwise, she is medically compliant and having no other symptoms.  Allergies  Allergen Reactions  . Florinef (Fludrocortisone Acetate)     Malaise: Discomfort   . Iohexol Other (See Comments)    Pt. States that Dr. Lowell Guitar her kidney Dr. Does not want her to receive iv dye due to increased risk of kidney failure.   . Sulfonamide Derivatives Hives  . Tape Rash    ADHESIVE    Current Outpatient Prescriptions  Medication Sig Dispense Refill  . ALPRAZolam (XANAX) 0.5 MG tablet Take 0.5 mg by mouth 3 (three) times daily.       Marland Kitchen amitriptyline (ELAVIL) 100 MG tablet Take 100 mg by mouth at bedtime.        Marland Kitchen aspirin EC 81 MG tablet Take 81 mg by mouth at bedtime.       Marland Kitchen atorvastatin (LIPITOR) 40 MG tablet Take 40 mg by mouth at bedtime.       . Biotin 5000 MCG CAPS Take 5,000 mcg by mouth every evening.       . Carboxymethylcellul-Glycerin (OPTIVE SENSITIVE OP) Apply 1 drop to eye 3 (three) times daily.       . Cholecalciferol (VITAMIN D) 400 UNITS capsule Take 400 Units by mouth every evening.       . clobetasol cream (TEMOVATE) 0.05 % Apply 0.05 application topically 2 (two) times daily.        Marland Kitchen docusate sodium ([employer] STOOL SOFTENER) 100 MG capsule Take 100 mg by mouth at bedtime.      . fludrocortisone (FLORINEF) 0.1 MG tablet Take 1 tablet (0.1 mg total) by mouth 2 (two) times daily.  60 tablet  3  . folic acid (FOLVITE) 1 MG tablet Take 1 mg by mouth every morning.       Marland Kitchen HYDROcodone-acetaminophen (VICODIN) 5-500 MG per tablet Take 1 tablet by mouth every 6 (six)  hours as needed. For pain      . iron polysaccharides (NIFEREX) 150 MG capsule Take 150 mg by mouth daily.      Marland Kitchen levothyroxine (SYNTHROID, LEVOTHROID) 25 MCG tablet Take 25 mcg by mouth every morning.       . nitroGLYCERIN (NITROSTAT) 0.4 MG SL tablet Place 1 tablet (0.4 mg total) under the tongue every 5 (five) minutes as needed.  100 tablet  6  . Omega-3 Fatty Acids (FISH OIL) 1000 MG CAPS Take 1 capsule by mouth at bedtime.       Marland Kitchen omeprazole (PRILOSEC) 20 MG capsule Take 20 mg by mouth 2 (two) times daily.       . pilocarpine (SALAGEN) 5 MG tablet Take 5 mg by mouth 3 (three) times daily.      . potassium chloride SA (K-DUR,KLOR-CON) 20 MEQ tablet Take 0.5 tablets (10 mEq total) by mouth 3 (three) times daily.  90 tablet  3  . raloxifene (EVISTA) 60 MG tablet Take 60 mg by mouth daily.        . sodium bicarbonate 325 MG tablet Take 325 mg by mouth 3 (three) times daily.       . Multiple  Vitamins-Minerals (PRESERVISION/LUTEIN) CAPS Take 1 capsule by mouth 2 (two) times daily.         Past Medical History  Diagnosis Date  . Lymphoma   . Systemic lupus erythematosus   . Depression   . Hyperlipidemia   . 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 Sjogren's syndrome   . Orthostatic hypotension   . Dysphagia   . Syncope   . Tobacco abuse   . Weight loss   . Anemia   . Hypertension     hypotension  . Chronic anticoagulation 01/29/2012    Past Surgical History  Procedure Date  . Breast excisional biopsy     Left  . Eye surgery   . Mass excision     Lymphoma  . Coronary angioplasty with stent placement   . Cholecystectomy     ZOX:WRUEAV of systems complete and found to be negative unless listed above  PHYSICAL EXAM BP 142/80  Pulse 82  Ht 5' 10.5" (1.791 m)  Wt 150 lb 1.9 oz (68.094 kg)  BMI 21.24 kg/m2  General: Well developed, well nourished, in no acute distress Head: Eyes PERRLA, No  xanthomas.   Normal cephalic and atramatic  Lungs: Clear bilaterally to auscultation and percussion. Heart: HRIR S1 S2, without MRG.  Pulses are 2+ & equal.            No carotid bruit. No JVD.  No abdominal bruits. No femoral bruits. Abdomen: Bowel sounds are positive, abdomen soft and non-tender without masses or                  Hernia's noted. Msk:  Back normal, normal gait. Normal strength and tone for age. Extremities: No clubbing, cyanosis or edema.  DP +1 Neuro: Alert and oriented X 3. Psych:  Good affect, responds appropriately  WUJ:WJXBJ rhythm with frequent PVC's with Q-wave noted in lead V3.  ASSESSMENT AND PLAN

## 2012-06-01 NOTE — Patient Instructions (Addendum)
Lab work [employer]) today   Continue current medications    Your physician wants you to follow-up in: 6 months You will receive a reminder letter in the mail two months in advance. If you don't receive a letter, please call our office to schedule the follow-up appointment.

## 2012-06-02 ENCOUNTER — Other Ambulatory Visit: Payer: Self-pay | Admitting: *Deleted

## 2012-06-02 DIAGNOSIS — I10 Essential (primary) hypertension: Secondary | ICD-10-CM

## 2012-06-02 LAB — BASIC METABOLIC PANEL
BUN: 25 mg/dL — ABNORMAL HIGH (ref 6–23)
Chloride: 111 mEq/L (ref 96–112)
Creat: 1.72 mg/dL — ABNORMAL HIGH (ref 0.50–1.10)
Potassium: 4.3 mEq/L (ref 3.5–5.3)

## 2012-06-13 ENCOUNTER — Emergency Department (HOSPITAL_COMMUNITY): Payer: PRIVATE HEALTH INSURANCE

## 2012-06-13 ENCOUNTER — Encounter (HOSPITAL_COMMUNITY): Payer: Self-pay | Admitting: *Deleted

## 2012-06-13 ENCOUNTER — Emergency Department (HOSPITAL_COMMUNITY)
Admission: EM | Admit: 2012-06-13 | Discharge: 2012-06-13 | Disposition: A | Discharge status: Home / Self Care | Payer: PRIVATE HEALTH INSURANCE | Attending: Emergency Medicine | Admitting: Emergency Medicine

## 2012-06-13 DIAGNOSIS — E785 Hyperlipidemia, unspecified: Secondary | ICD-10-CM | POA: Insufficient documentation

## 2012-06-13 DIAGNOSIS — Z79899 Other long term (current) drug therapy: Secondary | ICD-10-CM | POA: Insufficient documentation

## 2012-06-13 DIAGNOSIS — Z8669 Personal history of other diseases of the nervous system and sense organs: Secondary | ICD-10-CM | POA: Insufficient documentation

## 2012-06-13 DIAGNOSIS — Z8739 Personal history of other diseases of the musculoskeletal system and connective tissue: Secondary | ICD-10-CM | POA: Insufficient documentation

## 2012-06-13 DIAGNOSIS — R300 Dysuria: Secondary | ICD-10-CM | POA: Insufficient documentation

## 2012-06-13 DIAGNOSIS — D649 Anemia, unspecified: Secondary | ICD-10-CM | POA: Insufficient documentation

## 2012-06-13 DIAGNOSIS — Z7982 Long term (current) use of aspirin: Secondary | ICD-10-CM | POA: Insufficient documentation

## 2012-06-13 DIAGNOSIS — I10 Essential (primary) hypertension: Secondary | ICD-10-CM | POA: Insufficient documentation

## 2012-06-13 DIAGNOSIS — F17200 Nicotine dependence, unspecified, uncomplicated: Secondary | ICD-10-CM | POA: Insufficient documentation

## 2012-06-13 DIAGNOSIS — F3289 Other specified depressive episodes: Secondary | ICD-10-CM | POA: Insufficient documentation

## 2012-06-13 DIAGNOSIS — F329 Major depressive disorder, single episode, unspecified: Secondary | ICD-10-CM | POA: Insufficient documentation

## 2012-06-13 DIAGNOSIS — Z8679 Personal history of other diseases of the circulatory system: Secondary | ICD-10-CM | POA: Insufficient documentation

## 2012-06-13 DIAGNOSIS — N390 Urinary tract infection, site not specified: Secondary | ICD-10-CM | POA: Insufficient documentation

## 2012-06-13 DIAGNOSIS — Z8719 Personal history of other diseases of the digestive system: Secondary | ICD-10-CM | POA: Insufficient documentation

## 2012-06-13 LAB — URINALYSIS, ROUTINE W REFLEX MICROSCOPIC
Bilirubin Urine: NEGATIVE
Glucose, UA: NEGATIVE mg/dL
Hgb urine dipstick: NEGATIVE
Ketones, ur: NEGATIVE mg/dL
pH: 7 (ref 5.0–8.0)

## 2012-06-13 LAB — CBC WITH DIFFERENTIAL/PLATELET
Basophils Relative: 0 % (ref 0–1)
Hemoglobin: 10.4 g/dL — ABNORMAL LOW (ref 12.0–15.0)
Lymphs Abs: 0.8 10*3/uL (ref 0.7–4.0)
MCHC: 31.7 g/dL (ref 30.0–36.0)
Monocytes Relative: 11 % (ref 3–12)
Neutro Abs: 2.4 10*3/uL (ref 1.7–7.7)
Neutrophils Relative %: 63 % (ref 43–77)
RBC: 3.51 MIL/uL — ABNORMAL LOW (ref 3.87–5.11)

## 2012-06-13 LAB — URINE MICROSCOPIC-ADD ON

## 2012-06-13 LAB — COMPREHENSIVE METABOLIC PANEL
ALT: 9 U/L (ref 0–35)
AST: 17 U/L (ref 0–37)
Albumin: 3.3 g/dL — ABNORMAL LOW (ref 3.5–5.2)
Alkaline Phosphatase: 99 U/L (ref 39–117)
BUN: 24 mg/dL — ABNORMAL HIGH (ref 6–23)
Chloride: 104 mEq/L (ref 96–112)
Potassium: 3.6 mEq/L (ref 3.5–5.1)
Total Bilirubin: 0.3 mg/dL (ref 0.3–1.2)

## 2012-06-13 LAB — LIPASE, BLOOD: Lipase: 28 U/L (ref 11–59)

## 2012-06-13 MED ORDER — HYDROMORPHONE HCL PF 1 MG/ML IJ SOLN
0.5000 mg | Freq: Once | INTRAMUSCULAR | Status: AC
  Administered 2012-06-13: 0.5 mg via INTRAVENOUS
  Filled 2012-06-13: qty 1

## 2012-06-13 MED ORDER — SODIUM CHLORIDE 0.9 % IV SOLN
1000.0000 mL | Freq: Once | INTRAVENOUS | Status: AC
  Administered 2012-06-13: 1000 mL via INTRAVENOUS

## 2012-06-13 MED ORDER — ONDANSETRON HCL 4 MG/2ML IJ SOLN
4.0000 mg | Freq: Once | INTRAMUSCULAR | Status: AC
  Administered 2012-06-13: 4 mg via INTRAVENOUS
  Filled 2012-06-13: qty 2

## 2012-06-13 MED ORDER — CEPHALEXIN 500 MG PO CAPS
500.0000 mg | ORAL_CAPSULE | Freq: Once | ORAL | Status: AC
  Administered 2012-06-13: 500 mg via ORAL
  Filled 2012-06-13: qty 1

## 2012-06-13 MED ORDER — CEPHALEXIN 500 MG PO CAPS: 500.0000 mg | ORAL_CAPSULE | Freq: Two times a day (BID) | ORAL | Status: DC

## 2012-06-13 MED ORDER — HYDROCODONE-ACETAMINOPHEN 5-325 MG PO TABS: 1.0000 | ORAL_TABLET | Freq: Three times a day (TID) | ORAL | Status: DC | PRN

## 2012-06-13 NOTE — ED Notes (Signed)
Pt states lower abdominal pressure. States when she stands up, "feels like everything is going to fall out" Symptoms began yesterday.

## 2012-06-13 NOTE — ED Notes (Signed)
IV d/c'd to left AC, catheter intact

## 2012-06-13 NOTE — ED Provider Notes (Signed)
History     CSN: [id number]  Arrival date & time 06/13/12  1750   First MD Initiated Contact with Patient 06/13/12 1822      Chief Complaint  Patient presents with  . Abdominal Pain    (Consider location/radiation/quality/duration/timing/severity/associated sxs/prior treatment) HPI  The patient presents with abdominal pressure, dysuria.  Symptoms began yesterday, without clear precipitant.  Since onset she states that there is a lower abdominal pressure sensation as though everything is going to fall out.  She does not state that she feels as though her genitals or rectum are falling out. She denies fever, chills, nausea, vomiting, diarrhea. No clear alleviating or exacerbating factors. The patient had a colonoscopy one week ago, notable for discovery of likely malignant polyp, which was removed.  She denies other malignancy.  Past Medical History  Diagnosis Date  . Lymphoma   . Systemic lupus erythematosus   . Depression   . Hyperlipidemia   . 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 Sjogren's syndrome   . Orthostatic hypotension   . Dysphagia   . Syncope   . Tobacco abuse   . Weight loss   . Anemia   . Hypertension     hypotension  . Chronic anticoagulation 01/29/2012    Past Surgical History  Procedure Date  . Breast excisional biopsy     Left  . Eye surgery   . Mass excision     Lymphoma  . Coronary angioplasty with stent placement   . Cholecystectomy     Family History  Problem Relation Age of Onset  . Cancer Other   . Coronary artery disease      History  Substance Use Topics  . Smoking status: Current Every Day Smoker -- 0.5 packs/day for 23 years    Types: Cigarettes  . Smokeless tobacco: Not on file  . Alcohol Use: No    OB History    Grav Para Term Preterm Abortions TAB SAB Ect Mult Living                  Review of Systems  Constitutional:       Per  HPI, otherwise negative  HENT:       Per HPI, otherwise negative  Eyes: Negative.   Respiratory:       Per HPI, otherwise negative  Cardiovascular:       Per HPI, otherwise negative  Gastrointestinal: Negative for vomiting.  Genitourinary: Positive for dysuria.  Musculoskeletal:       Per HPI, otherwise negative  Skin: Negative.   Neurological: Negative for syncope.    Allergies  Florinef; Iohexol; Sulfonamide derivatives; and Tape  Home Medications   Current Outpatient Rx  Name  Route  Sig  Dispense  Refill  . ALPRAZOLAM 0.5 MG PO TABS   Oral   Take 0.5 mg by mouth 3 (three) times daily.          Marland Kitchen AMITRIPTYLINE HCL 100 MG PO TABS   Oral   Take 100 mg by mouth at bedtime.           . ASPIRIN EC 81 MG PO TBEC   Oral   Take 81 mg by mouth at bedtime.          . ATORVASTATIN CALCIUM 40 MG PO TABS   Oral   Take 40 mg by mouth at bedtime.          Marland Kitchen  BIOTIN 5000 MCG PO CAPS   Oral   Take 5,000 mcg by mouth every evening.          Geoffry [place] SENSITIVE OP   Ophthalmic   Apply 1 drop to eye 3 (three) times daily.          Marland Kitchen VITAMIN D 400 UNITS PO CAPS   Oral   Take 400 Units by mouth every evening.          Marland Kitchen CLOBETASOL PROPIONATE 0.05 % EX CREA   Topical   Apply 0.05 application topically 2 (two) times daily.           Marland Kitchen DOCUSATE SODIUM 100 MG PO CAPS   Oral   Take 100 mg by mouth at bedtime.         Marland Kitchen FLUDROCORTISONE ACETATE 0.1 MG PO TABS   Oral   Take 1 tablet (0.1 mg total) by mouth 2 (two) times daily.   60 tablet   3   . FOLIC ACID 1 MG PO TABS   Oral   Take 1 mg by mouth every morning.          Marland Kitchen HYDROCODONE-ACETAMINOPHEN 5-325 MG PO TABS   Oral   Take 1 tablet by mouth 4 (four) times daily.         Marland Kitchen POLYSACCHARIDE IRON COMPLEX 150 MG PO CAPS   Oral   Take 150 mg by mouth daily.         Marland Kitchen LEVOTHYROXINE SODIUM 25 MCG PO TABS   Oral   Take 25 mcg by mouth every morning.          Marland Kitchen PRESERVISION/LUTEIN PO CAPS    Oral   Take 1 capsule by mouth 2 (two) times daily.          Marland Kitchen FISH OIL 1000 MG PO CAPS   Oral   Take 1 capsule by mouth at bedtime.          . OMEPRAZOLE 20 MG PO CPDR   Oral   Take 20 mg by mouth 2 (two) times daily.          Marland Kitchen PILOCARPINE HCL 5 MG PO TABS   Oral   Take 5 mg by mouth 3 (three) times daily.         Marland Kitchen POTASSIUM CHLORIDE CRYS ER 20 MEQ PO TBCR   Oral   Take 20 mEq by mouth 3 (three) times daily.         Marland Kitchen RALOXIFENE HCL 60 MG PO TABS   Oral   Take 60 mg by mouth daily.           . SODIUM BICARBONATE 325 MG PO TABS   Oral   Take 325 mg by mouth 3 (three) times daily.          Marland Kitchen NITROGLYCERIN 0.4 MG SL SUBL   Sublingual   Place 1 tablet (0.4 mg total) under the tongue every 5 (five) minutes as needed.   100 tablet   6     BP 140/78  Pulse 81  Temp 98.4 F (36.9 C) (Oral)  Resp 16  Ht 5\' 10"  (1.778 m)  Wt 150 lb (68.04 kg)  BMI 21.52 kg/m2  SpO2 100%  Physical Exam  Nursing note and vitals reviewed. Constitutional: She is oriented to person, place, and time. She appears well-developed and well-nourished. No distress.  HENT:  Head: Normocephalic and atraumatic.  Eyes: Conjunctivae normal and EOM are normal.  Cardiovascular: Normal rate  and regular rhythm.   Pulmonary/Chest: Effort normal and breath sounds normal. No stridor. No respiratory distress.  Abdominal: She exhibits no distension. There is generalized tenderness. There is guarding. There is no rigidity and no rebound.  Musculoskeletal: She exhibits no edema.  Neurological: She is alert and oriented to person, place, and time. No cranial nerve deficit.  Skin: Skin is warm and dry.  Psychiatric: She has a normal mood and affect.    ED Course  Procedures (including critical care time)   Labs Reviewed  CBC WITH DIFFERENTIAL  COMPREHENSIVE METABOLIC PANEL  LIPASE, BLOOD  URINALYSIS, ROUTINE W REFLEX MICROSCOPIC   No results found.   No diagnosis found.  8:56  PM On re-exam the patient appears comfortable.  No new complaints. We discussed all lab and x-ray findings thus far. MDM  This elderly female presents with new dysuria, lower abdominal pressure sensation.  On exam the patient is medically stable, and in no distress.  There is mild suprapubic pain, but no peritonitis.  The patient's labs demonstrate a likely urinary tract infection.  We discussed all results, the likely etiology of her pain, the need for ongoing primary care followup, and she was discharged in stable condition.        Gerhard Munch, MD 06/13/12 [phone]

## 2012-06-15 LAB — URINE CULTURE: Colony Count: NO GROWTH

## 2012-06-24 ENCOUNTER — Other Ambulatory Visit: Payer: Self-pay | Admitting: Adult Health

## 2012-12-16 ENCOUNTER — Other Ambulatory Visit: Payer: Self-pay

## 2013-02-03 ENCOUNTER — Other Ambulatory Visit: Payer: Self-pay | Admitting: Rheumatology

## 2013-02-03 DIAGNOSIS — K1120 Sialoadenitis, unspecified: Secondary | ICD-10-CM

## 2013-02-08 ENCOUNTER — Other Ambulatory Visit (HOSPITAL_COMMUNITY): Payer: Self-pay | Admitting: Rheumatology

## 2013-02-08 DIAGNOSIS — K1120 Sialoadenitis, unspecified: Secondary | ICD-10-CM

## 2013-02-10 ENCOUNTER — Ambulatory Visit (HOSPITAL_COMMUNITY)
Admission: RE | Admit: 2013-02-10 | Discharge: 2013-02-10 | Disposition: A | Discharge status: Home / Self Care | Payer: PRIVATE HEALTH INSURANCE | Source: Ambulatory Visit | Attending: Rheumatology | Admitting: Rheumatology

## 2013-02-10 DIAGNOSIS — R220 Localized swelling, mass and lump, head: Secondary | ICD-10-CM | POA: Insufficient documentation

## 2013-02-10 DIAGNOSIS — K1120 Sialoadenitis, unspecified: Secondary | ICD-10-CM

## 2013-03-18 ENCOUNTER — Other Ambulatory Visit: Payer: Self-pay

## 2013-04-06 ENCOUNTER — Ambulatory Visit (INDEPENDENT_AMBULATORY_CARE_PROVIDER_SITE_OTHER): Payer: PRIVATE HEALTH INSURANCE | Admitting: Adult Health

## 2013-04-06 ENCOUNTER — Other Ambulatory Visit (HOSPITAL_COMMUNITY)
Admission: RE | Admit: 2013-04-06 | Discharge: 2013-04-06 | Disposition: A | Discharge status: Home / Self Care | Payer: PRIVATE HEALTH INSURANCE | Source: Ambulatory Visit | Attending: Adult Health | Admitting: Adult Health

## 2013-04-06 ENCOUNTER — Encounter: Payer: Self-pay | Admitting: Adult Health

## 2013-04-06 VITALS — BP 160/84 | HR 76 | Ht 69.0 in | Wt 149.5 lb

## 2013-04-06 DIAGNOSIS — Z124 Encounter for screening for malignant neoplasm of cervix: Secondary | ICD-10-CM | POA: Insufficient documentation

## 2013-04-06 DIAGNOSIS — Z0000 Encounter for general adult medical examination without abnormal findings: Secondary | ICD-10-CM

## 2013-04-06 DIAGNOSIS — Z01419 Encounter for gynecological examination (general) (routine) without abnormal findings: Secondary | ICD-10-CM

## 2013-04-06 DIAGNOSIS — Z1212 Encounter for screening for malignant neoplasm of rectum: Secondary | ICD-10-CM

## 2013-04-06 DIAGNOSIS — Z1151 Encounter for screening for human papillomavirus (HPV): Secondary | ICD-10-CM | POA: Insufficient documentation

## 2013-04-06 LAB — HEMOCCULT GUIAC POC 1CARD (OFFICE)

## 2013-04-06 NOTE — Patient Instructions (Signed)
Physical in 1 year Mammogram yearly colonoscopy as per GI Labs per PCP 

## 2013-04-06 NOTE — Progress Notes (Signed)
Patient ID: Tiffany [name], female   DOB: 1941-01-14, 72 y.o.   MRN: [id number] History of Present Illness: Tiffany [name] is a 72 year old white female,divorcd, in for a pap and physical. She had a colonoscopy 2 years ago and had 1 polyp with ?cancer, will need follow up in 5 years.She got the flu shot this year and had pneumonia shot in past.  Current Medications, Allergies, Past Medical History, Past Surgical History, Family History and Social History were reviewed in [employer] record.   Past Medical History  Diagnosis Date  . Lymphoma   . Systemic lupus erythematosus   . Depression   . Hyperlipidemia   . 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 Sjogren's syndrome   . Orthostatic hypotension   . Dysphagia   . Syncope   . Tobacco abuse   . Weight loss   . Anemia   . Hypertension     hypotension  . Chronic anticoagulation 01/29/2012  . Chronic kidney disease   . 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 Thyroid disease   . Coronary artery disease     MI   Past Surgical History  Procedure Laterality Date  . Breast excisional biopsy      Left  . Eye surgery    . Mass excision      Lymphoma  . Coronary angioplasty with stent placement    . Cholecystectomy    . Cataract extraction Bilateral   Current outpatient prescriptions:ALPRAZolam (XANAX) 0.5 MG tablet, Take 0.5 mg by mouth 3 (three) times daily. , Disp: , Rfl: ;  amitriptyline (ELAVIL) 100 MG tablet, Take 100 mg by mouth at bedtime.  , Disp: , Rfl: ;  aspirin EC 81 MG tablet, Take 81 mg by mouth at bedtime. , Disp: , Rfl: ;  atorvastatin (LIPITOR) 40 MG tablet, Take 40 mg by mouth at bedtime. , Disp: , Rfl:  Biotin 5000 MCG CAPS, Take 5,000 mcg by mouth every evening. , Disp: , Rfl: ;  Carboxymethylcellul-Glycerin (OPTIVE SENSITIVE OP), Apply 1 drop to eye 3 (three) times daily. , Disp: , Rfl: ;  Cholecalciferol (VITAMIN D) 400  UNITS capsule, Take 400 Units by mouth every evening. , Disp: , Rfl: ;  clobetasol cream (TEMOVATE) 0.05 %, Apply 0.05 application topically 2 (two) times daily.  , Disp: , Rfl:  docusate sodium ([employer]S STOOL SOFTENER) 100 MG capsule, Take 100 mg by mouth at bedtime., Disp: , Rfl: ;  folic acid (FOLVITE) 1 MG tablet, Take 1 mg by mouth every morning. , Disp: , Rfl: ;  HYDROcodone-acetaminophen (NORCO/VICODIN) 5-325 MG per tablet, Take 1 tablet by mouth every 8 (eight) hours as needed for pain., Disp: 10 tablet, Rfl: 0;  iron polysaccharides (NIFEREX) 150 MG capsule, Take 150 mg by mouth daily., Disp: , Rfl:  levothyroxine (SYNTHROID, LEVOTHROID) 25 MCG tablet, Take 25 mcg by mouth every morning. , Disp: , Rfl: ;  Multiple Vitamins-Minerals (PRESERVISION/LUTEIN) CAPS, Take 1 capsule by mouth 2 (two) times daily. , Disp: , Rfl: ;  nitroGLYCERIN (NITROSTAT) 0.4 MG SL tablet, Place 1 tablet (0.4 mg total) under the tongue every 5 (five) minutes as needed., Disp: 100 tablet, Rfl: 6 Omega-3 Fatty Acids (FISH OIL) 1000 MG CAPS, Take 1 capsule by mouth at bedtime. , Disp: , Rfl: ;  omeprazole (PRILOSEC) 20 MG capsule, Take 20 mg  by mouth 2 (two) times daily. , Disp: , Rfl: ;  potassium chloride SA (K-DUR,KLOR-CON) 20 MEQ tablet, Take 20 mEq by mouth 3 (three) times daily., Disp: , Rfl: ;  raloxifene (EVISTA) 60 MG tablet, Take 60 mg by mouth daily.  , Disp: , Rfl:  sodium bicarbonate 325 MG tablet, Take 325 mg by mouth 3 (three) times daily. , Disp: , Rfl: ;  cephALEXin (KEFLEX) 500 MG capsule, Take 1 capsule (500 mg total) by mouth 2 (two) times daily., Disp: 10 capsule, Rfl: 0;  fludrocortisone (FLORINEF) 0.1 MG tablet, Take 1 tablet (0.1 mg total) by mouth 2 (two) times daily., Disp: 60 tablet, Rfl: 3 HYDROcodone-acetaminophen (NORCO/VICODIN) 5-325 MG per tablet, Take 1 tablet by mouth 4 (four) times daily., Disp: , Rfl: ;  hydroxychloroquine (PLAQUENIL) 200 MG tablet, , Disp: , Rfl: ;  KLOR-CON 10 10 MEQ tablet, ,  Disp: , Rfl: ;  pilocarpine (SALAGEN) 5 MG tablet, Take 5 mg by mouth 3 (three) times daily., Disp: , Rfl:   Review of Systems: Patient denies any headaches, blurred vision, shortness of breath, chest pain, abdominal pain, problems with urination, she has constipation and takes meds every 3 days.She is walking with a cane now.No mood changes.   Physical Exam:BP 160/84  Pulse 76  Ht 5\' 9"  (1.753 m)  Wt 149 lb 8 oz (67.813 kg)  BMI 22.07 kg/m2 General:  Well developed, well nourished, no acute distress Skin:  Warm and dry Neck:  Midline trachea, normal thyroid, no carotid bruits heard, has scar left neck and radiation tattoos Lungs; Clear to auscultation bilaterally Breast:  No dominant palpable mass, retraction, or nipple discharge Cardiovascular: Regular rate and rhythm Abdomen:  Soft, non tender, no hepatosplenomegaly Pelvic:  External genitalia is normal in appearance for age.  The vagina is normal in appearance for age, with atrophy.The cervix is atrophic and stenotic, pap performed with HPV.  Uterus is felt to be normal size, shape, and contour.  No  adnexal masses or tenderness noted. Rectal: Good sphincter tone, no polyps, or hemorrhoids felt.  Hemoccult negative, mild rectocele noted.Extremities:  No swelling noted Psych:  No mood changes, alert and cooperative,seems happy  Impression: Normal gyn exam for age  Plan: Physical in 2 years Mammogram yearly Colonoscopy per GI Labs per PCP Call prn problems or concerns

## 2013-04-07 ENCOUNTER — Other Ambulatory Visit: Payer: Self-pay

## 2013-04-07 ENCOUNTER — Emergency Department (HOSPITAL_COMMUNITY): Payer: PRIVATE HEALTH INSURANCE

## 2013-04-07 ENCOUNTER — Encounter (HOSPITAL_COMMUNITY): Payer: Self-pay | Admitting: Emergency Medicine

## 2013-04-07 ENCOUNTER — Emergency Department (HOSPITAL_COMMUNITY)
Admission: EM | Admit: 2013-04-07 | Discharge: 2013-04-07 | Disposition: A | Discharge status: Home / Self Care | Payer: PRIVATE HEALTH INSURANCE | Attending: Emergency Medicine | Admitting: Emergency Medicine

## 2013-04-07 DIAGNOSIS — Z9861 Coronary angioplasty status: Secondary | ICD-10-CM | POA: Insufficient documentation

## 2013-04-07 DIAGNOSIS — F329 Major depressive disorder, single episode, unspecified: Secondary | ICD-10-CM | POA: Diagnosis not present

## 2013-04-07 DIAGNOSIS — E079 Disorder of thyroid, unspecified: Secondary | ICD-10-CM | POA: Diagnosis not present

## 2013-04-07 DIAGNOSIS — R5381 Other malaise: Secondary | ICD-10-CM | POA: Insufficient documentation

## 2013-04-07 DIAGNOSIS — I129 Hypertensive chronic kidney disease with stage 1 through stage 4 chronic kidney disease, or unspecified chronic kidney disease: Secondary | ICD-10-CM | POA: Insufficient documentation

## 2013-04-07 DIAGNOSIS — F17200 Nicotine dependence, unspecified, uncomplicated: Secondary | ICD-10-CM | POA: Insufficient documentation

## 2013-04-07 DIAGNOSIS — Z79899 Other long term (current) drug therapy: Secondary | ICD-10-CM | POA: Insufficient documentation

## 2013-04-07 DIAGNOSIS — D649 Anemia, unspecified: Secondary | ICD-10-CM | POA: Diagnosis not present

## 2013-04-07 DIAGNOSIS — Z792 Long term (current) use of antibiotics: Secondary | ICD-10-CM | POA: Insufficient documentation

## 2013-04-07 DIAGNOSIS — F3289 Other specified depressive episodes: Secondary | ICD-10-CM | POA: Insufficient documentation

## 2013-04-07 DIAGNOSIS — Z7982 Long term (current) use of aspirin: Secondary | ICD-10-CM | POA: Insufficient documentation

## 2013-04-07 DIAGNOSIS — N189 Chronic kidney disease, unspecified: Secondary | ICD-10-CM | POA: Insufficient documentation

## 2013-04-07 DIAGNOSIS — Z7901 Long term (current) use of anticoagulants: Secondary | ICD-10-CM | POA: Insufficient documentation

## 2013-04-07 DIAGNOSIS — R079 Chest pain, unspecified: Secondary | ICD-10-CM | POA: Diagnosis present

## 2013-04-07 DIAGNOSIS — Z8572 Personal history of non-Hodgkin lymphomas: Secondary | ICD-10-CM | POA: Insufficient documentation

## 2013-04-07 DIAGNOSIS — R55 Syncope and collapse: Secondary | ICD-10-CM | POA: Diagnosis not present

## 2013-04-07 DIAGNOSIS — Z859 Personal history of malignant neoplasm, unspecified: Secondary | ICD-10-CM | POA: Diagnosis not present

## 2013-04-07 DIAGNOSIS — Z8739 Personal history of other diseases of the musculoskeletal system and connective tissue: Secondary | ICD-10-CM | POA: Insufficient documentation

## 2013-04-07 DIAGNOSIS — E785 Hyperlipidemia, unspecified: Secondary | ICD-10-CM | POA: Diagnosis not present

## 2013-04-07 DIAGNOSIS — I2510 Atherosclerotic heart disease of native coronary artery without angina pectoris: Secondary | ICD-10-CM | POA: Insufficient documentation

## 2013-04-07 LAB — TROPONIN I: Troponin I: <0.3 ng/mL (ref <0.30)

## 2013-04-07 LAB — CBC WITH DIFFERENTIAL/PLATELET
Basophils Relative: 0 % (ref 0–1)
Hemoglobin: 11.2 g/dL — ABNORMAL LOW (ref 12.0–15.0)
Lymphs Abs: 0.6 10*3/uL — ABNORMAL LOW (ref 0.7–4.0)
Monocytes Relative: 4 % (ref 3–12)
Neutro Abs: 4.2 10*3/uL (ref 1.7–7.7)
Neutrophils Relative %: 82 % — ABNORMAL HIGH (ref 43–77)
Platelets: 151 10*3/uL (ref 150–400)
RBC: 3.64 MIL/uL — ABNORMAL LOW (ref 3.87–5.11)

## 2013-04-07 LAB — COMPREHENSIVE METABOLIC PANEL
ALT: 9 U/L (ref 0–35)
AST: 17 U/L (ref 0–37)
Albumin: 3.1 g/dL — ABNORMAL LOW (ref 3.5–5.2)
Alkaline Phosphatase: 62 U/L (ref 39–117)
BUN: 23 mg/dL (ref 6–23)
CO2: 21 mEq/L (ref 19–32)
Calcium: 8.9 mg/dL (ref 8.4–10.5)
Chloride: 105 mEq/L (ref 96–112)
Creatinine, Ser: 1.69 mg/dL — ABNORMAL HIGH (ref 0.50–1.10)
GFR calc Af Amer: 34 mL/min — ABNORMAL LOW (ref >90)
GFR calc non Af Amer: 29 mL/min — ABNORMAL LOW (ref >90)
Glucose, Bld: 118 mg/dL — ABNORMAL HIGH (ref 70–99)
Potassium: 3.7 mEq/L (ref 3.5–5.1)
Sodium: 137 mEq/L (ref 135–145)
Total Bilirubin: 0.2 mg/dL — ABNORMAL LOW (ref 0.3–1.2)
Total Protein: 8 g/dL (ref 6.0–8.3)

## 2013-04-07 NOTE — ED Notes (Signed)
Pt states she feels like her heart rate slows when on the left side. Pt states she did pass out yesterday & did not get seen. Pt denies hitting her head. States that has happened before. Pt deines pain at this time.

## 2013-04-07 NOTE — ED Provider Notes (Signed)
CSN: [id number]     Arrival date & time 04/07/13  1851 History   First MD Initiated Contact with Patient 04/07/13 1905     Chief Complaint  Patient presents with  . Chest Pain    Patient is a 72 y.o. female presenting with weakness. The history is provided by the patient.  Weakness This is a new problem. The problem occurs constantly. The problem has not changed since onset.Pertinent negatives include no chest pain and no shortness of breath. Exacerbated by: position. The symptoms are relieved by rest.  pt reports when she lays on her left side she feels weak and her "heart slows down" She did not lose consciousness today No active CP/SOB No vomiting/diarrhea No abd pain reported  She does report brief episode of syncope yesterday, and reports she has had syncope previously and usually due to anemia. No injury reported from syncopal episode No blood loss reported - no recent rectal bleeding reported  Past Medical History  Diagnosis Date  . Lymphoma   . Systemic lupus erythematosus   . Depression   . Hyperlipidemia   . 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 Sjogren's syndrome   . Orthostatic hypotension   . Dysphagia   . Syncope   . Tobacco abuse   . Weight loss   . Anemia   . Hypertension     hypotension  . Chronic anticoagulation 01/29/2012  . Chronic kidney disease   . 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 Thyroid disease   . Coronary artery disease     MI   Past Surgical History  Procedure Laterality Date  . Breast excisional biopsy      Left  . Eye surgery    . Mass excision      Lymphoma  . Coronary angioplasty with stent placement    . Cholecystectomy    . Cataract extraction Bilateral    Family History  Problem Relation Age of Onset  . Coronary artery disease Father   . Coronary artery disease Mother   . Liver disease Mother   . Cancer Brother     esophagus   . Cancer Sister     breast   History  Substance Use Topics  . Smoking status: Current Every Day Smoker -- 0.50 packs/day for 23 years    Types: Cigarettes  . Smokeless tobacco: Never Used  . Alcohol Use: No   OB History   Grav Para Term Preterm Abortions TAB SAB Ect Mult Living   3 3        3      Review of Systems  Constitutional: Positive for fatigue. Negative for fever.  Respiratory: Negative for shortness of breath.   Cardiovascular: Negative for chest pain.  Neurological: Positive for weakness.  All other systems reviewed and are negative.    Allergies  Florinef; Iohexol; Sulfonamide derivatives; and Tape  Home Medications   Current Outpatient Rx  Name  Route  Sig  Dispense  Refill  . acetaminophen-codeine (TYLENOL #3) 300-30 MG per tablet   Oral   Take 1 tablet by mouth 4 (four) times daily as needed. For pain         . ALPRAZolam (XANAX) 0.5 MG tablet   Oral   Take 0.5 mg by mouth 3 (three) times daily.          Marland Kitchen amitriptyline (  ELAVIL) 100 MG tablet   Oral   Take 100 mg by mouth at bedtime.           Marland Kitchen aspirin EC 81 MG tablet   Oral   Take 81 mg by mouth at bedtime.          Marland Kitchen atorvastatin (LIPITOR) 40 MG tablet   Oral   Take 40 mg by mouth at bedtime.          . Biotin 5000 MCG CAPS   Oral   Take 5,000 mcg by mouth every evening.          . Carboxymethylcellul-Glycerin (OPTIVE SENSITIVE OP)   Ophthalmic   Apply 1 drop to eye 3 (three) times daily.          . cephALEXin (KEFLEX) 500 MG capsule   Oral   Take 1 capsule (500 mg total) by mouth 2 (two) times daily.   10 capsule   0   . Cholecalciferol (VITAMIN D) 400 UNITS capsule   Oral   Take 400 Units by mouth every evening.          . clobetasol cream (TEMOVATE) 0.05 %   Topical   Apply 0.05 application topically 2 (two) times daily.           Marland Kitchen docusate sodium ([employer] STOOL SOFTENER) 100 MG capsule   Oral   Take 100 mg by mouth at bedtime.         Marland Kitchen EXPIRED:  fludrocortisone (FLORINEF) 0.1 MG tablet   Oral   Take 1 tablet (0.1 mg total) by mouth 2 (two) times daily.   60 tablet   3   . folic acid (FOLVITE) 1 MG tablet   Oral   Take 1 mg by mouth every morning.          Marland Kitchen HYDROcodone-acetaminophen (NORCO/VICODIN) 5-325 MG per tablet   Oral   Take 1 tablet by mouth 4 (four) times daily.         . hydroxychloroquine (PLAQUENIL) 200 MG tablet               . iron polysaccharides (NIFEREX) 150 MG capsule   Oral   Take 150 mg by mouth daily.         Marland Kitchen KLOR-CON 10 10 MEQ tablet               . levothyroxine (SYNTHROID, LEVOTHROID) 25 MCG tablet   Oral   Take 25 mcg by mouth every morning.          . Multiple Vitamins-Minerals (PRESERVISION/LUTEIN) CAPS   Oral   Take 1 capsule by mouth 2 (two) times daily.          . nitroGLYCERIN (NITROSTAT) 0.4 MG SL tablet   Sublingual   Place 1 tablet (0.4 mg total) under the tongue every 5 (five) minutes as needed.   100 tablet   6   . Omega-3 Fatty Acids (FISH OIL) 1000 MG CAPS   Oral   Take 1 capsule by mouth at bedtime.          Marland Kitchen omeprazole (PRILOSEC) 20 MG capsule   Oral   Take 20 mg by mouth 2 (two) times daily.          . pilocarpine (SALAGEN) 5 MG tablet   Oral   Take 5 mg by mouth 3 (three) times daily.         . potassium chloride SA (K-DUR,KLOR-CON) 20 MEQ tablet   Oral  Take 20 mEq by mouth 3 (three) times daily.         . raloxifene (EVISTA) 60 MG tablet   Oral   Take 60 mg by mouth daily.           . RESTASIS 0.05 % ophthalmic emulsion   Both Eyes   Place 1 drop into both eyes 2 (two) times daily.         . sodium bicarbonate 325 MG tablet   Oral   Take 325 mg by mouth 3 (three) times daily.           BP 151/71  Pulse 108  Temp(Src) 99.4 F (37.4 C) (Oral)  Ht 5\' 9"  (1.753 m)  Wt 145 lb (65.772 kg)  BMI 21.40 kg/m2  SpO2 100% Physical Exam CONSTITUTIONAL: Well developed/well nourished HEAD:  Normocephalic/atraumatic EYES: EOMI/PERRL ENMT: Mucous membranes moist NECK: supple no meningeal signs SPINE:entire spine nontender, No bruising/crepitance/stepoffs noted to spine CV: irregular no murmurs/rubs/gallops noted LUNGS: Lungs are clear to auscultation bilaterally, no apparent distress ABDOMEN: soft, nontender, no rebound or guarding GU:no cva tenderness NEURO: Pt is awake/alert, moves all extremitiesx4 EXTREMITIES: pulses normal, full ROM, no LE edema noted, no tenderness, All extremities/joints palpated/ranged and nontender SKIN: warm, color normal PSYCH: no abnormalities of mood noted  ED Course  Procedures (including critical care time)   7:36 PM Pt with fatigue and feels heart slowing down while laying on her side.  I had her lay on her side and this did not show any change on her tele monitoring Will monitor in the ED Last echo showed some depressed EF   Pt felt improved after ER stay She denies active CP She reports she has syncope 1-2x/month, this is not new. I offered admission for further testing/monitoring and may need repeat echo She deferred and will f/u as outpatient Message sent to her cardiology team for close followup We discussed strict return precautions  Labs Review Labs Reviewed  CBC WITH DIFFERENTIAL  COMPREHENSIVE METABOLIC PANEL  TROPONIN I   Imaging Review No results found.  EKG Interpretation    Date/Time:  Wednesday April 07 2013 18:53:57 EST Ventricular Rate:  86 PR Interval:  186 QRS Duration: 96 QT Interval:  386 QTC Calculation: 461 R Axis:   73 Text Interpretation:  Sinus rhythm with occasional Premature ventricular complexes Otherwise normal ECG When compared with ECG of 10-Apr-2012 16:03, No significant change was found Confirmed by Bebe Shaggy  MD, Tyshia Fenter [phone]) on 04/07/2013 7:07:14 PM            MDM  No diagnosis found. Nursing notes including past medical history and social history reviewed and considered in  documentation Previous records reviewed and considered- previous cardiology notes reviewed xrays reviewed and considered Labs/vital reviewed and considered     Joya Gaskins, MD 04/07/13 2108

## 2013-04-07 NOTE — ED Notes (Signed)
Pt reports "my heart is beating slow". Pt reports dizziness,and generalized weakness. Pt denies any gu/gi symptoms. Pt alert and oriented. Airway patent.

## 2013-04-07 NOTE — ED Notes (Signed)
Pt alert & oriented x4, stable gait. Patient  given discharge instructions, paperwork & prescription(s). Patient Parent verbalized understanding, Pt left department via wheelchair w/ no further questions.

## 2013-04-21 ENCOUNTER — Encounter: Payer: Self-pay | Admitting: Adult Health

## 2013-04-21 ENCOUNTER — Ambulatory Visit (INDEPENDENT_AMBULATORY_CARE_PROVIDER_SITE_OTHER): Payer: PRIVATE HEALTH INSURANCE | Admitting: Adult Health

## 2013-04-21 VITALS — BP 142/85 | HR 93 | Ht 70.0 in | Wt 146.0 lb

## 2013-04-21 DIAGNOSIS — I951 Orthostatic hypotension: Secondary | ICD-10-CM

## 2013-04-21 DIAGNOSIS — R002 Palpitations: Secondary | ICD-10-CM

## 2013-04-21 MED ORDER — ATORVASTATIN CALCIUM 40 MG PO TABS: 40.0000 mg | ORAL_TABLET | Freq: Every day | ORAL | Status: DC

## 2013-04-21 MED ORDER — FLUDROCORTISONE ACETATE 0.1 MG PO TABS: 0.1000 mg | ORAL_TABLET | Freq: Every day | ORAL | Status: DC

## 2013-04-21 NOTE — Progress Notes (Deleted)
Name: Tiffany [name]    DOB: [birth year]  Age: 72 y.o.  MR#: [id number]       PCP:  Colon Branch, MD      Insurance: Payor: Cleatrice Burke MEDICARE / Plan: Ollen Gross / Product Type: *No Product type* /   CC:    Chief Complaint  Patient presents with  . Palpitations    VS Filed Vitals:   04/21/13 1306  BP: 142/85  Pulse: 93  Height: 5\' 10"  (1.778 m)  Weight: 146 lb (66.225 kg)    Weights Current Weight  04/21/13 146 lb (66.225 kg)  04/07/13 145 lb (65.772 kg)  04/06/13 149 lb 8 oz (67.813 kg)    Blood Pressure  BP Readings from Last 3 Encounters:  04/21/13 142/85  04/07/13 156/83  04/06/13 160/84     Admit date:  (Not on file) Last encounter with RMR:  Visit date not found   Allergy Florinef; Iohexol; Sulfonamide derivatives; and Tape  Current Outpatient Prescriptions  Medication Sig Dispense Refill  . ALPRAZolam (XANAX) 0.5 MG tablet Take 0.5 mg by mouth 3 (three) times daily.       Marland Kitchen amitriptyline (ELAVIL) 100 MG tablet Take 100 mg by mouth at bedtime.        Marland Kitchen aspirin EC 81 MG tablet Take 81 mg by mouth at bedtime.       Marland Kitchen atorvastatin (LIPITOR) 40 MG tablet Take 40 mg by mouth at bedtime.       . Biotin 5000 MCG CAPS Take 5,000 mcg by mouth every evening.       . Carboxymethylcellul-Glycerin (OPTIVE SENSITIVE OP) Apply 1 drop to eye 2 (two) times daily.       . Cholecalciferol (VITAMIN D) 400 UNITS capsule Take 400 Units by mouth every evening.       . clobetasol cream (TEMOVATE) 0.05 % Apply 0.05 application topically 2 (two) times daily.        Marland Kitchen docusate sodium ([employer]S STOOL SOFTENER) 100 MG capsule Take 100 mg by mouth at bedtime.      . folic acid (FOLVITE) 1 MG tablet Take 1 mg by mouth every morning.       Marland Kitchen HYDROcodone-acetaminophen (NORCO/VICODIN) 5-325 MG per tablet Take 1 tablet by mouth 2 (two) times daily.       . hydroxychloroquine (PLAQUENIL) 200 MG tablet Take 200 mg by mouth daily.       . iron polysaccharides (NIFEREX) 150 MG capsule Take 150 mg by  mouth daily.      Marland Kitchen KLOR-CON 10 10 MEQ tablet Take 10 mEq by mouth 2 (two) times daily.       Marland Kitchen levothyroxine (SYNTHROID, LEVOTHROID) 25 MCG tablet Take 25 mcg by mouth every morning.       . Multiple Vitamins-Minerals (PRESERVISION/LUTEIN) CAPS Take 1 capsule by mouth 2 (two) times daily.       . nitroGLYCERIN (NITROSTAT) 0.4 MG SL tablet Place 1 tablet (0.4 mg total) under the tongue every 5 (five) minutes as needed.  100 tablet  6  . Omega-3 Fatty Acids (FISH OIL) 1000 MG CAPS Take 1 capsule by mouth at bedtime.       Marland Kitchen omeprazole (PRILOSEC) 20 MG capsule Take 20 mg by mouth 2 (two) times daily.       . raloxifene (EVISTA) 60 MG tablet Take 60 mg by mouth daily.        . RESTASIS 0.05 % ophthalmic emulsion Place 1 drop into both eyes 2 (two) times daily.      Marland Kitchen  sodium bicarbonate 325 MG tablet Take 650 mg by mouth 3 (three) times daily.       . fludrocortisone (FLORINEF) 0.1 MG tablet Take 1 tablet (0.1 mg total) by mouth 2 (two) times daily.  60 tablet  3   No current facility-administered medications for this visit.    Discontinued Meds:   There are no discontinued medications.  Patient Active Problem List   Diagnosis Date Noted  . Chronic anticoagulation 01/29/2012  . Pulmonary embolism 11/28/2011  . Chest pain 09/06/2011  . Anemia 05/24/2011  . Chronic kidney disease 05/24/2011  . Hypertension   . Hyperlipidemia   . Arteriosclerotic cardiovascular disease (ASCVD)   . Sjogren's syndrome   . Tobacco abuse 08/08/2010  . WEIGHT LOSS, ABNORMAL 07/05/2010  . Orthostatic hypotension 01/24/2010  . SYNCOPE 08/28/2009  . LYMPHOMA 10/31/2008    LABS    Component Value Date/Time   NA 137 04/07/2013 1909   NA 135 06/13/2012 1906   NA 140 06/01/2012 1413   K 3.7 04/07/2013 1909   K 3.6 06/13/2012 1906   K 4.3 06/01/2012 1413   CL 105 04/07/2013 1909   CL 104 06/13/2012 1906   CL 111 06/01/2012 1413   CO2 21 04/07/2013 1909   CO2 23 06/13/2012 1906   CO2 24 06/01/2012 1413   GLUCOSE  118* 04/07/2013 1909   GLUCOSE 86 06/13/2012 1906   GLUCOSE 86 06/01/2012 1413   BUN 23 04/07/2013 1909   BUN 24* 06/13/2012 1906   BUN 25* 06/01/2012 1413   CREATININE 1.69* 04/07/2013 1909   CREATININE 1.74* 06/13/2012 1906   CREATININE 1.72* 06/01/2012 1413   CREATININE 1.87* 05/08/2012 1440   CREATININE 1.88* 04/10/2012 1647   CREATININE 1.73* 12/06/2011 1345   CALCIUM 8.9 04/07/2013 1909   CALCIUM 8.9 06/13/2012 1906   CALCIUM 9.1 06/01/2012 1413   GFRNONAA 29* 04/07/2013 1909   GFRNONAA 29* 06/13/2012 1906   GFRNONAA 26* 04/10/2012 1647   GFRAA 34* 04/07/2013 1909   GFRAA 33* 06/13/2012 1906   GFRAA 30* 04/10/2012 1647   CMP     Component Value Date/Time   NA 137 04/07/2013 1909   K 3.7 04/07/2013 1909   CL 105 04/07/2013 1909   CO2 21 04/07/2013 1909   GLUCOSE 118* 04/07/2013 1909   BUN 23 04/07/2013 1909   CREATININE 1.69* 04/07/2013 1909   CREATININE 1.72* 06/01/2012 1413   CALCIUM 8.9 04/07/2013 1909   PROT 8.0 04/07/2013 1909   ALBUMIN 3.1* 04/07/2013 1909   AST 17 04/07/2013 1909   ALT 9 04/07/2013 1909   ALKPHOS 62 04/07/2013 1909   BILITOT 0.2* 04/07/2013 1909   GFRNONAA 29* 04/07/2013 1909   GFRAA 34* 04/07/2013 1909       Component Value Date/Time   WBC 5.1 04/07/2013 1909   WBC 3.8* 06/13/2012 1906   WBC 3.5* 04/10/2012 1647   HGB 11.2* 04/07/2013 1909   HGB 10.4* 06/13/2012 1906   HGB 10.5* 04/10/2012 1647   HCT 34.8* 04/07/2013 1909   HCT 32.8* 06/13/2012 1906   HCT 32.7* 04/10/2012 1647   MCV 95.6 04/07/2013 1909   MCV 93.4 06/13/2012 1906   MCV 92.1 04/10/2012 1647    Lipid Panel     Component Value Date/Time   CHOL 152 05/15/2011 0000   TRIG 61 05/15/2011 0000   HDL 58 05/15/2011 0000   CHOLHDL 2.6 05/15/2011 0000   VLDL 12 05/15/2011 0000   LDLCALC 82 05/15/2011 0000    ABG No results found  for this basename: phart, pco2, pco2art, po2, po2art, hco3, tco2, acidbasedef, o2sat     Lab Results  Component Value Date   TSH 4.193 07/17/2010   BNP (last 3  results) No results found for this basename: PROBNP,  in the last 8760 hours Cardiac Panel (last 3 results) No results found for this basename: CKTOTAL, CKMB, TROPONINI, RELINDX,  in the last 72 hours  Iron/TIBC/Ferritin    Component Value Date/Time   IRON 48 05/24/2011 1211   TIBC 336 05/24/2011 1211   FERRITIN 16 05/24/2011 1211     EKG Orders placed during the hospital encounter of 04/07/13  . ED EKG  . ED EKG  . EKG     Prior Assessment and Plan Problem List as of 04/21/2013     Cardiovascular and Mediastinum   Orthostatic hypotension   Last Assessment & Plan   05/24/2011 Office Visit Edited 05/24/2011  1:00 PM by Kathlen Brunswick, MD     Control of orthostatic hypotension and symptoms is probably about the best we can do without causing unacceptable supine hypertension.  Patient was advised to discontinue ProAmatine, but does not know by whom or why.  She does not appear to need 2 medications at present.  Current treatment with Florinef will be continued.    SYNCOPE   Last Assessment & Plan   06/01/2012 Office Visit Written 06/01/2012  2:39 PM by Jodelle Gross, NP     No further complaints of syncope, dizziness or near syncope.    Hypertension   Last Assessment & Plan   06/01/2012 Office Visit Written 06/01/2012  2:38 PM by Jodelle Gross, NP     Essentially good control of BP at present. Heart rate is irregular. EKG shows frequent PVC's. I have her on potassium replacement with history of hypokalemia. I will check labs today as her PCP adjusted her dose, and she is finding that palpitations are worsening.      Arteriosclerotic cardiovascular disease (ASCVD)   Last Assessment & Plan   06/01/2012 Office Visit Written 06/01/2012  2:39 PM by Jodelle Gross, NP     Other than frequent palpitations, she is without complaint. No chest pain or significiant DOE.    Pulmonary embolism   Last Assessment & Plan   11/28/2011 Office Visit Written 11/28/2011  4:09 PM by Jodelle Gross, NP     She is currently on Xarelto but has not started this yet. Will begin today. I will check CBC and BMET for follow-up labs with history of anemia and CKD on this medication. Hemoccult cards will be provided.        Digestive   Sjogren's syndrome     Genitourinary   Chronic kidney disease   Last Assessment & Plan   04/16/2012 Office Visit Written 04/16/2012  4:33 PM by Jodelle Gross, NP     Repeat labs in 2 weeks for ongoing assessment.      Other   LYMPHOMA   WEIGHT LOSS, ABNORMAL   Last Assessment & Plan   05/24/2011 Office Visit Written 05/24/2011  1:01 PM by Kathlen Brunswick, MD     Weight has been stable since her last visit.    Tobacco abuse   Hyperlipidemia   Last Assessment & Plan   05/24/2011 Office Visit Written 05/24/2011 12:56 PM by Kathlen Brunswick, MD     Lipid control was excellent when last assessed one month ago.  Current therapy will be continued.  Anemia   Last Assessment & Plan   05/24/2011 Office Visit Written 05/24/2011 12:53 PM by Kathlen Brunswick, MD     Patient has had long-standing anemia, present at this level or worse for at least the past few years.  Iron studies obtained in 2011 were nondiagnostic with serum iron of 67, TIBC of 179, saturation 37% and a ferritin of 56, subsequently measured at 93.  She reports colonoscopy approximately 3-4 years ago with negative findings.  Anemia appears out of portion to her connective tissue problems.  Stool for Hemoccult testing will be obtained.  TSH was normal in 2012 as was cortisol in 2011.  She has never been seen by a hematologist and will address a referral at her next visit with her PCP.    Chest pain   Last Assessment & Plan   04/16/2012 Office Visit Written 04/16/2012  4:32 PM by Jodelle Gross, NP     She has no further complaints at this time. I did notice that she is hypokalemic with level of 3.3 that has not been repleted. She was taking potassium 20 mEq  BID but had been on it TID.  She will return to TID dose. Follow-labs will be completed in a couple of weeks. She will see [place] again in 2 months and then return to every 6 months if all is well.    Chronic anticoagulation       Imaging: Dg Chest Portable 1 View  04/07/2013   CLINICAL DATA:  Bradycardia and chest discomfort.  And history of MI  EXAM: PORTABLE CHEST - 1 VIEW  COMPARISON:  Chest x-ray from an abdominal study of November 10, 2012  FINDINGS: The lungs are well-expanded. The interstitial markings are mildly increased. The cardiac silhouette is top-normal in size. The pulmonary vascularity is not engorged. There is no pleural effusion or pneumothorax. There is a small amount of stable apical pleural thickening on the left. The mediastinum is normal in width. The observed portions of the bony thorax appear normal.  IMPRESSION: 1. There is no evidence of pneumonia nor CHF. 2. Mildly increased interstitial markings and mild hyperinflation suggests underlying COPD given the patient's smoking history.   Electronically Signed   By: David  [place]   On: 04/07/2013 19:40

## 2013-04-21 NOTE — Assessment & Plan Note (Signed)
I have explained to her that she does not tolerate a lower more normal BP as this causes significant symptoms of dizziness and near syncope. She will continue on florinef 0.1 mg daily. She does not wish to take it at night. I have reviewed labs and found them to be WNL from ER.

## 2013-04-21 NOTE — Assessment & Plan Note (Signed)
Review of EKG from ER and from today's office visit shows that she is having PVC's. No couplets or bigeminy. They appear to be benign. Reassurance is given. Will see her in 6 months. She is advised to decrease caffeine as she drinks several Dr. Alcus Dad a day.

## 2013-04-21 NOTE — Patient Instructions (Signed)
Your physician recommends that you schedule a follow-up appointment in: 6 MONTHS WITH DR BRANCH You will receive a reminder letter two months in advance reminding you to call and schedule your appointment. If you don't receive this letter, please contact our office.  Your physician recommends that you continue on your current medications as directed. Please refer to the Current Medication list given to you today.   

## 2013-04-21 NOTE — Progress Notes (Signed)
HPI: Tiffany [name] is a pleasant 72 y/o former patient of Dr.Rothbart we are seeing for ongoing assessment and treatment of CAD, frequent non-life threatening ventricular arrhythmias, hypercholesterolemia, with history of CKD, and anemia. The patient was recently seen in the emergency room on 04/07/2013 with complaints of weakness. The patient labs and EKG were found to be normal. She was asked to followup with cardiology.   She comes today with continued complaints of weakness and irregular heart rate. Denies chest pain or dizziness.  She is concerned that her BP is higher.   Allergies  Allergen Reactions  . Florinef [Fludrocortisone Acetate]     Malaise: Discomfort   . Iohexol Other (See Comments)    Pt. States that Dr. Lowell Guitar her kidney Dr. Does not want her to receive iv dye due to increased risk of kidney failure.   . Sulfonamide Derivatives Hives  . Tape Rash    ADHESIVE    Current Outpatient Prescriptions  Medication Sig Dispense Refill  . ALPRAZolam (XANAX) 0.5 MG tablet Take 0.5 mg by mouth 3 (three) times daily.       Marland Kitchen amitriptyline (ELAVIL) 100 MG tablet Take 100 mg by mouth at bedtime.        Marland Kitchen aspirin EC 81 MG tablet Take 81 mg by mouth at bedtime.       Marland Kitchen atorvastatin (LIPITOR) 40 MG tablet Take 40 mg by mouth at bedtime.       . Biotin 5000 MCG CAPS Take 5,000 mcg by mouth every evening.       . Carboxymethylcellul-Glycerin (OPTIVE SENSITIVE OP) Apply 1 drop to eye 2 (two) times daily.       . Cholecalciferol (VITAMIN D) 400 UNITS capsule Take 400 Units by mouth every evening.       . clobetasol cream (TEMOVATE) 0.05 % Apply 0.05 application topically 2 (two) times daily.        Marland Kitchen docusate sodium ([employer] STOOL SOFTENER) 100 MG capsule Take 100 mg by mouth at bedtime.      . folic acid (FOLVITE) 1 MG tablet Take 1 mg by mouth every morning.       Marland Kitchen HYDROcodone-acetaminophen (NORCO/VICODIN) 5-325 MG per tablet Take 1 tablet by mouth 2 (two) times daily.       .  hydroxychloroquine (PLAQUENIL) 200 MG tablet Take 200 mg by mouth daily.       . iron polysaccharides (NIFEREX) 150 MG capsule Take 150 mg by mouth daily.      Marland Kitchen KLOR-CON 10 10 MEQ tablet Take 10 mEq by mouth 2 (two) times daily.       Marland Kitchen levothyroxine (SYNTHROID, LEVOTHROID) 25 MCG tablet Take 25 mcg by mouth every morning.       . Multiple Vitamins-Minerals (PRESERVISION/LUTEIN) CAPS Take 1 capsule by mouth 2 (two) times daily.       . nitroGLYCERIN (NITROSTAT) 0.4 MG SL tablet Place 1 tablet (0.4 mg total) under the tongue every 5 (five) minutes as needed.  100 tablet  6  . Omega-3 Fatty Acids (FISH OIL) 1000 MG CAPS Take 1 capsule by mouth at bedtime.       Marland Kitchen omeprazole (PRILOSEC) 20 MG capsule Take 20 mg by mouth 2 (two) times daily.       . raloxifene (EVISTA) 60 MG tablet Take 60 mg by mouth daily.        . RESTASIS 0.05 % ophthalmic emulsion Place 1 drop into both eyes 2 (two) times daily.      Marland Kitchen  sodium bicarbonate 325 MG tablet Take 650 mg by mouth 3 (three) times daily.       . fludrocortisone (FLORINEF) 0.1 MG tablet Take 1 tablet (0.1 mg total) by mouth 2 (two) times daily.  60 tablet  3   No current facility-administered medications for this visit.    Past Medical History  Diagnosis Date  . Lymphoma   . Systemic lupus erythematosus   . Depression   . Hyperlipidemia   . 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 Sjogren's syndrome   . Orthostatic hypotension   . Dysphagia   . Syncope   . Tobacco abuse   . Weight loss   . Anemia   . Hypertension     hypotension  . Chronic anticoagulation 01/29/2012  . Chronic kidney disease   . 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 Thyroid disease   . Coronary artery disease     MI    Past Surgical History  Procedure Laterality Date  . Breast excisional biopsy      Left  . Eye surgery    . Mass excision      Lymphoma  . Coronary angioplasty  with stent placement    . Cholecystectomy    . Cataract extraction Bilateral     NWG:NFAOZH of systems complete and found to be negative unless listed above PHYSICAL EXAM BP 142/85  Pulse 93  Ht 5\' 10"  (1.778 m)  Wt 146 lb (66.225 kg)  BMI 20.95 kg/m2  General: Well developed, well nourished, in no acute distress Head: Eyes PERRLA, No xanthomas.   Normal cephalic and atramatic  Lungs: Clear bilaterally to auscultation and percussion. Heart: HRRR S1 S2, occasional extra systole, without MRG.  Pulses are 2+ & equal.            No carotid bruit. No JVD.  No abdominal bruits. No femoral bruits. Abdomen: Bowel sounds are positive, abdomen soft and non-tender without masses or                  Hernia's noted. Msk:  Back normal, normal gait. Normal strength and tone for age. Extremities: No clubbing, cyanosis or edema.  DP +1 Neuro: Alert and oriented X 3. Psych:  Good affect, responds appropriately  EKG: NSR with PVC's rate of 81 bpm.  ASSESSMENT AND PLAN

## 2013-10-22 ENCOUNTER — Emergency Department (HOSPITAL_COMMUNITY): Payer: PRIVATE HEALTH INSURANCE

## 2013-10-22 ENCOUNTER — Observation Stay (HOSPITAL_COMMUNITY)
Admission: EM | Admit: 2013-10-22 | Discharge: 2013-10-24 | Disposition: A | Discharge status: Home / Self Care | Payer: PRIVATE HEALTH INSURANCE | Attending: Family Medicine | Admitting: Family Medicine

## 2013-10-22 ENCOUNTER — Encounter (HOSPITAL_COMMUNITY): Payer: Self-pay | Admitting: Emergency Medicine

## 2013-10-22 DIAGNOSIS — R059 Cough, unspecified: Secondary | ICD-10-CM | POA: Insufficient documentation

## 2013-10-22 DIAGNOSIS — F17200 Nicotine dependence, unspecified, uncomplicated: Secondary | ICD-10-CM | POA: Diagnosis not present

## 2013-10-22 DIAGNOSIS — Z7982 Long term (current) use of aspirin: Secondary | ICD-10-CM | POA: Insufficient documentation

## 2013-10-22 DIAGNOSIS — R0789 Other chest pain: Secondary | ICD-10-CM | POA: Diagnosis not present

## 2013-10-22 DIAGNOSIS — I2510 Atherosclerotic heart disease of native coronary artery without angina pectoris: Secondary | ICD-10-CM | POA: Diagnosis not present

## 2013-10-22 DIAGNOSIS — R05 Cough: Secondary | ICD-10-CM | POA: Insufficient documentation

## 2013-10-22 DIAGNOSIS — R0602 Shortness of breath: Secondary | ICD-10-CM | POA: Diagnosis present

## 2013-10-22 DIAGNOSIS — I7090 Unspecified atherosclerosis: Secondary | ICD-10-CM | POA: Diagnosis not present

## 2013-10-22 DIAGNOSIS — D649 Anemia, unspecified: Secondary | ICD-10-CM

## 2013-10-22 DIAGNOSIS — Z720 Tobacco use: Secondary | ICD-10-CM

## 2013-10-22 DIAGNOSIS — I252 Old myocardial infarction: Secondary | ICD-10-CM | POA: Insufficient documentation

## 2013-10-22 DIAGNOSIS — M3500 Sicca syndrome, unspecified: Secondary | ICD-10-CM

## 2013-10-22 DIAGNOSIS — F3289 Other specified depressive episodes: Secondary | ICD-10-CM | POA: Diagnosis not present

## 2013-10-22 DIAGNOSIS — R0989 Other specified symptoms and signs involving the circulatory and respiratory systems: Secondary | ICD-10-CM

## 2013-10-22 DIAGNOSIS — R002 Palpitations: Secondary | ICD-10-CM

## 2013-10-22 DIAGNOSIS — N184 Chronic kidney disease, stage 4 (severe): Secondary | ICD-10-CM | POA: Diagnosis present

## 2013-10-22 DIAGNOSIS — R55 Syncope and collapse: Secondary | ICD-10-CM

## 2013-10-22 DIAGNOSIS — R509 Fever, unspecified: Secondary | ICD-10-CM | POA: Insufficient documentation

## 2013-10-22 DIAGNOSIS — M329 Systemic lupus erythematosus, unspecified: Secondary | ICD-10-CM | POA: Diagnosis not present

## 2013-10-22 DIAGNOSIS — I129 Hypertensive chronic kidney disease with stage 1 through stage 4 chronic kidney disease, or unspecified chronic kidney disease: Secondary | ICD-10-CM | POA: Insufficient documentation

## 2013-10-22 DIAGNOSIS — E079 Disorder of thyroid, unspecified: Secondary | ICD-10-CM | POA: Insufficient documentation

## 2013-10-22 DIAGNOSIS — R634 Abnormal weight loss: Secondary | ICD-10-CM | POA: Diagnosis not present

## 2013-10-22 DIAGNOSIS — I509 Heart failure, unspecified: Principal | ICD-10-CM

## 2013-10-22 DIAGNOSIS — I2699 Other pulmonary embolism without acute cor pulmonale: Secondary | ICD-10-CM

## 2013-10-22 DIAGNOSIS — Z79899 Other long term (current) drug therapy: Secondary | ICD-10-CM | POA: Insufficient documentation

## 2013-10-22 DIAGNOSIS — E785 Hyperlipidemia, unspecified: Secondary | ICD-10-CM

## 2013-10-22 DIAGNOSIS — Z7901 Long term (current) use of anticoagulants: Secondary | ICD-10-CM | POA: Diagnosis not present

## 2013-10-22 DIAGNOSIS — I25119 Atherosclerotic heart disease of native coronary artery with unspecified angina pectoris: Secondary | ICD-10-CM | POA: Diagnosis present

## 2013-10-22 DIAGNOSIS — R0609 Other forms of dyspnea: Secondary | ICD-10-CM

## 2013-10-22 DIAGNOSIS — I951 Orthostatic hypotension: Secondary | ICD-10-CM

## 2013-10-22 DIAGNOSIS — Z87898 Personal history of other specified conditions: Secondary | ICD-10-CM | POA: Insufficient documentation

## 2013-10-22 DIAGNOSIS — F329 Major depressive disorder, single episode, unspecified: Secondary | ICD-10-CM | POA: Diagnosis not present

## 2013-10-22 DIAGNOSIS — I10 Essential (primary) hypertension: Secondary | ICD-10-CM

## 2013-10-22 DIAGNOSIS — R079 Chest pain, unspecified: Secondary | ICD-10-CM

## 2013-10-22 DIAGNOSIS — Z9861 Coronary angioplasty status: Secondary | ICD-10-CM | POA: Diagnosis not present

## 2013-10-22 DIAGNOSIS — C8589 Other specified types of non-Hodgkin lymphoma, extranodal and solid organ sites: Secondary | ICD-10-CM

## 2013-10-22 DIAGNOSIS — N189 Chronic kidney disease, unspecified: Secondary | ICD-10-CM | POA: Diagnosis not present

## 2013-10-22 LAB — CBC WITH DIFFERENTIAL/PLATELET
Basophils Absolute: 0 10*3/uL (ref 0.0–0.1)
Basophils Relative: 0 % (ref 0–1)
Eosinophils Absolute: 0.1 10*3/uL (ref 0.0–0.7)
Eosinophils Relative: 2 % (ref 0–5)
HCT: 35.6 % — ABNORMAL LOW (ref 36.0–46.0)
HEMOGLOBIN: 11.6 g/dL — AB (ref 12.0–15.0)
LYMPHS ABS: 1.4 10*3/uL (ref 0.7–4.0)
LYMPHS PCT: 23 % (ref 12–46)
MCH: 30.9 pg (ref 26.0–34.0)
MCHC: 32.6 g/dL (ref 30.0–36.0)
MCV: 94.9 fL (ref 78.0–100.0)
MONOS PCT: 11 % (ref 3–12)
Monocytes Absolute: 0.6 10*3/uL (ref 0.1–1.0)
NEUTROS ABS: 3.7 10*3/uL (ref 1.7–7.7)
NEUTROS PCT: 64 % (ref 43–77)
Platelets: 201 10*3/uL (ref 150–400)
RBC: 3.75 MIL/uL — AB (ref 3.87–5.11)
RDW: 15 % (ref 11.5–15.5)
WBC: 5.8 10*3/uL (ref 4.0–10.5)

## 2013-10-22 LAB — URINALYSIS, ROUTINE W REFLEX MICROSCOPIC
Bilirubin Urine: NEGATIVE
Glucose, UA: NEGATIVE mg/dL
Hgb urine dipstick: NEGATIVE
KETONES UR: NEGATIVE mg/dL
NITRITE: NEGATIVE
PH: 6.5 (ref 5.0–8.0)
Protein, ur: 30 mg/dL — AB
Specific Gravity, Urine: 1.02 (ref 1.005–1.030)
Urobilinogen, UA: 0.2 mg/dL (ref 0.0–1.0)

## 2013-10-22 LAB — BASIC METABOLIC PANEL
BUN: 23 mg/dL (ref 6–23)
CO2: 21 mEq/L (ref 19–32)
Calcium: 9.1 mg/dL (ref 8.4–10.5)
Chloride: 103 mEq/L (ref 96–112)
Creatinine, Ser: 1.51 mg/dL — ABNORMAL HIGH (ref 0.50–1.10)
GFR, EST AFRICAN AMERICAN: 39 mL/min — AB (ref >90)
GFR, EST NON AFRICAN AMERICAN: 34 mL/min — AB (ref >90)
Glucose, Bld: 77 mg/dL (ref 70–99)
POTASSIUM: 4.2 meq/L (ref 3.7–5.3)
Sodium: 138 mEq/L (ref 137–147)

## 2013-10-22 LAB — TROPONIN I

## 2013-10-22 LAB — URINE MICROSCOPIC-ADD ON

## 2013-10-22 LAB — PRO B NATRIURETIC PEPTIDE: Pro B Natriuretic peptide (BNP): 5385 pg/mL — ABNORMAL HIGH (ref 0–125)

## 2013-10-22 MED ORDER — HYDROCODONE-ACETAMINOPHEN 5-325 MG PO TABS
1.0000 | ORAL_TABLET | Freq: Once | ORAL | Status: AC
  Administered 2013-10-22: 1 via ORAL
  Filled 2013-10-22: qty 1

## 2013-10-22 MED ORDER — FUROSEMIDE 10 MG/ML IJ SOLN
40.0000 mg | Freq: Once | INTRAMUSCULAR | Status: AC
  Administered 2013-10-22: 40 mg via INTRAVENOUS
  Filled 2013-10-22: qty 4

## 2013-10-22 NOTE — ED Notes (Signed)
Pt co shortness of breath and tightness in her chest on inspiration, pt brought in by EMS, referred to ER by PCP.

## 2013-10-22 NOTE — ED Provider Notes (Signed)
CSN: [id number]     Arrival date & time 10/22/13  1621 History  This chart was scribed for Orpah Greek, MD by Ladene Artist, ED Scribe. The patient was seen in room APA18/APA18. Patient's care was started at 4:45 PM.   Chief Complaint  Patient presents with  . Shortness of Breath   The history is provided by the patient. No language interpreter was used.   HPI Comments: Tiffany [name] is a 73 y.o. female who presents to the Emergency Department complaining of SOB onset 1-2 weeks ago. SOB is worsened with walking. Pt reports associated chest pressure, fever, dry cough with rattling onset 3 days ago. Tmax 101.8 F, ED temperature 98.1 F. Pt has a stent. Pt denies similar pain.  Past Medical History  Diagnosis Date  . Lymphoma   . Systemic lupus erythematosus   . Depression   . Hyperlipidemia   . 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 Sjogren's syndrome   . Orthostatic hypotension   . Dysphagia   . Syncope   . Tobacco abuse   . Weight loss   . Anemia   . Hypertension     hypotension  . Chronic anticoagulation 01/29/2012  . Chronic kidney disease   . 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 Thyroid disease   . Coronary artery disease     MI   Past Surgical History  Procedure Laterality Date  . Breast excisional biopsy      Left  . Eye surgery    . Mass excision      Lymphoma  . Coronary angioplasty with stent placement    . Cholecystectomy    . Cataract extraction Bilateral    Family History  Problem Relation Age of Onset  . Coronary artery disease Father   . Coronary artery disease Mother   . Liver disease Mother   . Cancer Brother     esophagus  . Cancer Sister     breast   History  Substance Use Topics  . Smoking status: Current Every Day Smoker -- 0.50 packs/day for 23 years    Types: Cigarettes  . Smokeless tobacco: Never Used  . Alcohol Use: No   OB  History   Grav Para Term Preterm Abortions TAB SAB Ect Mult Living   3 3        3      Review of Systems  Constitutional: Positive for fever.  Respiratory: Positive for cough, chest tightness and shortness of breath.   All other systems reviewed and are negative.  Allergies  Florinef; Iohexol; Sulfonamide derivatives; and Tape  Home Medications   Prior to Admission medications   Medication Sig Start Date End Date Taking? Authorizing Provider  ALPRAZolam Duanne Moron) 0.5 MG tablet Take 0.5 mg by mouth 3 (three) times daily.    Yes Historical Provider, MD  amitriptyline (ELAVIL) 100 MG tablet Take 50 mg by mouth at bedtime.    Yes Historical Provider, MD  aspirin EC 81 MG tablet Take 81 mg by mouth at bedtime.    Yes Historical Provider, MD  atorvastatin (LIPITOR) 40 MG tablet Take 1 tablet (40 mg total) by mouth at bedtime. 04/21/13  Yes Lendon Colonel, NP  Carboxymethylcellul-Glycerin (OPTIVE SENSITIVE OP) Apply 1 drop to eye 2 (two) times daily.    Yes Historical Provider, MD  Cholecalciferol (VITAMIN D) 400  UNITS capsule Take 400 Units by mouth every evening.    Yes Historical Provider, MD  docusate sodium ([employer]S STOOL SOFTENER) 100 MG capsule Take 100 mg by mouth at bedtime.   Yes Historical Provider, MD  folic acid (FOLVITE) 1 MG tablet Take 1 mg by mouth every morning.    Yes Historical Provider, MD  HYDROcodone-acetaminophen (NORCO/VICODIN) 5-325 MG per tablet Take 1 tablet by mouth 3 (three) times daily.    Yes Historical Provider, MD  hydroxychloroquine (PLAQUENIL) 200 MG tablet Take 400 mg by mouth 2 (two) times daily.  02/07/13  Yes Historical Provider, MD  iron polysaccharides (NIFEREX) 150 MG capsule Take 150 mg by mouth every morning.    Yes Historical Provider, MD  KLOR-CON 10 10 MEQ tablet Take 10 mEq by mouth 2 (two) times daily.  03/15/13  Yes Historical Provider, MD  levothyroxine (SYNTHROID, LEVOTHROID) 25 MCG tablet Take 25 mcg by mouth every morning.    Yes Historical  Provider, MD  Multiple Vitamins-Minerals (PRESERVISION/LUTEIN) CAPS Take 1 capsule by mouth 2 (two) times daily.    Yes Historical Provider, MD  nitroGLYCERIN (NITROSTAT) 0.4 MG SL tablet Place 1 tablet (0.4 mg total) under the tongue every 5 (five) minutes as needed. 05/20/11  Yes Lendon Colonel, NP  omeprazole (PRILOSEC) 20 MG capsule Take 20 mg by mouth 2 (two) times daily.    Yes Historical Provider, MD  raloxifene (EVISTA) 60 MG tablet Take 60 mg by mouth every morning.    Yes Historical Provider, MD  RESTASIS 0.05 % ophthalmic emulsion Place 1 drop into both eyes 2 (two) times daily. 04/06/13  Yes Historical Provider, MD  sodium bicarbonate 325 MG tablet Take 650 mg by mouth 3 (three) times daily.    Yes Historical Provider, MD   Triage Vitals: BP 139/89  Pulse 87  Temp(Src) 98.1 F (36.7 C) (Oral)  Resp 16  Ht 5\' 10"  (1.778 m)  Wt 140 lb (63.504 kg)  BMI 20.09 kg/m2  SpO2 100% Physical Exam  Constitutional: She is oriented to person, place, and time. She appears well-developed and well-nourished. No distress.  HENT:  Head: Normocephalic and atraumatic.  Right Ear: Hearing normal.  Left Ear: Hearing normal.  Nose: Nose normal.  Mouth/Throat: Oropharynx is clear and moist and mucous membranes are normal.  Eyes: Conjunctivae and EOM are normal. Pupils are equal, round, and reactive to light.  Neck: Normal range of motion. Neck supple.  Cardiovascular: Normal rate, regular rhythm, S1 normal and S2 normal.  Exam reveals no gallop and no friction rub.   No murmur heard. Pulmonary/Chest: Effort normal and breath sounds normal. No respiratory distress. She exhibits no tenderness.  Abdominal: Soft. Normal appearance and bowel sounds are normal. There is no hepatosplenomegaly. There is no tenderness. There is no rebound, no guarding, no tenderness at McBurney's point and negative Murphy's sign. No hernia.  Musculoskeletal: Normal range of motion.  Neurological: She is alert and  oriented to person, place, and time. She has normal strength. No cranial nerve deficit or sensory deficit. Coordination normal. GCS eye subscore is 4. GCS verbal subscore is 5. GCS motor subscore is 6.  Skin: Skin is warm, dry and intact. No rash noted. No cyanosis.  Psychiatric: She has a normal mood and affect. Her speech is normal and behavior is normal. Thought content normal.    ED Course  Procedures (including critical care time) DIAGNOSTIC STUDIES: Oxygen Saturation is 100% on RA, normal by my interpretation.    COORDINATION OF  CARE: 4:48 PM Discussed treatment plan with pt at bedside and pt agreed to plan.  Labs Review Labs Reviewed  BASIC METABOLIC PANEL - Abnormal; Notable for the following:    Creatinine, Ser 1.51 (*)    GFR calc non Af Amer 34 (*)    GFR calc Af Amer 39 (*)    All other components within normal limits  CBC WITH DIFFERENTIAL - Abnormal; Notable for the following:    RBC 3.75 (*)    Hemoglobin 11.6 (*)    HCT 35.6 (*)    All other components within normal limits  PRO B NATRIURETIC PEPTIDE - Abnormal; Notable for the following:    Pro B Natriuretic peptide (BNP) 5385.0 (*)    All other components within normal limits  URINALYSIS, ROUTINE W REFLEX MICROSCOPIC - Abnormal; Notable for the following:    Protein, ur 30 (*)    Leukocytes, UA SMALL (*)    All other components within normal limits  URINE MICROSCOPIC-ADD ON - Abnormal; Notable for the following:    Bacteria, UA FEW (*)    All other components within normal limits  URINALYSIS, ROUTINE W REFLEX MICROSCOPIC  TROPONIN I  CBC WITH DIFFERENTIAL   Imaging Review Dg Chest 2 View  10/22/2013   CLINICAL DATA:  Shortness of breath, cough, weakness  EXAM: CHEST  2 VIEW  COMPARISON:  10/22/2013  FINDINGS: Chronic interstitial markings/emphysematous changes. No focal consolidation. No pleural effusion or pneumothorax.  The heart is normal in size.  Degenerative changes of the visualized thoracolumbar  spine.  Cholecystectomy clips.  IMPRESSION: No evidence of acute cardiopulmonary disease.   Electronically Signed   By: Julian Hy M.D.   On: 10/22/2013 18:05    EKG Interpretation   Date/Time:  Friday October 22 2013 16:35:42 EDT Ventricular Rate:  85 PR Interval:  178 QRS Duration: 104 QT Interval:  435 QTC Calculation: 517 R Axis:   76 Text Interpretation:  Sinus arrhythmia Ventricular bigeminy Abnormal  R-wave progression, early transition Probable inferior infarct, old  Probable anterolateral infarct, old Prolonged QT interval No significant  change since last tracing Confirmed by [facility]  MD, Sandy Haye [phone]) on  10/22/2013 9:51:49 PM      MDM   Final diagnoses:  CHF (congestive heart failure)   Patient will history of coronary artery disease, status post MI and stenting presents to the ER with a 2 or more week history of progressively worsening dyspnea on exertion. The patient indicates that she can only take a few steps now without getting very short of breath. Symptoms improve when she rest. She has not had chest pain associated with the symptoms. This is different than the symptoms she had when she had her heart attack. With that she had left arm pain nausea and vomiting.  Patient's EKG upon arrival did not have any changes from previous. Her troponin was negative. BNP was elevated at 5000, previous numbers were 100-500. This is a change for her. I did review the records and she did have an echo performed in 2011 that showed ejection fraction of 40-45%. She tells me that she has never been on any diuretics. I suspect that her dyspnea on exertion is related to congestive heart failure. She was given 40 mg of Lasix IV here in the ER. Anginal equivalent is also to be considered, we'll need to cycle enzymes. Patient will be hospitalized for further management.   I personally performed the services described in this documentation, which was scribed in my  presence. The  recorded information has been reviewed and is accurate.    Orpah Greek, MD 10/22/13 2351

## 2013-10-22 NOTE — H&P (Signed)
PCP:   Cleda Mccreedy, MD   Chief Complaint:  doe  HPI: 73 yo female h/o CAD, SLE, htn, hld, ckd comes in with 2 weeks of progressive worsening dyspnea on exertion and overall fatigue.  No chest pain. No swelling or edema anywhere.  No wt gain.  No fevers.  She has been having this dry cough, that is worse at night when she is lying down.  No prior history of chf.  No pnd, orthopnea.  Never been on lasix.  Last echo here in 2011 showed EF around 40%.  Review of Systems:  Positive and negative as per HPI otherwise all other systems are negative  Past Medical History: Past Medical History  Diagnosis Date  . Lymphoma   . Systemic lupus erythematosus   . Depression   . Hyperlipidemia   . 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 Sjogren's syndrome   . Orthostatic hypotension   . Dysphagia   . Syncope   . Tobacco abuse   . Weight loss   . Anemia   . Hypertension     hypotension  . Chronic anticoagulation 01/29/2012  . Chronic kidney disease   . 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 Thyroid disease   . Coronary artery disease     MI   Past Surgical History  Procedure Laterality Date  . Breast excisional biopsy      Left  . Eye surgery    . Mass excision      Lymphoma  . Coronary angioplasty with stent placement    . Cholecystectomy    . Cataract extraction Bilateral     Medications: Prior to Admission medications   Medication Sig Start Date End Date Taking? Authorizing Provider  ALPRAZolam Duanne Moron) 0.5 MG tablet Take 0.5 mg by mouth 3 (three) times daily.    Yes Historical Provider, MD  amitriptyline (ELAVIL) 100 MG tablet Take 50 mg by mouth at bedtime.    Yes Historical Provider, MD  aspirin EC 81 MG tablet Take 81 mg by mouth at bedtime.    Yes Historical Provider, MD  atorvastatin (LIPITOR) 40 MG tablet Take 1 tablet (40 mg total) by mouth at bedtime. 04/21/13  Yes Lendon Colonel, NP  Carboxymethylcellul-Glycerin (OPTIVE SENSITIVE OP) Apply 1 drop to eye 2 (two) times daily.    Yes Historical Provider, MD  Cholecalciferol (VITAMIN D) 400 UNITS capsule Take 400 Units by mouth every evening.    Yes Historical Provider, MD  docusate sodium ([employer]S STOOL SOFTENER) 100 MG capsule Take 100 mg by mouth at bedtime.   Yes Historical Provider, MD  folic acid (FOLVITE) 1 MG tablet Take 1 mg by mouth every morning.    Yes Historical Provider, MD  HYDROcodone-acetaminophen (NORCO/VICODIN) 5-325 MG per tablet Take 1 tablet by mouth 3 (three) times daily.    Yes Historical Provider, MD  hydroxychloroquine (PLAQUENIL) 200 MG tablet Take 400 mg by mouth 2 (two) times daily.  02/07/13  Yes Historical Provider, MD  iron polysaccharides (NIFEREX) 150 MG capsule Take 150 mg by mouth every morning.    Yes Historical Provider, MD  KLOR-CON 10 10 MEQ tablet Take 10 mEq by mouth 2 (two) times daily.  03/15/13  Yes Historical Provider, MD  levothyroxine (SYNTHROID, LEVOTHROID) 25 MCG tablet Take 25 mcg by mouth every morning.    Yes Historical Provider, MD  Multiple Vitamins-Minerals (PRESERVISION/LUTEIN) CAPS Take 1 capsule by mouth 2 (two) times daily.    Yes Historical Provider, MD  nitroGLYCERIN (NITROSTAT) 0.4 MG SL tablet Place 1 tablet (0.4 mg total) under the tongue every 5 (five) minutes as needed. 05/20/11  Yes Lendon Colonel, NP  omeprazole (PRILOSEC) 20 MG capsule Take 20 mg by mouth 2 (two) times daily.    Yes Historical Provider, MD  raloxifene (EVISTA) 60 MG tablet Take 60 mg by mouth every morning.    Yes Historical Provider, MD  RESTASIS 0.05 % ophthalmic emulsion Place 1 drop into both eyes 2 (two) times daily. 04/06/13  Yes Historical Provider, MD  sodium bicarbonate 325 MG tablet Take 650 mg by mouth 3 (three) times daily.    Yes Historical Provider, MD    Allergies:   Allergies  Allergen Reactions  . Florinef [Fludrocortisone Acetate]     Malaise: Discomfort   .  Iohexol Other (See Comments)    Pt. States that Dr. Florene Glen her kidney Dr. Does not want her to receive iv dye due to increased risk of kidney failure.   . Sulfonamide Derivatives Hives  . Tape Rash    ADHESIVE    Social History:  reports that she has been smoking Cigarettes.  She has a 11.5 pack-year smoking history. She has never used smokeless tobacco. She reports that she does not drink alcohol or use illicit drugs.  Family History: Family History  Problem Relation Age of Onset  . Coronary artery disease Father   . Coronary artery disease Mother   . Liver disease Mother   . Cancer Brother     esophagus  . Cancer Sister     breast    Physical Exam: Filed Vitals:   10/22/13 2143 10/22/13 2200 10/22/13 2300 10/22/13 2330  BP: 151/77 141/88 139/85 140/92  Pulse: 77 80 82 84  Temp:      TempSrc:      Resp: 19 17 17 14   Height:      Weight:      SpO2: 99% 100% 100% 98%   General appearance: alert, cooperative and no distress Head: Normocephalic, without obvious abnormality, atraumatic Eyes: negative Nose: Nares normal. Septum midline. Mucosa normal. No drainage or sinus tenderness. Neck: no JVD and supple, symmetrical, trachea midline Lungs: clear to auscultation bilaterally Heart: regular rate and rhythm, S1, S2 normal, no murmur, click, rub or gallop Abdomen: soft, non-tender; bowel sounds normal; no masses,  no organomegaly Extremities: extremities normal, atraumatic, no cyanosis or edema Pulses: 2+ and symmetric Skin: Skin color, texture, turgor normal. No rashes or lesions Neurologic: Grossly normal    Labs on Admission:   Recent Labs  10/22/13 1808  NA 138  K 4.2  CL 103  CO2 21  GLUCOSE 77  BUN 23  CREATININE 1.51*  CALCIUM 9.1    Recent Labs  10/22/13 1808  WBC 5.8  NEUTROABS 3.7  HGB 11.6*  HCT 35.6*  MCV 94.9  PLT 201    Recent Labs  10/22/13 1808  TROPONINI <0.30   Radiological Exams on Admission: Dg Chest 2 View  10/22/2013    CLINICAL DATA:  Shortness of breath, cough, weakness  EXAM: CHEST  2 VIEW  COMPARISON:  10/22/2013  FINDINGS: Chronic interstitial markings/emphysematous changes. No focal consolidation. No pleural effusion or pneumothorax.  The heart is normal in size.  Degenerative changes of the visualized thoracolumbar spine.  Cholecystectomy clips.  IMPRESSION: No evidence of acute cardiopulmonary disease.   Electronically Signed  By: Julian Hy M.D.   On: 10/22/2013 18:05    Assessment/Plan  73 yo female with DOE with h/o CAD and diminished systolic function (9741 EF 40%) with probable mild new onset chf  Principal Problem:   CHF, acute-  Will need repeat echo at some point, it has been ordered.  Will serial enzymes as this could be anginal equivalent.  ekg no acute issues.  obs on tele.  Lasix 40mg  iv daily but probably will need less dose than this as outpt.  chf pathway.  Active Problems:  Stable unless o/w noted   Sjogren's syndrome   Arteriosclerotic cardiovascular disease (ASCVD)   Chronic kidney disease   Systemic lupus erythematosus   Coronary artery disease   DOE (dyspnea on exertion)  obs on tele.  Full code.  Nicky Kras A 10/22/2013, 11:50 PM

## 2013-10-23 DIAGNOSIS — I509 Heart failure, unspecified: Secondary | ICD-10-CM | POA: Diagnosis not present

## 2013-10-23 LAB — TROPONIN I
Troponin I: <0.3 ng/mL (ref <0.30)
Troponin I: <0.3 ng/mL (ref <0.30)
Troponin I: <0.3 ng/mL (ref <0.30)

## 2013-10-23 MED ORDER — ATORVASTATIN CALCIUM 40 MG PO TABS
40.0000 mg | ORAL_TABLET | Freq: Every day | ORAL | Status: DC
Start: 2013-10-23 — End: 2013-10-24
  Administered 2013-10-23 (×2): 40 mg via ORAL
  Filled 2013-10-23 (×2): qty 1

## 2013-10-23 MED ORDER — AMITRIPTYLINE HCL 25 MG PO TABS
50.0000 mg | ORAL_TABLET | Freq: Every day | ORAL | Status: DC
  Administered 2013-10-23 (×2): 50 mg via ORAL
  Filled 2013-10-23 (×3): qty 2

## 2013-10-23 MED ORDER — CYCLOSPORINE 0.05 % OP EMUL
1.0000 [drp] | Freq: Two times a day (BID) | OPHTHALMIC | Status: DC
  Administered 2013-10-23 – 2013-10-24 (×4): 1 [drp] via OPHTHALMIC
  Filled 2013-10-23 (×6): qty 1

## 2013-10-23 MED ORDER — FOLIC ACID 1 MG PO TABS
1.0000 mg | ORAL_TABLET | Freq: Every morning | ORAL | Status: DC
  Administered 2013-10-23 – 2013-10-24 (×2): 1 mg via ORAL
  Filled 2013-10-23 (×2): qty 1

## 2013-10-23 MED ORDER — SODIUM CHLORIDE 0.9 % IV SOLN
250.0000 mL | INTRAVENOUS | Status: DC | PRN
Start: 2013-10-23 — End: 2013-10-24

## 2013-10-23 MED ORDER — ASPIRIN EC 81 MG PO TBEC
81.0000 mg | DELAYED_RELEASE_TABLET | Freq: Every day | ORAL | Status: DC
Start: 2013-10-23 — End: 2013-10-24
  Administered 2013-10-23 (×2): 81 mg via ORAL
  Filled 2013-10-23 (×2): qty 1

## 2013-10-23 MED ORDER — ALPRAZOLAM 0.5 MG PO TABS
0.5000 mg | ORAL_TABLET | Freq: Three times a day (TID) | ORAL | Status: DC
  Administered 2013-10-23 – 2013-10-24 (×5): 0.5 mg via ORAL
  Filled 2013-10-23 (×5): qty 1

## 2013-10-23 MED ORDER — SODIUM CHLORIDE 0.9 % IJ SOLN
3.0000 mL | Freq: Two times a day (BID) | INTRAMUSCULAR | Status: DC
  Administered 2013-10-23 – 2013-10-24 (×4): 3 mL via INTRAVENOUS

## 2013-10-23 MED ORDER — FUROSEMIDE 10 MG/ML IJ SOLN
40.0000 mg | Freq: Every day | INTRAMUSCULAR | Status: DC
  Administered 2013-10-23: 40 mg via INTRAVENOUS
  Filled 2013-10-23: qty 4

## 2013-10-23 MED ORDER — ENOXAPARIN SODIUM 40 MG/0.4ML ~~LOC~~ SOLN
40.0000 mg | SUBCUTANEOUS | Status: DC
  Administered 2013-10-23 – 2013-10-24 (×2): 40 mg via SUBCUTANEOUS
  Filled 2013-10-23 (×2): qty 0.4

## 2013-10-23 MED ORDER — POLYSACCHARIDE IRON COMPLEX 150 MG PO CAPS
150.0000 mg | ORAL_CAPSULE | Freq: Every morning | ORAL | Status: DC
  Administered 2013-10-23 – 2013-10-24 (×2): 150 mg via ORAL
  Filled 2013-10-23 (×2): qty 1

## 2013-10-23 MED ORDER — HYDROCODONE-ACETAMINOPHEN 5-325 MG PO TABS
1.0000 | ORAL_TABLET | Freq: Three times a day (TID) | ORAL | Status: DC
  Administered 2013-10-23 (×2): 1 via ORAL
  Filled 2013-10-23 (×2): qty 1

## 2013-10-23 MED ORDER — HYDROXYCHLOROQUINE SULFATE 200 MG PO TABS
400.0000 mg | ORAL_TABLET | Freq: Two times a day (BID) | ORAL | Status: DC
  Administered 2013-10-23 – 2013-10-24 (×4): 400 mg via ORAL
  Filled 2013-10-23 (×6): qty 2

## 2013-10-23 MED ORDER — SODIUM CHLORIDE 0.9 % IJ SOLN
3.0000 mL | INTRAMUSCULAR | Status: DC | PRN
Start: 2013-10-23 — End: 2013-10-24

## 2013-10-23 MED ORDER — HYDROXYCHLOROQUINE SULFATE 200 MG PO TABS
ORAL_TABLET | ORAL | Status: AC
  Filled 2013-10-23: qty 2

## 2013-10-23 MED ORDER — POTASSIUM CHLORIDE CRYS ER 10 MEQ PO TBCR
10.0000 meq | EXTENDED_RELEASE_TABLET | Freq: Two times a day (BID) | ORAL | Status: DC
  Administered 2013-10-23 – 2013-10-24 (×4): 10 meq via ORAL
  Filled 2013-10-23 (×9): qty 1

## 2013-10-23 MED ORDER — DOCUSATE SODIUM 100 MG PO CAPS
100.0000 mg | ORAL_CAPSULE | Freq: Every day | ORAL | Status: DC
  Administered 2013-10-23 (×2): 100 mg via ORAL
  Filled 2013-10-23 (×2): qty 1

## 2013-10-23 MED ORDER — LEVOTHYROXINE SODIUM 25 MCG PO TABS
25.0000 ug | ORAL_TABLET | Freq: Every morning | ORAL | Status: DC
  Administered 2013-10-23 – 2013-10-24 (×2): 25 ug via ORAL
  Filled 2013-10-23 (×2): qty 1

## 2013-10-23 MED ORDER — RALOXIFENE HCL 60 MG PO TABS
60.0000 mg | ORAL_TABLET | Freq: Every morning | ORAL | Status: DC
  Administered 2013-10-23 – 2013-10-24 (×2): 60 mg via ORAL
  Filled 2013-10-23 (×2): qty 1

## 2013-10-23 NOTE — Discharge Planning (Signed)
Ambulated in hall in RA (about 540ft) - pt remained in mid-upper 90s.  However, pt uses cane at home and was unable to walk safely without gaitbelt.  Pt also on tele and became tachy.  Pt needing to have echo today and doctor has decided to keep continue to observe with [facility] for 6/14

## 2013-10-23 NOTE — Progress Notes (Signed)
Note: This document was prepared with digital dictation and possible smart phrase technology. Any transcriptional errors that result from this process are unintentional.   Tiffany [name] WIO:[id number] DOB: 1940/08/01 DOA: 10/22/2013 PCP: Cleda Mccreedy, MD  Brief narrative: 73 y/o ? known history of  Medium probability pulmonary embolism 08/2011 completing 6 months of anticoagulation, orthostatic hypotension, CAD S. S/P stent Taxus DES to RCA 1/26/6-unstable angina 10/2005 with normal cardiac catheterization and patent stents right coronary, esophagus web  s/p cavitation plus erosive esophagitis 03/2007/melanosis coli,  admitted 10/22/13 with increasing dyspnea overall fatigue with dry cough. Chest x-ray showed no cardiopulmonary disease but admitted as CHF  Past medical history-As per Problem list Chart reviewed as below-   Consultants:  None  Procedures:  Chest x-ray two-view  Echocardiogram pending  Antibiotics:  None   Subjective  He him in him  Pleasant alert oriented no apparent distress No shortness of breath Eating and drinking fairly States that shortness of breath occurred when she cut back on her mother cigarettes and has been going on for about 2 weeks without lower extremity swelling Increase in weight gain No nausea no vomiting Has been feeling dizzy and light headed as well States that chest pain occurred about 2 days ago but has not recurred Overall not a very clear historian    Objective    Interim History: None  Telemetry: Sinus tachycardia with ambulation   Objective: Filed Vitals:   10/23/13 0045 10/23/13 0143 10/23/13 0157 10/23/13 0505  BP: 131/75   110/69  Pulse: 88  76 72  Temp: 98.3 F (36.8 C)   97.7 F (36.5 C)  TempSrc: Oral   Oral  Resp: 14  16 16   Height:      Weight:  59.739 kg (131 lb 11.2 oz)    SpO2: 97%  98% 97%    Intake/Output Summary (Last 24 hours) at 10/23/13 0941 Last data filed at 10/23/13 0600  Gross per 24  hour  Intake    240 ml  Output      0 ml  Net    240 ml    Exam:  General: eomi, ncat Cardiovascular: s1 s2 no m/r/g Respiratory: clear, no added sound Abdomen:  Soft, NT, ND Skin no LE edema Neuro Intact, moves all 4 limbs equally  Data Reviewed: Basic Metabolic Panel:  Recent Labs Lab 10/22/13 1808  NA 138  K 4.2  CL 103  CO2 21  GLUCOSE 77  BUN 23  CREATININE 1.51*  CALCIUM 9.1   Liver Function Tests: No results found for this basename: AST, ALT, ALKPHOS, BILITOT, PROT, ALBUMIN,  in the last 168 hours No results found for this basename: LIPASE, AMYLASE,  in the last 168 hours No results found for this basename: AMMONIA,  in the last 168 hours CBC:  Recent Labs Lab 10/22/13 1808  WBC 5.8  NEUTROABS 3.7  HGB 11.6*  HCT 35.6*  MCV 94.9  PLT 201   Cardiac Enzymes:  Recent Labs Lab 10/22/13 1808 10/23/13 0234 10/23/13 0616  TROPONINI <0.30 <0.30 <0.30   BNP: No components found with this basename: POCBNP,  CBG: No results found for this basename: GLUCAP,  in the last 168 hours  No results found for this or any previous visit (from the past 240 hour(s)).   Studies:              All Imaging reviewed and is as per above notation   Scheduled Meds: . ALPRAZolam  0.5 mg  Oral TID  . amitriptyline  50 mg Oral QHS  . aspirin EC  81 mg Oral QHS  . atorvastatin  40 mg Oral QHS  . cycloSPORINE  1 drop Both Eyes BID  . docusate sodium  100 mg Oral QHS  . folic acid  1 mg Oral q morning - 10a  . furosemide  40 mg Intravenous Daily  . HYDROcodone-acetaminophen  1 tablet Oral TID  . hydroxychloroquine  400 mg Oral BID  . iron polysaccharides  150 mg Oral q morning - 10a  . levothyroxine  25 mcg Oral q morning - 10a  . potassium chloride  10 mEq Oral BID  . raloxifene  60 mg Oral q morning - 10a  . sodium chloride  3 mL Intravenous Q12H   Continuous Infusions:    Assessment/Plan:  1. Dyspnea/dizzyness-unclear etiology.  CXR not confirming CHF or  PNA.  History wise been trying to cut back on Cigarettes-Could also be COPD/bronchitis.  Low suspicion with NSR and no current CP for recurrent PE.  If recurs could consider V/Q/CT Angio.  ECHO is pending. BNP 5385 but no notable JVD 2. CAD h/o with stent DES-->RCA 2006-stbale at present.  Echo pending-presumably not on BB/ACe 2/2 to Orthostasis.  COnt Asa 81 qhs 3. Orthostatic hypotension-hold IV lasix for now as unclear etiology as in #1. Reassess in am 4. sjogren's disease and SLE-continue Hydroxychloroquine 400 bid-Needs OP f/u Rheumatology.  COnt Cyclosporine 1 gtt od/os. 5. Anemia, Iron def-continue Iron daily 741 mg q am, Folic acid 1 mg q am-consider OP colonoscopy if not done recently 6. Consitpation-continue Colace 100 mg qhs 7. Hypothyroid-continue Levothyroxine 25 mcg daily 8. Anxiety-Continue Xanax 0.5 tid, amitryptylline 50mg  qhs 9. HLd-continue Atorvastatin 40 mg qhs   Code Status: full Family Communication: none bedside Disposition Plan: inpatient tele-monitr and await echo/ PT Ot eval   Verneita Griffes, MD  [facility] Pager [phone] 10/23/2013, 9:41 AM    LOS: 1 day

## 2013-10-23 NOTE — Progress Notes (Signed)
  Echocardiogram 2D Echocardiogram has been performed.  Doyle Askew 10/23/2013, 3:04 PM

## 2013-10-24 ENCOUNTER — Observation Stay (HOSPITAL_COMMUNITY): Payer: PRIVATE HEALTH INSURANCE

## 2013-10-24 DIAGNOSIS — I509 Heart failure, unspecified: Secondary | ICD-10-CM | POA: Diagnosis not present

## 2013-10-24 LAB — BASIC METABOLIC PANEL
BUN: 26 mg/dL — AB (ref 6–23)
CHLORIDE: 104 meq/L (ref 96–112)
CO2: 20 mEq/L (ref 19–32)
CREATININE: 1.6 mg/dL — AB (ref 0.50–1.10)
Calcium: 9 mg/dL (ref 8.4–10.5)
GFR calc Af Amer: 36 mL/min — ABNORMAL LOW (ref >90)
GFR calc non Af Amer: 31 mL/min — ABNORMAL LOW (ref >90)
Glucose, Bld: 97 mg/dL (ref 70–99)
Potassium: 4 mEq/L (ref 3.7–5.3)
Sodium: 137 mEq/L (ref 137–147)

## 2013-10-24 MED ORDER — FUROSEMIDE 20 MG PO TABS: 20.0000 mg | ORAL_TABLET | ORAL | Status: DC

## 2013-10-24 MED ORDER — POLYETHYLENE GLYCOL 3350 17 G PO PACK
17.0000 g | PACK | Freq: Every day | ORAL | Status: DC
  Administered 2013-10-24: 17 g via ORAL
  Filled 2013-10-24: qty 1

## 2013-10-24 MED ORDER — ALBUTEROL SULFATE HFA 108 (90 BASE) MCG/ACT IN AERS: 2.0000 | INHALATION_SPRAY | Freq: Four times a day (QID) | RESPIRATORY_TRACT | Status: DC | PRN

## 2013-10-24 NOTE — Discharge Planning (Addendum)
Pt stated she was ready to be DCd and pain was under control.  Pt's IV and tele were removed and pt also given DC paper with suggestions for follow with PCP.  Explained that scripts were sent to pharm and refills should be ordered through PCP if needed.  Pt also given Education on s/sx of future CHF or COPD problems and when to call doctor or return to the hospital.  Pt will be wheeled to car when ride arrives - probably about 2pm.

## 2013-10-24 NOTE — Discharge Summary (Signed)
Physician Discharge Summary  Tiffany [name] [id number] DOB: 06-03-1940 DOA: 10/22/2013  PCP: Cleda Mccreedy, MD  Admit date: 10/22/2013 Discharge date: 10/24/2013  Time spent: 35 minutes  Recommendations for Outpatient Follow-up:  1. Continue albuterol and Lasix every other day when necessary for shortness of breath symptomatology 2. If persisting will get nonemergent CT of chest-low probability at present time to consider this  3. Followup closely with primary care physician  Discharge Diagnoses:  Principal Problem:   CHF, acute Active Problems:   Sjogren's syndrome   Arteriosclerotic cardiovascular disease (ASCVD)   Chronic kidney disease   Systemic lupus erythematosus   Coronary artery disease   DOE (dyspnea on exertion)   Discharge Condition: Good  Diet recommendation: Stable  Filed Weights   10/22/13 1638 10/23/13 0143 10/24/13 0536  Weight: 63.504 kg (140 lb) 59.739 kg (131 lb 11.2 oz) 62.6 kg (138 lb 0.1 oz)    History of present illness:  73 y/o ? known history of Medium probability pulmonary embolism 08/2011 completing 6 months of anticoagulation, orthostatic hypotension, CAD S. S/P stent Taxus DES to RCA 1/26/6-unstable angina 10/2005 with normal cardiac catheterization and patent stents right coronary, esophagus web s/p cavitation plus erosive esophagitis 03/2007/melanosis coli, admitted 10/22/13 with increasing dyspnea overall fatigue with dry cough.  Chest x-ray showed no cardiopulmonary disease but admitted as CHF Because of her symptoms she was given initially some IV Lasix but this was held because she was not volume overloaded on examination of JVD. It was not felt that she had risk factor at the present time for pulmonary embolism as she was normal sinus rhythm had no chest pain echocardiogram also this admission showed only grade 1 diastolic dysfunction EF mild coronal images 40 45%. Repeat chest x-ray 6/14 showed maybe emphysema and it was thought that she'll  benefit from when necessary inhalers as well and close followup as well as smoking cessation On 10/24/13 patient was felt to have maximally benefited from hospital stay and was discharged at a stable state  Discharge Exam: Filed Vitals:   10/24/13 0536  BP: 101/65  Pulse: 78  Temp: 97.9 F (36.6 C)  Resp: 16    General: Alert pleasant oriented Cardiovascular: S1-S2 no murmur rub or gallop Respiratory: Clinically clear no evidence of  Discharge Instructions You were cared for by a hospitalist during your hospital stay. If you have any questions about your discharge medications or the care you received while you were in the hospital after you are discharged, you can call the unit and asked to speak with the hospitalist on call if the hospitalist that took care of you is not available. Once you are discharged, your primary care physician will handle any further medical issues. Please note that NO REFILLS for any discharge medications will be authorized once you are discharged, as it is imperative that you return to your primary care physician (or establish a relationship with a primary care physician if you do not have one) for your aftercare needs so that they can reassess your need for medications and monitor your lab values.  Discharge Instructions   Diet - low sodium heart healthy    Complete by:  As directed      Discharge instructions    Complete by:  As directed   You were admitted for SOb. I think this was likely just Bronchitis as you had just cut back a lot on smoking.  congrats I will Prescribe you a fluid medicine called Lasix 20 mg take  every other day if you have a little bit of lower extremity swelling and this can be discontinued if your regular doctor thinks he don't needed I will also prescribe you an inhaler with a spacer for you to use if you feel like you are wheezy or short of breath. Is recommended to closely follow with your regular doctor cultures are sometimes  rheumatological disease can cause lung problems as well     Increase activity slowly    Complete by:  As directed             Medication List         albuterol 108 (90 BASE) MCG/ACT inhaler  Commonly known as:  PROVENTIL HFA;VENTOLIN HFA  Inhale 2 puffs into the lungs every 6 (six) hours as needed for wheezing or shortness of breath.     ALPRAZolam 0.5 MG tablet  Commonly known as:  XANAX  Take 0.5 mg by mouth 3 (three) times daily.     amitriptyline 100 MG tablet  Commonly known as:  ELAVIL  Take 50 mg by mouth at bedtime.     aspirin EC 81 MG tablet  Take 81 mg by mouth at bedtime.     atorvastatin 40 MG tablet  Commonly known as:  LIPITOR  Take 1 tablet (40 mg total) by mouth at bedtime.     [employer] STOOL SOFTENER 100 MG capsule  Generic drug:  docusate sodium  Take 100 mg by mouth at bedtime.     folic acid 1 MG tablet  Commonly known as:  FOLVITE  Take 1 mg by mouth every morning.     furosemide 20 MG tablet  Commonly known as:  LASIX  Take 1 tablet (20 mg total) by mouth every other day.     HYDROcodone-acetaminophen 5-325 MG per tablet  Commonly known as:  NORCO/VICODIN  Take 1 tablet by mouth 3 (three) times daily.     hydroxychloroquine 200 MG tablet  Commonly known as:  PLAQUENIL  Take 400 mg by mouth 2 (two) times daily.     iron polysaccharides 150 MG capsule  Commonly known as:  NIFEREX  Take 150 mg by mouth every morning.     KLOR-CON 10 10 MEQ tablet  Generic drug:  potassium chloride  Take 10 mEq by mouth 2 (two) times daily.     levothyroxine 25 MCG tablet  Commonly known as:  SYNTHROID, LEVOTHROID  Take 25 mcg by mouth every morning.     nitroGLYCERIN 0.4 MG SL tablet  Commonly known as:  NITROSTAT  Place 1 tablet (0.4 mg total) under the tongue every 5 (five) minutes as needed.     omeprazole 20 MG capsule  Commonly known as:  PRILOSEC  Take 20 mg by mouth 2 (two) times daily.     OPTIVE SENSITIVE OP  Apply 1 drop to eye 2 (two)  times daily.     PRESERVISION/LUTEIN Caps  Take 1 capsule by mouth 2 (two) times daily.     raloxifene 60 MG tablet  Commonly known as:  EVISTA  Take 60 mg by mouth every morning.     RESTASIS 0.05 % ophthalmic emulsion  Generic drug:  cycloSPORINE  Place 1 drop into both eyes 2 (two) times daily.     sodium bicarbonate 325 MG tablet  Take 650 mg by mouth 3 (three) times daily.     Vitamin D 400 UNITS capsule  Take 400 Units by mouth every evening.  Allergies  Allergen Reactions  . Florinef [Fludrocortisone Acetate]     Malaise: Discomfort   . Iohexol Other (See Comments)    Pt. States that Dr. Florene Glen her kidney Dr. Does not want her to receive iv dye due to increased risk of kidney failure.   . Sulfonamide Derivatives Hives  . Tape Rash    ADHESIVE      The results of significant diagnostics from this hospitalization (including imaging, microbiology, ancillary and laboratory) are listed below for reference.    Significant Diagnostic Studies: Dg Chest 2 View  10/24/2013   CLINICAL DATA:  Cough next  EXAM: CHEST  2 VIEW  COMPARISON:  10/22/2013  FINDINGS: Heart size is normal. No pleural effusion or edema. No airspace consolidation identified. The lungs are hyperinflated and there are coarsened interstitial markings identified bilaterally. Spondylosis noted within the thoracic spine.  IMPRESSION: 1. No acute findings. 2. Suspect emphysema.   Electronically Signed   By: Kerby Moors M.D.   On: 10/24/2013 09:19   Dg Chest 2 View  10/22/2013   CLINICAL DATA:  Shortness of breath, cough, weakness  EXAM: CHEST  2 VIEW  COMPARISON:  10/22/2013  FINDINGS: Chronic interstitial markings/emphysematous changes. No focal consolidation. No pleural effusion or pneumothorax.  The heart is normal in size.  Degenerative changes of the visualized thoracolumbar spine.  Cholecystectomy clips.  IMPRESSION: No evidence of acute cardiopulmonary disease.   Electronically Signed   By: Julian Hy M.D.   On: 10/22/2013 18:05    Microbiology: No results found for this or any previous visit (from the past 240 hour(s)).   Labs: Basic Metabolic Panel:  Recent Labs Lab 10/22/13 1808 10/24/13 0615  NA 138 137  K 4.2 4.0  CL 103 104  CO2 21 20  GLUCOSE 77 97  BUN 23 26*  CREATININE 1.51* 1.60*  CALCIUM 9.1 9.0   Liver Function Tests: No results found for this basename: AST, ALT, ALKPHOS, BILITOT, PROT, ALBUMIN,  in the last 168 hours No results found for this basename: LIPASE, AMYLASE,  in the last 168 hours No results found for this basename: AMMONIA,  in the last 168 hours CBC:  Recent Labs Lab 10/22/13 1808  WBC 5.8  NEUTROABS 3.7  HGB 11.6*  HCT 35.6*  MCV 94.9  PLT 201   Cardiac Enzymes:  Recent Labs Lab 10/22/13 1808 10/23/13 0234 10/23/13 0616 10/23/13 1316  TROPONINI <0.30 <0.30 <0.30 <0.30   BNP: BNP (last 3 results)  Recent Labs  10/22/13 1808  PROBNP 5385.0*   CBG: No results found for this basename: GLUCAP,  in the last 168 hours     Signed:  Nita Sells  [facility] 10/24/2013, 11:24 AM

## 2013-10-24 NOTE — Progress Notes (Signed)
UR Completed.  Cooper Moroney Jane [phone] 10/24/2013  

## 2013-11-02 ENCOUNTER — Encounter: Payer: Self-pay | Admitting: Adult Health

## 2013-11-02 ENCOUNTER — Ambulatory Visit (INDEPENDENT_AMBULATORY_CARE_PROVIDER_SITE_OTHER): Payer: PRIVATE HEALTH INSURANCE | Admitting: Adult Health

## 2013-11-02 VITALS — BP 124/64 | HR 64 | Ht 70.0 in | Wt 138.0 lb

## 2013-11-02 DIAGNOSIS — I10 Essential (primary) hypertension: Secondary | ICD-10-CM

## 2013-11-02 DIAGNOSIS — I25118 Atherosclerotic heart disease of native coronary artery with other forms of angina pectoris: Secondary | ICD-10-CM

## 2013-11-02 DIAGNOSIS — I509 Heart failure, unspecified: Secondary | ICD-10-CM

## 2013-11-02 DIAGNOSIS — R0609 Other forms of dyspnea: Secondary | ICD-10-CM

## 2013-11-02 DIAGNOSIS — I2510 Atherosclerotic heart disease of native coronary artery without angina pectoris: Secondary | ICD-10-CM

## 2013-11-02 DIAGNOSIS — I209 Angina pectoris, unspecified: Secondary | ICD-10-CM

## 2013-11-02 DIAGNOSIS — R0602 Shortness of breath: Secondary | ICD-10-CM

## 2013-11-02 DIAGNOSIS — R0989 Other specified symptoms and signs involving the circulatory and respiratory systems: Secondary | ICD-10-CM

## 2013-11-02 DIAGNOSIS — R079 Chest pain, unspecified: Secondary | ICD-10-CM

## 2013-11-02 DIAGNOSIS — I5032 Chronic diastolic (congestive) heart failure: Secondary | ICD-10-CM

## 2013-11-02 NOTE — Assessment & Plan Note (Signed)
She remained stable. She will remain on aspirin statin. She is not on a beta blocker in the setting of emphysema and bronchospasms associated. Blood pressure is currently well-controlled.

## 2013-11-02 NOTE — Assessment & Plan Note (Signed)
Completely resolved. She has had no recurrence with or without exertion.

## 2013-11-02 NOTE — Assessment & Plan Note (Signed)
This is chronic for her in the setting of emphysema. She is not very active. States it is not a lot during the summer and winter. She will continue bronchodilators as directed, and followup with primary care as needed.

## 2013-11-02 NOTE — Assessment & Plan Note (Signed)
Blood pressure is currently very well-controlled. She has occasional dizziness when she stands or goes from a lying position to a standing position. She is taking her time when getting up. I reviewed her echo results revealing an EF of 45 percent. She is on Lasix every other day. I have asked her to keep track of her blood pressure, if her blood pressure decreases to less than 7:51 systolic she is to hold her Lasix that day. I will repeat her echocardiogram in a few months to evaluate LV function improvement. We may be able to get her off the Lasix altogether. Followup BMET will be completed in 3 months as well to evaluate for kidney function and sodium.

## 2013-11-02 NOTE — Progress Notes (Signed)
HPI: Tiffany [name] is a 73 year old patient to be est. with Dr. Pennelope Bracken or Dr. Harl Bowie, we are following for ongoing assessment and management of CAD status post Taxus drug-eluting stent to the right coronary artery, with most recent cardiac catheterization in 2007 with patent stents,, frequent ventricular arrhythmias which were found to be non-life-threatening, hypercholesterolemia, with history of chronic kidney disease and anemia.  She was recently admitted to [facility] in the setting of acute CHF,  echocardiogram revealing mild reduction in EF of 40-45%. This found she was not having CHF decompensation but emphysema symptoms. She was treated with broncho-inhalers, and counseled on smoking cessation.  The patient unfortunately continues to smoke. She is tolerating her medications without significant symptoms. It is noted that her only symptom now is fatigue and occasional dizziness. Her Lasix is down every other day. Review labs on discharge demonstrated her sodium was 137 with a potassium of 4.0. She denies recurrent chest pain nausea or chest pressure.  Allergies  Allergen Reactions  . Florinef [Fludrocortisone Acetate]     Malaise: Discomfort   . Iohexol Other (See Comments)    Pt. States that Dr. Florene Glen her kidney Dr. Does not want her to receive iv dye due to increased risk of kidney failure.   . Sulfonamide Derivatives Hives  . Tape Rash    ADHESIVE    Current Outpatient Prescriptions  Medication Sig Dispense Refill  . albuterol (PROVENTIL HFA;VENTOLIN HFA) 108 (90 BASE) MCG/ACT inhaler Inhale 2 puffs into the lungs every 6 (six) hours as needed for wheezing or shortness of breath.  1 Inhaler  2  . ALPRAZolam (XANAX) 0.5 MG tablet Take 0.5 mg by mouth 3 (three) times daily.       Marland Kitchen amitriptyline (ELAVIL) 100 MG tablet Take 50 mg by mouth at bedtime.       Marland Kitchen aspirin EC 81 MG tablet Take 81 mg by mouth at bedtime.       Marland Kitchen atorvastatin (LIPITOR) 40 MG tablet Take 1  tablet (40 mg total) by mouth at bedtime.  30 tablet  6  . Carboxymethylcellul-Glycerin (OPTIVE SENSITIVE OP) Apply 1 drop to eye 2 (two) times daily.       . Cholecalciferol (VITAMIN D) 400 UNITS capsule Take 400 Units by mouth every evening.       . docusate sodium ([employer]S STOOL SOFTENER) 100 MG capsule Take 100 mg by mouth at bedtime.      . folic acid (FOLVITE) 1 MG tablet Take 1 mg by mouth every morning.       . furosemide (LASIX) 20 MG tablet Take 1 tablet (20 mg total) by mouth every other day.  30 tablet  0  . HYDROcodone-acetaminophen (NORCO/VICODIN) 5-325 MG per tablet Take 1 tablet by mouth 3 (three) times daily.       . hydroxychloroquine (PLAQUENIL) 200 MG tablet Take 400 mg by mouth 2 (two) times daily.       . iron polysaccharides (NIFEREX) 150 MG capsule Take 150 mg by mouth every morning.       Marland Kitchen KLOR-CON 10 10 MEQ tablet Take 10 mEq by mouth 2 (two) times daily.       Marland Kitchen levothyroxine (SYNTHROID, LEVOTHROID) 25 MCG tablet Take 25 mcg by mouth every morning.       . Multiple Vitamins-Minerals (PRESERVISION/LUTEIN) CAPS Take 1 capsule by mouth 2 (two) times daily.       . nitroGLYCERIN (NITROSTAT) 0.4 MG SL tablet Place 1 tablet (0.4 mg  total) under the tongue every 5 (five) minutes as needed.  100 tablet  6  . omeprazole (PRILOSEC) 20 MG capsule Take 20 mg by mouth 2 (two) times daily.       . raloxifene (EVISTA) 60 MG tablet Take 60 mg by mouth every morning.       . RESTASIS 0.05 % ophthalmic emulsion Place 1 drop into both eyes 2 (two) times daily.      . sodium bicarbonate 325 MG tablet Take 650 mg by mouth 3 (three) times daily.        No current facility-administered medications for this visit.    Past Medical History  Diagnosis Date  . Lymphoma   . Systemic lupus erythematosus   . Depression   . Hyperlipidemia   . 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 Sjogren's syndrome   .  Orthostatic hypotension   . Dysphagia   . Syncope   . Tobacco abuse   . Weight loss   . Anemia   . Hypertension     hypotension  . Chronic anticoagulation 01/29/2012  . Chronic kidney disease   . 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 Thyroid disease   . Coronary artery disease     MI    Past Surgical History  Procedure Laterality Date  . Breast excisional biopsy      Left  . Eye surgery    . Mass excision      Lymphoma  . Coronary angioplasty with stent placement    . Cholecystectomy    . Cataract extraction Bilateral     ROS: Review of systems complete and found to be negative unless listed above  PHYSICAL EXAM BP 124/64  Pulse 64  Ht 5\' 10"  (1.778 m)  Wt 138 lb (62.596 kg)  BMI 19.80 kg/m2 General: Well developed, well nourished, in no acute distress Head: Eyes PERRLA, No xanthomas.   Normal cephalic and atramatic  Lungs: Clear bilaterally to auscultation and percussion. Heart: HRRR S1 S2, without MRG.  Pulses are 2+ & equal.            No carotid bruit. No JVD.  No abdominal bruits. No femoral bruits. Abdomen: Bowel sounds are positive, abdomen soft and non-tender without masses or                  Hernia's noted. Msk:  Back normal, normal gait. Normal strength and tone for age. Extremities: No clubbing, cyanosis or edema.  DP +1 Neuro: Alert and oriented X 3. Psych:  Good affect, responds appropriately     ASSESSMENT AND PLAN

## 2013-11-02 NOTE — Progress Notes (Deleted)
Name: Tiffany [name]    DOB: 1940-11-24  Age: 73 y.o.  MR#: [id number]       PCP:  Cleda Mccreedy, MD      Insurance: Payor: Onnie Boer MEDICARE / Plan: Cottie Banda / Product Type: *No Product type* /   CC:    Chief Complaint  Patient presents with  . Coronary Artery Disease  . Palpitations    VS Filed Vitals:   11/02/13 1300  BP: 124/64  Pulse: 64  Height: 5\' 10"  (1.778 m)  Weight: 138 lb (62.596 kg)    Weights Current Weight  11/02/13 138 lb (62.596 kg)  10/24/13 138 lb 0.1 oz (62.6 kg)  04/21/13 146 lb (66.225 kg)    Blood Pressure  BP Readings from Last 3 Encounters:  11/02/13 124/64  10/24/13 101/65  04/21/13 142/85     Admit date:  (Not on file) Last encounter with RMR:  Visit date not found   Allergy Florinef; Iohexol; Sulfonamide derivatives; and Tape  Current Outpatient Prescriptions  Medication Sig Dispense Refill  . albuterol (PROVENTIL HFA;VENTOLIN HFA) 108 (90 BASE) MCG/ACT inhaler Inhale 2 puffs into the lungs every 6 (six) hours as needed for wheezing or shortness of breath.  1 Inhaler  2  . ALPRAZolam (XANAX) 0.5 MG tablet Take 0.5 mg by mouth 3 (three) times daily.       Marland Kitchen amitriptyline (ELAVIL) 100 MG tablet Take 50 mg by mouth at bedtime.       Marland Kitchen aspirin EC 81 MG tablet Take 81 mg by mouth at bedtime.       Marland Kitchen atorvastatin (LIPITOR) 40 MG tablet Take 1 tablet (40 mg total) by mouth at bedtime.  30 tablet  6  . Carboxymethylcellul-Glycerin (OPTIVE SENSITIVE OP) Apply 1 drop to eye 2 (two) times daily.       . Cholecalciferol (VITAMIN D) 400 UNITS capsule Take 400 Units by mouth every evening.       . docusate sodium ([employer]S STOOL SOFTENER) 100 MG capsule Take 100 mg by mouth at bedtime.      . folic acid (FOLVITE) 1 MG tablet Take 1 mg by mouth every morning.       . furosemide (LASIX) 20 MG tablet Take 1 tablet (20 mg total) by mouth every other day.  30 tablet  0  . HYDROcodone-acetaminophen (NORCO/VICODIN) 5-325 MG per tablet Take 1 tablet by mouth  3 (three) times daily.       . hydroxychloroquine (PLAQUENIL) 200 MG tablet Take 400 mg by mouth 2 (two) times daily.       . iron polysaccharides (NIFEREX) 150 MG capsule Take 150 mg by mouth every morning.       Marland Kitchen KLOR-CON 10 10 MEQ tablet Take 10 mEq by mouth 2 (two) times daily.       Marland Kitchen levothyroxine (SYNTHROID, LEVOTHROID) 25 MCG tablet Take 25 mcg by mouth every morning.       . Multiple Vitamins-Minerals (PRESERVISION/LUTEIN) CAPS Take 1 capsule by mouth 2 (two) times daily.       . nitroGLYCERIN (NITROSTAT) 0.4 MG SL tablet Place 1 tablet (0.4 mg total) under the tongue every 5 (five) minutes as needed.  100 tablet  6  . omeprazole (PRILOSEC) 20 MG capsule Take 20 mg by mouth 2 (two) times daily.       . raloxifene (EVISTA) 60 MG tablet Take 60 mg by mouth every morning.       . RESTASIS 0.05 % ophthalmic emulsion Place 1  drop into both eyes 2 (two) times daily.      . sodium bicarbonate 325 MG tablet Take 650 mg by mouth 3 (three) times daily.        No current facility-administered medications for this visit.    Discontinued Meds:   There are no discontinued medications.  Patient Active Problem List   Diagnosis Date Noted  . DOE (dyspnea on exertion) 10/22/2013  . CHF, acute 10/22/2013  . CHF (congestive heart failure) 10/22/2013  . Systemic lupus erythematosus   . Coronary artery disease   . Palpitations 04/21/2013  . Chronic anticoagulation 01/29/2012  . Pulmonary embolism 11/28/2011  . Chest pain 09/06/2011  . Anemia 05/24/2011  . Chronic kidney disease 05/24/2011  . Hypertension   . Hyperlipidemia   . Arteriosclerotic cardiovascular disease (ASCVD)   . Sjogren's syndrome   . Tobacco abuse 08/08/2010  . WEIGHT LOSS, ABNORMAL 07/05/2010  . Orthostatic hypotension 01/24/2010  . SYNCOPE 08/28/2009  . LYMPHOMA 10/31/2008    LABS    Component Value Date/Time   NA 137 10/24/2013 0615   NA 138 10/22/2013 1808   NA 137 04/07/2013 1909   K 4.0 10/24/2013 0615   K 4.2  10/22/2013 1808   K 3.7 04/07/2013 1909   CL 104 10/24/2013 0615   CL 103 10/22/2013 1808   CL 105 04/07/2013 1909   CO2 20 10/24/2013 0615   CO2 21 10/22/2013 1808   CO2 21 04/07/2013 1909   GLUCOSE 97 10/24/2013 0615   GLUCOSE 77 10/22/2013 1808   GLUCOSE 118* 04/07/2013 1909   BUN 26* 10/24/2013 0615   BUN 23 10/22/2013 1808   BUN 23 04/07/2013 1909   CREATININE 1.60* 10/24/2013 0615   CREATININE 1.51* 10/22/2013 1808   CREATININE 1.69* 04/07/2013 1909   CREATININE 1.72* 06/01/2012 1413   CREATININE 1.87* 05/08/2012 1440   CREATININE 1.73* 12/06/2011 1345   CALCIUM 9.0 10/24/2013 0615   CALCIUM 9.1 10/22/2013 1808   CALCIUM 8.9 04/07/2013 1909   GFRNONAA 31* 10/24/2013 0615   GFRNONAA 34* 10/22/2013 1808   GFRNONAA 29* 04/07/2013 1909   GFRAA 36* 10/24/2013 0615   GFRAA 39* 10/22/2013 1808   GFRAA 34* 04/07/2013 1909   CMP     Component Value Date/Time   NA 137 10/24/2013 0615   K 4.0 10/24/2013 0615   CL 104 10/24/2013 0615   CO2 20 10/24/2013 0615   GLUCOSE 97 10/24/2013 0615   BUN 26* 10/24/2013 0615   CREATININE 1.60* 10/24/2013 0615   CREATININE 1.72* 06/01/2012 1413   CALCIUM 9.0 10/24/2013 0615   PROT 8.0 04/07/2013 1909   ALBUMIN 3.1* 04/07/2013 1909   AST 17 04/07/2013 1909   ALT 9 04/07/2013 1909   ALKPHOS 62 04/07/2013 1909   BILITOT 0.2* 04/07/2013 1909   GFRNONAA 31* 10/24/2013 0615   GFRAA 36* 10/24/2013 0615       Component Value Date/Time   WBC 5.8 10/22/2013 1808   WBC 5.1 04/07/2013 1909   WBC 3.8* 06/13/2012 1906   HGB 11.6* 10/22/2013 1808   HGB 11.2* 04/07/2013 1909   HGB 10.4* 06/13/2012 1906   HCT 35.6* 10/22/2013 1808   HCT 34.8* 04/07/2013 1909   HCT 32.8* 06/13/2012 1906   MCV 94.9 10/22/2013 1808   MCV 95.6 04/07/2013 1909   MCV 93.4 06/13/2012 1906    Lipid Panel     Component Value Date/Time   CHOL 152 05/15/2011 0000   TRIG 61 05/15/2011 0000   HDL 58 05/15/2011 0000  CHOLHDL 2.6 05/15/2011 0000   VLDL 12 05/15/2011 0000   LDLCALC 82 05/15/2011 0000     ABG No results found for this basename: phart, pco2, pco2art, po2, po2art, hco3, tco2, acidbasedef, o2sat     Lab Results  Component Value Date   TSH 4.193 07/17/2010   BNP (last 3 results)  Recent Labs  10/22/13 1808  PROBNP 5385.0*   Cardiac Panel (last 3 results) No results found for this basename: CKTOTAL, CKMB, TROPONINI, RELINDX,  in the last 72 hours  Iron/TIBC/Ferritin    Component Value Date/Time   IRON 48 05/24/2011 1211   TIBC 336 05/24/2011 1211   FERRITIN 16 05/24/2011 1211     EKG Orders placed during the hospital encounter of 10/22/13  . EKG 12-LEAD  . EKG 12-LEAD     Prior Assessment and Plan Problem List as of 11/02/2013     Cardiovascular and Mediastinum   Orthostatic hypotension   Last Assessment & Plan   04/21/2013 Office Visit Written 04/21/2013  1:28 PM by Lendon Colonel, NP     I have explained to her that she does not tolerate a lower more normal BP as this causes significant symptoms of dizziness and near syncope. She will continue on florinef 0.1 mg daily. She does not wish to take it at night. I have reviewed labs and found them to be WNL from ER.     SYNCOPE   Last Assessment & Plan   06/01/2012 Office Visit Written 06/01/2012  2:39 PM by Lendon Colonel, NP     No further complaints of syncope, dizziness or near syncope.    Hypertension   Last Assessment & Plan   06/01/2012 Office Visit Written 06/01/2012  2:38 PM by Lendon Colonel, NP     Essentially good control of BP at present. Heart rate is irregular. EKG shows frequent PVC's. I have her on potassium replacement with history of hypokalemia. I will check labs today as her PCP adjusted her dose, and she is finding that palpitations are worsening.      Arteriosclerotic cardiovascular disease (ASCVD)   Last Assessment & Plan   06/01/2012 Office Visit Written 06/01/2012  2:39 PM by Lendon Colonel, NP     Other than frequent palpitations, she is without complaint. No chest pain  or significiant DOE.    Pulmonary embolism   Last Assessment & Plan   11/28/2011 Office Visit Written 11/28/2011  4:09 PM by Lendon Colonel, NP     She is currently on Xarelto but has not started this yet. Will begin today. I will check CBC and BMET for follow-up labs with history of anemia and CKD on this medication. Hemoccult cards will be provided.      Coronary artery disease   CHF, acute   CHF (congestive heart failure)     Digestive   Sjogren's syndrome     Genitourinary   Chronic kidney disease   Last Assessment & Plan   04/16/2012 Office Visit Written 04/16/2012  4:33 PM by Lendon Colonel, NP     Repeat labs in 2 weeks for ongoing assessment.      Other   LYMPHOMA   WEIGHT LOSS, ABNORMAL   Last Assessment & Plan   05/24/2011 Office Visit Written 05/24/2011  1:01 PM by Yehuda Savannah, MD     Weight has been stable since her last visit.    Tobacco abuse   Hyperlipidemia   Last Assessment & Plan  05/24/2011 Office Visit Written 05/24/2011 12:56 PM by Yehuda Savannah, MD     Lipid control was excellent when last assessed one month ago.  Current therapy will be continued.    Anemia   Last Assessment & Plan   05/24/2011 Office Visit Written 05/24/2011 12:53 PM by Yehuda Savannah, MD     Patient has had long-standing anemia, present at this level or worse for at least the past few years.  Iron studies obtained in 2011 were nondiagnostic with serum iron of 67, TIBC of 179, saturation 37% and a ferritin of 56, subsequently measured at 93.  She reports colonoscopy approximately 3-4 years ago with negative findings.  Anemia appears out of portion to her connective tissue problems.  Stool for Hemoccult testing will be obtained.  TSH was normal in 2012 as was cortisol in 2011.  She has never been seen by a hematologist and will address a referral at her next visit with her PCP.    Chest pain   Last Assessment & Plan   04/16/2012 Office Visit Written 04/16/2012  4:32 PM by  Lendon Colonel, NP     She has no further complaints at this time. I did notice that she is hypokalemic with level of 3.3 that has not been repleted. She was taking potassium 20 mEq  BID but had been on it TID. She will return to TID dose. Follow-labs will be completed in a couple of weeks. She will see [place] again in 2 months and then return to every 6 months if all is well.    Chronic anticoagulation   Palpitations   Last Assessment & Plan   04/21/2013 Office Visit Written 04/21/2013  1:31 PM by Lendon Colonel, NP     Review of EKG from ER and from today's office visit shows that she is having PVC's. No couplets or bigeminy. They appear to be benign. Reassurance is given. Will see her in 6 months. She is advised to decrease caffeine as she drinks several Dr. Samson Frederic a day.    Systemic lupus erythematosus   DOE (dyspnea on exertion)       Imaging: Dg Chest 2 View  10/24/2013   CLINICAL DATA:  Cough next  EXAM: CHEST  2 VIEW  COMPARISON:  10/22/2013  FINDINGS: Heart size is normal. No pleural effusion or edema. No airspace consolidation identified. The lungs are hyperinflated and there are coarsened interstitial markings identified bilaterally. Spondylosis noted within the thoracic spine.  IMPRESSION: 1. No acute findings. 2. Suspect emphysema.   Electronically Signed   By: Kerby Moors M.D.   On: 10/24/2013 09:19   Dg Chest 2 View  10/22/2013   CLINICAL DATA:  Shortness of breath, cough, weakness  EXAM: CHEST  2 VIEW  COMPARISON:  10/22/2013  FINDINGS: Chronic interstitial markings/emphysematous changes. No focal consolidation. No pleural effusion or pneumothorax.  The heart is normal in size.  Degenerative changes of the visualized thoracolumbar spine.  Cholecystectomy clips.  IMPRESSION: No evidence of acute cardiopulmonary disease.   Electronically Signed   By: Julian Hy M.D.   On: 10/22/2013 18:05

## 2013-11-02 NOTE — Patient Instructions (Signed)
Your physician recommends that you schedule a follow-up appointment in: 4 months with one of our cardiologists  Please get blood work (BMET) in 3 months Please have echo cardiogram done in 3 months Your physician has requested that you have an echocardiogram. Echocardiography is a painless test that uses sound waves to create images of your heart. It provides your doctor with information about the size and shape of your heart and how well your heart's chambers and valves are working. This procedure takes approximately one hour. There are no restrictions for this procedure.   Your physician has recommended you make the following change in your medication:   Hold your Lasix if you feel dizzy or your systolic blood pressure (top number in BP reading) is below 115     Thank you for choosing [facility] !

## 2013-11-22 ENCOUNTER — Telehealth: Payer: Self-pay | Admitting: Adult Health

## 2013-11-22 NOTE — Telephone Encounter (Signed)
Would like return phone call from nurse.  When asked what it was in regards to, she stated her heart.  tgs

## 2013-11-22 NOTE — Telephone Encounter (Signed)
Pt states that her home health nurse came to her house yesterday stating " some people's heart are pumping normal, and yours is all over the place". This nurse asked pt what she meant by that, pt stated she did not know. Her BP and pulse is good, she stated. 120s over 70s. Pt is asymptomatic. I made pt aware if she has any symptoms to call our office back.

## 2013-12-22 ENCOUNTER — Telehealth: Payer: Self-pay | Admitting: *Deleted

## 2013-12-22 MED ORDER — FUROSEMIDE 20 MG PO TABS: 20.0000 mg | ORAL_TABLET | ORAL | Status: DC

## 2013-12-22 NOTE — Telephone Encounter (Signed)
PT NEEDS HER LASIX CALLED IN TO [facility] [place]

## 2014-02-02 ENCOUNTER — Ambulatory Visit (HOSPITAL_COMMUNITY)
Admission: RE | Admit: 2014-02-02 | Discharge: 2014-02-02 | Disposition: A | Discharge status: Home / Self Care | Payer: PRIVATE HEALTH INSURANCE | Source: Ambulatory Visit | Attending: Adult Health | Admitting: Adult Health

## 2014-02-02 DIAGNOSIS — I059 Rheumatic mitral valve disease, unspecified: Secondary | ICD-10-CM | POA: Insufficient documentation

## 2014-02-02 DIAGNOSIS — R0602 Shortness of breath: Secondary | ICD-10-CM | POA: Diagnosis not present

## 2014-02-02 DIAGNOSIS — I079 Rheumatic tricuspid valve disease, unspecified: Secondary | ICD-10-CM | POA: Insufficient documentation

## 2014-02-02 DIAGNOSIS — I5032 Chronic diastolic (congestive) heart failure: Secondary | ICD-10-CM | POA: Insufficient documentation

## 2014-02-02 LAB — BASIC METABOLIC PANEL
BUN: 20 mg/dL (ref 6–23)
CO2: 24 mEq/L (ref 19–32)
Calcium: 9.3 mg/dL (ref 8.4–10.5)
Chloride: 116 mEq/L — ABNORMAL HIGH (ref 96–112)
Creat: 1.73 mg/dL — ABNORMAL HIGH (ref 0.50–1.10)
Glucose, Bld: 76 mg/dL (ref 70–99)
Potassium: 4.3 mEq/L (ref 3.5–5.3)
Sodium: 148 mEq/L — ABNORMAL HIGH (ref 135–145)

## 2014-02-02 NOTE — Progress Notes (Signed)
  Echocardiogram 2D Echocardiogram has been performed.  [place], [place] 02/02/2014, 9:17 AM

## 2014-02-11 ENCOUNTER — Other Ambulatory Visit: Payer: Self-pay | Admitting: Adult Health

## 2014-02-11 NOTE — Telephone Encounter (Signed)
Requests refill for florinef,is pt on drug,I don't see it      Forward to [facility] NP

## 2014-02-11 NOTE — Telephone Encounter (Signed)
I called [facility] and let them know we did not prescribe this

## 2014-02-11 NOTE — Telephone Encounter (Signed)
Not on florinef on last office note. No refills from my end.

## 2014-02-21 ENCOUNTER — Telehealth: Payer: Self-pay | Admitting: Adult Health

## 2014-02-21 ENCOUNTER — Other Ambulatory Visit: Payer: Self-pay | Admitting: Adult Health

## 2014-02-21 MED ORDER — FUROSEMIDE 20 MG PO TABS: 20.0000 mg | ORAL_TABLET | ORAL | Status: DC

## 2014-02-21 NOTE — Telephone Encounter (Signed)
Please see refill bin / tgs  °

## 2014-02-21 NOTE — Telephone Encounter (Signed)
Refilled 10/12

## 2014-03-14 ENCOUNTER — Encounter: Payer: Self-pay | Admitting: Adult Health

## 2014-07-07 ENCOUNTER — Other Ambulatory Visit: Payer: Self-pay | Admitting: Adult Health

## 2014-07-13 ENCOUNTER — Emergency Department (HOSPITAL_COMMUNITY): Payer: Medicare Other

## 2014-07-13 ENCOUNTER — Encounter (HOSPITAL_COMMUNITY): Payer: Self-pay | Admitting: Emergency Medicine

## 2014-07-13 ENCOUNTER — Emergency Department (HOSPITAL_COMMUNITY)
Admission: EM | Admit: 2014-07-13 | Discharge: 2014-07-13 | Disposition: A | Discharge status: Home / Self Care | Payer: Medicare Other | Attending: Emergency Medicine | Admitting: Emergency Medicine

## 2014-07-13 DIAGNOSIS — Z85828 Personal history of other malignant neoplasm of skin: Secondary | ICD-10-CM | POA: Insufficient documentation

## 2014-07-13 DIAGNOSIS — Z8572 Personal history of non-Hodgkin lymphomas: Secondary | ICD-10-CM | POA: Diagnosis not present

## 2014-07-13 DIAGNOSIS — R079 Chest pain, unspecified: Secondary | ICD-10-CM | POA: Insufficient documentation

## 2014-07-13 DIAGNOSIS — R0789 Other chest pain: Secondary | ICD-10-CM

## 2014-07-13 DIAGNOSIS — N184 Chronic kidney disease, stage 4 (severe): Secondary | ICD-10-CM | POA: Diagnosis present

## 2014-07-13 DIAGNOSIS — R51 Headache: Secondary | ICD-10-CM | POA: Diagnosis not present

## 2014-07-13 DIAGNOSIS — Z720 Tobacco use: Secondary | ICD-10-CM | POA: Diagnosis not present

## 2014-07-13 DIAGNOSIS — I2510 Atherosclerotic heart disease of native coronary artery without angina pectoris: Secondary | ICD-10-CM | POA: Diagnosis not present

## 2014-07-13 DIAGNOSIS — Z7982 Long term (current) use of aspirin: Secondary | ICD-10-CM | POA: Insufficient documentation

## 2014-07-13 DIAGNOSIS — N183 Chronic kidney disease, stage 3 (moderate): Secondary | ICD-10-CM

## 2014-07-13 DIAGNOSIS — E785 Hyperlipidemia, unspecified: Secondary | ICD-10-CM | POA: Diagnosis not present

## 2014-07-13 DIAGNOSIS — Z9861 Coronary angioplasty status: Secondary | ICD-10-CM | POA: Diagnosis not present

## 2014-07-13 DIAGNOSIS — N189 Chronic kidney disease, unspecified: Secondary | ICD-10-CM | POA: Diagnosis not present

## 2014-07-13 DIAGNOSIS — I10 Essential (primary) hypertension: Secondary | ICD-10-CM

## 2014-07-13 DIAGNOSIS — F329 Major depressive disorder, single episode, unspecified: Secondary | ICD-10-CM | POA: Insufficient documentation

## 2014-07-13 DIAGNOSIS — I129 Hypertensive chronic kidney disease with stage 1 through stage 4 chronic kidney disease, or unspecified chronic kidney disease: Secondary | ICD-10-CM | POA: Diagnosis not present

## 2014-07-13 DIAGNOSIS — E079 Disorder of thyroid, unspecified: Secondary | ICD-10-CM | POA: Insufficient documentation

## 2014-07-13 DIAGNOSIS — Z8739 Personal history of other diseases of the musculoskeletal system and connective tissue: Secondary | ICD-10-CM | POA: Diagnosis not present

## 2014-07-13 DIAGNOSIS — J449 Chronic obstructive pulmonary disease, unspecified: Secondary | ICD-10-CM | POA: Insufficient documentation

## 2014-07-13 DIAGNOSIS — Z79899 Other long term (current) drug therapy: Secondary | ICD-10-CM | POA: Insufficient documentation

## 2014-07-13 DIAGNOSIS — I252 Old myocardial infarction: Secondary | ICD-10-CM | POA: Insufficient documentation

## 2014-07-13 DIAGNOSIS — G8929 Other chronic pain: Secondary | ICD-10-CM | POA: Diagnosis not present

## 2014-07-13 DIAGNOSIS — D649 Anemia, unspecified: Secondary | ICD-10-CM | POA: Diagnosis not present

## 2014-07-13 HISTORY — DX: Chronic obstructive pulmonary disease, unspecified: J44.9

## 2014-07-13 LAB — CBC
HCT: 36.2 % (ref 36.0–46.0)
Hemoglobin: 11.3 g/dL — ABNORMAL LOW (ref 12.0–15.0)
MCH: 30.5 pg (ref 26.0–34.0)
MCHC: 31.2 g/dL (ref 30.0–36.0)
MCV: 97.8 fL (ref 78.0–100.0)
PLATELETS: 169 10*3/uL (ref 150–400)
RBC: 3.7 MIL/uL — ABNORMAL LOW (ref 3.87–5.11)
RDW: 14.1 % (ref 11.5–15.5)
WBC: 5.4 10*3/uL (ref 4.0–10.5)

## 2014-07-13 LAB — COMPREHENSIVE METABOLIC PANEL
ALT: 17 U/L (ref 0–35)
AST: 23 U/L (ref 0–37)
Albumin: 3.5 g/dL (ref 3.5–5.2)
Alkaline Phosphatase: 60 U/L (ref 39–117)
Anion gap: 6 (ref 5–15)
BUN: 23 mg/dL (ref 6–23)
CALCIUM: 9.1 mg/dL (ref 8.4–10.5)
CO2: 25 mmol/L (ref 19–32)
CREATININE: 1.68 mg/dL — AB (ref 0.50–1.10)
Chloride: 109 mmol/L (ref 96–112)
GFR calc Af Amer: 34 mL/min — ABNORMAL LOW (ref >90)
GFR, EST NON AFRICAN AMERICAN: 29 mL/min — AB (ref >90)
Glucose, Bld: 82 mg/dL (ref 70–99)
Potassium: 4.8 mmol/L (ref 3.5–5.1)
Sodium: 140 mmol/L (ref 135–145)
Total Bilirubin: 0.3 mg/dL (ref 0.3–1.2)
Total Protein: 7.6 g/dL (ref 6.0–8.3)

## 2014-07-13 LAB — BRAIN NATRIURETIC PEPTIDE: B Natriuretic Peptide: 103 pg/mL — ABNORMAL HIGH (ref 0.0–100.0)

## 2014-07-13 LAB — TROPONIN I: Troponin I: <0.03 ng/mL (ref <0.031)

## 2014-07-13 MED ORDER — ACETAMINOPHEN 500 MG PO TABS
1000.0000 mg | ORAL_TABLET | Freq: Once | ORAL | Status: AC
  Administered 2014-07-13: 1000 mg via ORAL
  Filled 2014-07-13: qty 2

## 2014-07-13 NOTE — Consult Note (Signed)
[facility] Consult Note  Tiffany [name] YIR:[id number] DOB: [birth year] DOA: 07/13/2014  Referring physician: Dr Thurnell Garbe - [facility] PCP: Cleda Mccreedy, MD   Chief Complaint: CP  HPI: Tiffany [name] is a 74 y.o. female  CP started a couple days ago. Feels liek someone pressing on chest. Episodes self resolve in 30 min. Resolve w/ deep breaths. Episodes come on w/ stress but not with exertion. Relieved w/in 5 min with nitro. Denies palpitations, SOB, nausea, diaphoresis, syncope, HA w/ episodes. Patient endorses that the symptoms are worse when under stress. Reports hearing of recent miscarriage of granddaughter which is greatly concerned patient recently. Patient endorsing mild headache for the last couple of days which is worse with nitroglycerin. Has not taken anything to try and relieve her symptoms.  Review of Systems:  Constitutional:  No weight loss, night sweats, Fevers, chills, fatigue.  HEENT:  No, Difficulty swallowing,Tooth/dental problems,Sore throat,  No sneezing, itching, ear ache, nasal congestion, post nasal drip,  Cardio-vascular:per history of present illness GI:  No heartburn, indigestion, abdominal pain, nausea, vomiting, diarrhea, change in bowel habits, loss of appetite  Resp:  No shortness of breath with exertion or at rest. No excess mucus, no productive cough, No non-productive cough, No coughing up of blood.No change in color of mucus.No wheezing.No chest wall deformity  Skin:  no rash or lesions.  GU:  no dysuria, change in color of urine, no urgency or frequency. No flank pain.  Musculoskeletal:   No joint pain or swelling. No decreased range of motion. No back pain.  Psych:  No change in mood or affect. No depression or anxiety. No memory loss.   Past Medical History  Diagnosis Date  . Lymphoma   . Systemic lupus erythematosus   . Depression   . Hyperlipidemia   . 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 Sjogren's syndrome   . Orthostatic hypotension   . Dysphagia   . Syncope   . Tobacco abuse   . Weight loss   . Anemia   . Hypertension     hypotension  . Chronic anticoagulation 01/29/2012  . Chronic kidney disease   . 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 Thyroid disease   . Coronary artery disease     MI  . COPD (chronic obstructive pulmonary disease)    Past Surgical History  Procedure Laterality Date  . Breast excisional biopsy      Left  . Eye surgery    . Mass excision      Lymphoma  . Coronary angioplasty with stent placement    . Cholecystectomy    . Cataract extraction Bilateral    Social History:  reports that she has been smoking Cigarettes.  She has a 5.75 pack-year smoking history. She has never used smokeless tobacco. She reports that she does not drink alcohol or use illicit drugs.  Allergies  Allergen Reactions  . Florinef [Fludrocortisone Acetate]     Malaise: Discomfort   . Iohexol Other (See Comments)    Pt. States that Dr. Florene Glen her kidney Dr. Does not want her to receive iv dye due to increased risk of kidney failure.   . Sulfonamide Derivatives Hives  . Tape Rash    ADHESIVE    Family History  Problem Relation Age of Onset  . Coronary artery disease Father   . Coronary  artery disease Mother   . Liver disease Mother   . Cancer Brother     esophagus  . Cancer Sister     breast     Prior to Admission medications   Medication Sig Start Date End Date Taking? Authorizing Provider  albuterol (PROVENTIL HFA;VENTOLIN HFA) 108 (90 BASE) MCG/ACT inhaler Inhale 2 puffs into the lungs every 6 (six) hours as needed for wheezing or shortness of breath. 10/24/13  Yes Nita Sells, MD  ALPRAZolam Duanne Moron) 0.5 MG tablet Take 0.5 mg by mouth 3 (three) times daily.    Yes Historical Provider, MD  Artificial Tear Solution (SOOTHE XP OP) Apply 1 drop to eye 2 (two) times daily.   Yes Historical Provider, MD    aspirin EC 81 MG tablet Take 81 mg by mouth at bedtime.    Yes Historical Provider, MD  atorvastatin (LIPITOR) 40 MG tablet Take 1 tablet (40 mg total) by mouth at bedtime. 04/21/13  Yes Lendon Colonel, NP  Cholecalciferol (VITAMIN D) 400 UNITS capsule Take 400 Units by mouth every evening.    Yes Historical Provider, MD  clobetasol cream (TEMOVATE) 8.50 % Apply 1 application topically 2 (two) times daily.   Yes Historical Provider, MD  Cyanocobalamin (B-12 PO) Take 1 tablet by mouth daily.   Yes Historical Provider, MD  docusate sodium ([employer]S STOOL SOFTENER) 100 MG capsule Take 100 mg by mouth at bedtime.   Yes Historical Provider, MD  fludrocortisone (FLORINEF) 0.1 MG tablet Take 0.1 mg by mouth daily.   Yes Historical Provider, MD  folic acid (FOLVITE) 1 MG tablet Take 1 mg by mouth every morning.    Yes Historical Provider, MD  furosemide (LASIX) 20 MG tablet TAKE 1 TABLET (20 MG TOTAL) BY MOUTH EVERY OTHER DAY AS NEEDED FOR EDEMA 07/07/14  Yes Lendon Colonel, NP  HYDROcodone-acetaminophen (NORCO/VICODIN) 5-325 MG per tablet Take 1 tablet by mouth 3 (three) times daily.    Yes Historical Provider, MD  hydroxychloroquine (PLAQUENIL) 200 MG tablet Take 400 mg by mouth daily. To be taken with food or milk 02/07/13  Yes Historical Provider, MD  iron polysaccharides (NIFEREX) 150 MG capsule Take 150 mg by mouth every morning.    Yes Historical Provider, MD  KLOR-CON 10 10 MEQ tablet Take 10 mEq by mouth 3 (three) times daily.  03/15/13  Yes Historical Provider, MD  levothyroxine (SYNTHROID, LEVOTHROID) 25 MCG tablet Take 25 mcg by mouth every morning.    Yes Historical Provider, MD  Multiple Vitamins-Minerals (PRESERVISION/LUTEIN) CAPS Take 1 capsule by mouth 2 (two) times daily.    Yes Historical Provider, MD  nitroGLYCERIN (NITROSTAT) 0.4 MG SL tablet Place 1 tablet (0.4 mg total) under the tongue every 5 (five) minutes as needed. 05/20/11  Yes Lendon Colonel, NP  omeprazole (PRILOSEC) 20 MG  capsule Take 20 mg by mouth 2 (two) times daily.    Yes Historical Provider, MD  raloxifene (EVISTA) 60 MG tablet Take 60 mg by mouth every morning.    Yes Historical Provider, MD  sodium bicarbonate 325 MG tablet Take 650 mg by mouth 3 (three) times daily.    Yes Historical Provider, MD  Travoprost, BAK Free, (TRAVATAN) 0.004 % SOLN ophthalmic solution Place 1 drop into both eyes at bedtime.   Yes Historical Provider, MD   Physical Exam: Filed Vitals:   07/13/14 1730 07/13/14 1800 07/13/14 1830 07/13/14 1929  BP: 142/69 137/68 147/82 155/80  Pulse: 72 71 79 72  Temp:  98.1 F (36.7 C)  TempSrc:    Oral  Resp: 14 13 18 16   Height:      Weight:      SpO2: 99% 97% 100% 98%    Wt Readings from Last 3 Encounters:  07/13/14 61.236 kg (135 lb)  11/02/13 62.596 kg (138 lb)  10/24/13 62.6 kg (138 lb 0.1 oz)    General: Appears calm and comfortable Eyes:  PERRL, normal lids, irises & conjunctiva ENT:  grossly normal hearing, lips & tongue Neck:  no LAD, masses or thyromegaly Cardiovascular:  RRR, II/VI systolic murmur. No LE edema. Telemetry:  SR, no arrhythmias  Respiratory:  CTA bilaterally, no w/r/r. Normal respiratory effort. Abdomen:  soft, ntnd Skin:  no rash or induration seen on limited exam Musculoskeletal:  grossly normal tone BUE/BLE Psychiatric:  grossly normal mood and affect, speech fluent and appropriate Neurologic:  grossly non-focal.          Labs on Admission:  Basic Metabolic Panel:  Recent Labs Lab 07/13/14 1639  NA 140  K 4.8  CL 109  CO2 25  GLUCOSE 82  BUN 23  CREATININE 1.68*  CALCIUM 9.1   Liver Function Tests:  Recent Labs Lab 07/13/14 1639  AST 23  ALT 17  ALKPHOS 60  BILITOT 0.3  PROT 7.6  ALBUMIN 3.5   No results for input(s): LIPASE, AMYLASE in the last 168 hours. No results for input(s): AMMONIA in the last 168 hours. CBC:  Recent Labs Lab 07/13/14 1639  WBC 5.4  HGB 11.3*  HCT 36.2  MCV 97.8  PLT 169   Cardiac  Enzymes:  Recent Labs Lab 07/13/14 1639  TROPONINI <0.03    BNP (last 3 results)  Recent Labs  07/13/14 1639  BNP 103.0*    ProBNP (last 3 results)  Recent Labs  10/22/13 1808  PROBNP 5385.0*    CBG: No results for input(s): GLUCAP in the last 168 hours.  Radiological Exams on Admission: Dg Chest 2 View  07/13/2014   CLINICAL DATA:  Chest pain radiating to the back of the neck. Headache for 3 days. No shortness of breath or cough.  EXAM: CHEST  2 VIEW  COMPARISON:  10/24/2013.  FINDINGS: Emphysema is present. Bilateral pleural apical scarring is chronic. The cardiopericardial silhouette is within normal limits. Aortic arch atherosclerosis. Thoracic spine DISH. Cholecystectomy clips are present in the right upper quadrant.  IMPRESSION: Emphysema without acute cardiopulmonary disease.   Electronically Signed   By: Dereck Ligas M.D.   On: 07/13/2014 16:20    EKG: Independently reviewed. NSR, No sign of ACS  Assessment/Plan Active Problems:   Tobacco abuse   Hypertension   Chronic kidney disease   Atypical chest pain  74 year old female with past medical history significant for lymphoma, lupus, depression, hyperlipidemia, coronary artery disease status post cardiac catheterization and stent placement in 2007, Sjogren's, tobacco abuse, hypertension, chronic kidney disease, hypothyroidism, COPD presenting with atypical chest pain.    chest pain: Stress and anxiety induced versus cardiac etiology. Patient endorses ability to exert herself without reproducible chest pain. Patient with established cardiology team here in town and will call cardiologist for visit tomorrow morning. EKG unremarkable. Troponin negative. Patient reports numerous previous hospitalizations for chest pain which were negative. Patient currently chest pain-free. Patient requesting to go home.   Tobacco abuse: Patient counseled to quit smoking. Patient making good progress as has recently decreased from  half pack to core pack per day.  CKD stage III: Currently 1.68. Baseline  1.65. Continue follow-up with nephrology as outpatient.   Headache: Likely stress and nitroglycerin induced. Patient reports minimal symptoms at this time. No neurological deficits noted. Patient given Tylenol in the emergency room.  Patient to continue current medical regimen as outlined above.  Family Communication: Daughter Disposition Plan: Stable for discharge  Jermain Curt Lenna Sciara, MD Family Medicine [facility] [email or web address] Password [email or web address]

## 2014-07-13 NOTE — ED Provider Notes (Signed)
CSN: [id number]     Arrival date & time 07/13/14  1451 History   First MD Initiated Contact with Patient 07/13/14 1727     Chief Complaint  Patient presents with  . Chest Pain      HPI Pt was seen at 1735. Per pt, c/o gradual onset and persistence of multiple intermittent episodes of chest "pain" for the past 2 to 3 days. Describes the CP as "heaviness" and "pressure," occurring at rest and when she "is stressed." CP does not occur on exertion. Pt c/o SOB on exertion, but denies SOB occurs with CP. States the CP improves after she takes her own SL ntg or "takes deep breaths." States she has been taking "more ntg" over the past 2 days and "got worried that maybe I shouldn't be taking that much" so she "came to the ED to get my heart checked out."  Pt also c/o acute flair of her chronic headache, as well as left sided neck "pains." Denies any change in her usual chronic pain.  Denies headache was sudden or maximal in onset or at any time.  Denies visual changes, no focal motor weakness, no tingling/numbness in extremities, no fevers, no rash, no abd pain, no N/V/D, no cough, no palpitations.     Past Medical History  Diagnosis Date  . Lymphoma   . Systemic lupus erythematosus   . Depression   . Hyperlipidemia   . 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 Sjogren's syndrome   . Orthostatic hypotension   . Dysphagia   . Syncope   . Tobacco abuse   . Weight loss   . Anemia   . Hypertension     hypotension  . Chronic anticoagulation 01/29/2012  . Chronic kidney disease   . 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 Thyroid disease   . Coronary artery disease     MI  . COPD (chronic obstructive pulmonary disease)    Past Surgical History  Procedure Laterality Date  . Breast excisional biopsy      Left  . Eye surgery    . Mass excision      Lymphoma  . Coronary angioplasty with stent placement     . Cholecystectomy    . Cataract extraction Bilateral    Family History  Problem Relation Age of Onset  . Coronary artery disease Father   . Coronary artery disease Mother   . Liver disease Mother   . Cancer Brother     esophagus  . Cancer Sister     breast   History  Substance Use Topics  . Smoking status: Current Every Day Smoker -- 0.50 packs/day for 23 years    Types: Cigarettes  . Smokeless tobacco: Never Used  . Alcohol Use: No   OB History    Gravida Para Term Preterm AB TAB SAB Ectopic Multiple Living   3 3        3      Review of Systems ROS: Statement: All systems negative except as marked or noted in the HPI; Constitutional: Negative for fever and chills. ; ; Eyes: Negative for eye pain, redness and discharge. ; ; ENMT: Negative for ear pain, hoarseness, nasal congestion, sinus pressure and sore throat. ; ; Cardiovascular: +CP, SOB. Negative for palpitations, diaphoresis, and peripheral edema. ; ; Respiratory: Negative for cough, wheezing and stridor. ; ;  Gastrointestinal: Negative for nausea, vomiting, diarrhea, abdominal pain, blood in stool, hematemesis, jaundice and rectal bleeding. . ; ; Genitourinary: Negative for dysuria, flank pain and hematuria. ; ; Musculoskeletal: +left sided neck pain. Negative for back pain. Negative for swelling and trauma.; ; Skin: Negative for pruritus, rash, abrasions, blisters, bruising and skin lesion.; ; Neuro: +chronic headache. Negative for lightheadedness and neck stiffness. Negative for weakness, altered level of consciousness , altered mental status, extremity weakness, paresthesias, involuntary movement, seizure and syncope.     Allergies  Florinef; Iohexol; Sulfonamide derivatives; and Tape  Home Medications   Prior to Admission medications   Medication Sig Start Date End Date Taking? Authorizing Provider  albuterol (PROVENTIL HFA;VENTOLIN HFA) 108 (90 BASE) MCG/ACT inhaler Inhale 2 puffs into the lungs every 6 (six) hours as  needed for wheezing or shortness of breath. 10/24/13  Yes Nita Sells, MD  ALPRAZolam Duanne Moron) 0.5 MG tablet Take 0.5 mg by mouth 3 (three) times daily.    Yes Historical Provider, MD  Artificial Tear Solution (SOOTHE XP OP) Apply 1 drop to eye 2 (two) times daily.   Yes Historical Provider, MD  aspirin EC 81 MG tablet Take 81 mg by mouth at bedtime.    Yes Historical Provider, MD  Cholecalciferol (VITAMIN D) 400 UNITS capsule Take 400 Units by mouth every evening.    Yes Historical Provider, MD  Cyanocobalamin (B-12 PO) Take 1 tablet by mouth daily.   Yes Historical Provider, MD  docusate sodium (CVS STOOL SOFTENER) 100 MG capsule Take 100 mg by mouth at bedtime.   Yes Historical Provider, MD  HYDROcodone-acetaminophen (NORCO/VICODIN) 5-325 MG per tablet Take 1 tablet by mouth 3 (three) times daily.    Yes Historical Provider, MD  iron polysaccharides (NIFEREX) 150 MG capsule Take 150 mg by mouth every morning.    Yes Historical Provider, MD  Multiple Vitamins-Minerals (PRESERVISION/LUTEIN) CAPS Take 1 capsule by mouth 2 (two) times daily.    Yes Historical Provider, MD  nitroGLYCERIN (NITROSTAT) 0.4 MG SL tablet Place 1 tablet (0.4 mg total) under the tongue every 5 (five) minutes as needed. 05/20/11  Yes Lendon Colonel, NP  amitriptyline (ELAVIL) 100 MG tablet Take 50 mg by mouth at bedtime.     Historical Provider, MD  atorvastatin (LIPITOR) 40 MG tablet Take 1 tablet (40 mg total) by mouth at bedtime. 04/21/13   Lendon Colonel, NP  folic acid (FOLVITE) 1 MG tablet Take 1 mg by mouth every morning.     Historical Provider, MD  furosemide (LASIX) 20 MG tablet TAKE 1 TABLET (20 MG TOTAL) BY MOUTH EVERY OTHER DAY AS NEEDED FOR EDEMA 07/07/14   Lendon Colonel, NP  hydroxychloroquine (PLAQUENIL) 200 MG tablet Take 400 mg by mouth 2 (two) times daily.  02/07/13   Historical Provider, MD  KLOR-CON 10 10 MEQ tablet Take 10 mEq by mouth 2 (two) times daily.  03/15/13   Historical Provider, MD   levothyroxine (SYNTHROID, LEVOTHROID) 25 MCG tablet Take 25 mcg by mouth every morning.     Historical Provider, MD  omeprazole (PRILOSEC) 20 MG capsule Take 20 mg by mouth 2 (two) times daily.     Historical Provider, MD  raloxifene (EVISTA) 60 MG tablet Take 60 mg by mouth every morning.     Historical Provider, MD  sodium bicarbonate 325 MG tablet Take 650 mg by mouth 3 (three) times daily.     Historical Provider, MD   BP 137/68 mmHg  Pulse 71  Temp(Src)  98.3 F (36.8 C)  Resp 13  Ht 5\' 10"  (1.778 m)  Wt 135 lb (61.236 kg)  BMI 19.37 kg/m2  SpO2 97% Physical Exam  1740: Physical examination:  Nursing notes reviewed; Vital signs and O2 SAT reviewed;  Constitutional: Well developed, Well nourished, Well hydrated, In no acute distress; Head:  Normocephalic, atraumatic; Eyes: EOMI, PERRL, No scleral icterus; ENMT: Mouth and pharynx normal, Mucous membranes moist; Neck: Supple, Full range of motion, No lymphadenopathy. +TTP left SCM muscle.; Cardiovascular: Regular rate and rhythm, No gallop; Respiratory: Breath sounds clear & equal bilaterally, No rales, rhonchi, wheezes.  Speaking full sentences with ease, Normal respiratory effort/excursion; Chest: Nontender, Movement normal; Abdomen: Soft, Nontender, Nondistended, Normal bowel sounds; Genitourinary: No CVA tenderness; Spine:  No midline CS, TS, LS tenderness. +TTP left cervical paraspinal muscles. No rash.;; Extremities: Pulses normal, No tenderness, No edema, No calf edema or asymmetry.; Neuro: AA&Ox3, Major CN grossly intact.  Speech clear. No gross focal motor or sensory deficits in extremities.; Skin: Color normal, Warm, Dry.   ED Course  Procedures     EKG Interpretation   Date/Time:  Wednesday July 13 2014 14:55:12 EST Ventricular Rate:  75 PR Interval:  164 QRS Duration: 98 QT Interval:  412 QTC Calculation: 460 R Axis:   74 Text Interpretation:  Normal sinus rhythm Normal ECG When compared with  ECG of 10/22/2013 QT has  shortened and ventricular bigeminy is no longer  Present Confirmed by [facility]  MD, Nunzio Cory [phone]) on 07/13/2014 6:01:03 PM      MDM  MDM Reviewed: previous chart, nursing note and vitals Reviewed previous: labs and ECG Interpretation: labs, ECG and x-ray   Results for orders placed or performed during the hospital encounter of 07/13/14  CBC  Result Value Ref Range   WBC 5.4 4.0 - 10.5 K/uL   RBC 3.70 (L) 3.87 - 5.11 MIL/uL   Hemoglobin 11.3 (L) 12.0 - 15.0 g/dL   HCT 36.2 36.0 - 46.0 %   MCV 97.8 78.0 - 100.0 fL   MCH 30.5 26.0 - 34.0 pg   MCHC 31.2 30.0 - 36.0 g/dL   RDW 14.1 11.5 - 15.5 %   Platelets 169 150 - 400 K/uL  Comprehensive metabolic panel  Result Value Ref Range   Sodium 140 135 - 145 mmol/L   Potassium 4.8 3.5 - 5.1 mmol/L   Chloride 109 96 - 112 mmol/L   CO2 25 19 - 32 mmol/L   Glucose, Bld 82 70 - 99 mg/dL   BUN 23 6 - 23 mg/dL   Creatinine, Ser 1.68 (H) 0.50 - 1.10 mg/dL   Calcium 9.1 8.4 - 10.5 mg/dL   Total Protein 7.6 6.0 - 8.3 g/dL   Albumin 3.5 3.5 - 5.2 g/dL   AST 23 0 - 37 U/L   ALT 17 0 - 35 U/L   Alkaline Phosphatase 60 39 - 117 U/L   Total Bilirubin 0.3 0.3 - 1.2 mg/dL   GFR calc non Af Amer 29 (L) >90 mL/min   GFR calc Af Amer 34 (L) >90 mL/min   Anion gap 6 5 - 15  Troponin I (order at [facility])  Result Value Ref Range   Troponin I <0.03 <0.031 ng/mL   Dg Chest 2 View 07/13/2014   CLINICAL DATA:  Chest pain radiating to the back of the neck. Headache for 3 days. No shortness of breath or cough.  EXAM: CHEST  2 VIEW  COMPARISON:  10/24/2013.  FINDINGS: Emphysema is present. Bilateral  pleural apical scarring is chronic. The cardiopericardial silhouette is within normal limits. Aortic arch atherosclerosis. Thoracic spine DISH. Cholecystectomy clips are present in the right upper quadrant.  IMPRESSION: Emphysema without acute cardiopulmonary disease.   Electronically Signed   By: Dereck Ligas M.D.   On: 07/13/2014 16:20    1835  Denies CP while  in the ED. HPI with atypical CP features. Pt has multiple ACS risk factors; will observation admit. Dx and testing d/w pt and family.  Questions answered.  Verb understanding, agreeable to admit.  T/C to [facility] Dr. Marily Memos, case discussed, including:  HPI, pertinent PM/SHx, VS/PE, dx testing, ED course and treatment:  Agreeable to admit, requests he will come to the ED for evaluation.   1930:  [facility] MD has evaluated pt while in the ED: pt states she now does not want to be admitted and wants to go home, endorses she has f/u with her Cards MD tomorrow; [facility] states pt had had several admissions for CP which have been reassuring, HPI today with atypical features, he requests EDP to d/c pt and she will f/u with her Cards MD tomorrow.  Pt and family informed re: dx testing results, and that I recommend admission for further evaluation.  Pt refuses admission.  I encouraged pt to stay, continues to refuse.  Pt makes her own medical decisions.  Risks of AMA explained to pt and family, including, but not limited to:  stroke, heart attack, cardiac arrythmia ("irregular heart rate/beat"), "passing out," temporary and/or permanent disability, death.  Pt and family verb understanding and continue to refuse admission, understanding the consequences of their decision.  I encouraged pt to follow up with her Cardiologist tomorrow and return to the ED immediately if symptoms return, or for any other concerns.  Pt and family verb understanding, agreeable.   Francine Graven, DO 07/15/14 2337

## 2014-07-13 NOTE — Discharge Instructions (Signed)
°Emergency Department Resource Guide °1) Find a Doctor and Pay Out of Pocket °Although you won't have to find out who is covered by your insurance plan, it is a good idea to ask around and get recommendations. You will then need to call the office and see if the doctor you have chosen will accept you as a new patient and what types of options they offer for patients who are self-pay. Some doctors offer discounts or will set up payment plans for their patients who do not have insurance, but you will need to ask so you aren't surprised when you get to your appointment. ° °2) Contact Your Local Health Department °Not all health departments have doctors that can see patients for sick visits, but many do, so it is worth a call to see if yours does. If you don't know where your local health department is, you can check in your phone book. The CDC also has a tool to help you locate your state's health department, and many state websites also have listings of all of their local health departments. ° °3) Find a Walk-in Clinic °If your illness is not likely to be very severe or complicated, you may want to try a walk in clinic. These are popping up all over the country in pharmacies, drugstores, and shopping centers. They're usually staffed by nurse practitioners or physician assistants that have been trained to treat common illnesses and complaints. They're usually fairly quick and inexpensive. However, if you have serious medical issues or chronic medical problems, these are probably not your best option. ° °No Primary Care Doctor: °- Call [facility] at  [phone] - they can help you locate a primary care doctor that  accepts your insurance, provides certain services, etc. °- Physician Referral Service- 1-800-533-3463 ° °Chronic Pain Problems: °Organization         Address  Phone   Notes  °[facility]  ([phone]) [phone] Patients need to be referred by their primary care doctor.  ° °Medication  Assistance: °Organization         Address  Phone   Notes  °[place] [facility] [address] °[place], [place] [address] (336) 641-8030 --Must be a resident of [place] °-- Must have NO insurance coverage whatsoever (no Medicaid/ Medicare, etc.) °-- The pt. MUST have a primary care doctor that directs their care regularly and follows them in the community °  °[facility]  (866) 331-1348   °[employer]  (888) 892-1162   ° °Agencies that provide inexpensive medical care: °Organization         Address  Phone   Notes  °[facility]  (336) 832-8035   °[facility]    (336) 832-7272   °[facility] [address] °[place], [place] [address] (336) 832-4777   °[facility] [address], °[place] (336) 271-4999   °[facility]    (336) 373-0678   °[facility]    (336) 272-1050   °[facility] ° [address], [place] Phone:  (336) 832-4444, Fax:  (336) 832-4440 Hours of Operation:  9 am - 6 pm, M-F.  Also accepts Medicaid/Medicare and self-pay.  °[facility] ° [address], [place] Phone: (336) [phone], Fax: (336) [phone]. Hours of Operation:  8:30 am - 5:30 pm, M-F.  Also accepts Medicaid and self-pay.  °[facility] [place] [address], [place] Phone: (336) 878-6027   °[facility] [address], [place], [place] (336)723-1848, Ext. 123 Mondays & Thursdays: 7-9 AM.  First 15 patients are seen on a first come, first serve basis. °  ° °Medicaid-accepting [place] Providers: ° °Organization         Address  Phone   Notes  °[facility] [address], [place] (336) 641-2100 Also accepts self-pay patients.  °[facility] [address], [place] ° (336) [phone]   °[facility] [address], [place]  (336) 288-8857   °[facility] [address] [place] [address], [place] (336) 299-7000   °Veita Bland [address], [place]  ° (336) 373-1557 Only accepts [place] Access Medicaid patients after they have their name applied to their card.  ° °Self-Pay (no insurance) in [place]: ° °Organization         Address  Phone   Notes  °Sickle Cell Patients, [facility] [address], [place] (336) 832-1970   °[facility] [address], [place] (336) 832-4400   °[facility] [place] ° [address], [place] (336) 992-4800   °[facility]/Dr. Osei-Bonsu ° [address] [place] [address], [place] or [address], [place] (336) [phone] Phone number for both [place] and [place] locations is the same.  °[facility] [address], [place] (336) [phone]   °[facility] [address] [place] [address], [place] or [address] (336) 852-7530 °(336) 878-2260   °[facility] [address], [place] (336) 350-1642, phone; (336) 294-5005, fax Sees patients 1st and 3rd Saturday of every month.  Must not qualify for public or private insurance (i.e. Medicaid, Medicare, [place] Health Choice, Veterans' Benefits) • Household income should be no more than 200% of the poverty level •The clinic cannot treat you if you are pregnant or think you are pregnant • Sexually transmitted diseases are not treated at the clinic.  ° ° °Dental Care: °Organization         Address  Phone  Notes  °[place] [facility] [address],  (336) 641-6152 Accepts children up to age 21 who are enrolled in Medicaid or [place] Health Choice; pregnant women with a Medicaid card; and children who have applied for Medicaid or [place] Health Choice, but were declined, whose parents can pay a reduced fee at time of service.  °[place]  [facility] [place]  [address], [place] (336) 641-7733 Accepts children up to age 21 who are enrolled in Medicaid or [place] Health Choice; pregnant women with a Medicaid card; and children who have applied for Medicaid or  Health Choice, but were declined, whose parents can pay a reduced fee at time of service.  °[facility] ° [address],  (336) 641-4533 Patients are seen by appointment only. Walk-ins are not accepted. [facility] will see patients 18 years of age and older. °Monday - Tuesday (8am-5pm) °Most Wednesdays (8:30-5pm) °$30 per visit, cash only  °[facility] ° [address], [place] (336) [phone] Patients are seen by appointment only. Walk-ins are not accepted. [facility] will see patients 18 years of age and older. °One   Wednesday Evening (Monthly: Volunteer Based).  $30 per visit, cash only  °[facility]  (919) [phone] for adults; Children under age 4, call Graduate Pediatric Dentistry at ([phone]) [phone]. Children aged 4-14, please call ([phone]) [phone] to request a pediatric application. ° Dental services are provided in all areas of dental care including fillings, crowns and bridges, complete and partial dentures, implants, gum treatment, root canals, and extractions. Preventive care is also provided. Treatment is provided to both adults and children. °Patients are selected via a lottery and there is often a waiting list. °  °[facility] [address], °[place] ° (336) 763-8833 www.drcivils.com °  °[facility] [address], [place], [place] (336)723-1848, Ext. 123 Second and Fourth Thursday of each month, opens at 6:30 AM; Clinic ends at 9 AM.  Patients are seen on a first-come first-served basis, and a limited number are seen during each clinic.  ° °[facility] ° [address], [place], [place] (336) [phone]    Eligibility Requirements °You must have lived in [place], [place], or [place] counties for at least the last three months. °  You cannot be eligible for state or federal sponsored healthcare insurance, including Veterans Administration, Medicaid, or Medicare. °  You generally cannot be eligible for healthcare insurance through your employer.  °  How to apply: °Eligibility screenings are held every Tuesday and Wednesday afternoon from 1:00 pm until 4:00 pm. You do not need an appointment for the interview!  °[facility] [address], [place], [place] 336-631-2330   °[facility]  336-342-8273   °[facility]  336-703-3100   °[facility]  336-570-6415   ° °Behavioral Health Resources in the Community: °Intensive Outpatient Programs °Organization         Address  Phone  Notes  °[facility] [address], [place], [place] 336-878-6098   °[facility] [address], [place], [place] 336-832-9800   °[facility] [address], [place], [place] ° 336-882-2125   °[facility] [address],  °[place], [place] 1-800-853-5163 or 336-641-4981   °Substance Abuse Resources °Organization         Address  Phone  Notes  °[facility]  336-882-2125   °[facility]  336-784-9470   °[facility]  336-285-9073   °[facility]  336-845-3988   °Residential & Outpatient Substance Abuse Program  1-800-659-3381   °Psychological Services °Organization         Address  Phone  Notes  °[facility]  336- 832-9600   °[facility]  336- 378-7881   °[facility] [address], [place] 1-800-853-5163 or 336-641-4981   ° °Mobile Crisis Teams °Organization         Address  Phone  Notes  °[facility], Mobile Crisis Care Unit  1-877-626-1772   °Assertive °[facility] ° [address]  [place], [place] 336-834-9664   °Sharon DeEsch [address] °[place] 336-554-5454   ° °Self-Help/Support Groups °Organization         Address  Phone             Notes  °[facility] - variety of support groups  336- 373-1402 Call for more information  °Narcotics Anonymous (NA), [facility] [address], °[place]  2 meetings at this location  ° °  Residential Treatment Programs Organization         Address  Phone  Notes  [facility] [address]    [place]  1-(479) 067-0904   [facility]  [address], [place] [address], [place], [place]   [place], [place] [phone] Admissions: 8am-3pm M-F  [facility] [place] [address]    [place], [place] 354-562-5638   [facility] [address] [place], [place], [place]   [facility] [address],  [place], [place]   [facility] - Intensive Outpatient [address], [place], [place]   [facility] ([place]  [place], [place] 1-229-805-0934 or 432-785-0203   [facility] [address] [place], [place] Accepts Medicaid  [facility] [place] [address],  [place] 1-413 201 6526 Substance Abuse/Addiction Treatment   [facility]         Address  Phone  Notes  [facility]  512-331-1468   Domenic Schwab, PhD [address] [place], [place]   (470)603-5994 or 915-837-9096   [facility] [place], [place] (380) [phone]   [facility] [address] [place], [place] [phone] Insurance/Medicaid/sponsorship through [facility] and Families [address]                                    [place], [place] [phone] [place] [address], [place] [phone]) [phone]    Dr. Adele Schilder  ([phone]) [phone]   [facility] Dept. 1) [address] [place] 2) [place] 3)  [address], [place] [phone]  [phone]   [place] [phone] or [phone] (After Hours)      Take your usual prescriptions as previously directed.  Call your regular Cardiologist tomorrow morning to schedule a follow up appointment for tomorrow.  Return to the Emergency Department immediately sooner if worsening or you change your mind about being admitted to the hospital.

## 2014-07-13 NOTE — ED Notes (Signed)
Pt reports chest pain x 2 days, has been using nitro tabs. Pt states she is having pain in the L side of her neck and headaches.

## 2014-07-14 ENCOUNTER — Encounter: Payer: Self-pay | Admitting: Adult Health

## 2014-07-14 ENCOUNTER — Ambulatory Visit (INDEPENDENT_AMBULATORY_CARE_PROVIDER_SITE_OTHER): Payer: Medicare Other | Admitting: Adult Health

## 2014-07-14 VITALS — BP 148/84 | HR 83 | Ht 70.0 in | Wt 136.0 lb

## 2014-07-14 DIAGNOSIS — I25118 Atherosclerotic heart disease of native coronary artery with other forms of angina pectoris: Secondary | ICD-10-CM | POA: Diagnosis not present

## 2014-07-14 NOTE — Progress Notes (Deleted)
Name: Tiffany [name]    DOB: 10/23/1940  Age: 74 y.o.  MR#: [id number]       PCP:  Cleda Mccreedy, MD      Insurance: Payor: [employer] MEDICARE / Plan: [facility] MEDICARE / Product Type: *No Product type* /   CC:   No chief complaint on file.   VS Filed Vitals:   07/14/14 1449  BP: 148/84  Pulse: 83  Height: 5\' 10"  (1.778 m)  Weight: 136 lb (61.689 kg)  SpO2: 99%    Weights Current Weight  07/14/14 136 lb (61.689 kg)  07/13/14 135 lb (61.236 kg)  11/02/13 138 lb (62.596 kg)    Blood Pressure  BP Readings from Last 3 Encounters:  07/14/14 148/84  07/13/14 155/80  11/02/13 124/64     Admit date:  (Not on file) Last encounter with RMR:  07/07/2014   Allergy Florinef; Iohexol; Sulfonamide derivatives; and Tape  Current Outpatient Prescriptions  Medication Sig Dispense Refill  . albuterol (PROVENTIL HFA;VENTOLIN HFA) 108 (90 BASE) MCG/ACT inhaler Inhale 2 puffs into the lungs every 6 (six) hours as needed for wheezing or shortness of breath. 1 Inhaler 2  . ALPRAZolam (XANAX) 0.5 MG tablet Take 0.5 mg by mouth 3 (three) times daily.     . Artificial Tear Solution (SOOTHE XP OP) Apply 1 drop to eye 2 (two) times daily.    Marland Kitchen aspirin EC 81 MG tablet Take 81 mg by mouth at bedtime.     Marland Kitchen atorvastatin (LIPITOR) 40 MG tablet Take 1 tablet (40 mg total) by mouth at bedtime. 30 tablet 6  . Cholecalciferol (VITAMIN D) 400 UNITS capsule Take 400 Units by mouth every evening.     . clobetasol cream (TEMOVATE) 3.21 % Apply 1 application topically 2 (two) times daily.    . Cyanocobalamin (B-12 PO) Take 1 tablet by mouth daily.    Marland Kitchen docusate sodium ([employer] STOOL SOFTENER) 100 MG capsule Take 100 mg by mouth at bedtime.    . fludrocortisone (FLORINEF) 0.1 MG tablet Take 0.1 mg by mouth daily.    . folic acid (FOLVITE) 1 MG tablet Take 1 mg by mouth every morning.     . furosemide (LASIX) 20 MG tablet TAKE 1 TABLET (20 MG TOTAL) BY MOUTH EVERY OTHER DAY AS NEEDED FOR EDEMA 30 tablet 3  .  HYDROcodone-acetaminophen (NORCO/VICODIN) 5-325 MG per tablet Take 1 tablet by mouth 3 (three) times daily.     . hydroxychloroquine (PLAQUENIL) 200 MG tablet Take 400 mg by mouth daily. To be taken with food or milk    . iron polysaccharides (NIFEREX) 150 MG capsule Take 150 mg by mouth every morning.     Marland Kitchen KLOR-CON 10 10 MEQ tablet Take 10 mEq by mouth 2 (two) times daily.     Marland Kitchen levothyroxine (SYNTHROID, LEVOTHROID) 25 MCG tablet Take 25 mcg by mouth every morning.     . nitroGLYCERIN (NITROSTAT) 0.4 MG SL tablet Place 1 tablet (0.4 mg total) under the tongue every 5 (five) minutes as needed. 100 tablet 6  . omeprazole (PRILOSEC) 20 MG capsule Take 20 mg by mouth 2 (two) times daily.     . raloxifene (EVISTA) 60 MG tablet Take 60 mg by mouth every morning.     . sodium bicarbonate 325 MG tablet Take 650 mg by mouth 3 (three) times daily.     . Travoprost, BAK Free, (TRAVATAN) 0.004 % SOLN ophthalmic solution Place 1 drop into both eyes at bedtime.  No current facility-administered medications for this visit.    Discontinued Meds:    Medications Discontinued During This Encounter  Medication Reason  . Multiple Vitamins-Minerals (PRESERVISION/LUTEIN) CAPS Error    Patient Active Problem List   Diagnosis Date Noted  . Atypical chest pain 07/13/2014  . DOE (dyspnea on exertion) 10/22/2013  . CHF, acute 10/22/2013  . Systemic lupus erythematosus   . Coronary artery disease   . Palpitations 04/21/2013  . Chronic anticoagulation 01/29/2012  . Pulmonary embolism 11/28/2011  . Chest pain 09/06/2011  . Anemia 05/24/2011  . Chronic kidney disease 05/24/2011  . Hypertension   . Hyperlipidemia   . Arteriosclerotic cardiovascular disease (ASCVD)   . Sjogren's syndrome   . Tobacco abuse 08/08/2010  . WEIGHT LOSS, ABNORMAL 07/05/2010  . Orthostatic hypotension 01/24/2010  . SYNCOPE 08/28/2009  . LYMPHOMA 10/31/2008    LABS    Component Value Date/Time   NA 140 07/13/2014 1639    NA 148* 02/02/2014 0914   NA 137 10/24/2013 0615   K 4.8 07/13/2014 1639   K 4.3 02/02/2014 0914   K 4.0 10/24/2013 0615   CL 109 07/13/2014 1639   CL 116* 02/02/2014 0914   CL 104 10/24/2013 0615   CO2 25 07/13/2014 1639   CO2 24 02/02/2014 0914   CO2 20 10/24/2013 0615   GLUCOSE 82 07/13/2014 1639   GLUCOSE 76 02/02/2014 0914   GLUCOSE 97 10/24/2013 0615   BUN 23 07/13/2014 1639   BUN 20 02/02/2014 0914   BUN 26* 10/24/2013 0615   CREATININE 1.68* 07/13/2014 1639   CREATININE 1.73* 02/02/2014 0914   CREATININE 1.60* 10/24/2013 0615   CREATININE 1.51* 10/22/2013 1808   CREATININE 1.72* 06/01/2012 1413   CREATININE 1.87* 05/08/2012 1440   CALCIUM 9.1 07/13/2014 1639   CALCIUM 9.3 02/02/2014 0914   CALCIUM 9.0 10/24/2013 0615   GFRNONAA 29* 07/13/2014 1639   GFRNONAA 31* 10/24/2013 0615   GFRNONAA 34* 10/22/2013 1808   GFRAA 34* 07/13/2014 1639   GFRAA 36* 10/24/2013 0615   GFRAA 39* 10/22/2013 1808   CMP     Component Value Date/Time   NA 140 07/13/2014 1639   K 4.8 07/13/2014 1639   CL 109 07/13/2014 1639   CO2 25 07/13/2014 1639   GLUCOSE 82 07/13/2014 1639   BUN 23 07/13/2014 1639   CREATININE 1.68* 07/13/2014 1639   CREATININE 1.73* 02/02/2014 0914   CALCIUM 9.1 07/13/2014 1639   PROT 7.6 07/13/2014 1639   ALBUMIN 3.5 07/13/2014 1639   AST 23 07/13/2014 1639   ALT 17 07/13/2014 1639   ALKPHOS 60 07/13/2014 1639   BILITOT 0.3 07/13/2014 1639   GFRNONAA 29* 07/13/2014 1639   GFRAA 34* 07/13/2014 1639       Component Value Date/Time   WBC 5.4 07/13/2014 1639   WBC 5.8 10/22/2013 1808   WBC 5.1 04/07/2013 1909   HGB 11.3* 07/13/2014 1639   HGB 11.6* 10/22/2013 1808   HGB 11.2* 04/07/2013 1909   HCT 36.2 07/13/2014 1639   HCT 35.6* 10/22/2013 1808   HCT 34.8* 04/07/2013 1909   MCV 97.8 07/13/2014 1639   MCV 94.9 10/22/2013 1808   MCV 95.6 04/07/2013 1909    Lipid Panel     Component Value Date/Time   CHOL 152 05/15/2011 0000   TRIG 61  05/15/2011 0000   HDL 58 05/15/2011 0000   CHOLHDL 2.6 05/15/2011 0000   VLDL 12 05/15/2011 0000   LDLCALC 82 05/15/2011 0000    ABG  No results found for: PHART, PCO2ART, PO2ART, HCO3, TCO2, ACIDBASEDEF, O2SAT   Lab Results  Component Value Date   TSH 4.193 07/17/2010   BNP (last 3 results)  Recent Labs  07/13/14 1639  BNP 103.0*    ProBNP (last 3 results)  Recent Labs  10/22/13 1808  PROBNP 5385.0*    Cardiac Panel (last 3 results)  Recent Labs  07/13/14 1639  TROPONINI <0.03    Iron/TIBC/Ferritin/ %Sat    Component Value Date/Time   IRON 48 05/24/2011 1211   TIBC 336 05/24/2011 1211   FERRITIN 16 05/24/2011 1211   IRONPCTSAT 14* 05/24/2011 1211   IRONPCTSAT 37 08/30/2009 1405     EKG Orders placed or performed during the hospital encounter of 07/13/14  . EKG 12-Lead  . EKG 12-Lead  . ED EKG  . ED EKG     Prior Assessment and Plan Problem List as of 07/14/2014      Cardiovascular and Mediastinum   Orthostatic hypotension   Last Assessment & Plan 04/21/2013 Office Visit Written 04/21/2013  1:28 PM by Lendon Colonel, NP    I have explained to her that she does not tolerate a lower more normal BP as this causes significant symptoms of dizziness and near syncope. She will continue on florinef 0.1 mg daily. She does not wish to take it at night. I have reviewed labs and found them to be WNL from ER.       SYNCOPE   Last Assessment & Plan 06/01/2012 Office Visit Written 06/01/2012  2:39 PM by Lendon Colonel, NP    No further complaints of syncope, dizziness or near syncope.      Hypertension   Last Assessment & Plan 11/02/2013 Office Visit Written 11/02/2013  1:29 PM by Lendon Colonel, NP    Blood pressure is currently very well-controlled. She has occasional dizziness when she stands or goes from a lying position to a standing position. She is taking her time when getting up. I reviewed her echo results revealing an EF of 45 percent. She is on  Lasix every other day. I have asked her to keep track of her blood pressure, if her blood pressure decreases to less than 8:65 systolic she is to hold her Lasix that day. I will repeat her echocardiogram in a few months to evaluate LV function improvement. We may be able to get her off the Lasix altogether. Followup BMET will be completed in 3 months as well to evaluate for kidney function and sodium.      Arteriosclerotic cardiovascular disease (ASCVD)   Last Assessment & Plan 06/01/2012 Office Visit Written 06/01/2012  2:39 PM by Lendon Colonel, NP    Other than frequent palpitations, she is without complaint. No chest pain or significiant DOE.      Pulmonary embolism   Last Assessment & Plan 11/28/2011 Office Visit Written 11/28/2011  4:09 PM by Lendon Colonel, NP    She is currently on Xarelto but has not started this yet. Will begin today. I will check CBC and BMET for follow-up labs with history of anemia and CKD on this medication. Hemoccult cards will be provided.        Coronary artery disease   Last Assessment & Plan 11/02/2013 Office Visit Written 11/02/2013  1:31 PM by Lendon Colonel, NP    She remained stable. She will remain on aspirin statin. She is not on a beta blocker in the setting of emphysema and bronchospasms associated. Blood  pressure is currently well-controlled.      CHF, acute     Digestive   Sjogren's syndrome     Genitourinary   Chronic kidney disease   Last Assessment & Plan 04/16/2012 Office Visit Written 04/16/2012  4:33 PM by Lendon Colonel, NP    Repeat labs in 2 weeks for ongoing assessment.        Other   LYMPHOMA   WEIGHT LOSS, ABNORMAL   Last Assessment & Plan 05/24/2011 Office Visit Written 05/24/2011  1:01 PM by Yehuda Savannah, MD    Weight has been stable since her last visit.      Tobacco abuse   Hyperlipidemia   Last Assessment & Plan 05/24/2011 Office Visit Written 05/24/2011 12:56 PM by Yehuda Savannah, MD    Lipid control  was excellent when last assessed one month ago.  Current therapy will be continued.      Anemia   Last Assessment & Plan 05/24/2011 Office Visit Written 05/24/2011 12:53 PM by Yehuda Savannah, MD    Patient has had long-standing anemia, present at this level or worse for at least the past few years.  Iron studies obtained in 2011 were nondiagnostic with serum iron of 67, TIBC of 179, saturation 37% and a ferritin of 56, subsequently measured at 93.  She reports colonoscopy approximately 3-4 years ago with negative findings.  Anemia appears out of portion to her connective tissue problems.  Stool for Hemoccult testing will be obtained.  TSH was normal in 2012 as was cortisol in 2011.  She has never been seen by a hematologist and will address a referral at her next visit with her PCP.      Chest pain   Last Assessment & Plan 11/02/2013 Office Visit Written 11/02/2013  1:30 PM by Lendon Colonel, NP    Completely resolved. She has had no recurrence with or without exertion.      Chronic anticoagulation   Palpitations   Last Assessment & Plan 04/21/2013 Office Visit Written 04/21/2013  1:31 PM by Lendon Colonel, NP    Review of EKG from ER and from today's office visit shows that she is having PVC's. No couplets or bigeminy. They appear to be benign. Reassurance is given. Will see her in 6 months. She is advised to decrease caffeine as she drinks several Dr. Samson Frederic a day.      Systemic lupus erythematosus   DOE (dyspnea on exertion)   Last Assessment & Plan 11/02/2013 Office Visit Written 11/02/2013  1:32 PM by Lendon Colonel, NP    This is chronic for her in the setting of emphysema. She is not very active. States it is not a lot during the summer and winter. She will continue bronchodilators as directed, and followup with primary care as needed.      Atypical chest pain       Imaging: Dg Chest 2 View  07/13/2014   CLINICAL DATA:  Chest pain radiating to the back of the neck.  Headache for 3 days. No shortness of breath or cough.  EXAM: CHEST  2 VIEW  COMPARISON:  10/24/2013.  FINDINGS: Emphysema is present. Bilateral pleural apical scarring is chronic. The cardiopericardial silhouette is within normal limits. Aortic arch atherosclerosis. Thoracic spine DISH. Cholecystectomy clips are present in the right upper quadrant.  IMPRESSION: Emphysema without acute cardiopulmonary disease.   Electronically Signed   By: Dereck Ligas M.D.   On: 07/13/2014 16:20

## 2014-07-14 NOTE — Patient Instructions (Signed)
Your physician recommends that you schedule a follow-up appointment in: [place] physician recommends that you continue on your current medications as directed. Please refer to the Current Medication list given to you today.  Your physician has requested that you have a carotid duplex. This test is an ultrasound of the carotid arteries in your neck. It looks at blood flow through these arteries that supply the brain with blood. Allow one hour for this exam. There are no restrictions or special instructions.  Thank you for choosing [facility]!!

## 2014-07-14 NOTE — Progress Notes (Signed)
Cardiology Office Note   Date:  07/14/2014   ID:  Tiffany [name], DOB December 13, 1940, MRN [id number]  PCP:  Cleda Mccreedy, MD  Cardiologist: Woodroe Chen, NP   Chief Complaint  Patient presents with  . Coronary Artery Disease  . Cardiomyopathy      History of Present Illness: Tiffany [name] is a 74 y.o. female who presents for Ongoing assessment and management of CAD, with recurrent chest pain.  Other history includes hypercholesterolemia, and ongoing tobacco abuse.  Most recent EF was 40-45% in June of 2015.  She was in the emergency room last with chest pressure.  She states that she often has chest pressure, which becomes anxious or worried.  She states that her granddaughter had an ectopic pregnancy , which had to be terminated, and she had a significant emotional reaction to this.  Every time she thinks about her chest begins to feel heavy.  She usually takes a nitroglycerin for this.  She unfortunately continues to smoke.  She has been on Xanax 0.5 mg daily from her primary care physician, and states it is not helping.  She also has been on multiple medications to help her to sleep, which she states are not helping either.   Past Medical History  Diagnosis Date  . Lymphoma   . Systemic lupus erythematosus   . Depression   . Hyperlipidemia   . 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 Sjogren's syndrome   . Orthostatic hypotension   . Dysphagia   . Syncope   . Tobacco abuse   . Weight loss   . Anemia   . Hypertension     hypotension  . Chronic anticoagulation 01/29/2012  . Chronic kidney disease   . 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 Thyroid disease   . Coronary artery disease     MI  . COPD (chronic obstructive pulmonary disease)     Past Surgical History  Procedure Laterality Date  . Breast excisional biopsy      Left  . Eye surgery    . Mass excision     Lymphoma  . Coronary angioplasty with stent placement    . Cholecystectomy    . Cataract extraction Bilateral      Current Outpatient Prescriptions  Medication Sig Dispense Refill  . albuterol (PROVENTIL HFA;VENTOLIN HFA) 108 (90 BASE) MCG/ACT inhaler Inhale 2 puffs into the lungs every 6 (six) hours as needed for wheezing or shortness of breath. 1 Inhaler 2  . ALPRAZolam (XANAX) 0.5 MG tablet Take 0.5 mg by mouth 3 (three) times daily.     . Artificial Tear Solution (SOOTHE XP OP) Apply 1 drop to eye 2 (two) times daily.    Marland Kitchen aspirin EC 81 MG tablet Take 81 mg by mouth at bedtime.     Marland Kitchen atorvastatin (LIPITOR) 40 MG tablet Take 1 tablet (40 mg total) by mouth at bedtime. 30 tablet 6  . Cholecalciferol (VITAMIN D) 400 UNITS capsule Take 400 Units by mouth every evening.     . clobetasol cream (TEMOVATE) 2.59 % Apply 1 application topically 2 (two) times daily.    . Cyanocobalamin (B-12 PO) Take 1 tablet by mouth daily.    Marland Kitchen docusate sodium ([employer]S STOOL SOFTENER) 100 MG capsule Take 100 mg by mouth at bedtime.    . fludrocortisone (FLORINEF) 0.1 MG tablet Take  0.1 mg by mouth daily.    . folic acid (FOLVITE) 1 MG tablet Take 1 mg by mouth every morning.     . furosemide (LASIX) 20 MG tablet TAKE 1 TABLET (20 MG TOTAL) BY MOUTH EVERY OTHER DAY AS NEEDED FOR EDEMA 30 tablet 3  . HYDROcodone-acetaminophen (NORCO/VICODIN) 5-325 MG per tablet Take 1 tablet by mouth 3 (three) times daily.     . hydroxychloroquine (PLAQUENIL) 200 MG tablet Take 400 mg by mouth daily. To be taken with food or milk    . iron polysaccharides (NIFEREX) 150 MG capsule Take 150 mg by mouth every morning.     Marland Kitchen KLOR-CON 10 10 MEQ tablet Take 10 mEq by mouth 2 (two) times daily.     Marland Kitchen levothyroxine (SYNTHROID, LEVOTHROID) 25 MCG tablet Take 25 mcg by mouth every morning.     . nitroGLYCERIN (NITROSTAT) 0.4 MG SL tablet Place 1 tablet (0.4 mg total) under the tongue every 5 (five) minutes as needed. 100 tablet 6  . omeprazole  (PRILOSEC) 20 MG capsule Take 20 mg by mouth 2 (two) times daily.     . raloxifene (EVISTA) 60 MG tablet Take 60 mg by mouth every morning.     . sodium bicarbonate 325 MG tablet Take 650 mg by mouth 3 (three) times daily.     . Travoprost, BAK Free, (TRAVATAN) 0.004 % SOLN ophthalmic solution Place 1 drop into both eyes at bedtime.     No current facility-administered medications for this visit.    Allergies:   Florinef; Iohexol; Sulfonamide derivatives; and Tape    Social History:  The patient  reports that she has been smoking Cigarettes.  She started smoking about 28 years ago. She has a 5.75 pack-year smoking history. She has never used smokeless tobacco. She reports that she does not drink alcohol or use illicit drugs.   Family History:  The patient's family history includes Cancer in her brother and sister; Coronary artery disease in her father and mother; Liver disease in her mother.    ROS: .   All other systems are reviewed and negative.Unless otherwise mentioned in  H&P above.   PHYSICAL EXAM: VS:  BP 148/84 mmHg  Pulse 83  Ht 5\' 10"  (1.778 m)  Wt 61.689 kg (136 lb)  BMI 19.51 kg/m2  SpO2 99% , BMI Body mass index is 19.51 kg/(m^2). GEN: Well nourished, well developed, in no acute distress HEENT: normal Neck: no JVD, carotid bruits, or masses Cardiac: RRR; no murmurs, rubs, or gallops,no edema  Respiratory:  clear to auscultation bilaterally, normal work of breathing GI: soft, nontender, nondistended, + BS MS: no deformity or atrophy Skin: warm and dry, no rash Neuro:  Strength and sensation are intact Psych: euthymic mood, full affect   Recent Labs: 10/22/2013: Pro B Natriuretic peptide (BNP) 5385.0* 07/13/2014: ALT 17; B Natriuretic Peptide 103.0*; BUN 23; Creatinine 1.68*; Hemoglobin 11.3*; Platelets 169; Potassium 4.8; Sodium 140    Lipid Panel    Component Value Date/Time   CHOL 152 05/15/2011 0000   TRIG 61 05/15/2011 0000   HDL 58 05/15/2011 0000    CHOLHDL 2.6 05/15/2011 0000   VLDL 12 05/15/2011 0000   LDLCALC 82 05/15/2011 0000      Wt Readings from Last 3 Encounters:  07/14/14 61.689 kg (136 lb)  07/13/14 61.236 kg (135 lb)  11/02/13 62.596 kg (138 lb)      ASSESSMENT AND PLAN:  1. Chest discomfort: I believe this is anxiety induced, related  to emotional upset from recent family stress.  She continues to smoke, and requests different medications for anxiety and sleep.  I advised her to talk with her PCP about referral to psychiatry.  I feel that it would be better resources concerning anxiety and insomnia medications, as cardiology will not be managing this.  She verbalizes understanding.  2. CAD: The patient has a history of drug-eluting stent to the right coronary artery with most recent cardiac catheterization in 2007 with patent stents.  If she continues to have recurrent chest pressure associated with anxiety, and has to take recurrent, nitroglycerin, will consider repeating her stress test to evaluate for worsening or progressive CAD.with her continued smoking.  It is likely that she will have some progressive disease. I would check carotid artery Dopplers, and she is having frequent headaches.  She is advised not to use nitroglycerin when she becomes anxious.  She does get a lot of headaches associated with frequent use.  Blood pressure is currently well-controlled.  We will not make any medication changes at this time.  I will see her again in 3 months unless she continues to be symptomatic at which time.  I will repeat her stress test.  3. Tobacco abuse: She has been advised on smoking cessation.  She does not seem inclined was to stop.      Orders Placed This Encounter  Procedures  . US Carotid Duplex Bilateral     Disposition:   FU with 3 months.  Signed, Jory Sims, NP  07/14/2014 3:23 PM    [facility] [address] [place], [place] [address] Phone: [phone]; Fax: [phone]

## 2014-07-15 ENCOUNTER — Ambulatory Visit: Payer: Medicare Other | Admitting: Adult Health

## 2014-07-15 ENCOUNTER — Ambulatory Visit (HOSPITAL_COMMUNITY)
Admission: RE | Admit: 2014-07-15 | Discharge: 2014-07-15 | Disposition: A | Discharge status: Home / Self Care | Payer: Medicare Other | Source: Ambulatory Visit | Attending: Adult Health | Admitting: Adult Health

## 2014-07-15 DIAGNOSIS — I25118 Atherosclerotic heart disease of native coronary artery with other forms of angina pectoris: Secondary | ICD-10-CM

## 2014-07-15 DIAGNOSIS — R51 Headache: Secondary | ICD-10-CM | POA: Insufficient documentation

## 2014-10-13 ENCOUNTER — Ambulatory Visit (INDEPENDENT_AMBULATORY_CARE_PROVIDER_SITE_OTHER): Payer: Medicare Other | Admitting: Cardiology

## 2014-10-13 ENCOUNTER — Encounter: Payer: Self-pay | Admitting: Cardiology

## 2014-10-13 VITALS — BP 116/58 | HR 55 | Ht 70.0 in | Wt 142.0 lb

## 2014-10-13 DIAGNOSIS — E782 Mixed hyperlipidemia: Secondary | ICD-10-CM | POA: Diagnosis not present

## 2014-10-13 DIAGNOSIS — I2510 Atherosclerotic heart disease of native coronary artery without angina pectoris: Secondary | ICD-10-CM | POA: Diagnosis not present

## 2014-10-13 DIAGNOSIS — I951 Orthostatic hypotension: Secondary | ICD-10-CM | POA: Diagnosis not present

## 2014-10-13 NOTE — Progress Notes (Signed)
Cardiology Office Note  Date: 10/13/2014   ID: Tiffany [name], DOB May 12, 1941, MRN [id number]  PCP: Cleda Mccreedy, MD  Primary Cardiologist: Rozann Lesches, MD   Chief Complaint  Patient presents with  . Coronary Artery Disease    History of Present Illness: Tiffany [name] is a 74 y.o. female presenting for a routine visit. This is my first meeting with her in the office, she last saw Tiffany [name] in March of this year, and was a former patient of Dr. Lattie Haw in 2013. Cardiac history is noted below. She is here today with her daughter. From a cardiac perspective, she denies any significant angina or nitroglycerin use. States that she is not particularly active, does enjoy reading. She reports compliance with her medications which are outlined below.  Last ischemic evaluation was approximately 3 years ago, overall low risk. Earlier this year she had carotid Dopplers which did not demonstrate any obstructive internal carotid artery stenosis.  She reports regular lipid follow-up with Tiffany [name]. We plan to request her most recent lab work.  ECG from March of this year showed normal sinus rhythm.   Past Medical History  Diagnosis Date  . Lymphoma   . Systemic lupus erythematosus   . Depression   . Hyperlipidemia   . CAD (coronary artery disease)     DES to mid RCA 2006  . Sjogren's syndrome   . Orthostatic hypotension   . Dysphagia   . Syncope   . Anemia   . CKD (chronic kidney disease) stage 3, GFR 30-59 ml/min   . COPD (chronic obstructive pulmonary disease)     Past Surgical History  Procedure Laterality Date  . Breast excisional biopsy Left   . Eye surgery    . Mass excision      Lymphoma  . Coronary angioplasty with stent placement    . Cholecystectomy    . Cataract extraction Bilateral     Current Outpatient Prescriptions  Medication Sig Dispense Refill  . albuterol (PROVENTIL HFA;VENTOLIN HFA) 108 (90 BASE) MCG/ACT inhaler Inhale 2 puffs into the lungs  every 6 (six) hours as needed for wheezing or shortness of breath. 1 Inhaler 2  . ALPRAZolam (XANAX) 0.5 MG tablet Take 0.5 mg by mouth 3 (three) times daily.     Marland Kitchen amitriptyline (ELAVIL) 100 MG tablet Take 100 mg by mouth daily.  0  . Artificial Tear Solution (SOOTHE XP OP) Apply 1 drop to eye 2 (two) times daily.    Marland Kitchen aspirin EC 81 MG tablet Take 81 mg by mouth at bedtime.     Marland Kitchen atorvastatin (LIPITOR) 40 MG tablet Take 1 tablet (40 mg total) by mouth at bedtime. 30 tablet 6  . Cholecalciferol (VITAMIN D) 400 UNITS capsule Take 400 Units by mouth every evening.     . clobetasol cream (TEMOVATE) 1.93 % Apply 1 application topically 2 (two) times daily.    . Cyanocobalamin (B-12 PO) Take 1 tablet by mouth daily.    Marland Kitchen docusate sodium ([employer] STOOL SOFTENER) 100 MG capsule Take 100 mg by mouth at bedtime.    . fludrocortisone (FLORINEF) 0.1 MG tablet Take 0.1 mg by mouth daily.    . folic acid (FOLVITE) 1 MG tablet Take 1 mg by mouth every morning.     . furosemide (LASIX) 20 MG tablet TAKE 1 TABLET (20 MG TOTAL) BY MOUTH EVERY OTHER DAY AS NEEDED FOR EDEMA 30 tablet 3  . HYDROcodone-acetaminophen (NORCO/VICODIN) 5-325 MG per tablet Take 1  tablet by mouth 3 (three) times daily.     . hydroxychloroquine (PLAQUENIL) 200 MG tablet Take 400 mg by mouth daily. To be taken with food or milk    . iron polysaccharides (NIFEREX) 150 MG capsule Take 150 mg by mouth every morning.     Marland Kitchen KLOR-CON 10 10 MEQ tablet Take 10 mEq by mouth 2 (two) times daily.     Marland Kitchen levothyroxine (SYNTHROID, LEVOTHROID) 25 MCG tablet Take 25 mcg by mouth every morning.     . nitroGLYCERIN (NITROSTAT) 0.4 MG SL tablet Place 1 tablet (0.4 mg total) under the tongue every 5 (five) minutes as needed. 100 tablet 6  . omeprazole (PRILOSEC) 20 MG capsule Take 20 mg by mouth 2 (two) times daily.     . raloxifene (EVISTA) 60 MG tablet Take 60 mg by mouth every morning.     . sodium bicarbonate 325 MG tablet Take 650 mg by mouth 3 (three) times  daily.     . Travoprost, BAK Free, (TRAVATAN) 0.004 % SOLN ophthalmic solution Place 1 drop into both eyes at bedtime.     No current facility-administered medications for this visit.    Allergies:  Florinef; Iohexol; Sulfonamide derivatives; and Tape   Social History: The patient  reports that she has been smoking Cigarettes.  She started smoking about 28 years ago. She has a 5.75 pack-year smoking history. She has never used smokeless tobacco. She reports that she does not drink alcohol or use illicit drugs.   ROS:  Please see the history of present illness. Otherwise, complete review of systems is positive for none.  All other systems are reviewed and negative.   Physical Exam: VS:  BP 116/58 mmHg  Pulse 55  Ht 5\' 10"  (1.778 m)  Wt 142 lb (64.411 kg)  BMI 20.37 kg/m2  SpO2 95%, BMI Body mass index is 20.37 kg/(m^2).  Wt Readings from Last 3 Encounters:  10/13/14 142 lb (64.411 kg)  07/14/14 136 lb (61.689 kg)  07/13/14 135 lb (61.236 kg)     General: Patient appears comfortable at rest. HEENT: Conjunctiva and lids normal, oropharynx clear. Neck: Supple, no elevated JVP or carotid bruits, no thyromegaly. Lungs: Clear to auscultation, nonlabored breathing at rest. Cardiac: Regular rate and rhythm, no S3 or significant systolic murmur, no pericardial rub. Abdomen: Soft, nontender, bowel sounds present. Extremities: No pitting edema, distal pulses 2+. Skin: Warm and dry. Musculoskeletal: No kyphosis. Neuropsychiatric: Alert and oriented x3, affect grossly appropriate.   ECG: ECG is not ordered today.  Recent Labwork: 10/22/2013: Pro B Natriuretic peptide (BNP) 5385.0* 07/13/2014: ALT 17; AST 23; B Natriuretic Peptide 103.0*; BUN 23; Creatinine 1.68*; Hemoglobin 11.3*; Platelets 169; Potassium 4.8; Sodium 140     Component Value Date/Time   CHOL 152 05/15/2011 0000   TRIG 61 05/15/2011 0000   HDL 58 05/15/2011 0000   CHOLHDL 2.6 05/15/2011 0000   VLDL 12 05/15/2011 0000    LDLCALC 82 05/15/2011 0000    Other Studies Reviewed Today:  1. Carotid Dopplers 07/15/2014: IMPRESSION: Mild-moderate atherosclerotic plaque in the carotid arteries bilaterally. Estimated degree of stenosis in the internal carotid arteries is less than 50% bilaterally.  2. Lexiscan Cardiolite 05/15/2011: Scintigraphic Data: Acquisition notable for mild to moderate breast attenuation. Intense GI activity was noted adjacent to the inferoapical myocardial segments. The left ventricle was mildly dilated. On tomographic images reconstructed in standard planes, there was a small defect of moderate intensity involving the inferior and inferoseptal segments. A very small defect  of borderline numeric significance was present in the distal and apical septum. By comparison to the resting images, no reversibility was noted in either of these regions. The gated reconstruction demonstrated septal hypokinesis with minimally depressed overall left ventricular systolic function and normal systolic accentuation of activity throughout. Estimated ejection fraction was 49%.  IMPRESSION: Borderline pharmacologic stress nuclear myocardial study revealing no significant stress-induced EKG abnormalities, mild left ventricular enlargement and borderline low left ventricular systolic function. By scintigraphic imaging there were areas of apparent hypoperfusion likely representing breast attenuation artifact. A small degree of scarring in the inferior and inferoseptal segments cannot be unequivocally excluded. No evidence for myocardial ischemia. Other findings as noted.   ASSESSMENT AND PLAN:  1. Symptomatically stable CAD status post DES to the RCA in 2006. Plan is to continue medical therapy and observation.  2. Hyperlipidemia, continues on Lipitor. We will request most recent lab work from Tiffany [name].  3. History of orthostatic hypotension, blood pressure stable today and she has recently  been asymptomatic.  Current medicines were reviewed at length with the patient today.   Disposition: FU with me in 6 months.   Signed, Satira Sark, MD, [facility] 10/13/2014 10:25 AM    [place] [facility] [place]. [address] [place], [place] [address] Phone: [phone]; Fax: [phone]

## 2014-10-13 NOTE — Patient Instructions (Signed)
Your physician wants you to follow-up in:  6 months with Dr. McDowell. You will receive a reminder letter in the mail two months in advance. If you don't receive a letter, please call our office to schedule the follow-up appointment.  Your physician recommends that you continue on your current medications as directed. Please refer to the Current Medication list given to you today.  Thanks for choosing [facility]!!!    

## 2014-11-03 ENCOUNTER — Emergency Department (HOSPITAL_COMMUNITY)
Admission: EM | Admit: 2014-11-03 | Discharge: 2014-11-03 | Disposition: A | Discharge status: Home / Self Care | Payer: Medicare Other | Attending: Emergency Medicine | Admitting: Emergency Medicine

## 2014-11-03 ENCOUNTER — Telehealth: Payer: Self-pay

## 2014-11-03 ENCOUNTER — Encounter (HOSPITAL_COMMUNITY): Payer: Self-pay | Admitting: Emergency Medicine

## 2014-11-03 DIAGNOSIS — N183 Chronic kidney disease, stage 3 (moderate): Secondary | ICD-10-CM | POA: Insufficient documentation

## 2014-11-03 DIAGNOSIS — D649 Anemia, unspecified: Secondary | ICD-10-CM | POA: Diagnosis not present

## 2014-11-03 DIAGNOSIS — E785 Hyperlipidemia, unspecified: Secondary | ICD-10-CM | POA: Insufficient documentation

## 2014-11-03 DIAGNOSIS — Z8739 Personal history of other diseases of the musculoskeletal system and connective tissue: Secondary | ICD-10-CM | POA: Insufficient documentation

## 2014-11-03 DIAGNOSIS — Z79899 Other long term (current) drug therapy: Secondary | ICD-10-CM | POA: Insufficient documentation

## 2014-11-03 DIAGNOSIS — I959 Hypotension, unspecified: Secondary | ICD-10-CM | POA: Diagnosis present

## 2014-11-03 DIAGNOSIS — I2510 Atherosclerotic heart disease of native coronary artery without angina pectoris: Secondary | ICD-10-CM | POA: Diagnosis not present

## 2014-11-03 DIAGNOSIS — Z720 Tobacco use: Secondary | ICD-10-CM | POA: Diagnosis not present

## 2014-11-03 DIAGNOSIS — J449 Chronic obstructive pulmonary disease, unspecified: Secondary | ICD-10-CM | POA: Diagnosis not present

## 2014-11-03 DIAGNOSIS — Z8579 Personal history of other malignant neoplasms of lymphoid, hematopoietic and related tissues: Secondary | ICD-10-CM | POA: Diagnosis not present

## 2014-11-03 DIAGNOSIS — N390 Urinary tract infection, site not specified: Secondary | ICD-10-CM | POA: Diagnosis not present

## 2014-11-03 DIAGNOSIS — R5383 Other fatigue: Secondary | ICD-10-CM

## 2014-11-03 DIAGNOSIS — Z7982 Long term (current) use of aspirin: Secondary | ICD-10-CM | POA: Insufficient documentation

## 2014-11-03 DIAGNOSIS — E860 Dehydration: Secondary | ICD-10-CM | POA: Diagnosis not present

## 2014-11-03 DIAGNOSIS — Z9861 Coronary angioplasty status: Secondary | ICD-10-CM | POA: Diagnosis not present

## 2014-11-03 LAB — TROPONIN I: Troponin I: <0.03 ng/mL (ref <0.031)

## 2014-11-03 LAB — BASIC METABOLIC PANEL
Anion gap: 6 (ref 5–15)
BUN: 23 mg/dL — ABNORMAL HIGH (ref 6–20)
CO2: 26 mmol/L (ref 22–32)
Calcium: 9.2 mg/dL (ref 8.9–10.3)
Chloride: 107 mmol/L (ref 101–111)
Creatinine, Ser: 2.06 mg/dL — ABNORMAL HIGH (ref 0.44–1.00)
GFR calc Af Amer: 27 mL/min — ABNORMAL LOW (ref >60)
GFR calc non Af Amer: 23 mL/min — ABNORMAL LOW (ref >60)
Glucose, Bld: 91 mg/dL (ref 65–99)
Potassium: 4 mmol/L (ref 3.5–5.1)
Sodium: 139 mmol/L (ref 135–145)

## 2014-11-03 LAB — URINE MICROSCOPIC-ADD ON

## 2014-11-03 LAB — CBC WITH DIFFERENTIAL/PLATELET
Basophils Absolute: 0 10*3/uL (ref 0.0–0.1)
Basophils Relative: 1 % (ref 0–1)
Eosinophils Absolute: 0.3 10*3/uL (ref 0.0–0.7)
Eosinophils Relative: 6 % — ABNORMAL HIGH (ref 0–5)
HCT: 32.3 % — ABNORMAL LOW (ref 36.0–46.0)
Hemoglobin: 10.1 g/dL — ABNORMAL LOW (ref 12.0–15.0)
Lymphocytes Relative: 23 % (ref 12–46)
Lymphs Abs: 1 10*3/uL (ref 0.7–4.0)
MCH: 30.6 pg (ref 26.0–34.0)
MCHC: 31.3 g/dL (ref 30.0–36.0)
MCV: 97.9 fL (ref 78.0–100.0)
Monocytes Absolute: 0.6 10*3/uL (ref 0.1–1.0)
Monocytes Relative: 13 % — ABNORMAL HIGH (ref 3–12)
Neutro Abs: 2.5 10*3/uL (ref 1.7–7.7)
Neutrophils Relative %: 57 % (ref 43–77)
Platelets: 149 10*3/uL — ABNORMAL LOW (ref 150–400)
RBC: 3.3 MIL/uL — ABNORMAL LOW (ref 3.87–5.11)
RDW: 13.8 % (ref 11.5–15.5)
WBC: 4.3 10*3/uL (ref 4.0–10.5)

## 2014-11-03 LAB — URINALYSIS, ROUTINE W REFLEX MICROSCOPIC
Bilirubin Urine: NEGATIVE
Glucose, UA: NEGATIVE mg/dL
Hgb urine dipstick: NEGATIVE
Ketones, ur: NEGATIVE mg/dL
Nitrite: NEGATIVE
Specific Gravity, Urine: 1.02 (ref 1.005–1.030)
Urobilinogen, UA: 0.2 mg/dL (ref 0.0–1.0)
pH: 7 (ref 5.0–8.0)

## 2014-11-03 LAB — MAGNESIUM: Magnesium: 2.1 mg/dL (ref 1.7–2.4)

## 2014-11-03 MED ORDER — CEPHALEXIN 500 MG PO CAPS: 500.0000 mg | ORAL_CAPSULE | Freq: Three times a day (TID) | ORAL | Status: DC

## 2014-11-03 MED ORDER — SODIUM CHLORIDE 0.9 % IV BOLUS (SEPSIS)
500.0000 mL | Freq: Once | INTRAVENOUS | Status: AC
  Administered 2014-11-03: 500 mL via INTRAVENOUS

## 2014-11-03 MED ORDER — SODIUM CHLORIDE 0.9 % IV SOLN
INTRAVENOUS | Status: DC
  Administered 2014-11-03: 10:00:00 via INTRAVENOUS

## 2014-11-03 MED ORDER — DEXTROSE 5 % IV SOLN
1.0000 g | Freq: Once | INTRAVENOUS | Status: AC
  Administered 2014-11-03: 1 g via INTRAVENOUS
  Filled 2014-11-03: qty 10

## 2014-11-03 NOTE — Discharge Instructions (Signed)

## 2014-11-03 NOTE — Telephone Encounter (Signed)
Daughter in law called to say patient had BP 90/40 by another provider Tuesday and now patient has not eaten or drank nearly anything since and will only tell family she doesn't fel well.daughter in law says she is making a small amount of urine.i suggested she my need to go to the ED for IV hydration and lab tests.She will take her to ED this am

## 2014-11-03 NOTE — ED Notes (Signed)
BP yesterday at home was 90/46 and pt was weak.  Heart Md told pt to come to ER for labs and evaluation.

## 2014-11-03 NOTE — ED Provider Notes (Signed)
CSN: [id number]     Arrival date & time 11/03/14  0932 History  This chart was scribed for Virgel Manifold, MD by Eustaquio Maize, ED Scribe. This patient was seen in room APA01/APA01 and the patient's care was started at 9:53 AM.  Chief Complaint  Patient presents with  . Hypotension   The history is provided by the patient. No language interpreter was used.     HPI Comments: Tiffany [name] is a 74 y.o. female with hx HLD, CAD, and COPD who presents to the Emergency Department complaining of generalized weakness that began 2 days ago. Pt was seen by rheumatologist 2 days ago and was told her blood pressure was low. Family mentions that her BP was 90/46. Pt is having loss of appetite and back pain as well. Pt's sleep medication was recently changed in the past 3 months.Denies shortness of breath, chest pain, nausea, vomiting, melena, fever, or any other symptoms. No SOB. No nausea, vomiting, melena, or any other symptoms.    Past Medical History  Diagnosis Date  . Lymphoma   . Systemic lupus erythematosus   . Depression   . Hyperlipidemia   . CAD (coronary artery disease)     DES to mid RCA 2006  . Sjogren's syndrome   . Orthostatic hypotension   . Dysphagia   . Syncope   . Anemia   . CKD (chronic kidney disease) stage 3, GFR 30-59 ml/min   . COPD (chronic obstructive pulmonary disease)    Past Surgical History  Procedure Laterality Date  . Breast excisional biopsy Left   . Eye surgery    . Mass excision      Lymphoma  . Coronary angioplasty with stent placement    . Cholecystectomy    . Cataract extraction Bilateral    Family History  Problem Relation Age of Onset  . Coronary artery disease Father   . Coronary artery disease Mother   . Liver disease Mother   . Esophageal cancer Brother   . Breast cancer Sister    History  Substance Use Topics  . Smoking status: Current Every Day Smoker -- 0.25 packs/day for 23 years    Types: Cigarettes    Start date: 07/14/1986  .  Smokeless tobacco: Never Used  . Alcohol Use: No   OB History    Gravida Para Term Preterm AB TAB SAB Ectopic Multiple Living   3 3        3      Review of Systems  Constitutional: Positive for fatigue. Negative for fever and chills.  Respiratory: Negative for shortness of breath.   Cardiovascular: Negative for chest pain.  Gastrointestinal: Negative for nausea, vomiting and abdominal pain.  All other systems reviewed and are negative.     Allergies  Florinef; Iohexol; Sulfonamide derivatives; and Tape  Home Medications   Prior to Admission medications   Medication Sig Start Date End Date Taking? Authorizing Provider  albuterol (PROVENTIL HFA;VENTOLIN HFA) 108 (90 BASE) MCG/ACT inhaler Inhale 2 puffs into the lungs every 6 (six) hours as needed for wheezing or shortness of breath. 10/24/13   Nita Sells, MD  ALPRAZolam Duanne Moron) 0.5 MG tablet Take 0.5 mg by mouth 3 (three) times daily.     Historical Provider, MD  amitriptyline (ELAVIL) 100 MG tablet Take 100 mg by mouth daily. 07/21/14   Historical Provider, MD  Artificial Tear Solution (SOOTHE XP OP) Apply 1 drop to eye 2 (two) times daily.    Historical Provider, MD  aspirin  EC 81 MG tablet Take 81 mg by mouth at bedtime.     Historical Provider, MD  atorvastatin (LIPITOR) 40 MG tablet Take 1 tablet (40 mg total) by mouth at bedtime. 04/21/13   Lendon Colonel, NP  Cholecalciferol (VITAMIN D) 400 UNITS capsule Take 400 Units by mouth every evening.     Historical Provider, MD  clobetasol cream (TEMOVATE) 1.69 % Apply 1 application topically 2 (two) times daily.    Historical Provider, MD  Cyanocobalamin (B-12 PO) Take 1 tablet by mouth daily.    Historical Provider, MD  docusate sodium ([employer]S STOOL SOFTENER) 100 MG capsule Take 100 mg by mouth at bedtime.    Historical Provider, MD  fludrocortisone (FLORINEF) 0.1 MG tablet Take 0.1 mg by mouth daily.    Historical Provider, MD  folic acid (FOLVITE) 1 MG tablet Take 1 mg by  mouth every morning.     Historical Provider, MD  furosemide (LASIX) 20 MG tablet TAKE 1 TABLET (20 MG TOTAL) BY MOUTH EVERY OTHER DAY AS NEEDED FOR EDEMA 07/07/14   Lendon Colonel, NP  HYDROcodone-acetaminophen (NORCO/VICODIN) 5-325 MG per tablet Take 1 tablet by mouth 3 (three) times daily.     Historical Provider, MD  hydroxychloroquine (PLAQUENIL) 200 MG tablet Take 400 mg by mouth daily. To be taken with food or milk 02/07/13   Historical Provider, MD  iron polysaccharides (NIFEREX) 150 MG capsule Take 150 mg by mouth every morning.     Historical Provider, MD  KLOR-CON 10 10 MEQ tablet Take 10 mEq by mouth 2 (two) times daily.  03/15/13   Historical Provider, MD  levothyroxine (SYNTHROID, LEVOTHROID) 25 MCG tablet Take 25 mcg by mouth every morning.     Historical Provider, MD  nitroGLYCERIN (NITROSTAT) 0.4 MG SL tablet Place 1 tablet (0.4 mg total) under the tongue every 5 (five) minutes as needed. 05/20/11   Lendon Colonel, NP  omeprazole (PRILOSEC) 20 MG capsule Take 20 mg by mouth 2 (two) times daily.     Historical Provider, MD  raloxifene (EVISTA) 60 MG tablet Take 60 mg by mouth every morning.     Historical Provider, MD  sodium bicarbonate 325 MG tablet Take 650 mg by mouth 3 (three) times daily.     Historical Provider, MD  Travoprost, BAK Free, (TRAVATAN) 0.004 % SOLN ophthalmic solution Place 1 drop into both eyes at bedtime.    Historical Provider, MD   Triage Vitals: BP 103/66 mmHg  Pulse 76  Temp(Src) 98.1 F (36.7 C) (Oral)  Resp 18  Ht 5\' 10"  (1.778 m)  Wt 137 lb (62.143 kg)  BMI 19.66 kg/m2  SpO2 100%   Physical Exam  Constitutional: She is oriented to person, place, and time. She appears well-developed and well-nourished. No distress.  HENT:  Head: Normocephalic and atraumatic.  Eyes: Conjunctivae and EOM are normal.  Neck: Neck supple. No tracheal deviation present.  Cardiovascular: Normal rate and regular rhythm.   Pulmonary/Chest: Effort normal. No  respiratory distress.  Abdominal: Soft. There is no tenderness.  Musculoskeletal: Normal range of motion.  Neurological: She is alert and oriented to person, place, and time.  Skin: Skin is warm and dry.  Psychiatric: She has a normal mood and affect. Her behavior is normal.  Nursing note and vitals reviewed.   ED Course  Procedures (including critical care time)  DIAGNOSTIC STUDIES: Oxygen Saturation is 100% on RA, normal by my interpretation.    COORDINATION OF CARE: 10:01 AM-Discussed treatment plan which  includes CBC, BMP, Magnesium, Troponin, UA with pt at bedside and pt agreed to plan.   Labs Review Labs Reviewed  CBC WITH DIFFERENTIAL/PLATELET - Abnormal; Notable for the following:    RBC 3.30 (*)    Hemoglobin 10.1 (*)    HCT 32.3 (*)    Platelets 149 (*)    Monocytes Relative 13 (*)    Eosinophils Relative 6 (*)    All other components within normal limits  BASIC METABOLIC PANEL - Abnormal; Notable for the following:    BUN 23 (*)    Creatinine, Ser 2.06 (*)    GFR calc non Af Amer 23 (*)    GFR calc Af Amer 27 (*)    All other components within normal limits  URINALYSIS, ROUTINE W REFLEX MICROSCOPIC (NOT AT [facility]) - Abnormal; Notable for the following:    Protein, ur TRACE (*)    Leukocytes, UA MODERATE (*)    All other components within normal limits  URINE MICROSCOPIC-ADD ON - Abnormal; Notable for the following:    Squamous Epithelial / LPF MANY (*)    Bacteria, UA MANY (*)    All other components within normal limits  URINE CULTURE  MAGNESIUM  TROPONIN I    Imaging Review No results found.   EKG Interpretation   Date/Time:  Thursday November 03 2014 10:06:16 EDT Ventricular Rate:  75 PR Interval:  166 QRS Duration: 106 QT Interval:  425 QTC Calculation: 475 R Axis:   75 Text Interpretation:  Sinus rhythm Probable inferior infarct, old No  significant change since last tracing Confirmed by Twilight  MD, [place]  ([id number]) on 11/03/2014 11:06:05 AM       MDM   Final diagnoses:  Other fatigue  UTI (lower urinary tract infection)  Dehydration    I personally preformed the services scribed in my presence. The recorded information has been reviewed is accurate. Virgel Manifold, MD.      Virgel Manifold, MD 11/09/14 [phone]

## 2014-11-05 LAB — URINE CULTURE

## 2015-03-17 ENCOUNTER — Other Ambulatory Visit: Payer: Self-pay | Admitting: *Deleted

## 2015-03-17 MED ORDER — FUROSEMIDE 20 MG PO TABS: ORAL_TABLET | ORAL | Status: DC

## 2015-06-12 ENCOUNTER — Encounter: Payer: Self-pay | Admitting: Adult Health

## 2015-06-12 ENCOUNTER — Other Ambulatory Visit: Payer: Self-pay | Admitting: Obstetrics and Gynecology

## 2015-06-12 ENCOUNTER — Ambulatory Visit (INDEPENDENT_AMBULATORY_CARE_PROVIDER_SITE_OTHER): Payer: Medicare Other | Admitting: Adult Health

## 2015-06-12 ENCOUNTER — Ambulatory Visit (INDEPENDENT_AMBULATORY_CARE_PROVIDER_SITE_OTHER): Payer: Medicare Other

## 2015-06-12 VITALS — BP 92/52 | HR 86 | Ht 68.0 in | Wt 132.5 lb

## 2015-06-12 DIAGNOSIS — N854 Malposition of uterus: Secondary | ICD-10-CM

## 2015-06-12 DIAGNOSIS — N858 Other specified noninflammatory disorders of uterus: Secondary | ICD-10-CM

## 2015-06-12 DIAGNOSIS — N950 Postmenopausal bleeding: Secondary | ICD-10-CM | POA: Diagnosis not present

## 2015-06-12 DIAGNOSIS — R102 Pelvic and perineal pain: Secondary | ICD-10-CM | POA: Diagnosis not present

## 2015-06-12 HISTORY — DX: Postmenopausal bleeding: N95.0

## 2015-06-12 NOTE — Progress Notes (Signed)
PELVIC US TA/TV: heterogenous anteverted uterus w/mult. uterine calcifications(? arcuate artery calcifications),complex thickened endometrium 8.36mm,normal ov's bilat (mobile),bilat adnexal pain during ultrasound,no free fluid seen.

## 2015-06-12 NOTE — Patient Instructions (Signed)
Endometrial Biopsy Endometrial biopsy is a procedure in which a tissue sample is taken from inside the uterus. The tissue sample is then looked at under a microscope to see if the tissue is normal or abnormal. The endometrium is the lining of the uterus. This procedure helps determine where you are in your menstrual cycle and how hormone levels are affecting the lining of the uterus. This procedure may also be used to evaluate uterine bleeding or to diagnose endometrial cancer, tuberculosis, polyps, or inflammatory conditions.  LET [facility] CARE PROVIDER KNOW ABOUT:  Any allergies you have.  All medicines you are taking, including vitamins, herbs, eye drops, creams, and over-the-counter medicines.  Previous problems you or members of your family have had with the use of anesthetics.  Any blood disorders you have.  Previous surgeries you have had.  Medical conditions you have.  Possibility of pregnancy. RISKS AND COMPLICATIONS Generally, this is a safe procedure. However, as with any procedure, complications can occur. Possible complications include:  Bleeding.  Pelvic infection.  Puncture of the uterine wall with the biopsy device (rare). BEFORE THE PROCEDURE   Keep a record of your menstrual cycles as directed by your health care provider. You may need to schedule your procedure for a specific time in your cycle.  You may want to bring a sanitary pad to wear home after the procedure.  Arrange for someone to drive you home after the procedure if you will be given a medicine to help you relax (sedative). PROCEDURE   You may be given a sedative to relax you.  You will lie on an exam table with your feet and legs supported as in a pelvic exam.  Your health care provider will insert an instrument (speculum) into your vagina to see your cervix.  Your cervix will be cleansed with an antiseptic solution. A medicine (local anesthetic) will be used to numb the cervix.  A forceps  instrument (tenaculum) will be used to hold your cervix steady for the biopsy.  A thin, rodlike instrument (uterine sound) will be inserted through your cervix to determine the length of your uterus and the location where the biopsy sample will be removed.  A thin, flexible tube (catheter) will be inserted through your cervix and into the uterus. The catheter is used to collect the biopsy sample from your endometrial tissue.  The catheter and speculum will then be removed, and the tissue sample will be sent to a lab for examination. AFTER THE PROCEDURE  You will rest in a recovery area until you are ready to go home.  You may have mild cramping and a small amount of vaginal bleeding for a few days after the procedure. This is normal.  Make sure you find out how to get your test results.   This information is not intended to replace advice given to you by your health care provider. Make sure you discuss any questions you have with your health care provider.   Document Released: 08/30/2004 Document Revised: 12/30/2012 Document Reviewed: 10/14/2012 Elsevier Interactive Patient Education 2016 [employer] Postmenopausal Bleeding Postmenopausal bleeding is any bleeding a woman has after she has entered into menopause. Menopause is the end of a woman's fertile years. After menopause, a woman no longer ovulates or has menstrual periods.  Postmenopausal bleeding can be caused by various things. Any type of postmenopausal bleeding, even if it appears to be a typical menstrual period, is concerning. This should be evaluated by your health care provider. Any treatment  will depend on the cause of the bleeding. HOME CARE INSTRUCTIONS Monitor your condition for any changes. The following actions may help to alleviate any discomfort you are experiencing:  Avoid the use of tampons and douches as directed by your health care provider.  Change your pads frequently.  Get regular pelvic exams and Pap  tests.  Keep all follow-up appointments for diagnostic tests as directed by your health care provider. SEEK MEDICAL CARE IF:   Your bleeding lasts more than 1 week.  You have abdominal pain.  You have bleeding with sexual intercourse. SEEK IMMEDIATE MEDICAL CARE IF:   You have a fever, chills, headache, dizziness, muscle aches, and bleeding.  You have severe pain with bleeding.  You are passing blood clots.  You have bleeding and need more than 1 pad an hour.  You feel faint. MAKE SURE YOU:  Understand these instructions.  Will watch your condition.  Will get help right away if you are not doing well or get worse.   This information is not intended to replace advice given to you by your health care provider. Make sure you discuss any questions you have with your health care provider.   Document Released: 08/07/2005 Document Revised: 02/17/2013 Document Reviewed: 11/26/2012 Elsevier Interactive Patient Education [employer]. Return in 4 days for endometrial biopsy

## 2015-06-12 NOTE — Progress Notes (Addendum)
Subjective:     Patient ID: Tiffany [name], female   DOB: 1940/09/07, 75 y.o.   MRN: [id number]  HPI Tiffany [name] is a 75 year old white female, called this morning complaining of vaginal bleeding for about 3 weeks, just told daughter last night, has no pain.Was told to come in for [place] to assess and see Dr Glo Herring but he had to leave this afternoon, so I reviewed [place] with her and made follow up appt with Dr Elonda Husky on Friday when daughter is off work.  Review of Systems  +PMB, no pain Reviewed past medical,surgical, social and family history. Reviewed medications and allergies.     Objective:   Physical Exam BP 92/52 mmHg  Pulse 86  Ht 5\' 8"  (1.727 m)  Wt 132 lb 8 oz (60.102 kg)  BMI 20.15 kg/m2Deferred exam til when has endo biopsy. Reviewed [place]:   Uterus 5.6 x 3.1 x 3.7 cm, heterogenous anteverted uterus w/mult. uterine calcifications(? arcuate artery calcifications)  Endometrium 8.8 mm, symmetrical, complex and thickened   Right ovary 1.5 x 1.6 x 1.1 cm, wnl  Left ovary 1.4 x .9 x 1.4 cm, wnl  No free fluid seen  Technician Comments  PELVIC US TA/TV: heterogenous anteverted uterus w/mult. uterine calcifications(? arcuate artery calcifications),complex, thickened endometrium 8.27mm,normal ov's bilat (mobile),bilat adnexal pain during ultrasound,no free fluid seen. Pt is seeing Anderson Malta after ultrasound. Discussed the need for endometrial biopsy to assess tissue to rule out cancer and she agrees.   Assessment:     PMB    Plan:     Return in 4 days for endometrial biopsy Review handout on PMB and endo biopsy

## 2015-06-15 ENCOUNTER — Other Ambulatory Visit: Payer: Self-pay | Admitting: Obstetrics & Gynecology

## 2015-06-15 ENCOUNTER — Ambulatory Visit (INDEPENDENT_AMBULATORY_CARE_PROVIDER_SITE_OTHER): Payer: Medicare Other | Admitting: Obstetrics & Gynecology

## 2015-06-15 ENCOUNTER — Encounter: Payer: Self-pay | Admitting: Obstetrics & Gynecology

## 2015-06-15 VITALS — BP 160/80 | HR 86 | Ht 68.0 in | Wt 131.0 lb

## 2015-06-15 DIAGNOSIS — N950 Postmenopausal bleeding: Secondary | ICD-10-CM | POA: Diagnosis not present

## 2015-06-15 DIAGNOSIS — R938 Abnormal findings on diagnostic imaging of other specified body structures: Secondary | ICD-10-CM

## 2015-06-15 DIAGNOSIS — R9389 Abnormal findings on diagnostic imaging of other specified body structures: Secondary | ICD-10-CM

## 2015-06-15 NOTE — Addendum Note (Signed)
Addended by: Diona Fanti A on: 06/15/2015 02:41 PM   Modules accepted: Orders

## 2015-06-15 NOTE — Progress Notes (Signed)
Patient ID: Tiffany [name], female   DOB: Mar 12, 1941, 75 y.o.   MRN: [id number] Endometrial Biopsy Procedure Note  Pre-operative Diagnosis:    ICD-9-CM ICD-10-CM   1. Post-menopausal bleeding 627.1 N95.0   2. Thickened endometrium 793.5 R93.8     Post-operative Diagnosis: same  Indications: postmenopausal bleeding  Procedure Details   Urine pregnancy test was not done.  The risks (including infection, bleeding, pain, and uterine perforation) and benefits of the procedure were explained to the patient and Written informed consent was obtained.  Antibiotic prophylaxis against endocarditis was not indicated.   The patient was placed in the dorsal lithotomy position.  Bimanual exam showed the uterus to be in the neutral position.  A Graves' speculum inserted in the vagina, and the cervix prepped with povidone iodine.  Endocervical curettage with a Kevorkian curette was not performed.   A sharp tenaculum was applied to the anterior lip of the cervix for stabilization.  A sterile uterine sound was used to sound the uterus to a depth of 6cm.  A Mylex 67mm curette was used to sample the endometrium.  Sample was sent for pathologic examination.  Condition: Stable  Complications: None  Plan:  The patient was advised to call for any fever or for prolonged or severe pain or bleeding. She was advised to use OTC analgesics as needed for mild to moderate pain. She was advised to avoid vaginal intercourse for 48 hours or until the bleeding has completely stopped.  Attending Physician Documentation: I was present for or performed the following: endometrial biopsy  Follow up 1 week for biopsy result

## 2015-06-16 ENCOUNTER — Other Ambulatory Visit: Payer: Medicare Other | Admitting: Obstetrics & Gynecology

## 2015-06-21 ENCOUNTER — Ambulatory Visit (INDEPENDENT_AMBULATORY_CARE_PROVIDER_SITE_OTHER): Payer: Medicare Other | Admitting: Obstetrics & Gynecology

## 2015-06-21 ENCOUNTER — Encounter: Payer: Self-pay | Admitting: Obstetrics & Gynecology

## 2015-06-21 VITALS — BP 130/60 | HR 88 | Wt 132.0 lb

## 2015-06-21 DIAGNOSIS — N950 Postmenopausal bleeding: Secondary | ICD-10-CM

## 2015-06-21 MED ORDER — MEDROXYPROGESTERONE ACETATE 10 MG PO TABS: 10.0000 mg | ORAL_TABLET | Freq: Every day | ORAL | Status: DC

## 2015-06-21 NOTE — Progress Notes (Signed)
Patient ID: Tiffany [name], female   DOB: April 07, 1941, 75 y.o.   MRN: [id number] Pt in for follow up of her biopsy: benign no hyperplasia or malignancy  Will cycle with provera x 6 months and recheck her endometrium then and decide on further therapy if needed     Face to face time:  10 minutes  Greater than 50% of the visit time was spent in counseling and coordination of care with the patient.  The summary and outline of the counseling and care coordination is summarized in the note above.   All questions were answered.

## 2015-08-15 ENCOUNTER — Telehealth: Payer: Self-pay | Admitting: Obstetrics & Gynecology

## 2015-08-16 NOTE — Telephone Encounter (Signed)
Kidney problems are a non issue.  She had lymphoma which also is not in any way an issue, that iis, lymphoma is not hormonally  And is not affected at all.    Additionally she is on the provera to suppress the endometrium to prevent the tissue that lines the uterus from becoming hyperplastic or a cancer.

## 2015-08-16 NOTE — Telephone Encounter (Signed)
Pt informed of Dr. Elonda Husky recommendation. Pt states "what about risk for blood clots?" Pt informed per Dr.Eure, she is on a blood thinner should decrease any risk for clots. Pt does not have to take the Provera but will continue to have the vaginal bleeding if she does not. Pt verbalized understanding.

## 2015-09-08 ENCOUNTER — Encounter (HOSPITAL_COMMUNITY): Payer: Self-pay

## 2015-09-08 ENCOUNTER — Emergency Department (HOSPITAL_COMMUNITY): Payer: Medicare Other

## 2015-09-08 ENCOUNTER — Emergency Department (HOSPITAL_COMMUNITY)
Admission: EM | Admit: 2015-09-08 | Discharge: 2015-09-08 | Disposition: A | Discharge status: Home / Self Care | Payer: Medicare Other | Attending: Emergency Medicine | Admitting: Emergency Medicine

## 2015-09-08 DIAGNOSIS — N390 Urinary tract infection, site not specified: Secondary | ICD-10-CM | POA: Diagnosis not present

## 2015-09-08 DIAGNOSIS — J449 Chronic obstructive pulmonary disease, unspecified: Secondary | ICD-10-CM | POA: Diagnosis not present

## 2015-09-08 DIAGNOSIS — I2510 Atherosclerotic heart disease of native coronary artery without angina pectoris: Secondary | ICD-10-CM | POA: Diagnosis not present

## 2015-09-08 DIAGNOSIS — F329 Major depressive disorder, single episode, unspecified: Secondary | ICD-10-CM | POA: Insufficient documentation

## 2015-09-08 DIAGNOSIS — E785 Hyperlipidemia, unspecified: Secondary | ICD-10-CM | POA: Insufficient documentation

## 2015-09-08 DIAGNOSIS — N183 Chronic kidney disease, stage 3 (moderate): Secondary | ICD-10-CM | POA: Insufficient documentation

## 2015-09-08 DIAGNOSIS — Z79899 Other long term (current) drug therapy: Secondary | ICD-10-CM | POA: Diagnosis not present

## 2015-09-08 DIAGNOSIS — R300 Dysuria: Secondary | ICD-10-CM | POA: Diagnosis present

## 2015-09-08 DIAGNOSIS — F17210 Nicotine dependence, cigarettes, uncomplicated: Secondary | ICD-10-CM | POA: Diagnosis not present

## 2015-09-08 DIAGNOSIS — N200 Calculus of kidney: Secondary | ICD-10-CM | POA: Insufficient documentation

## 2015-09-08 LAB — CBC WITH DIFFERENTIAL/PLATELET
Basophils Absolute: 0 10*3/uL (ref 0.0–0.1)
Basophils Relative: 0 %
EOS PCT: 0 %
Eosinophils Absolute: 0 10*3/uL (ref 0.0–0.7)
HCT: 29 % — ABNORMAL LOW (ref 36.0–46.0)
Hemoglobin: 9.2 g/dL — ABNORMAL LOW (ref 12.0–15.0)
LYMPHS ABS: 1.1 10*3/uL (ref 0.7–4.0)
LYMPHS PCT: 17 %
MCH: 28.9 pg (ref 26.0–34.0)
MCHC: 31.7 g/dL (ref 30.0–36.0)
MCV: 91.2 fL (ref 78.0–100.0)
MONO ABS: 0.7 10*3/uL (ref 0.1–1.0)
MONOS PCT: 11 %
Neutro Abs: 4.5 10*3/uL (ref 1.7–7.7)
Neutrophils Relative %: 72 %
PLATELETS: 193 10*3/uL (ref 150–400)
RBC: 3.18 MIL/uL — ABNORMAL LOW (ref 3.87–5.11)
RDW: 14.9 % (ref 11.5–15.5)
WBC: 6.2 10*3/uL (ref 4.0–10.5)

## 2015-09-08 LAB — URINALYSIS, ROUTINE W REFLEX MICROSCOPIC
Bilirubin Urine: NEGATIVE
Glucose, UA: NEGATIVE mg/dL
Ketones, ur: NEGATIVE mg/dL
Nitrite: NEGATIVE
Specific Gravity, Urine: <1.005 — ABNORMAL LOW (ref 1.005–1.030)
pH: 6 (ref 5.0–8.0)

## 2015-09-08 LAB — URINE MICROSCOPIC-ADD ON

## 2015-09-08 LAB — BASIC METABOLIC PANEL
Anion gap: 7 (ref 5–15)
BUN: 37 mg/dL — AB (ref 6–20)
CHLORIDE: 106 mmol/L (ref 101–111)
CO2: 19 mmol/L — ABNORMAL LOW (ref 22–32)
Calcium: 8.8 mg/dL — ABNORMAL LOW (ref 8.9–10.3)
Creatinine, Ser: 2.22 mg/dL — ABNORMAL HIGH (ref 0.44–1.00)
GFR calc Af Amer: 24 mL/min — ABNORMAL LOW (ref >60)
GFR calc non Af Amer: 21 mL/min — ABNORMAL LOW (ref >60)
GLUCOSE: 86 mg/dL (ref 65–99)
POTASSIUM: 4.3 mmol/L (ref 3.5–5.1)
Sodium: 132 mmol/L — ABNORMAL LOW (ref 135–145)

## 2015-09-08 MED ORDER — CEPHALEXIN 250 MG PO CAPS: 250.0000 mg | ORAL_CAPSULE | Freq: Four times a day (QID) | ORAL | Status: DC

## 2015-09-08 NOTE — ED Provider Notes (Signed)
CSN: [id number]     Arrival date & time 09/08/15  1455 History   First MD Initiated Contact with Patient 09/08/15 1517     Chief Complaint  Patient presents with  . Dysuria     (Consider location/radiation/quality/duration/timing/severity/associated sxs/prior Treatment) HPI   Tiffany [name] is a 75 y.o. female who presents for evaluation of decreased urinary output, and a sensation of not being able to void as much as usual for 3 days. She is concerned that she might have an obstructing kidney stone. She had a routine ultrasound done for evaluation of renal function, 4 days ago. At that time she was told that she had bilateral renal stones. She denies fever, chills, nausea, vomiting, weakness or dizziness. She is taking her usual medications. She has chronic renal disease, followed by a urologist. She has not had renal function testing done recently. There are no other no modifying factors.   Past Medical History  Diagnosis Date  . Lymphoma ([place])   . Systemic lupus erythematosus ([place])   . Depression   . Hyperlipidemia   . CAD (coronary artery disease)     DES to mid RCA 2006  . Sjogren's syndrome ([place])   . Orthostatic hypotension   . Dysphagia   . Syncope   . Anemia   . CKD (chronic kidney disease) stage 3, GFR 30-59 ml/min   . COPD (chronic obstructive pulmonary disease) ([place])   . PMB (postmenopausal bleeding) 06/12/2015   Past Surgical History  Procedure Laterality Date  . Breast excisional biopsy Left   . Eye surgery    . Mass excision      Lymphoma  . Coronary angioplasty with stent placement    . Cholecystectomy    . Cataract extraction Bilateral   . Neck surgery     Family History  Problem Relation Age of Onset  . Coronary artery disease Father   . Coronary artery disease Mother   . Liver disease Mother   . Esophageal cancer Brother   . Breast cancer Sister    Social History  Substance Use Topics  . Smoking status: Current Every Day Smoker -- 0.25 packs/day  for 23 years    Types: Cigarettes    Start date: 07/14/1986  . Smokeless tobacco: Never Used  . Alcohol Use: No   OB History    Gravida Para Term Preterm AB TAB SAB Ectopic Multiple Living   3 3        3      Review of Systems  All other systems reviewed and are negative.     Allergies  Florinef; Iohexol; Sulfonamide derivatives; and Tape  Home Medications   Prior to Admission medications   Medication Sig Start Date End Date Taking? Authorizing Provider  albuterol (PROVENTIL HFA;VENTOLIN HFA) 108 (90 BASE) MCG/ACT inhaler Inhale 2 puffs into the lungs every 6 (six) hours as needed for wheezing or shortness of breath. 10/24/13  Yes Nita Sells, MD  ALPRAZolam Duanne Moron) 0.5 MG tablet Take 0.5 mg by mouth 3 (three) times daily.    Yes Historical Provider, MD  amitriptyline (ELAVIL) 100 MG tablet Take 100 mg by mouth daily. 07/21/14  Yes Historical Provider, MD  Artificial Tear Solution (SOOTHE XP OP) Apply 1 drop to eye 3 (three) times daily.    Yes Historical Provider, MD  aspirin EC 81 MG tablet Take 81 mg by mouth at bedtime.    Yes Historical Provider, MD  atorvastatin (LIPITOR) 40 MG tablet Take 1 tablet (40 mg total)  by mouth at bedtime. 04/21/13  Yes Lendon Colonel, NP  Cholecalciferol (VITAMIN D) 400 UNITS capsule Take 400 Units by mouth every evening.    Yes Historical Provider, MD  clobetasol cream (TEMOVATE) [id number] % Apply 1 application topically 2 (two) times daily.   Yes Historical Provider, MD  Cyanocobalamin (B-12 PO) Take 1 tablet by mouth daily.   Yes Historical Provider, MD  folic acid (FOLVITE) 1 MG tablet Take 1 mg by mouth every morning.    Yes Historical Provider, MD  furosemide (LASIX) 20 MG tablet TAKE 1 TABLET (20 MG TOTAL) BY MOUTH EVERY OTHER DAY AS NEEDED FOR EDEMA 03/17/15  Yes Satira Sark, MD  HYDROcodone-acetaminophen (NORCO/VICODIN) 5-325 MG per tablet Take 1 tablet by mouth 3 (three) times daily.    Yes Historical Provider, MD   hydroxychloroquine (PLAQUENIL) 200 MG tablet Take 400 mg by mouth daily. To be taken with food or milk 02/07/13  Yes Historical Provider, MD  iron polysaccharides (NIFEREX) 150 MG capsule Take 150 mg by mouth 2 (two) times daily.    Yes Historical Provider, MD  L-LYSINE PO Take 1 tablet by mouth 2 (two) times daily.    Yes Historical Provider, MD  levothyroxine (SYNTHROID, LEVOTHROID) 25 MCG tablet Take 25 mcg by mouth every morning.    Yes Historical Provider, MD  medroxyPROGESTERone (PROVERA) 10 MG tablet Take 1 tablet (10 mg total) by mouth daily. 06/21/15  Yes Florian Buff, MD  nitroGLYCERIN (NITROSTAT) 0.4 MG SL tablet Place 1 tablet (0.4 mg total) under the tongue every 5 (five) minutes as needed. 05/20/11  Yes Lendon Colonel, NP  omeprazole (PRILOSEC) 20 MG capsule Take 20 mg by mouth 2 (two) times daily.    Yes Historical Provider, MD  raloxifene (EVISTA) 60 MG tablet Take 60 mg by mouth every morning.    Yes Historical Provider, MD  sodium bicarbonate 325 MG tablet Take 650 mg by mouth 3 (three) times daily.    Yes Historical Provider, MD  Travoprost, BAK Free, (TRAVATAN) 0.004 % SOLN ophthalmic solution Place 1 drop into both eyes at bedtime.   Yes Historical Provider, MD  cephALEXin (KEFLEX) 250 MG capsule Take 1 capsule (250 mg total) by mouth 4 (four) times daily. 09/08/15   Daleen Bo, MD   BP 109/67 mmHg  Pulse 76  Temp(Src) 98.4 F (36.9 C) (Oral)  Resp 15  Ht 5\' 2"  (1.575 m)  Wt 128 lb (58.06 kg)  BMI 23.41 kg/m2  SpO2 100% Physical Exam  Constitutional: She is oriented to person, place, and time. She appears well-developed.  Elderly, frail  HENT:  Head: Normocephalic and atraumatic.  Right Ear: External ear normal.  Left Ear: External ear normal.  Eyes: Conjunctivae and EOM are normal. Pupils are equal, round, and reactive to light.  Neck: Normal range of motion and phonation normal. Neck supple.  Cardiovascular: Normal rate, regular rhythm and normal heart sounds.    Pulmonary/Chest: Effort normal and breath sounds normal. No respiratory distress. She exhibits no bony tenderness.  Abdominal: Soft. She exhibits no distension. There is no tenderness. There is no guarding.  Genitourinary:  No costovertebral angle tenderness with percussion.  Musculoskeletal: Normal range of motion. She exhibits no edema or tenderness.  Neurological: She is alert and oriented to person, place, and time. No cranial nerve deficit or sensory deficit. She exhibits normal muscle tone. Coordination normal.  Skin: Skin is warm, dry and intact.  Psychiatric: She has a normal mood and affect. Her  behavior is normal. Judgment and thought content normal.  Nursing note and vitals reviewed.   ED Course  Procedures (including critical care time)  Initial clinical impression- nonspecific voiding difficulty, unlikely to represent acute ureteral obstruction. No overt signs for infection. We will evaluate with screening labs to evaluate renal function, and check status of known renal stones with CT imaging.  Medications - No data to display  Patient Vitals for the past 24 hrs:  BP Temp Temp src Pulse Resp SpO2 Height Weight  09/08/15 1730 109/67 mmHg - - 76 - 100 % - -  09/08/15 1648 140/72 mmHg - - 101 15 100 % - -  09/08/15 1458 104/60 mmHg 98.4 F (36.9 C) Oral 98 18 99 % 5\' 2"  (1.575 m) 128 lb (58.06 kg)    6:09 PM Reevaluation with update and discussion. After initial assessment and treatment, an updated evaluation reveals she remains comfortable. History of fluids without difficulty. Findings discussed with patient, and daughter, all questions were answered. Humaira Sculley L    Labs Review Labs Reviewed  URINALYSIS, ROUTINE W REFLEX MICROSCOPIC (NOT AT [facility]) - Abnormal; Notable for the following:    Specific Gravity, Urine <1.005 (*)    Hgb urine dipstick LARGE (*)    Protein, ur TRACE (*)    Leukocytes, UA SMALL (*)    All other components within normal limits  BASIC  METABOLIC PANEL - Abnormal; Notable for the following:    Sodium 132 (*)    CO2 19 (*)    BUN 37 (*)    Creatinine, Ser 2.22 (*)    Calcium 8.8 (*)    GFR calc non Af Amer 21 (*)    GFR calc Af Amer 24 (*)    All other components within normal limits  CBC WITH DIFFERENTIAL/PLATELET - Abnormal; Notable for the following:    RBC 3.18 (*)    Hemoglobin 9.2 (*)    HCT 29.0 (*)    All other components within normal limits  URINE MICROSCOPIC-ADD ON - Abnormal; Notable for the following:    Squamous Epithelial / LPF 0-5 (*)    Bacteria, UA FEW (*)    All other components within normal limits  URINE CULTURE    Imaging Review Ct Renal Stone Study  09/08/2015  CLINICAL DATA:  Hematuria, burning urination starting this morning, flank pain EXAM: CT ABDOMEN AND PELVIS WITHOUT CONTRAST TECHNIQUE: Multidetector CT imaging of the abdomen and pelvis was performed following the standard protocol without IV contrast. COMPARISON:  Renal ultrasound 4/ 24/17 FINDINGS: Lower chest: Lung bases shows streaky atelectasis or scarring in left lower lobe posteriorly. Mild emphysematous changes. Small hiatal hernia. Hepatobiliary: Unenhanced liver shows no biliary ductal dilatation. The patient is status post cholecystectomy. No CBD dilatation. Pancreas: Unenhanced pancreas is mild atrophic. Spleen: Unenhanced spleen is unremarkable. Adrenals/Urinary Tract: Unenhanced adrenal glands are unremarkable. Multiple bilateral nonobstructive renal calculi are noted. The largest calculus in lower pole of the right kidney measures 5.3 mm. Largest calculus in lower pole of the left kidney measures 5.6 mm. Largest nonobstructive calculus in upper pole of the right kidney measures 4 mm. Mild dilatation of bilateral extrarenal pelvis. No hydronephrosis or hydroureter. No calcified ureteral calculi are noted bilaterally. At least 3 or 4 calcified calculi are noted within posterior aspect of the urinary bladder the largest measures 3.3  mm. Bilateral mild lobulated renal contour. Mild bilateral cortical thinning probable due to atrophy. Again noted exophytic cyst in lower pole of the left kidney measures 8  mm. Stomach/Bowel: There is no gastric outlet obstruction. No small bowel obstruction. No thickened or dilated small bowel loops. There is a low lying cecum. No pericecal inflammation. Abundant stool noted within cecum and right colon. Abundant stool noted within redundant transverse colon. Moderate stool and gas noted within descending colon. The appendix is not identified. No distal colonic obstruction. Vascular/Lymphatic: Atherosclerotic calcifications of abdominal aorta and iliac arteries are noted. Reproductive: The uterus is atrophic. No adnexal masses noted. Small calcified adnexal vessels. Other: There is no ascites or free air. Musculoskeletal: No destructive bony lesions are noted within pelvis. Sagittal images of the spine shows osteopenia and degenerative changes lumbar spine. Significant disc space flattening with vacuum disc phenomenon and endplate sclerotic changes at L1-L2 and L5-S1 level. IMPRESSION: 1. There is bilateral nonobstructive nephrolithiasis. No hydronephrosis or hydroureter. No calcified ureteral calculi. Bilateral mild prominent extrarenal pelvis. 2. Again noted exophytic cyst in lower pole of the left kidney measures 8 mm. 3. Small calcified calculi are noted within posterior aspect of the urinary bladder the largest measures 3.3 mm. 4. There is a low lying cecum. No pericecal inflammation. The appendix is not identified. Abundant stool noted in right colon and transverse colon. There is redundant transverse colon. No distal colonic obstruction. 5. No small bowel obstruction. 6. Degenerative changes lumbar spine. 7. Small hiatal hernia. Electronically Signed   By: Lahoma Crocker M.D.   On: 09/08/2015 16:55   I have personally reviewed and evaluated these images and lab results as part of my medical  decision-making.   EKG Interpretation None      MDM   Final diagnoses:  Urinary tract infection without hematuria, site unspecified  Kidney stones    Nonspecific neurologic symptoms, with evidence for UTI. Doubt pyelonephritis, metabolic instability or impending vascular collapse. Apparent stable renal stone disease, without current obstructing stone. Patient has a remote history of lithotripsy, but does not regularly see a urologist at this time.  Nursing Notes Reviewed/ Care Coordinated Applicable Imaging Reviewed Interpretation of Laboratory Data incorporated into ED treatment  The patient appears reasonably screened and/or stabilized for discharge and I doubt any other medical condition or other [facility] requiring further screening, evaluation, or treatment in the ED at this time prior to discharge.  Plan: Home Medications- Keflex; Home Treatments- rest, fluids; return here if the recommended treatment, does not improve the symptoms; Recommended follow up- Urology 1 week     Daleen Bo, MD 09/08/15 [phone]

## 2015-09-08 NOTE — ED Notes (Signed)
Pt reports burning with urination and urinary retention for the past week.  Reports had an [place] and it showed she has kidney stones.    Pt denies any pain.

## 2015-09-08 NOTE — Discharge Instructions (Signed)
Urinary Tract Infection A urinary tract infection (UTI) can occur any place along the urinary tract. The tract includes the kidneys, ureters, bladder, and urethra. A type of germ called bacteria often causes a UTI. UTIs are often helped with antibiotic medicine.  HOME CARE   If given, take antibiotics as told by your doctor. Finish them even if you start to feel better.  Drink enough fluids to keep your pee (urine) clear or pale yellow.  Avoid tea, drinks with caffeine, and bubbly (carbonated) drinks.  Pee often. Avoid holding your pee in for a long time.  Pee before and after having sex (intercourse).  Wipe from front to back after you poop (bowel movement) if you are a woman. Use each tissue only once. GET HELP RIGHT AWAY IF:   You have back pain.  You have lower belly (abdominal) pain.  You have chills.  You feel sick to your stomach (nauseous).  You throw up (vomit).  Your burning or discomfort with peeing does not go away.  You have a fever.  Your symptoms are not better in 3 days. MAKE SURE YOU:   Understand these instructions.  Will watch your condition.  Will get help right away if you are not doing well or get worse.   This information is not intended to replace advice given to you by your health care provider. Make sure you discuss any questions you have with your health care provider.   Document Released: 10/16/2007 Document Revised: 05/20/2014 Document Reviewed: 11/28/2011 Elsevier Interactive Patient Education 2016 [place].  Kidney Stones Kidney stones (urolithiasis) are deposits that form inside your kidneys. The intense pain is caused by the stone moving through the urinary tract. When the stone moves, the ureter goes into spasm around the stone. The stone is usually passed in the urine.  CAUSES   A disorder that makes certain neck glands produce too much parathyroid hormone (primary hyperparathyroidism).  A buildup of uric acid crystals,  similar to gout in your joints.  Narrowing (stricture) of the ureter.  A kidney obstruction present at birth (congenital obstruction).  Previous surgery on the kidney or ureters.  Numerous kidney infections. SYMPTOMS   Feeling sick to your stomach (nauseous).  Throwing up (vomiting).  Blood in the urine (hematuria).  Pain that usually spreads (radiates) to the groin.  Frequency or urgency of urination. DIAGNOSIS   Taking a history and physical exam.  Blood or urine tests.  CT scan.  Occasionally, an examination of the inside of the urinary bladder (cystoscopy) is performed. TREATMENT   Observation.  Increasing your fluid intake.  Extracorporeal shock wave lithotripsy--This is a noninvasive procedure that uses shock waves to break up kidney stones.  Surgery may be needed if you have severe pain or persistent obstruction. There are various surgical procedures. Most of the procedures are performed with the use of small instruments. Only small incisions are needed to accommodate these instruments, so recovery time is minimized. The size, location, and chemical composition are all important variables that will determine the proper choice of action for you. Talk to your health care provider to better understand your situation so that you will minimize the risk of injury to yourself and your kidney.  HOME CARE INSTRUCTIONS   Drink enough water and fluids to keep your urine clear or pale yellow. This will help you to pass the stone or stone fragments.  Strain all urine through the provided strainer. Keep all particulate matter and stones for your health care  provider to see. The stone causing the pain may be as small as a grain of salt. It is very important to use the strainer each and every time you pass your urine. The collection of your stone will allow your health care provider to analyze it and verify that a stone has actually passed. The stone analysis will often identify  what you can do to reduce the incidence of recurrences.  Only take over-the-counter or prescription medicines for pain, discomfort, or fever as directed by your health care provider.  Keep all follow-up visits as told by your health care provider. This is important.  Get follow-up X-rays if required. The absence of pain does not always mean that the stone has passed. It may have only stopped moving. If the urine remains completely obstructed, it can cause loss of kidney function or even complete destruction of the kidney. It is your responsibility to make sure X-rays and follow-ups are completed. Ultrasounds of the kidney can show blockages and the status of the kidney. Ultrasounds are not associated with any radiation and can be performed easily in a matter of minutes.  Make changes to your daily diet as told by your health care provider. You may be told to:  Limit the amount of salt that you eat.  Eat 5 or more servings of fruits and vegetables each day.  Limit the amount of meat, poultry, fish, and eggs that you eat.  Collect a 24-hour urine sample as told by your health care provider.You may need to collect another urine sample every 6-12 months. SEEK MEDICAL CARE IF:  You experience pain that is progressive and unresponsive to any pain medicine you have been prescribed. SEEK IMMEDIATE MEDICAL CARE IF:   Pain cannot be controlled with the prescribed medicine.  You have a fever or shaking chills.  The severity or intensity of pain increases over 18 hours and is not relieved by pain medicine.  You develop a new onset of abdominal pain.  You feel faint or pass out.  You are unable to urinate.   This information is not intended to replace advice given to you by your health care provider. Make sure you discuss any questions you have with your health care provider.   Document Released: 04/29/2005 Document Revised: 01/18/2015 Document Reviewed: 09/30/2012 Elsevier Interactive  Patient Education [employer].

## 2015-09-08 NOTE — ED Notes (Signed)
Pt states she had [place] at [facility] on 4/24 and was told she has kidney stones. Pt also reports decreased urinary output x 1 week. Pt denies pain at present. nad noted.

## 2015-09-10 LAB — URINE CULTURE: Culture: 3000 — AB

## 2015-09-11 DIAGNOSIS — Z9289 Personal history of other medical treatment: Secondary | ICD-10-CM

## 2015-09-11 HISTORY — DX: Personal history of other medical treatment: Z92.89

## 2015-09-15 ENCOUNTER — Observation Stay (HOSPITAL_COMMUNITY)
Admission: EM | Admit: 2015-09-15 | Discharge: 2015-09-18 | Disposition: A | Discharge status: Home / Self Care | Payer: Medicare Other | Attending: Internal Medicine | Admitting: Internal Medicine

## 2015-09-15 ENCOUNTER — Encounter (HOSPITAL_COMMUNITY): Payer: Self-pay | Admitting: Cardiology

## 2015-09-15 ENCOUNTER — Emergency Department (HOSPITAL_COMMUNITY): Payer: Medicare Other

## 2015-09-15 DIAGNOSIS — R05 Cough: Secondary | ICD-10-CM

## 2015-09-15 DIAGNOSIS — Z79899 Other long term (current) drug therapy: Secondary | ICD-10-CM | POA: Diagnosis not present

## 2015-09-15 DIAGNOSIS — N184 Chronic kidney disease, stage 4 (severe): Secondary | ICD-10-CM | POA: Diagnosis present

## 2015-09-15 DIAGNOSIS — F17210 Nicotine dependence, cigarettes, uncomplicated: Secondary | ICD-10-CM | POA: Diagnosis not present

## 2015-09-15 DIAGNOSIS — I951 Orthostatic hypotension: Secondary | ICD-10-CM | POA: Diagnosis not present

## 2015-09-15 DIAGNOSIS — Z955 Presence of coronary angioplasty implant and graft: Secondary | ICD-10-CM | POA: Diagnosis not present

## 2015-09-15 DIAGNOSIS — N390 Urinary tract infection, site not specified: Secondary | ICD-10-CM | POA: Diagnosis not present

## 2015-09-15 DIAGNOSIS — J449 Chronic obstructive pulmonary disease, unspecified: Secondary | ICD-10-CM | POA: Insufficient documentation

## 2015-09-15 DIAGNOSIS — I214 Non-ST elevation (NSTEMI) myocardial infarction: Secondary | ICD-10-CM | POA: Diagnosis not present

## 2015-09-15 DIAGNOSIS — M3500 Sicca syndrome, unspecified: Secondary | ICD-10-CM | POA: Diagnosis present

## 2015-09-15 DIAGNOSIS — R0789 Other chest pain: Secondary | ICD-10-CM | POA: Diagnosis present

## 2015-09-15 DIAGNOSIS — I2510 Atherosclerotic heart disease of native coronary artery without angina pectoris: Secondary | ICD-10-CM | POA: Diagnosis not present

## 2015-09-15 DIAGNOSIS — R079 Chest pain, unspecified: Secondary | ICD-10-CM | POA: Diagnosis not present

## 2015-09-15 DIAGNOSIS — E785 Hyperlipidemia, unspecified: Secondary | ICD-10-CM | POA: Diagnosis not present

## 2015-09-15 DIAGNOSIS — R509 Fever, unspecified: Secondary | ICD-10-CM | POA: Diagnosis present

## 2015-09-15 DIAGNOSIS — N183 Chronic kidney disease, stage 3 (moderate): Secondary | ICD-10-CM | POA: Insufficient documentation

## 2015-09-15 DIAGNOSIS — R059 Cough, unspecified: Secondary | ICD-10-CM

## 2015-09-15 DIAGNOSIS — I10 Essential (primary) hypertension: Secondary | ICD-10-CM | POA: Diagnosis present

## 2015-09-15 DIAGNOSIS — D649 Anemia, unspecified: Secondary | ICD-10-CM | POA: Diagnosis not present

## 2015-09-15 DIAGNOSIS — M329 Systemic lupus erythematosus, unspecified: Secondary | ICD-10-CM | POA: Diagnosis present

## 2015-09-15 LAB — COMPREHENSIVE METABOLIC PANEL
ALBUMIN: 2.9 g/dL — AB (ref 3.5–5.0)
ALT: 16 U/L (ref 14–54)
AST: 18 U/L (ref 15–41)
Alkaline Phosphatase: 52 U/L (ref 38–126)
Anion gap: 7 (ref 5–15)
BUN: 25 mg/dL — AB (ref 6–20)
CHLORIDE: 111 mmol/L (ref 101–111)
CO2: 19 mmol/L — AB (ref 22–32)
CREATININE: 2.26 mg/dL — AB (ref 0.44–1.00)
Calcium: 8.8 mg/dL — ABNORMAL LOW (ref 8.9–10.3)
GFR calc Af Amer: 24 mL/min — ABNORMAL LOW (ref >60)
GFR calc non Af Amer: 20 mL/min — ABNORMAL LOW (ref >60)
Glucose, Bld: 86 mg/dL (ref 65–99)
POTASSIUM: 4.1 mmol/L (ref 3.5–5.1)
SODIUM: 137 mmol/L (ref 135–145)
Total Bilirubin: 0.2 mg/dL — ABNORMAL LOW (ref 0.3–1.2)
Total Protein: 7.6 g/dL (ref 6.5–8.1)

## 2015-09-15 LAB — TROPONIN I
TROPONIN I: 0.04 ng/mL — AB (ref <0.031)
TROPONIN I: 0.04 ng/mL — AB (ref <0.031)

## 2015-09-15 LAB — CBC WITH DIFFERENTIAL/PLATELET
BASOS ABS: 0 10*3/uL (ref 0.0–0.1)
BASOS PCT: 0 %
EOS PCT: 1 %
Eosinophils Absolute: 0.1 10*3/uL (ref 0.0–0.7)
HCT: 28.2 % — ABNORMAL LOW (ref 36.0–46.0)
Hemoglobin: 9.1 g/dL — ABNORMAL LOW (ref 12.0–15.0)
LYMPHS PCT: 13 %
Lymphs Abs: 1 10*3/uL (ref 0.7–4.0)
MCH: 29.8 pg (ref 26.0–34.0)
MCHC: 32.3 g/dL (ref 30.0–36.0)
MCV: 92.5 fL (ref 78.0–100.0)
Monocytes Absolute: 0.8 10*3/uL (ref 0.1–1.0)
Monocytes Relative: 10 %
NEUTROS ABS: 5.6 10*3/uL (ref 1.7–7.7)
Neutrophils Relative %: 76 %
PLATELETS: 192 10*3/uL (ref 150–400)
RBC: 3.05 MIL/uL — AB (ref 3.87–5.11)
RDW: 15.4 % (ref 11.5–15.5)
WBC: 7.4 10*3/uL (ref 4.0–10.5)

## 2015-09-15 LAB — PROTIME-INR
INR: 1.16 (ref 0.00–1.49)
PROTHROMBIN TIME: 15 s (ref 11.6–15.2)

## 2015-09-15 LAB — URINALYSIS, ROUTINE W REFLEX MICROSCOPIC
BILIRUBIN URINE: NEGATIVE
Glucose, UA: NEGATIVE mg/dL
KETONES UR: NEGATIVE mg/dL
NITRITE: NEGATIVE
PH: 6 (ref 5.0–8.0)
Protein, ur: 100 mg/dL — AB
Specific Gravity, Urine: 1.025 (ref 1.005–1.030)

## 2015-09-15 LAB — I-STAT TROPONIN, ED: TROPONIN I, POC: 0.04 ng/mL (ref 0.00–0.08)

## 2015-09-15 LAB — I-STAT CG4 LACTIC ACID, ED: LACTIC ACID, VENOUS: 0.96 mmol/L (ref 0.5–2.0)

## 2015-09-15 LAB — URINE MICROSCOPIC-ADD ON

## 2015-09-15 MED ORDER — ONDANSETRON HCL 4 MG PO TABS: 4.0000 mg | ORAL_TABLET | Freq: Four times a day (QID) | ORAL | Status: DC | PRN

## 2015-09-15 MED ORDER — ATORVASTATIN CALCIUM 40 MG PO TABS
40.0000 mg | ORAL_TABLET | Freq: Every morning | ORAL | Status: DC
  Administered 2015-09-16 – 2015-09-18 (×3): 40 mg via ORAL
  Filled 2015-09-15 (×3): qty 1

## 2015-09-15 MED ORDER — CHOLECALCIFEROL 10 MCG (400 UNIT) PO TABS
400.0000 [IU] | ORAL_TABLET | Freq: Every evening | ORAL | Status: DC
  Administered 2015-09-16 – 2015-09-17 (×2): 400 [IU] via ORAL
  Filled 2015-09-15 (×3): qty 1

## 2015-09-15 MED ORDER — NITROGLYCERIN 0.4 MG SL SUBL: 0.4000 mg | SUBLINGUAL_TABLET | SUBLINGUAL | Status: DC | PRN

## 2015-09-15 MED ORDER — ALBUTEROL SULFATE HFA 108 (90 BASE) MCG/ACT IN AERS: 2.0000 | INHALATION_SPRAY | Freq: Four times a day (QID) | RESPIRATORY_TRACT | Status: DC | PRN

## 2015-09-15 MED ORDER — SODIUM CHLORIDE 0.9 % IV BOLUS (SEPSIS)
1000.0000 mL | Freq: Once | INTRAVENOUS | Status: AC
  Administered 2015-09-15: 1000 mL via INTRAVENOUS

## 2015-09-15 MED ORDER — ONDANSETRON HCL 4 MG/2ML IJ SOLN: 4.0000 mg | Freq: Four times a day (QID) | INTRAMUSCULAR | Status: DC | PRN

## 2015-09-15 MED ORDER — HEPARIN BOLUS VIA INFUSION
4000.0000 [IU] | Freq: Once | INTRAVENOUS | Status: AC
  Administered 2015-09-15: 4000 [IU] via INTRAVENOUS

## 2015-09-15 MED ORDER — SODIUM CHLORIDE 0.9 % IV SOLN
INTRAVENOUS | Status: AC
  Administered 2015-09-15: 21:00:00 via INTRAVENOUS

## 2015-09-15 MED ORDER — FOLIC ACID 1 MG PO TABS
1.0000 mg | ORAL_TABLET | Freq: Every morning | ORAL | Status: DC
  Administered 2015-09-16 – 2015-09-18 (×3): 1 mg via ORAL
  Filled 2015-09-15 (×3): qty 1

## 2015-09-15 MED ORDER — AMITRIPTYLINE HCL 100 MG PO TABS
100.0000 mg | ORAL_TABLET | Freq: Every day | ORAL | Status: DC
  Administered 2015-09-15 – 2015-09-17 (×3): 100 mg via ORAL
  Filled 2015-09-15: qty 4
  Filled 2015-09-15: qty 1
  Filled 2015-09-15: qty 4
  Filled 2015-09-15 (×3): qty 1
  Filled 2015-09-15: qty 4

## 2015-09-15 MED ORDER — ACETAMINOPHEN 325 MG PO TABS
650.0000 mg | ORAL_TABLET | Freq: Four times a day (QID) | ORAL | Status: DC | PRN
  Administered 2015-09-17: 650 mg via ORAL
  Filled 2015-09-15: qty 2

## 2015-09-15 MED ORDER — HYDROCODONE-ACETAMINOPHEN 5-325 MG PO TABS
1.0000 | ORAL_TABLET | Freq: Three times a day (TID) | ORAL | Status: DC
  Administered 2015-09-15 – 2015-09-18 (×8): 1 via ORAL
  Filled 2015-09-15 (×8): qty 1

## 2015-09-15 MED ORDER — LEVOTHYROXINE SODIUM 25 MCG PO TABS
25.0000 ug | ORAL_TABLET | Freq: Every day | ORAL | Status: DC
  Administered 2015-09-16 – 2015-09-18 (×3): 25 ug via ORAL
  Filled 2015-09-15 (×3): qty 1

## 2015-09-15 MED ORDER — ALBUTEROL SULFATE (2.5 MG/3ML) 0.083% IN NEBU: 2.5000 mg | INHALATION_SOLUTION | Freq: Four times a day (QID) | RESPIRATORY_TRACT | Status: DC | PRN

## 2015-09-15 MED ORDER — ACETAMINOPHEN 650 MG RE SUPP: 650.0000 mg | Freq: Four times a day (QID) | RECTAL | Status: DC | PRN

## 2015-09-15 MED ORDER — ASPIRIN 81 MG PO CHEW
324.0000 mg | CHEWABLE_TABLET | Freq: Once | ORAL | Status: AC
  Administered 2015-09-15: 324 mg via ORAL
  Filled 2015-09-15: qty 4

## 2015-09-15 MED ORDER — ASPIRIN EC 325 MG PO TBEC
325.0000 mg | DELAYED_RELEASE_TABLET | Freq: Every day | ORAL | Status: DC
  Administered 2015-09-16 – 2015-09-18 (×2): 325 mg via ORAL
  Filled 2015-09-15 (×3): qty 1

## 2015-09-15 MED ORDER — SODIUM BICARBONATE 650 MG PO TABS
650.0000 mg | ORAL_TABLET | Freq: Three times a day (TID) | ORAL | Status: DC
  Administered 2015-09-15 – 2015-09-18 (×8): 650 mg via ORAL
  Filled 2015-09-15 (×8): qty 1

## 2015-09-15 MED ORDER — LATANOPROST 0.005 % OP SOLN
1.0000 [drp] | Freq: Every day | OPHTHALMIC | Status: DC
  Administered 2015-09-15 – 2015-09-17 (×3): 1 [drp] via OPHTHALMIC
  Filled 2015-09-15: qty 2.5

## 2015-09-15 MED ORDER — NITROGLYCERIN 0.4 MG SL SUBL
0.4000 mg | SUBLINGUAL_TABLET | SUBLINGUAL | Status: AC | PRN
  Administered 2015-09-15 (×3): 0.4 mg via SUBLINGUAL
  Filled 2015-09-15: qty 1

## 2015-09-15 MED ORDER — POLYSACCHARIDE IRON COMPLEX 150 MG PO CAPS
150.0000 mg | ORAL_CAPSULE | Freq: Two times a day (BID) | ORAL | Status: DC
  Administered 2015-09-15 – 2015-09-18 (×6): 150 mg via ORAL
  Filled 2015-09-15 (×6): qty 1

## 2015-09-15 MED ORDER — PANTOPRAZOLE SODIUM 40 MG PO TBEC
40.0000 mg | DELAYED_RELEASE_TABLET | Freq: Every day | ORAL | Status: DC
  Administered 2015-09-16 – 2015-09-18 (×3): 40 mg via ORAL
  Filled 2015-09-15 (×3): qty 1

## 2015-09-15 MED ORDER — DEXTROSE 5 % IV SOLN
1.0000 g | Freq: Once | INTRAVENOUS | Status: AC
  Administered 2015-09-15: 1 g via INTRAVENOUS
  Filled 2015-09-15: qty 10

## 2015-09-15 MED ORDER — ALPRAZOLAM 0.5 MG PO TABS
0.5000 mg | ORAL_TABLET | Freq: Three times a day (TID) | ORAL | Status: DC
  Administered 2015-09-15 – 2015-09-18 (×8): 0.5 mg via ORAL
  Filled 2015-09-15 (×8): qty 1

## 2015-09-15 MED ORDER — HEPARIN (PORCINE) IN NACL 100-0.45 UNIT/ML-% IJ SOLN
700.0000 [IU]/h | INTRAMUSCULAR | Status: DC
  Administered 2015-09-15: 1000 [IU]/h via INTRAVENOUS
  Filled 2015-09-15: qty 250

## 2015-09-15 MED ORDER — LEVOTHYROXINE SODIUM 25 MCG PO TABS: 25.0000 ug | ORAL_TABLET | Freq: Every morning | ORAL | Status: DC

## 2015-09-15 MED ORDER — HYDROXYCHLOROQUINE SULFATE 200 MG PO TABS
400.0000 mg | ORAL_TABLET | Freq: Every day | ORAL | Status: DC
  Administered 2015-09-16 – 2015-09-18 (×3): 400 mg via ORAL
  Filled 2015-09-15 (×3): qty 2

## 2015-09-15 NOTE — Progress Notes (Addendum)
ANTICOAGULATION CONSULT NOTE - Initial Consult  Pharmacy Consult for Heparin Indication: chest pain/ACS  Allergies  Allergen Reactions  . Florinef [Fludrocortisone Acetate]     Malaise: Discomfort   . Iohexol Other (See Comments)    Pt. States that Dr. Florene Glen her kidney Dr. Does not want her to receive iv dye due to increased risk of kidney failure.   . Sulfonamide Derivatives Hives  . Tape Rash    ADHESIVE    Patient Measurements: Height: 5\' 9"  (175.3 cm) Weight: 124 lb 1.6 oz (56.291 kg) IBW/kg (Calculated) : 66.2 Heparin Dosing Weight: 56.3 kg  Vital Signs: Temp: 98.7 F (37.1 C) (05/05 1927) Temp Source: Oral (05/05 1927) BP: 127/69 mmHg (05/05 1930) Pulse Rate: 74 (05/05 1930)  Labs:  Recent Labs  09/15/15 1410 09/15/15 1440 09/15/15 1559  HGB 9.1*  --   --   HCT 28.2*  --   --   PLT 192  --   --   LABPROT  --  15.0  --   INR  --  1.16  --   CREATININE 2.26*  --   --   TROPONINI 0.04*  --  0.04*    Estimated Creatinine Clearance: 19.7 mL/min (by C-G formula based on Cr of 2.26).   Medical History: Past Medical History  Diagnosis Date  . Lymphoma ([place])   . Systemic lupus erythematosus ([place])   . Depression   . Hyperlipidemia   . CAD (coronary artery disease)     DES to mid RCA 2006  . Sjogren's syndrome ([place])   . Orthostatic hypotension   . Dysphagia   . Syncope   . Anemia   . CKD (chronic kidney disease) stage 3, GFR 30-59 ml/min   . COPD (chronic obstructive pulmonary disease) ([place])   . PMB (postmenopausal bleeding) 06/12/2015    Medications:  Infusions:  . sodium chloride 75 mL/hr at 09/15/15 2107  . heparin 1,000 Units/hr (09/15/15 1744)    Assessment: 74 yof transferred from [facility] on IV heparin drip at 1000 units/hr after 4000 unit bolus per MD dosing. Per Dr. Hal Hope, continue IV heparin drip per pharmacy dosing for ACS. Baseline INR wnl.   Heparin drip rate is high for this patient's dosing weight at 17  units/kg/hr. Will reduce rate to prevent bleed risk and recheck in 8 hours. Relayed information to [place], RN-- no bleeding or issues with infusion.   Goal of Therapy:  Heparin level 0.3-0.7 units/ml Monitor platelets by anticoagulation protocol: Yes   Plan:  Reduce IV heparin to 700 units/hr. (~12 units/kg/hr in-line with ACS protocol) Heparin level in 8 hours- ok with AM labs Daily heparin level and CBC while on therapy.   Sloan Leiter, PharmD, BCPS Clinical Pharmacist [phone] 09/15/2015,9:35 PM

## 2015-09-15 NOTE — ED Notes (Signed)
Pt and family updated of admission to [facility]. Pt currently denies any 'chest pressure'.  Daughter Angie's contact info: [phone]

## 2015-09-15 NOTE — ED Provider Notes (Signed)
CSN: [id number]     Arrival date & time 09/15/15  1349 History   First MD Initiated Contact with Patient 09/15/15 1404     Chief Complaint  Patient presents with  . Chest Pain     (Consider location/radiation/quality/duration/timing/severity/associated sxs/prior Treatment) HPI  75 year old female history of orthostatic hypotension, lymphoma, multiple autoimmune disorders, MI status post stent who presents today needing of feeling like she is going to pass out when she stands up. She states that she has had a cough productive of sputum subjective fever and chills. This has been present for 1-2 days. Today she has noted some left sided anterior chest pain that feels like pressure. She describes it as being present when she awoke this morning. She has not taken anything to improve it or that has made it worse. She states pain has decreased from 8 to a 5 out of 10.  Past Medical History  Diagnosis Date  . Lymphoma ([place])   . Systemic lupus erythematosus ([place])   . Depression   . Hyperlipidemia   . CAD (coronary artery disease)     DES to mid RCA 2006  . Sjogren's syndrome ([place])   . Orthostatic hypotension   . Dysphagia   . Syncope   . Anemia   . CKD (chronic kidney disease) stage 3, GFR 30-59 ml/min   . COPD (chronic obstructive pulmonary disease) ([place])   . PMB (postmenopausal bleeding) 06/12/2015   Past Surgical History  Procedure Laterality Date  . Breast excisional biopsy Left   . Eye surgery    . Mass excision      Lymphoma  . Coronary angioplasty with stent placement    . Cholecystectomy    . Cataract extraction Bilateral   . Neck surgery     Family History  Problem Relation Age of Onset  . Coronary artery disease Father   . Coronary artery disease Mother   . Liver disease Mother   . Esophageal cancer Brother   . Breast cancer Sister    Social History  Substance Use Topics  . Smoking status: Current Every Day Smoker -- 0.25 packs/day for 23 years    Types: Cigarettes     Start date: 07/14/1986  . Smokeless tobacco: Never Used  . Alcohol Use: No   OB History    Gravida Para Term Preterm AB TAB SAB Ectopic Multiple Living   3 3        3      Review of Systems    Allergies  Florinef; Iohexol; Sulfonamide derivatives; and Tape  Home Medications   Prior to Admission medications   Medication Sig Start Date End Date Taking? Authorizing Provider  albuterol (PROVENTIL HFA;VENTOLIN HFA) 108 (90 BASE) MCG/ACT inhaler Inhale 2 puffs into the lungs every 6 (six) hours as needed for wheezing or shortness of breath. 10/24/13   Nita Sells, MD  ALPRAZolam Duanne Moron) 0.5 MG tablet Take 0.5 mg by mouth 3 (three) times daily.     Historical Provider, MD  amitriptyline (ELAVIL) 100 MG tablet Take 100 mg by mouth daily. 07/21/14   Historical Provider, MD  Artificial Tear Solution (SOOTHE XP OP) Apply 1 drop to eye 3 (three) times daily.     Historical Provider, MD  aspirin EC 81 MG tablet Take 81 mg by mouth at bedtime.     Historical Provider, MD  atorvastatin (LIPITOR) 40 MG tablet Take 1 tablet (40 mg total) by mouth at bedtime. 04/21/13   Lendon Colonel, NP  cephALEXin [facility])  250 MG capsule Take 1 capsule (250 mg total) by mouth 4 (four) times daily. 09/08/15   Daleen Bo, MD  Cholecalciferol (VITAMIN D) 400 UNITS capsule Take 400 Units by mouth every evening.     Historical Provider, MD  clobetasol cream (TEMOVATE) [id number] % Apply 1 application topically 2 (two) times daily.    Historical Provider, MD  Cyanocobalamin (B-12 PO) Take 1 tablet by mouth daily.    Historical Provider, MD  folic acid (FOLVITE) 1 MG tablet Take 1 mg by mouth every morning.     Historical Provider, MD  furosemide (LASIX) 20 MG tablet TAKE 1 TABLET (20 MG TOTAL) BY MOUTH EVERY OTHER DAY AS NEEDED FOR EDEMA 03/17/15   Satira Sark, MD  HYDROcodone-acetaminophen (NORCO/VICODIN) 5-325 MG per tablet Take 1 tablet by mouth 3 (three) times daily.     Historical Provider, MD   hydroxychloroquine (PLAQUENIL) 200 MG tablet Take 400 mg by mouth daily. To be taken with food or milk 02/07/13   Historical Provider, MD  iron polysaccharides (NIFEREX) 150 MG capsule Take 150 mg by mouth 2 (two) times daily.     Historical Provider, MD  L-LYSINE PO Take 1 tablet by mouth 2 (two) times daily.     Historical Provider, MD  levothyroxine (SYNTHROID, LEVOTHROID) 25 MCG tablet Take 25 mcg by mouth every morning.     Historical Provider, MD  medroxyPROGESTERone (PROVERA) 10 MG tablet Take 1 tablet (10 mg total) by mouth daily. 06/21/15   Florian Buff, MD  nitroGLYCERIN (NITROSTAT) 0.4 MG SL tablet Place 1 tablet (0.4 mg total) under the tongue every 5 (five) minutes as needed. 05/20/11   Lendon Colonel, NP  omeprazole (PRILOSEC) 20 MG capsule Take 20 mg by mouth 2 (two) times daily.     Historical Provider, MD  raloxifene (EVISTA) 60 MG tablet Take 60 mg by mouth every morning.     Historical Provider, MD  sodium bicarbonate 325 MG tablet Take 650 mg by mouth 3 (three) times daily.     Historical Provider, MD  Travoprost, BAK Free, (TRAVATAN) 0.004 % SOLN ophthalmic solution Place 1 drop into both eyes at bedtime.    Historical Provider, MD   BP 136/69 mmHg  Pulse 91  Temp(Src) 97.8 F (36.6 C)  Resp 16  Ht 5\' 10"  (1.778 m)  Wt 56.7 kg  BMI 17.94 kg/m2  SpO2 100% Physical Exam  Constitutional: She is oriented to person, place, and time. She appears well-developed and well-nourished.  HENT:  Head: Normocephalic and atraumatic.  Right Ear: External ear normal.  Left Ear: External ear normal.  Nose: Nose normal.  Mouth/Throat: Oropharynx is clear and moist.  Eyes: Conjunctivae are normal. Pupils are equal, round, and reactive to light.  Neck: Normal range of motion. Neck supple.  Cardiovascular: Normal rate and regular rhythm.   Pulmonary/Chest: Effort normal and breath sounds normal.  Abdominal: Soft. Bowel sounds are normal.  Musculoskeletal: Normal range of motion. She  exhibits no edema or tenderness.  Neurological: She is alert and oriented to person, place, and time. She displays normal reflexes. No cranial nerve deficit. Coordination normal.  Skin: Skin is warm and dry.  Psychiatric: She has a normal mood and affect. Her behavior is normal.  Nursing note and vitals reviewed.   ED Course  Procedures (including critical care time) Labs Review Labs Reviewed  CBC WITH DIFFERENTIAL/PLATELET - Abnormal; Notable for the following:    RBC 3.05 (*)    Hemoglobin 9.1 (*)  HCT 28.2 (*)    All other components within normal limits  CULTURE, BLOOD (ROUTINE X 2)  CULTURE, BLOOD (ROUTINE X 2)  COMPREHENSIVE METABOLIC PANEL  TROPONIN I  PROTIME-INR  URINALYSIS, ROUTINE W REFLEX MICROSCOPIC (NOT AT [facility])  I-STAT TROPOININ, ED  I-STAT CG4 LACTIC ACID, ED    Imaging Review Dg Chest 2 View  09/15/2015  CLINICAL DATA:  Chest pain, weakness for 2 weeks, low blood pressure EXAM: CHEST  2 VIEW COMPARISON:  11/15/2014 FINDINGS: Cardiomediastinal silhouette is stable. Mild hyperinflation. No infiltrate or pleural effusion. No pulmonary edema. Osteopenia and mild degenerative changes thoracic spine. IMPRESSION: No active cardiopulmonary disease. Electronically Signed   By: Lahoma Crocker M.D.   On: 09/15/2015 16:01   I have personally reviewed and evaluated these images and lab results as part of my medical decision-making.   EKG Interpretation   Date/Time:  Friday Sep 15 2015 14:10:32 EDT Ventricular Rate:  92 PR Interval:  171 QRS Duration: 102 QT Interval:  405 QTC Calculation: 501 R Axis:   86 Text Interpretation:  Normal sinus rhythm Inferior Q waves noted Poor R  wave progression Confirmed by Jeannelle Wiens MD, Wen Merced ([id number]) on 09/15/2015 2:21:26  PM      MDM   Final diagnoses:  NSTEMI (non-ST elevated myocardial infarction) [facility])  UTI (lower urinary tract infection)    75 year old female presents today with weakness, lightheadedness, and cough. She also  has had left chest pain that was present on awakening this morning. Chest pain without acute ekg changes.  Pain resolved here after aspirin.  Patient with mildly elevated troponin at .04.    Plan admission for treatment of uti and nstemi.   1- chest pain - ekg abnormality as noted above and elevated troponin, heparin started after asa given.  2- uti 3- renal insufficiency- previous creatinine 2.22 now 2.26 with gradual increase 4 anemia- gradually trending down hemoglobin now at 9.6  Discussed with Drs. Crenshaw and Hag[place].  Plan transfer to [facility] step down.   Pattricia Boss, MD 09/15/15 [phone]

## 2015-09-15 NOTE — ED Notes (Signed)
Report to Jennye Moccasin

## 2015-09-15 NOTE — H&P (Signed)
History and Physical    Tiffany [name] [id number] DOB: 1940-09-11 DOA: 09/15/2015  Referring MD/NP/PA: Dr. Jeanell Sparrow. PCP: No PCP Per Patient  Outpatient Specialists: Dr. Netta Neat. Cardiologist. Patient coming from: Home.  Chief Complaint: Chest pain.  HPI: Tiffany [name] is a 75 y.o. female with medical history significant of CAD, lupus, Sjogren's syndrome, lymphoma, chronic kidney disease, hyperlipidemia presents to the ER because of chest pain. Patient started developing chest pressure on the left side of the chest since waking up in the morning. Denies any associated shortness of breath or productive cough. Patient did feel dizzy in the morning on standing up. In the ER patient is found to be mildly orthostatic and was given fluid bolus. EKG shows normal sinus rhythm with poor R-wave progression and Q waves in inferior leads. Troponin is mildly positive. Patient's chest pain improved with nitroglycerin but did recur and had to be given nitroglycerin again following with chest pain resolved. Patient was started on heparin infusion. ER physician did discuss with on-call cardiologist at [facility] Dr. Stanford Breed. Says there will be no cardiologist at [facility] over the weekend patient will be transferred to [facility]. Patient is also found to be mildly febrile for which blood cultures were sent and fluid bolus was given since patient was orthostatic. Likely source for fever could be UTI.  ED Course: Patient was started on heparin and antibiotics.  Review of Systems: As per HPI otherwise 10 point review of systems negative.    Past Medical History  Diagnosis Date  . Lymphoma ([place])   . Systemic lupus erythematosus ([place])   . Depression   . Hyperlipidemia   . CAD (coronary artery disease)     DES to mid RCA 2006  . Sjogren's syndrome ([place])   . Orthostatic hypotension   . Dysphagia   . Syncope   . Anemia   . CKD (chronic kidney disease) stage 3, GFR 30-59 ml/min     . COPD (chronic obstructive pulmonary disease) ([place])   . PMB (postmenopausal bleeding) 06/12/2015    Past Surgical History  Procedure Laterality Date  . Breast excisional biopsy Left   . Eye surgery    . Mass excision      Lymphoma  . Coronary angioplasty with stent placement    . Cholecystectomy    . Cataract extraction Bilateral   . Neck surgery       reports that she has been smoking Cigarettes.  She started smoking about 29 years ago. She has a 5.75 pack-year smoking history. She has never used smokeless tobacco. She reports that she does not drink alcohol or use illicit drugs.  Allergies  Allergen Reactions  . Florinef [Fludrocortisone Acetate]     Malaise: Discomfort   . Iohexol Other (See Comments)    Pt. States that Dr. Florene Glen her kidney Dr. Does not want her to receive iv dye due to increased risk of kidney failure.   . Sulfonamide Derivatives Hives  . Tape Rash    ADHESIVE    Family History  Problem Relation Age of Onset  . Coronary artery disease Father   . Coronary artery disease Mother   . Liver disease Mother   . Esophageal cancer Brother   . Breast cancer Sister     Prior to Admission medications   Medication Sig Start Date End Date Taking? Authorizing Provider  albuterol (PROVENTIL HFA;VENTOLIN HFA) 108 (90 BASE) MCG/ACT inhaler Inhale 2 puffs into the lungs every 6 (six)  hours as needed for wheezing or shortness of breath. 10/24/13  Yes Nita Sells, MD  ALPRAZolam Duanne Moron) 0.5 MG tablet Take 0.5 mg by mouth 3 (three) times daily.    Yes Historical Provider, MD  amitriptyline (ELAVIL) 100 MG tablet Take 100 mg by mouth daily. 07/21/14  Yes Historical Provider, MD  Artificial Tear Solution (SOOTHE XP OP) Apply 1 drop to eye 3 (three) times daily.    Yes Historical Provider, MD  aspirin EC 81 MG tablet Take 81 mg by mouth at bedtime.    Yes Historical Provider, MD  atorvastatin (LIPITOR) 40 MG tablet Take 1 tablet (40 mg total) by mouth at  bedtime. Patient taking differently: Take 40 mg by mouth every morning.  04/21/13  Yes Lendon Colonel, NP  cephALEXin (KEFLEX) 250 MG capsule Take 1 capsule (250 mg total) by mouth 4 (four) times daily. 09/08/15  Yes Daleen Bo, MD  Cholecalciferol (VITAMIN D) 400 UNITS capsule Take 400 Units by mouth every evening.    Yes Historical Provider, MD  clobetasol cream (TEMOVATE) [id number] % Apply 1 application topically 2 (two) times daily.   Yes Historical Provider, MD  Cyanocobalamin (B-12 PO) Take 1 tablet by mouth daily.   Yes Historical Provider, MD  folic acid (FOLVITE) 1 MG tablet Take 1 mg by mouth every morning.    Yes Historical Provider, MD  furosemide (LASIX) 20 MG tablet TAKE 1 TABLET (20 MG TOTAL) BY MOUTH EVERY OTHER DAY AS NEEDED FOR EDEMA 03/17/15  Yes Satira Sark, MD  HYDROcodone-acetaminophen (NORCO/VICODIN) 5-325 MG per tablet Take 1 tablet by mouth 3 (three) times daily.    Yes Historical Provider, MD  hydroxychloroquine (PLAQUENIL) 200 MG tablet Take 400 mg by mouth daily. To be taken with food or milk 02/07/13  Yes Historical Provider, MD  iron polysaccharides (NIFEREX) 150 MG capsule Take 150 mg by mouth 2 (two) times daily.    Yes Historical Provider, MD  L-LYSINE PO Take 1 tablet by mouth 2 (two) times daily.    Yes Historical Provider, MD  levothyroxine (SYNTHROID, LEVOTHROID) 25 MCG tablet Take 25 mcg by mouth every morning.    Yes Historical Provider, MD  medroxyPROGESTERone (PROVERA) 10 MG tablet Take 1 tablet (10 mg total) by mouth daily. 06/21/15  Yes Florian Buff, MD  nitroGLYCERIN (NITROSTAT) 0.4 MG SL tablet Place 1 tablet (0.4 mg total) under the tongue every 5 (five) minutes as needed. 05/20/11  Yes Lendon Colonel, NP  omeprazole (PRILOSEC) 20 MG capsule Take 20 mg by mouth 2 (two) times daily.    Yes Historical Provider, MD  Phenylephrine-DM-GG-APAP ([place] FAST-MAX) 10-20-400-650 MG PACK Take 10-15 mLs by mouth every 6 (six) hours as needed (nasal  congestion).   Yes Historical Provider, MD  raloxifene (EVISTA) 60 MG tablet Take 60 mg by mouth every morning.    Yes Historical Provider, MD  sodium bicarbonate 325 MG tablet Take 650 mg by mouth 3 (three) times daily.    Yes Historical Provider, MD  Travoprost, BAK Free, (TRAVATAN) 0.004 % SOLN ophthalmic solution Place 1 drop into both eyes at bedtime.   Yes Historical Provider, MD    Physical Exam: Filed Vitals:   09/15/15 1750 09/15/15 1800 09/15/15 1830 09/15/15 1900  BP: 142/67 126/65 120/63 117/68  Pulse: 87 83 93 73  Temp:      TempSrc:      Resp: 22 12 14 15   Height:      Weight:  SpO2: 98% 97% 97% 94%      Constitutional: Appears normal. Filed Vitals:   09/15/15 1750 09/15/15 1800 09/15/15 1830 09/15/15 1900  BP: 142/67 126/65 120/63 117/68  Pulse: 87 83 93 73  Temp:      TempSrc:      Resp: 22 12 14 15   Height:      Weight:      SpO2: 98% 97% 97% 94%   Eyes: Anicteric no pallor. ENMT: No discharge from the ears eyes nose and mouth. Neck: No mass felt. No neck rigidity. Respiratory: No rhonchi or crepitations. Cardiovascular: S1 and S2 heard. Abdomen: Soft nontender bowel sounds present. Musculoskeletal: No edema. Skin: No rash. Neurologic: Alert awake oriented to time place and person. Moves all extremities. Psychiatric: Appears normal.   Labs on Admission: I have personally reviewed following labs and imaging studies  CBC:  Recent Labs Lab 09/15/15 1410  WBC 7.4  NEUTROABS 5.6  HGB 9.1*  HCT 28.2*  MCV 92.5  PLT [id number]   Basic Metabolic Panel:  Recent Labs Lab 09/15/15 1410  NA 137  K 4.1  CL 111  CO2 19*  GLUCOSE 86  BUN 25*  CREATININE 2.26*  CALCIUM 8.8*   GFR: Estimated Creatinine Clearance: 19.8 mL/min (by C-G formula based on Cr of 2.26). Liver Function Tests:  Recent Labs Lab 09/15/15 1410  AST 18  ALT 16  ALKPHOS 52  BILITOT 0.2*  PROT 7.6  ALBUMIN 2.9*   No results for input(s): LIPASE, AMYLASE in the last  168 hours. No results for input(s): AMMONIA in the last 168 hours. Coagulation Profile:  Recent Labs Lab 09/15/15 1440  INR 1.16   Cardiac Enzymes:  Recent Labs Lab 09/15/15 1410 09/15/15 1559  TROPONINI 0.04* 0.04*   BNP (last 3 results) No results for input(s): PROBNP in the last 8760 hours. HbA1C: No results for input(s): HGBA1C in the last 72 hours. CBG: No results for input(s): GLUCAP in the last 168 hours. Lipid Profile: No results for input(s): CHOL, HDL, LDLCALC, TRIG, CHOLHDL, LDLDIRECT in the last 72 hours. Thyroid Function Tests: No results for input(s): TSH, T4TOTAL, FREET4, T3FREE, THYROIDAB in the last 72 hours. Anemia Panel: No results for input(s): VITAMINB12, FOLATE, FERRITIN, TIBC, IRON, RETICCTPCT in the last 72 hours. Urine analysis:    Component Value Date/Time   COLORURINE YELLOW 09/15/2015 1516   APPEARANCEUR HAZY* 09/15/2015 1516   LABSPEC 1.025 09/15/2015 1516   PHURINE 6.0 09/15/2015 1516   GLUCOSEU NEGATIVE 09/15/2015 1516   HGBUR LARGE* 09/15/2015 [place] 09/15/2015 1516   KETONESUR NEGATIVE 09/15/2015 1516   PROTEINUR 100* 09/15/2015 1516   UROBILINOGEN 0.2 11/03/2014 1100   NITRITE NEGATIVE 09/15/2015 1516   LEUKOCYTESUR TRACE* 09/15/2015 1516   Sepsis Labs: @LABRCNTIP (procalcitonin:4,lacticidven:4) ) Recent Results (from the past 240 hour(s))  Urine culture     Status: Abnormal   Collection Time: 09/08/15  4:50 PM  Result Value Ref Range Status   Specimen Description URINE, CLEAN CATCH  Final   Special Requests NONE  Final   Culture (A)  Final    3,000 COLONIES/mL INSIGNIFICANT GROWTH Performed at [facility]    Report Status 09/10/2015 FINAL  Final  Blood culture (routine x 2)     Status: None (Preliminary result)   Collection Time: 09/15/15  2:40 PM  Result Value Ref Range Status   Specimen Description LEFT ANTECUBITAL  Final   Special Requests BOTTLES DRAWN AEROBIC AND ANAEROBIC [place]  Final  Culture PENDING  Incomplete   Report Status PENDING  Incomplete  Blood culture (routine x 2)     Status: None (Preliminary result)   Collection Time: 09/15/15  2:46 PM  Result Value Ref Range Status   Specimen Description RIGHT ANTECUBITAL  Final   Special Requests BOTTLES DRAWN AEROBIC AND ANAEROBIC [facility]  Final   Culture PENDING  Incomplete   Report Status PENDING  Incomplete     Radiological Exams on Admission: Dg Chest 2 View  09/15/2015  CLINICAL DATA:  Chest pain, weakness for 2 weeks, low blood pressure EXAM: CHEST  2 VIEW COMPARISON:  11/15/2014 FINDINGS: Cardiomediastinal silhouette is stable. Mild hyperinflation. No infiltrate or pleural effusion. No pulmonary edema. Osteopenia and mild degenerative changes thoracic spine. IMPRESSION: No active cardiopulmonary disease. Electronically Signed   By: Lahoma Crocker M.D.   On: 09/15/2015 16:01    EKG: Independently reviewed. Normal sinus rhythm with poor R-wave progression and Q waves in inferior leads.  Assessment/Plan Principal Problem:   NSTEMI (non-ST elevated myocardial infarction) ([place]) Active Problems:   Orthostatic hypotension   Sjogren's syndrome (HCC)   Hypertension   Chronic kidney disease   Systemic lupus erythematosus (HCC)   Fever   Non-ST elevated myocardial infarction ([place])    #1. Non-ST elevation MI/unstable angina with history of CAD status post stenting in 2007 - at this time patient has been started on heparin infusion and an aspirin. Patient is already on statins. Since patient's blood pressure is in the low normals no beta blocker started. Cycle cardiac markers checked d-dimer check 2-D echo keep patient nothing by mouth past midnight and consult cardiology again in a.m. #2. Fever - likely source could be UTI. Patient is on ceftriaxone follow cultures. #3. Orthostatic hypotension - patient has history of orthostatic hypotension. At this time a molding of patients Lasix and gently hydrating. #4. History of lupus  and Sjogren's syndrome on hydroxychloroquine. #5. Chronic kidney disease stage 3-4 - closely follow metabolic panel. #6. Hyperlipidemia on statins.   DVT prophylaxis: Heparin infusion. Code Status: Full code.  Family Communication: No family at the bedside.  Disposition Plan: Home.  Consults called: Will need to reconsult cardiology name.  Admission status: Observation. Stepdown unit.    Rise Patience MD [facility] Pager [phone].  If 7PM-7AM, please contact night-coverage [email or web address] Password [email or web address]  09/15/2015, 7:13 PM

## 2015-09-15 NOTE — ED Notes (Signed)
Chest pain that started around 10 this morning.  Also c/o blood pressure dropping when she get up.

## 2015-09-16 ENCOUNTER — Observation Stay (HOSPITAL_BASED_OUTPATIENT_CLINIC_OR_DEPARTMENT_OTHER): Payer: Medicare Other

## 2015-09-16 DIAGNOSIS — R079 Chest pain, unspecified: Secondary | ICD-10-CM | POA: Diagnosis not present

## 2015-09-16 DIAGNOSIS — N183 Chronic kidney disease, stage 3 (moderate): Secondary | ICD-10-CM

## 2015-09-16 DIAGNOSIS — I214 Non-ST elevation (NSTEMI) myocardial infarction: Secondary | ICD-10-CM | POA: Diagnosis not present

## 2015-09-16 DIAGNOSIS — D6489 Other specified anemias: Secondary | ICD-10-CM | POA: Diagnosis not present

## 2015-09-16 DIAGNOSIS — I951 Orthostatic hypotension: Secondary | ICD-10-CM

## 2015-09-16 DIAGNOSIS — N184 Chronic kidney disease, stage 4 (severe): Secondary | ICD-10-CM | POA: Diagnosis not present

## 2015-09-16 DIAGNOSIS — R072 Precordial pain: Secondary | ICD-10-CM

## 2015-09-16 DIAGNOSIS — R509 Fever, unspecified: Secondary | ICD-10-CM | POA: Diagnosis not present

## 2015-09-16 DIAGNOSIS — I10 Essential (primary) hypertension: Secondary | ICD-10-CM | POA: Diagnosis not present

## 2015-09-16 LAB — CBC WITH DIFFERENTIAL/PLATELET
BAND NEUTROPHILS: 4 %
HCT: 24.5 % — ABNORMAL LOW (ref 36.0–46.0)
Hemoglobin: 7.5 g/dL — ABNORMAL LOW (ref 12.0–15.0)
LYMPHS PCT: 20 %
MCH: 28.5 pg (ref 26.0–34.0)
MCHC: 30.6 g/dL (ref 30.0–36.0)
MCV: 93.2 fL (ref 78.0–100.0)
MONOS PCT: 8 %
NEUTROS PCT: 68 %
PLATELETS: 166 10*3/uL (ref 150–400)
RBC: 2.63 MIL/uL — ABNORMAL LOW (ref 3.87–5.11)
RDW: 15.4 % (ref 11.5–15.5)
WBC: 7.6 10*3/uL (ref 4.0–10.5)

## 2015-09-16 LAB — COMPREHENSIVE METABOLIC PANEL
ALBUMIN: 2.1 g/dL — AB (ref 3.5–5.0)
ALT: 13 U/L — ABNORMAL LOW (ref 14–54)
ANION GAP: 12 (ref 5–15)
AST: 15 U/L (ref 15–41)
Alkaline Phosphatase: 41 U/L (ref 38–126)
BUN: 22 mg/dL — ABNORMAL HIGH (ref 6–20)
CALCIUM: 8 mg/dL — AB (ref 8.9–10.3)
CO2: 14 mmol/L — ABNORMAL LOW (ref 22–32)
Chloride: 111 mmol/L (ref 101–111)
Creatinine, Ser: 1.94 mg/dL — ABNORMAL HIGH (ref 0.44–1.00)
GFR calc Af Amer: 28 mL/min — ABNORMAL LOW (ref >60)
GFR calc non Af Amer: 24 mL/min — ABNORMAL LOW (ref >60)
GLUCOSE: 75 mg/dL (ref 65–99)
POTASSIUM: 3.7 mmol/L (ref 3.5–5.1)
SODIUM: 137 mmol/L (ref 135–145)
TOTAL PROTEIN: 6.1 g/dL — AB (ref 6.5–8.1)
Total Bilirubin: 0.3 mg/dL (ref 0.3–1.2)

## 2015-09-16 LAB — TROPONIN I
TROPONIN I: 0.04 ng/mL — AB (ref <0.031)
Troponin I: 0.06 ng/mL — ABNORMAL HIGH (ref <0.031)
Troponin I: 0.04 ng/mL — ABNORMAL HIGH (ref <0.031)

## 2015-09-16 LAB — BRAIN NATRIURETIC PEPTIDE: B NATRIURETIC PEPTIDE 5: 187.6 pg/mL — AB (ref 0.0–100.0)

## 2015-09-16 LAB — MRSA PCR SCREENING: MRSA by PCR: POSITIVE — AB

## 2015-09-16 LAB — HEPARIN LEVEL (UNFRACTIONATED)
HEPARIN UNFRACTIONATED: 0.21 [IU]/mL — AB (ref 0.30–0.70)
Heparin Unfractionated: 0.31 IU/mL (ref 0.30–0.70)

## 2015-09-16 LAB — ECHOCARDIOGRAM COMPLETE
HEIGHTINCHES: 69 in
Weight: 1987.21 oz

## 2015-09-16 LAB — D-DIMER, QUANTITATIVE (NOT AT ARMC): D DIMER QUANT: 1.36 ug{FEU}/mL — AB (ref 0.00–0.50)

## 2015-09-16 MED ORDER — MUPIROCIN 2 % EX OINT
1.0000 "application " | TOPICAL_OINTMENT | Freq: Two times a day (BID) | CUTANEOUS | Status: DC
  Administered 2015-09-16 – 2015-09-18 (×5): 1 via NASAL
  Filled 2015-09-16 (×2): qty 22

## 2015-09-16 MED ORDER — CHLORHEXIDINE GLUCONATE CLOTH 2 % EX PADS
6.0000 | MEDICATED_PAD | Freq: Every day | CUTANEOUS | Status: DC
  Administered 2015-09-16 – 2015-09-18 (×3): 6 via TOPICAL

## 2015-09-16 MED ORDER — PHENYLEPHRINE HCL 0.5 % NA SOLN
1.0000 [drp] | Freq: Four times a day (QID) | NASAL | Status: DC | PRN
  Filled 2015-09-16: qty 15

## 2015-09-16 MED ORDER — DEXTROSE 5 % IV SOLN
1.0000 g | INTRAVENOUS | Status: DC
  Administered 2015-09-16 – 2015-09-17 (×2): 1 g via INTRAVENOUS
  Filled 2015-09-16 (×3): qty 10

## 2015-09-16 NOTE — Progress Notes (Signed)
ANTICOAGULATION CONSULT NOTE - Follow-up Consult  Pharmacy Consult for Heparin Indication: chest pain/ACS  Allergies  Allergen Reactions  . Florinef [Fludrocortisone Acetate]     Malaise: Discomfort   . Iohexol Other (See Comments)    Pt. States that Dr. Florene Glen her kidney Dr. Does not want her to receive iv dye due to increased risk of kidney failure.   . Sulfonamide Derivatives Hives  . Tape Rash    ADHESIVE    Patient Measurements: Height: 5\' 9"  (175.3 cm) Weight: 124 lb 3.2 oz (56.337 kg) IBW/kg (Calculated) : 66.2 Heparin Dosing Weight: 56.3 kg  Vital Signs: Temp: 98 F (36.7 C) (05/06 0420) Temp Source: Oral (05/06 0420) BP: 134/57 mmHg (05/06 0420) Pulse Rate: 90 (05/06 0420)  Labs:  Recent Labs  09/15/15 1410 09/15/15 1440 09/15/15 1559 09/16/15 0328  HGB 9.1*  --   --   --   HCT 28.2*  --   --   --   PLT 192  --   --   --   LABPROT  --  15.0  --   --   INR  --  1.16  --   --   HEPARINUNFRC  --   --   --  0.31  CREATININE 2.26*  --   --   --   TROPONINI 0.04*  --  0.04*  --     Estimated Creatinine Clearance: 19.7 mL/min (by C-G formula based on Cr of 2.26).   Assessment: 32 yof on heparin for r/o ACS. Heparin level therapeutic on 700 units/hr.  Goal of Therapy:  Heparin level 0.3-0.7 units/ml Monitor platelets by anticoagulation protocol: Yes   Plan:  Continue heparin at 700 units/hr. Will f/u 6 hr confirmatory heparin level  Sherlon Handing, PharmD, BCPS Clinical pharmacist, pager [phone] 09/16/2015,4:34 AM

## 2015-09-16 NOTE — Progress Notes (Signed)
Pharmacy Antibiotic Note  Tiffany [name] is a 75 y.o. female admitted on 09/15/2015 with UTI.  Pharmacy has been consulted for Rocephin dosing.  Plan: Rocephin 1gm IV q24h Pharmacy will sign off abx dosing - please reconsult if needed  Height: 5\' 9"  (175.3 cm) Weight: 124 lb 1.6 oz (56.291 kg) IBW/kg (Calculated) : 66.2  Temp (24hrs), Avg:98.8 F (37.1 C), Min:97.8 F (36.6 C), Max:100.3 F (37.9 C)   Recent Labs Lab 09/15/15 1410 09/15/15 1447  WBC 7.4  --   CREATININE 2.26*  --   LATICACIDVEN  --  0.96    Estimated Creatinine Clearance: 19.7 mL/min (by C-G formula based on Cr of 2.26).    Allergies  Allergen Reactions  . Florinef [Fludrocortisone Acetate]     Malaise: Discomfort   . Iohexol Other (See Comments)    Pt. States that Dr. Florene Glen her kidney Dr. Does not want her to receive iv dye due to increased risk of kidney failure.   . Sulfonamide Derivatives Hives  . Tape Rash    ADHESIVE    Thank you for allowing pharmacy to be a part of this patient's care.  Sherlon Handing, PharmD, BCPS Clinical pharmacist, pager [phone] 09/16/2015 3:42 AM

## 2015-09-16 NOTE — Progress Notes (Signed)
  Echocardiogram 2D Echocardiogram has been performed.  Johny Chess 09/16/2015, 4:38 PM

## 2015-09-16 NOTE — Progress Notes (Signed)
[facility] PROGRESS NOTE  Tiffany [name] [id number] DOB: 05/14/1940 DOA: 09/15/2015 PCP: No PCP Per Patient  Assessment/Plan: 1. Non-ST elevation MI/unstable angina with history of CAD status post stenting in 2007 - at this time patient has been started on heparin infusion and an aspirin. Patient is already on statins. Since patient's blood pressure is in the low normals no beta blocker started. Cycled cardiac markers checked and trending down. check 2-D echo pending #2. Fever - likely source could be UTI. Patient is on ceftriaxone follow cultures from 5/5. #3. Orthostatic hypotension - patient has history of orthostatic hypotension. At this time a holding of patients Lasix and gently hydrating. #4. History of lupus and Sjogren's syndrome on hydroxychloroquine. #5. Chronic kidney disease stage 3-4 - closely follow metabolic panel. Creatinine improving. #6. Hyperlipidemia on statins.   DVT prophylaxis: Heparin infusion. Code Status: Full code.  Family Communication: No family at the bedside.  Disposition Plan: Home.  Consults called: Will need to reconsult cardiology name.   Consultants:  Cardiology  Procedures:  Echocardiogram  Antibiotics:  Rocephin day 2 (indicate start date, and stop date if known)  HPI/Subjective: Patient denies any acute events overnight. Patient currently has no questions. Patient denies any chest pain since, to the floor. Patient denies any shortness of breath.  Objective: Filed Vitals:   09/16/15 0810 09/16/15 1200  BP: 107/57 109/65  Pulse: 118 88  Temp: 98.7 F (37.1 C) 98.6 F (37 C)  Resp: 25 13    Intake/Output Summary (Last 24 hours) at 09/16/15 1756 Last data filed at 09/16/15 1711  Gross per 24 hour  Intake 1013.07 ml  Output   1100 ml  Net -86.93 ml   Filed Weights   09/15/15 2100 09/16/15 0300 09/16/15 0420  Weight: 56.291 kg (124 lb 1.6 oz) 56.337 kg (124 lb 3.2 oz) 56.337 kg (124 lb 3.2 oz)    Exam:  General: No  acute distress ENMT: No discharge from the ears eyes nose and mouth. Neck: No mass felt. No neck rigidity. Respiratory: No rhonchi or crepitations. Cardiovascular: S1 and S2 heard. Abdomen: Soft nontender bowel sounds present. Musculoskeletal: No edema. Skin: No rash. Neurologic: Alert awake oriented to time place and person. Moves all extremities. Psychiatric: Appears normal.   Data Reviewed: Basic Metabolic Panel:  Recent Labs Lab 09/15/15 1410 09/16/15 0320  NA 137 137  K 4.1 3.7  CL 111 111  CO2 19* 14*  GLUCOSE 86 75  BUN 25* 22*  CREATININE 2.26* 1.94*  CALCIUM 8.8* 8.0*   Liver Function Tests:  Recent Labs Lab 09/15/15 1410 09/16/15 0320  AST 18 15  ALT 16 13*  ALKPHOS 52 41  BILITOT 0.2* 0.3  PROT 7.6 6.1*  ALBUMIN 2.9* 2.1*   No results for input(s): LIPASE, AMYLASE in the last 168 hours. No results for input(s): AMMONIA in the last 168 hours. CBC:  Recent Labs Lab 09/15/15 1410 09/16/15 0320  WBC 7.4 7.6  NEUTROABS 5.6  --   HGB 9.1* 7.5*  HCT 28.2* 24.5*  MCV 92.5 93.2  PLT 192 166   Cardiac Enzymes:  Recent Labs Lab 09/15/15 1410 09/15/15 1559 09/16/15 0320 09/16/15 1115 09/16/15 1428  TROPONINI 0.04* 0.04* 0.04* 0.06* 0.04*   BNP (last 3 results)  Recent Labs  09/16/15 1115  BNP 187.6*    ProBNP (last 3 results) No results for input(s): PROBNP in the last 8760 hours.  CBG: No results for input(s): GLUCAP in the last 168 hours.  Recent Results (  from the past 240 hour(s))  Urine culture     Status: Abnormal   Collection Time: 09/08/15  4:50 PM  Result Value Ref Range Status   Specimen Description URINE, CLEAN CATCH  Final   Special Requests NONE  Final   Culture (A)  Final    3,000 COLONIES/mL INSIGNIFICANT GROWTH Performed at [facility]    Report Status 09/10/2015 FINAL  Final  Blood culture (routine x 2)     Status: None (Preliminary result)   Collection Time: 09/15/15  2:40 PM  Result Value Ref Range  Status   Specimen Description LEFT ANTECUBITAL  Final   Special Requests BOTTLES DRAWN AEROBIC AND ANAEROBIC 6CC  Final   Culture NO GROWTH < 24 HOURS  Final   Report Status PENDING  Incomplete  Blood culture (routine x 2)     Status: None (Preliminary result)   Collection Time: 09/15/15  2:46 PM  Result Value Ref Range Status   Specimen Description RIGHT ANTECUBITAL  Final   Special Requests BOTTLES DRAWN AEROBIC AND ANAEROBIC 6CC  Final   Culture NO GROWTH < 24 HOURS  Final   Report Status PENDING  Incomplete  Urine culture     Status: None (Preliminary result)   Collection Time: 09/15/15  5:20 PM  Result Value Ref Range Status   Specimen Description URINE, CATHETERIZED  Final   Special Requests NONE  Final   Culture   Final    TOO YOUNG TO READ Performed at [facility]    Report Status PENDING  Incomplete  MRSA PCR Screening     Status: Abnormal   Collection Time: 09/15/15  9:20 PM  Result Value Ref Range Status   MRSA by PCR POSITIVE (A) NEGATIVE Final    Comment:        The GeneXpert MRSA Assay (FDA approved for NASAL specimens only), is one component of a comprehensive MRSA colonization surveillance program. It is not intended to diagnose MRSA infection nor to guide or monitor treatment for MRSA infections. RESULT CALLED TO, READ BACK BY AND VERIFIED WITH: W HICKS,RN AT [id number] 09/16/15 BY L BENFIELD      Studies: Dg Chest 2 View  09/15/2015  CLINICAL DATA:  Chest pain, weakness for 2 weeks, low blood pressure EXAM: CHEST  2 VIEW COMPARISON:  11/15/2014 FINDINGS: Cardiomediastinal silhouette is stable. Mild hyperinflation. No infiltrate or pleural effusion. No pulmonary edema. Osteopenia and mild degenerative changes thoracic spine. IMPRESSION: No active cardiopulmonary disease. Electronically Signed   By: Lahoma Crocker M.D.   On: 09/15/2015 16:01   Echocardiogram Impressions:  - Moderate to severe global reduction in LV function; grade 1  diastolic dysfunction;  mild LVE; mild LAE; moderate MR.  Scheduled Meds: . ALPRAZolam  0.5 mg Oral TID  . amitriptyline  100 mg Oral QHS  . aspirin EC  325 mg Oral Daily  . atorvastatin  40 mg Oral q morning - 10a  . cefTRIAXone (ROCEPHIN)  IV  1 g Intravenous Q24H  . Chlorhexidine Gluconate Cloth  6 each Topical Q0600  . cholecalciferol  400 Units Oral QPM  . folic acid  1 mg Oral q morning - 10a  . HYDROcodone-acetaminophen  1 tablet Oral TID  . hydroxychloroquine  400 mg Oral Daily  . iron polysaccharides  150 mg Oral BID  . latanoprost  1 drop Both Eyes QHS  . levothyroxine  25 mcg Oral QAC breakfast  . mupirocin ointment  1 application Nasal BID  . pantoprazole  40 mg Oral Daily  . sodium bicarbonate  650 mg Oral TID   Continuous Infusions: . sodium chloride 75 mL/hr at 09/15/15 2107    Principal Problem:   NSTEMI (non-ST elevated myocardial infarction) [facility]) Active Problems:   Orthostatic hypotension   Sjogren's syndrome (HCC)   Hypertension   Chronic kidney disease   Chest pain   Systemic lupus erythematosus ([place])   Fever   Non-ST elevated myocardial infarction [facility])    Time spent: [facility] Pager [phone]. If 7PM-7AM, please contact night-coverage at [email or web address], password [facility] 09/16/2015, 5:56 PM  LOS: 1 day

## 2015-09-16 NOTE — Progress Notes (Signed)
Patient pressed call bell and stated she fell while attempting to use bedside commode. Nurse tech found patient on floor in a sitting position at bedside commode. Patient stated she was attempting to get up to bedside commode when the commode slipped away from her. Patient with small skin tear to left lower posterior forearm. Patient complains of mild pain to arm in area of skin tear, no deformity noted. Vital signs WDL. Dressing placed on arm. MD notified of fall, and no further orders at this time.

## 2015-09-16 NOTE — Consult Note (Signed)
Primary cardiologist: Dr Domenic Polite  HPI: 75 year old female with past medical history of lupus, lymphoma, coronary artery disease, Sjogren's, orthostatic hypotension, chronic kidney disease and COPD for evaluation of chest pain. Patient has had previous PCI of her right coronary artery in 2006. Follow-up catheterization in 2007 showed moderate nonobstructive coronary disease and patent stent in the right coronary. Nuclear study 2013 showed ejection fraction 49%. There was breast attenuation and possible inferior scar but no ischemia. Echocardiogram September 2015 showed ejection fraction 40-45%, Moderate left atrial enlargement and mild mitral regurgitation. Patient has had URI symptoms for several days. She complains of a cough chills but no hemoptysis. She has noticed mild increased dyspnea.There is no orthopnea, PND or pedal edema.Yesterday at 9 AM she developed left-sided chest pressure. No radiation. There was shortness of breath but no nausea or diaphoresis. The pain was not pleuritic, positional or related to food. Lasted 8 hours and resolved. Patient came to the emergency room at [facility] and was transferred to [facility] for further management. Presently pain-free.  Medications Prior to Admission  Medication Sig Dispense Refill  . albuterol (PROVENTIL HFA;VENTOLIN HFA) 108 (90 BASE) MCG/ACT inhaler Inhale 2 puffs into the lungs every 6 (six) hours as needed for wheezing or shortness of breath. 1 Inhaler 2  . ALPRAZolam (XANAX) 0.5 MG tablet Take 0.5 mg by mouth 3 (three) times daily.     Marland Kitchen amitriptyline (ELAVIL) 100 MG tablet Take 100 mg by mouth daily.  0  . Artificial Tear Solution (SOOTHE XP OP) Apply 1 drop to eye 3 (three) times daily.     Marland Kitchen aspirin EC 81 MG tablet Take 81 mg by mouth at bedtime.     Marland Kitchen atorvastatin (LIPITOR) 40 MG tablet Take 1 tablet (40 mg total) by mouth at bedtime. (Patient taking differently: Take 40 mg by mouth every morning. ) 30 tablet 6  . cephALEXin  (KEFLEX) 250 MG capsule Take 1 capsule (250 mg total) by mouth 4 (four) times daily. 28 capsule 0  . Cholecalciferol (VITAMIN D) 400 UNITS capsule Take 400 Units by mouth every evening.     . clobetasol cream (TEMOVATE) 9.16 % Apply 1 application topically 2 (two) times daily.    . Cyanocobalamin (B-12 PO) Take 1 tablet by mouth daily.    . folic acid (FOLVITE) 1 MG tablet Take 1 mg by mouth every morning.     . furosemide (LASIX) 20 MG tablet TAKE 1 TABLET (20 MG TOTAL) BY MOUTH EVERY OTHER DAY AS NEEDED FOR EDEMA 45 tablet 1  . HYDROcodone-acetaminophen (NORCO/VICODIN) 5-325 MG per tablet Take 1 tablet by mouth 3 (three) times daily.     . hydroxychloroquine (PLAQUENIL) 200 MG tablet Take 400 mg by mouth daily. To be taken with food or milk    . iron polysaccharides (NIFEREX) 150 MG capsule Take 150 mg by mouth 2 (two) times daily.     Marland Kitchen L-LYSINE PO Take 1 tablet by mouth 2 (two) times daily.     Marland Kitchen levothyroxine (SYNTHROID, LEVOTHROID) 25 MCG tablet Take 25 mcg by mouth every morning.     . medroxyPROGESTERone (PROVERA) 10 MG tablet Take 1 tablet (10 mg total) by mouth daily. 30 tablet 11  . nitroGLYCERIN (NITROSTAT) 0.4 MG SL tablet Place 1 tablet (0.4 mg total) under the tongue every 5 (five) minutes as needed. 100 tablet 6  . omeprazole (PRILOSEC) 20 MG capsule Take 20 mg by mouth 2 (two) times daily.     Marland Kitchen Phenylephrine-DM-GG-APAP (  MUCINEX FAST-MAX) 10-20-400-650 MG PACK Take 10-15 mLs by mouth every 6 (six) hours as needed (nasal congestion).    . raloxifene (EVISTA) 60 MG tablet Take 60 mg by mouth every morning.     . sodium bicarbonate 325 MG tablet Take 650 mg by mouth 3 (three) times daily.     . Travoprost, BAK Free, (TRAVATAN) 0.004 % SOLN ophthalmic solution Place 1 drop into both eyes at bedtime.      Allergies  Allergen Reactions  . Florinef [Fludrocortisone Acetate]     Malaise: Discomfort   . Iohexol Other (See Comments)    Pt. States that Dr. Florene Glen her kidney Dr. Does  not want her to receive iv dye due to increased risk of kidney failure.   . Sulfonamide Derivatives Hives  . Tape Rash    ADHESIVE    Past Medical History  Diagnosis Date  . Lymphoma ([place])   . Systemic lupus erythematosus ([place])   . Depression   . Hyperlipidemia   . CAD (coronary artery disease)     DES to mid RCA 2006  . Sjogren's syndrome ([place])   . Orthostatic hypotension   . Dysphagia   . Syncope   . Anemia   . CKD (chronic kidney disease) stage 3, GFR 30-59 ml/min   . COPD (chronic obstructive pulmonary disease) ([place])   . PMB (postmenopausal bleeding) 06/12/2015    Past Surgical History  Procedure Laterality Date  . Breast excisional biopsy Left   . Eye surgery    . Mass excision      Lymphoma  . Coronary angioplasty with stent placement    . Cholecystectomy    . Cataract extraction Bilateral   . Neck surgery      Social History   Social History  . Marital Status: Divorced    Spouse Name: N/A  . Number of Children: N/A  . Years of Education: N/A   Occupational History  . Disabled and retired    Social History Main Topics  . Smoking status: Current Every Day Smoker -- 0.25 packs/day for 23 years    Types: Cigarettes    Start date: 07/14/1986  . Smokeless tobacco: Never Used  . Alcohol Use: No  . Drug Use: No  . Sexual Activity: No   Other Topics Concern  . Not on file   Social History Narrative    Family History  Problem Relation Age of Onset  . Coronary artery disease Father   . Coronary artery disease Mother   . Liver disease Mother   . Esophageal cancer Brother   . Breast cancer Sister     ROS:  Positive cough and chills but no hemoptysis, dysphasia, odynophagia, melena, hematochezia, dysuria, hematuria, rash, seizure activity, orthopnea, PND, pedal edema, claudication. Remaining systems are negative.  Physical Exam:   Blood pressure 107/57, pulse 118, temperature 98.7 F (37.1 C), temperature source Oral, resp. rate 25, height _0   (1.753 m), weight 124 lb 3.2 oz (56.337 kg), SpO2 95 %.  General:  Well developed/frail in NAD Skin warm/dry Patient not depressed No peripheral clubbing Back-normal HEENT-normal/normal eyelids Neck supple/normal carotid upstroke bilaterally; no bruits; no JVD; no thyromegaly chest - CTA/ normal expansion CV - RRR/normal S1 and S2; no rubs or gallops;  PMI nondisplaced; 2/6 systolic murmur LSB Abdomen -NT/ND, no HSM, no mass, + bowel sounds, no bruit 2+ femoral pulses, no bruits Ext-no edema, chords, 2+ DP Neuro-grossly nonfocal  ECG 09/15/2015-sinus rhythm, cannot rule out prior inferior infarct. Telemetry-sinus  with brief PAT  Results for orders placed or performed during the hospital encounter of 09/15/15 (from the past 48 hour(s))  CBC with Differential     Status: Abnormal   Collection Time: 09/15/15  2:10 PM  Result Value Ref Range   WBC 7.4 4.0 - 10.5 K/uL   RBC 3.05 (L) 3.87 - 5.11 MIL/uL   Hemoglobin 9.1 (L) 12.0 - 15.0 g/dL   HCT 28.2 (L) 36.0 - 46.0 %   MCV 92.5 78.0 - 100.0 fL   MCH 29.8 26.0 - 34.0 pg   MCHC 32.3 30.0 - 36.0 g/dL   RDW 15.4 11.5 - 15.5 %   Platelets 192 150 - 400 K/uL   Neutrophils Relative % 76 %   Neutro Abs 5.6 1.7 - 7.7 K/uL   Lymphocytes Relative 13 %   Lymphs Abs 1.0 0.7 - 4.0 K/uL   Monocytes Relative 10 %   Monocytes Absolute 0.8 0.1 - 1.0 K/uL   Eosinophils Relative 1 %   Eosinophils Absolute 0.1 0.0 - 0.7 K/uL   Basophils Relative 0 %   Basophils Absolute 0.0 0.0 - 0.1 K/uL  Comprehensive metabolic panel     Status: Abnormal   Collection Time: 09/15/15  2:10 PM  Result Value Ref Range   Sodium 137 135 - 145 mmol/L   Potassium 4.1 3.5 - 5.1 mmol/L   Chloride 111 101 - 111 mmol/L   CO2 19 (L) 22 - 32 mmol/L   Glucose, Bld 86 65 - 99 mg/dL   BUN 25 (H) 6 - 20 mg/dL   Creatinine, Ser 2.26 (H) 0.44 - 1.00 mg/dL   Calcium 8.8 (L) 8.9 - 10.3 mg/dL   Total Protein 7.6 6.5 - 8.1 g/dL   Albumin 2.9 (L) 3.5 - 5.0 g/dL   AST 18 15 -  41 U/L   ALT 16 14 - 54 U/L   Alkaline Phosphatase 52 38 - 126 U/L   Total Bilirubin 0.2 (L) 0.3 - 1.2 mg/dL   GFR calc non Af Amer 20 (L) >60 mL/min   GFR calc Af Amer 24 (L) >60 mL/min    Comment: (NOTE) The eGFR has been calculated using the CKD EPI equation. This calculation has not been validated in all clinical situations. eGFR's persistently <60 mL/min signify possible Chronic Kidney Disease.    Anion gap 7 5 - 15  Troponin I     Status: Abnormal   Collection Time: 09/15/15  2:10 PM  Result Value Ref Range   Troponin I 0.04 (H) <0.031 ng/mL    Comment:        PERSISTENTLY INCREASED TROPONIN VALUES IN THE RANGE OF 0.04-0.49 ng/mL CAN BE SEEN IN:       -UNSTABLE ANGINA       -CONGESTIVE HEART FAILURE       -MYOCARDITIS       -CHEST TRAUMA       -ARRYHTHMIAS       -LATE PRESENTING MYOCARDIAL INFARCTION       -COPD   CLINICAL FOLLOW-UP RECOMMENDED.   Protime-INR     Status: None   Collection Time: 09/15/15  2:40 PM  Result Value Ref Range   Prothrombin Time 15.0 11.6 - 15.2 seconds   INR 1.16 0.00 - 1.49  Blood culture (routine x 2)     Status: None (Preliminary result)   Collection Time: 09/15/15  2:40 PM  Result Value Ref Range   Specimen Description LEFT ANTECUBITAL    Special Requests BOTTLES DRAWN  AEROBIC AND ANAEROBIC 6CC    Culture NO GROWTH < 24 HOURS    Report Status PENDING   I-stat troponin, ED     Status: None   Collection Time: 09/15/15  2:45 PM  Result Value Ref Range   Troponin i, poc 0.04 0.00 - 0.08 ng/mL   Comment 3            Comment: Due to the release kinetics of cTnI, a negative result within the first hours of the onset of symptoms does not rule out myocardial infarction with certainty. If myocardial infarction is still suspected, repeat the test at appropriate intervals.   Blood culture (routine x 2)     Status: None (Preliminary result)   Collection Time: 09/15/15  2:46 PM  Result Value Ref Range   Specimen Description RIGHT  ANTECUBITAL    Special Requests BOTTLES DRAWN AEROBIC AND ANAEROBIC 6CC    Culture NO GROWTH < 24 HOURS    Report Status PENDING   I-Stat CG4 Lactic Acid, ED     Status: None   Collection Time: 09/15/15  2:47 PM  Result Value Ref Range   Lactic Acid, Venous 0.96 0.5 - 2.0 mmol/L  Urinalysis, Routine w reflex microscopic (not at [facility])     Status: Abnormal   Collection Time: 09/15/15  3:16 PM  Result Value Ref Range   Color, Urine YELLOW YELLOW   APPearance HAZY (A) CLEAR   Specific Gravity, Urine 1.025 1.005 - 1.030   pH 6.0 5.0 - 8.0   Glucose, UA NEGATIVE NEGATIVE mg/dL   Hgb urine dipstick LARGE (A) NEGATIVE   Bilirubin Urine NEGATIVE NEGATIVE   Ketones, ur NEGATIVE NEGATIVE mg/dL   Protein, ur 100 (A) NEGATIVE mg/dL   Nitrite NEGATIVE NEGATIVE   Leukocytes, UA TRACE (A) NEGATIVE  Urine microscopic-add on     Status: Abnormal   Collection Time: 09/15/15  3:16 PM  Result Value Ref Range   Squamous Epithelial / LPF 0-5 (A) NONE SEEN   WBC, UA 0-5 0 - 5 WBC/hpf   RBC / HPF 6-30 0 - 5 RBC/hpf   Bacteria, UA RARE (A) NONE SEEN  Troponin I     Status: Abnormal   Collection Time: 09/15/15  3:59 PM  Result Value Ref Range   Troponin I 0.04 (H) <0.031 ng/mL    Comment:        PERSISTENTLY INCREASED TROPONIN VALUES IN THE RANGE OF 0.04-0.49 ng/mL CAN BE SEEN IN:       -UNSTABLE ANGINA       -CONGESTIVE HEART FAILURE       -MYOCARDITIS       -CHEST TRAUMA       -ARRYHTHMIAS       -LATE PRESENTING MYOCARDIAL INFARCTION       -COPD   CLINICAL FOLLOW-UP RECOMMENDED.   MRSA PCR Screening     Status: Abnormal   Collection Time: 09/15/15  9:20 PM  Result Value Ref Range   MRSA by PCR POSITIVE (A) NEGATIVE    Comment:        The GeneXpert MRSA Assay (FDA approved for NASAL specimens only), is one component of a comprehensive MRSA colonization surveillance program. It is not intended to diagnose MRSA infection nor to guide or monitor treatment for MRSA infections. RESULT  CALLED TO, READ BACK BY AND VERIFIED WITH: W HICKS,RN AT 1191 09/16/15 BY L BENFIELD   D-dimer, quantitative (not at [facility])     Status: Abnormal   Collection Time:  09/15/15 11:57 PM  Result Value Ref Range   D-Dimer, Quant 1.36 (H) 0.00 - 0.50 ug/mL-FEU    Comment: (NOTE) At the manufacturer cut-off of 0.50 ug/mL FEU, this assay has been documented to exclude PE with a sensitivity and negative predictive value of 97 to 99%.  At this time, this assay has not been approved by the FDA to exclude DVT/VTE. Results should be correlated with clinical presentation.   CBC WITH DIFFERENTIAL     Status: Abnormal   Collection Time: 09/16/15  3:20 AM  Result Value Ref Range   WBC 7.6 4.0 - 10.5 K/uL   RBC 2.63 (L) 3.87 - 5.11 MIL/uL   Hemoglobin 7.5 (L) 12.0 - 15.0 g/dL   HCT 24.5 (L) 36.0 - 46.0 %   MCV 93.2 78.0 - 100.0 fL   MCH 28.5 26.0 - 34.0 pg   MCHC 30.6 30.0 - 36.0 g/dL   RDW 15.4 11.5 - 15.5 %   Platelets 166 150 - 400 K/uL   Neutrophils Relative % 68 %   Band Neutrophils 4 %   Lymphocytes Relative 20 %   Monocytes Relative 8 %  Comprehensive metabolic panel     Status: Abnormal   Collection Time: 09/16/15  3:20 AM  Result Value Ref Range   Sodium 137 135 - 145 mmol/L   Potassium 3.7 3.5 - 5.1 mmol/L   Chloride 111 101 - 111 mmol/L   CO2 14 (L) 22 - 32 mmol/L   Glucose, Bld 75 65 - 99 mg/dL   BUN 22 (H) 6 - 20 mg/dL   Creatinine, Ser 1.94 (H) 0.44 - 1.00 mg/dL   Calcium 8.0 (L) 8.9 - 10.3 mg/dL   Total Protein 6.1 (L) 6.5 - 8.1 g/dL   Albumin 2.1 (L) 3.5 - 5.0 g/dL   AST 15 15 - 41 U/L   ALT 13 (L) 14 - 54 U/L   Alkaline Phosphatase 41 38 - 126 U/L   Total Bilirubin 0.3 0.3 - 1.2 mg/dL   GFR calc non Af Amer 24 (L) >60 mL/min   GFR calc Af Amer 28 (L) >60 mL/min    Comment: (NOTE) The eGFR has been calculated using the CKD EPI equation. This calculation has not been validated in all clinical situations. eGFR's persistently <60 mL/min signify possible Chronic  Kidney Disease.    Anion gap 12 5 - 15  Troponin I (q 6hr x 3)     Status: Abnormal   Collection Time: 09/16/15  3:20 AM  Result Value Ref Range   Troponin I 0.04 (H) <0.031 ng/mL    Comment:        PERSISTENTLY INCREASED TROPONIN VALUES IN THE RANGE OF 0.04-0.49 ng/mL CAN BE SEEN IN:       -UNSTABLE ANGINA       -CONGESTIVE HEART FAILURE       -MYOCARDITIS       -CHEST TRAUMA       -ARRYHTHMIAS       -LATE PRESENTING MYOCARDIAL INFARCTION       -COPD   CLINICAL FOLLOW-UP RECOMMENDED.   Heparin level (unfractionated)     Status: None   Collection Time: 09/16/15  3:28 AM  Result Value Ref Range   Heparin Unfractionated 0.31 0.30 - 0.70 IU/mL    Comment:        IF HEPARIN RESULTS ARE BELOW EXPECTED VALUES, AND PATIENT DOSAGE HAS BEEN CONFIRMED, SUGGEST FOLLOW UP TESTING OF ANTITHROMBIN III LEVELS.     Dg Chest  2 View  09/15/2015  CLINICAL DATA:  Chest pain, weakness for 2 weeks, low blood pressure EXAM: CHEST  2 VIEW COMPARISON:  11/15/2014 FINDINGS: Cardiomediastinal silhouette is stable. Mild hyperinflation. No infiltrate or pleural effusion. No pulmonary edema. Osteopenia and mild degenerative changes thoracic spine. IMPRESSION: No active cardiopulmonary disease. Electronically Signed   By: Lahoma Crocker M.D.   On: 09/15/2015 16:01    Assessment/Plan 1 chest pain-symptoms are atypical. She states they are unlike her pain prior to previous PCI. Her troponins are not diagnostic of a non-ST elevation myocardial infarction as the trend is flat and they are only minimally elevated. Pain may be related to ongoing pulmonary process. Would not plan further ischemia evaluation in house. She needs therapy for upper respiratory infection and anemia We will plan an outpatient nuclear study once she improves. Echo pending. Previous EF 40-45. DC heparin. 2 Coronary artery disease-continue aspirin and statin. 3 upper respiratory infection-continue antibiotics. 4 orthostatic hypotension-agree  with gentle hydration.This apparently is a chronic issue. 5 chronic kidney disease 6 acute on chronic normocytic anemia possibly secondary to renal insufficiency. Further evaluation per primary care.  Kirk Ruths MD 09/16/2015, 11:57 AM

## 2015-09-17 ENCOUNTER — Observation Stay (HOSPITAL_COMMUNITY): Payer: Medicare Other

## 2015-09-17 DIAGNOSIS — N184 Chronic kidney disease, stage 4 (severe): Secondary | ICD-10-CM | POA: Diagnosis not present

## 2015-09-17 DIAGNOSIS — R072 Precordial pain: Secondary | ICD-10-CM | POA: Diagnosis not present

## 2015-09-17 DIAGNOSIS — I10 Essential (primary) hypertension: Secondary | ICD-10-CM

## 2015-09-17 DIAGNOSIS — D6489 Other specified anemias: Secondary | ICD-10-CM | POA: Diagnosis not present

## 2015-09-17 DIAGNOSIS — I214 Non-ST elevation (NSTEMI) myocardial infarction: Secondary | ICD-10-CM | POA: Diagnosis not present

## 2015-09-17 DIAGNOSIS — R0602 Shortness of breath: Secondary | ICD-10-CM | POA: Diagnosis not present

## 2015-09-17 LAB — CBC
HEMATOCRIT: 22.7 % — AB (ref 36.0–46.0)
Hemoglobin: 7 g/dL — ABNORMAL LOW (ref 12.0–15.0)
MCH: 28.7 pg (ref 26.0–34.0)
MCHC: 30.8 g/dL (ref 30.0–36.0)
MCV: 93 fL (ref 78.0–100.0)
PLATELETS: 160 10*3/uL (ref 150–400)
RBC: 2.44 MIL/uL — AB (ref 3.87–5.11)
RDW: 15.6 % — AB (ref 11.5–15.5)
WBC: 5.4 10*3/uL (ref 4.0–10.5)

## 2015-09-17 LAB — RETICULOCYTES
RBC.: 2.53 MIL/uL — AB (ref 3.87–5.11)
RETIC CT PCT: 0.9 % (ref 0.4–3.1)
Retic Count, Absolute: 22.8 10*3/uL (ref 19.0–186.0)

## 2015-09-17 LAB — PREPARE RBC (CROSSMATCH)

## 2015-09-17 LAB — URINE CULTURE

## 2015-09-17 LAB — IRON AND TIBC
Iron: 13 ug/dL — ABNORMAL LOW (ref 28–170)
SATURATION RATIOS: 9 % — AB (ref 10.4–31.8)
TIBC: 151 ug/dL — ABNORMAL LOW (ref 250–450)
UIBC: 138 ug/dL

## 2015-09-17 LAB — SAVE SMEAR

## 2015-09-17 LAB — BASIC METABOLIC PANEL
ANION GAP: 7 (ref 5–15)
BUN: 16 mg/dL (ref 6–20)
CALCIUM: 7.9 mg/dL — AB (ref 8.9–10.3)
CHLORIDE: 117 mmol/L — AB (ref 101–111)
CO2: 15 mmol/L — ABNORMAL LOW (ref 22–32)
CREATININE: 1.79 mg/dL — AB (ref 0.44–1.00)
GFR calc non Af Amer: 27 mL/min — ABNORMAL LOW (ref >60)
GFR, EST AFRICAN AMERICAN: 31 mL/min — AB (ref >60)
Glucose, Bld: 110 mg/dL — ABNORMAL HIGH (ref 65–99)
Potassium: 3.7 mmol/L (ref 3.5–5.1)
SODIUM: 139 mmol/L (ref 135–145)

## 2015-09-17 LAB — FOLATE: Folate: >80 ng/mL (ref >5.9)

## 2015-09-17 LAB — HEPARIN LEVEL (UNFRACTIONATED): Heparin Unfractionated: <0.1 IU/mL — ABNORMAL LOW (ref 0.30–0.70)

## 2015-09-17 LAB — FERRITIN: Ferritin: 145 ng/mL (ref 11–307)

## 2015-09-17 LAB — VITAMIN B12: VITAMIN B 12: 1291 pg/mL — AB (ref 180–914)

## 2015-09-17 MED ORDER — CARVEDILOL 3.125 MG PO TABS
3.1250 mg | ORAL_TABLET | Freq: Two times a day (BID) | ORAL | Status: DC
  Administered 2015-09-17 – 2015-09-18 (×2): 3.125 mg via ORAL
  Filled 2015-09-17 (×2): qty 1

## 2015-09-17 MED ORDER — FUROSEMIDE 10 MG/ML IJ SOLN
20.0000 mg | Freq: Once | INTRAMUSCULAR | Status: AC
  Administered 2015-09-17: 20 mg via INTRAVENOUS
  Filled 2015-09-17: qty 2

## 2015-09-17 MED ORDER — SODIUM CHLORIDE 0.9 % IV SOLN
Freq: Once | INTRAVENOUS | Status: AC
  Administered 2015-09-17: 14:00:00 via INTRAVENOUS

## 2015-09-17 NOTE — Progress Notes (Signed)
Pt had temp of 99.1 orally before blood transfusion. Pt temp after start of blood transfusion was 99.4 orally. MD notified. Will continue to monitor.    Ruben Reason, RN

## 2015-09-17 NOTE — Progress Notes (Signed)
    Subjective:  Denies CP or dyspnea   Objective:  Filed Vitals:   09/17/15 0000 09/17/15 0400 09/17/15 0600 09/17/15 0800  BP: 93/46 112/56  129/70  Pulse: 78 81  99  Temp:  98.7 F (37.1 C)    TempSrc:  Oral    Resp: 19 17  22   Height:      Weight:   130 lb (58.968 kg)   SpO2: 95% 95%  95%    Intake/Output from previous day:  Intake/Output Summary (Last 24 hours) at 09/17/15 0931 Last data filed at 09/17/15 0600  Gross per 24 hour  Intake 1276.07 ml  Output   1500 ml  Net -223.93 ml    Physical Exam: Physical exam: Well-developed well-nourished in no acute distress.  Skin is warm and dry.  HEENT is normal.  Neck is supple. No thyromegaly.  Chest is clear to auscultation with normal expansion.  Cardiovascular exam is regular rate and rhythm.  Abdominal exam nontender or distended. No masses palpated. Extremities show no edema. neuro grossly intact    Lab Results: Basic Metabolic Panel:  Recent Labs  09/15/15 1410 09/16/15 0320  NA 137 137  K 4.1 3.7  CL 111 111  CO2 19* 14*  GLUCOSE 86 75  BUN 25* 22*  CREATININE 2.26* 1.94*  CALCIUM 8.8* 8.0*   CBC:  Recent Labs  09/15/15 1410 09/16/15 0320 09/17/15 0523  WBC 7.4 7.6 5.4  NEUTROABS 5.6  --   --   HGB 9.1* 7.5* 7.0*  HCT 28.2* 24.5* 22.7*  MCV 92.5 93.2 93.0  PLT 192 166 160   Cardiac Enzymes:  Recent Labs  09/16/15 0320 09/16/15 1115 09/16/15 1428  TROPONINI 0.04* 0.06* 0.04*     Assessment/Plan:  1 chest pain-symptoms are atypical. She states they are unlike her pain prior to previous PCI. Her troponins are not diagnostic of a non-ST elevation myocardial infarction as the trend is flat and they are only minimally elevated. Pain may be related to ongoing pulmonary process. Would not plan further ischemia evaluation in house. Continue therapy for upper respiratory infection and anemia. FU echo shows EF 30 with moderate MR. Previous EF 40-45. Ischemic etiology needs to be excluded  but she has not had a recent infarct. We will plan an outpatient nuclear study once she improves. DC heparin.  2 Coronary artery disease-continue aspirin and statin. 3 upper respiratory infection-continue antibiotics. 4 orthostatic hypotension-This apparently is a chronic issue. Some improvement this AM. 5 chronic kidney disease 6 acute on chronic normocytic anemia possibly secondary to renal insufficiency. Further evaluation per primary care. 7 Cardiomyopathy-Plan nuclear study as outpt; would be at risk for contrast nephropathy if cath required in the future; will try low dose coreg; no ACEI for now given h/o orthostatic hypotension. 8 PAT-noted on telemetry; add low dose coreg to see if she tolerates.  Kirk Ruths 09/17/2015, 9:31 AM

## 2015-09-17 NOTE — Progress Notes (Addendum)
[facility] PROGRESS NOTE  Tiffany [name] [id number] DOB: [birth year] DOA: 09/15/2015 PCP: No PCP Per Patient  Assessment/Plan: 1. Non-ST elevation MI/unstable angina with history of CAD status post stenting in 2007 - at this time patient had heparin infusion stopped and is on aspirin. Patient is already on statins. Since patient's blood pressure is in the low normals no beta blocker started. Cycled cardiac markers checked and trending down. check 2-D echo which showed a decreased EF to about 30. Cardiology is currently advising outpatient stress test. Starting patient on Coreg and we'll see how she does. Likely discharge tomorrow. #2. Fever - likely source could be UTI or resp as pt has cough. Patient is on ceftriaxone (day 2) follow cultures from 5/5. Blood cultures 2 negative. This patient is showing clinical response if urine culture also comes back negative can start patient on Obert. #3. Orthostatic hypotension - patient has history of orthostatic hypotension. At this time a holding of patients Lasix and gently hydrating. #4. History of lupus and Sjogren's syndrome on hydroxychloroquine. #5. Chronic kidney disease stage 3-4 - closely follow metabolic panel. Creatinine improving again today #6. Hyperlipidemia on statins. #7. Anemia-discuss risks and benefits of transfusion the patient she agreed transfusion will give 2 units #8. Fall - denies head injury, bedside commode moved when pt went to sleep   DVT prophylaxis: scd Code Status: Full code.  Family Communication: No family at the bedside.  Disposition Plan: Home.  Consults called: Will need to reconsult cardiology name.   Consultants:  Cardiology  Procedures:  Echocardiogram  Antibiotics:  Rocephin day 2 (indicate start date, and stop date if known)  HPI/Subjective: Patient denies any acute events overnight.  Patient denies any chest pain since, to the floor. Patient denies any shortness of  breath.  Objective: Filed Vitals:   09/17/15 0400 09/17/15 0800  BP: 112/56 129/70  Pulse: 81 99  Temp: 98.7 F (37.1 C)   Resp: 17 22    Intake/Output Summary (Last 24 hours) at 09/17/15 1229 Last data filed at 09/17/15 0600  Gross per 24 hour  Intake 948.07 ml  Output   1500 ml  Net -551.93 ml   Filed Weights   09/16/15 0300 09/16/15 0420 09/17/15 0600  Weight: 56.337 kg (124 lb 3.2 oz) 56.337 kg (124 lb 3.2 oz) 58.968 kg (130 lb)    Exam:  General: No acute distress,No diaphoresis ENMT: No discharge from the ears eyes nose and mouth. Respiratory: No rhonchi or crepitations. Normal work of breathing. CTAB Cardiovascular: S1 and S2 heard. No murmurs rubs or gallops. Abdomen: Soft nontender bowel sounds present. Musculoskeletal: No edema. Skin: No rash. Abrasion on L forearm with CDI dressing Neurologic: Alert awake oriented to time place and person. Moves all extremities. Psychiatric: Appears normal.   Data Reviewed: Basic Metabolic Panel:  Recent Labs Lab 09/15/15 1410 09/16/15 0320 09/17/15 0843  NA 137 137 139  K 4.1 3.7 3.7  CL 111 111 117*  CO2 19* 14* 15*  GLUCOSE 86 75 110*  BUN 25* 22* 16  CREATININE 2.26* 1.94* 1.79*  CALCIUM 8.8* 8.0* 7.9*   Liver Function Tests:  Recent Labs Lab 09/15/15 1410 09/16/15 0320  AST 18 15  ALT 16 13*  ALKPHOS 52 41  BILITOT 0.2* 0.3  PROT 7.6 6.1*  ALBUMIN 2.9* 2.1*   No results for input(s): LIPASE, AMYLASE in the last 168 hours. No results for input(s): AMMONIA in the last 168 hours. CBC:  Recent Labs Lab 09/15/15 1410 09/16/15  0320 09/17/15 0523  WBC 7.4 7.6 5.4  NEUTROABS 5.6  --   --   HGB 9.1* 7.5* 7.0*  HCT 28.2* 24.5* 22.7*  MCV 92.5 93.2 93.0  PLT 192 166 160   Cardiac Enzymes:  Recent Labs Lab 09/15/15 1410 09/15/15 1559 09/16/15 0320 09/16/15 1115 09/16/15 1428  TROPONINI 0.04* 0.04* 0.04* 0.06* 0.04*   BNP (last 3 results)  Recent Labs  09/16/15 1115  BNP 187.6*     ProBNP (last 3 results) No results for input(s): PROBNP in the last 8760 hours.  CBG: No results for input(s): GLUCAP in the last 168 hours.  Recent Results (from the past 240 hour(s))  Urine culture     Status: Abnormal   Collection Time: 09/08/15  4:50 PM  Result Value Ref Range Status   Specimen Description URINE, CLEAN CATCH  Final   Special Requests NONE  Final   Culture (A)  Final    3,000 COLONIES/mL INSIGNIFICANT GROWTH Performed at [facility]    Report Status 09/10/2015 FINAL  Final  Blood culture (routine x 2)     Status: None (Preliminary result)   Collection Time: 09/15/15  2:40 PM  Result Value Ref Range Status   Specimen Description LEFT ANTECUBITAL  Final   Special Requests BOTTLES DRAWN AEROBIC AND ANAEROBIC 6CC  Final   Culture NO GROWTH 2 DAYS  Final   Report Status PENDING  Incomplete  Blood culture (routine x 2)     Status: None (Preliminary result)   Collection Time: 09/15/15  2:46 PM  Result Value Ref Range Status   Specimen Description RIGHT ANTECUBITAL  Final   Special Requests BOTTLES DRAWN AEROBIC AND ANAEROBIC 6CC  Final   Culture NO GROWTH 2 DAYS  Final   Report Status PENDING  Incomplete  Urine culture     Status: None (Preliminary result)   Collection Time: 09/15/15  5:20 PM  Result Value Ref Range Status   Specimen Description URINE, CATHETERIZED  Final   Special Requests NONE  Final   Culture   Final    TOO YOUNG TO READ Performed at [facility]    Report Status PENDING  Incomplete  MRSA PCR Screening     Status: Abnormal   Collection Time: 09/15/15  9:20 PM  Result Value Ref Range Status   MRSA by PCR POSITIVE (A) NEGATIVE Final    Comment:        The GeneXpert MRSA Assay (FDA approved for NASAL specimens only), is one component of a comprehensive MRSA colonization surveillance program. It is not intended to diagnose MRSA infection nor to guide or monitor treatment for MRSA infections. RESULT CALLED TO,  READ BACK BY AND VERIFIED WITH: W HICKS,RN AT [id number] 09/16/15 BY L BENFIELD      Studies: Dg Chest 2 View  09/15/2015  CLINICAL DATA:  Chest pain, weakness for 2 weeks, low blood pressure EXAM: CHEST  2 VIEW COMPARISON:  11/15/2014 FINDINGS: Cardiomediastinal silhouette is stable. Mild hyperinflation. No infiltrate or pleural effusion. No pulmonary edema. Osteopenia and mild degenerative changes thoracic spine. IMPRESSION: No active cardiopulmonary disease. Electronically Signed   By: Lahoma Crocker M.D.   On: 09/15/2015 16:01   Dg Chest Port 1 View  09/17/2015  CLINICAL DATA:  Productive cough with fever 2 days ago. No chest pain or shortness of breath. EXAM: PORTABLE CHEST 1 VIEW COMPARISON:  Sep 15, 2015 FINDINGS: Biapical pleural parenchymal thickening is stable, slightly greater on the left than the right,  likely scarring. No pneumothorax is identified on today's study. There is minimal opacity in the right base not seen yesterday, probably atelectasis. Minimal atelectasis is seen in the left base as well. A skin fold is seen over the left lateral chest. No convincing evidence of pneumothorax. The heart, hila, and mediastinum are unchanged and unremarkable. No overt edema. IMPRESSION: Mild bibasilar opacities, likely atelectasis. No other acute abnormalities. Electronically Signed   By: Dorise Bullion III M.D   On: 09/17/2015 09:27   Echocardiogram Impressions:  - Moderate to severe global reduction in LV function; grade 1  diastolic dysfunction; mild LVE; mild LAE; moderate MR.  Scheduled Meds: . sodium chloride   Intravenous Once  . ALPRAZolam  0.5 mg Oral TID  . amitriptyline  100 mg Oral QHS  . aspirin EC  325 mg Oral Daily  . atorvastatin  40 mg Oral q morning - 10a  . carvedilol  3.125 mg Oral BID WC  . cefTRIAXone (ROCEPHIN)  IV  1 g Intravenous Q24H  . Chlorhexidine Gluconate Cloth  6 each Topical Q0600  . cholecalciferol  400 Units Oral QPM  . folic acid  1 mg Oral q morning - 10a   . furosemide  20 mg Intravenous Once  . HYDROcodone-acetaminophen  1 tablet Oral TID  . hydroxychloroquine  400 mg Oral Daily  . iron polysaccharides  150 mg Oral BID  . latanoprost  1 drop Both Eyes QHS  . levothyroxine  25 mcg Oral QAC breakfast  . mupirocin ointment  1 application Nasal BID  . pantoprazole  40 mg Oral Daily  . sodium bicarbonate  650 mg Oral TID   Continuous Infusions:    Principal Problem:   NSTEMI (non-ST elevated myocardial infarction) ([place]) Active Problems:   Orthostatic hypotension   Sjogren's syndrome (HCC)   Hypertension   Chronic kidney disease   Chest pain   Systemic lupus erythematosus ([place])   Fever   Non-ST elevated myocardial infarction [facility])    Time spent: [facility] Pager [phone]. If 7PM-7AM, please contact night-coverage at [email or web address], password [facility] 09/17/2015, 12:29 PM  LOS: 2 days

## 2015-09-18 ENCOUNTER — Other Ambulatory Visit: Payer: Self-pay | Admitting: Physician Assistant

## 2015-09-18 DIAGNOSIS — I214 Non-ST elevation (NSTEMI) myocardial infarction: Principal | ICD-10-CM

## 2015-09-18 DIAGNOSIS — I429 Cardiomyopathy, unspecified: Secondary | ICD-10-CM

## 2015-09-18 DIAGNOSIS — D638 Anemia in other chronic diseases classified elsewhere: Secondary | ICD-10-CM

## 2015-09-18 DIAGNOSIS — N183 Chronic kidney disease, stage 3 (moderate): Secondary | ICD-10-CM | POA: Diagnosis not present

## 2015-09-18 LAB — HEMOGLOBIN AND HEMATOCRIT, BLOOD
HCT: 29.6 % — ABNORMAL LOW (ref 36.0–46.0)
Hemoglobin: 9.7 g/dL — ABNORMAL LOW (ref 12.0–15.0)

## 2015-09-18 MED ORDER — ACETAMINOPHEN 325 MG PO TABS: 650.0000 mg | ORAL_TABLET | Freq: Four times a day (QID) | ORAL | Status: DC | PRN

## 2015-09-18 MED ORDER — CARVEDILOL 3.125 MG PO TABS: 3.1250 mg | ORAL_TABLET | Freq: Two times a day (BID) | ORAL | Status: DC

## 2015-09-18 MED ORDER — ALBUTEROL SULFATE (2.5 MG/3ML) 0.083% IN NEBU: 2.5000 mg | INHALATION_SOLUTION | Freq: Four times a day (QID) | RESPIRATORY_TRACT | Status: AC | PRN

## 2015-09-18 NOTE — Discharge Instructions (Signed)

## 2015-09-18 NOTE — Progress Notes (Signed)
Patient Name: Tiffany [name] Date of Encounter: 09/18/2015     Principal Problem:   NSTEMI (non-ST elevated myocardial infarction) [facility]) Active Problems:   Orthostatic hypotension   Sjogren's syndrome (HCC)   Hypertension   Anemia   Chronic kidney disease   Chest pain   Systemic lupus erythematosus ([place])   Fever   Non-ST elevated myocardial infarction ([place])    SUBJECTIVE  No complaints. Wants to go home. No chest pain. Still with cough  CURRENT MEDS . ALPRAZolam  0.5 mg Oral TID  . amitriptyline  100 mg Oral QHS  . aspirin EC  325 mg Oral Daily  . atorvastatin  40 mg Oral q morning - 10a  . carvedilol  3.125 mg Oral BID WC  . cefTRIAXone (ROCEPHIN)  IV  1 g Intravenous Q24H  . Chlorhexidine Gluconate Cloth  6 each Topical Q0600  . cholecalciferol  400 Units Oral QPM  . folic acid  1 mg Oral q morning - 10a  . HYDROcodone-acetaminophen  1 tablet Oral TID  . hydroxychloroquine  400 mg Oral Daily  . iron polysaccharides  150 mg Oral BID  . latanoprost  1 drop Both Eyes QHS  . levothyroxine  25 mcg Oral QAC breakfast  . mupirocin ointment  1 application Nasal BID  . pantoprazole  40 mg Oral Daily  . sodium bicarbonate  650 mg Oral TID    OBJECTIVE  Filed Vitals:   09/17/15 1845 09/17/15 1922 09/17/15 2155 09/18/15 0500  BP: 102/58 111/70 127/57 122/67  Pulse:  61 79   Temp: 98.7 F (37.1 C) 99.4 F (37.4 C) 99.4 F (37.4 C) 98.9 F (37.2 C)  TempSrc: Oral Oral Oral Oral  Resp: 28 19 20 18   Height:      Weight:    128 lb 14.4 oz (58.469 kg)  SpO2: 98% 100% 99% 98%    Intake/Output Summary (Last 24 hours) at 09/18/15 0753 Last data filed at 09/18/15 0500  Gross per 24 hour  Intake   1560 ml  Output   2500 ml  Net   -940 ml   Filed Weights   09/16/15 0420 09/17/15 0600 09/18/15 0500  Weight: 124 lb 3.2 oz (56.337 kg) 130 lb (58.968 kg) 128 lb 14.4 oz (58.469 kg)    PHYSICAL EXAM  General: Pleasant, NAD. Chronically ill appearing Neuro: Alert and  oriented X 3. Moves all extremities spontaneously. Psych: Normal affect. HEENT:  Normal  Neck: Supple without bruits or JVD. Lungs:  Resp regular and unlabored, some mil rhonchi and wheezes Heart: RRR no s3, s4, or murmurs. Abdomen: Soft, non-tender, non-distended, BS + x 4.  Extremities: No clubbing, cyanosis or edema. DP/PT/Radials 2+ and equal bilaterally.  Accessory Clinical Findings  CBC  Recent Labs  09/15/15 1410 09/16/15 0320 09/17/15 0523 09/18/15 0040  WBC 7.4 7.6 5.4  --   NEUTROABS 5.6  --   --   --   HGB 9.1* 7.5* 7.0* 9.7*  HCT 28.2* 24.5* 22.7* 29.6*  MCV 92.5 93.2 93.0  --   PLT 192 166 160  --    Basic Metabolic Panel  Recent Labs  09/16/15 0320 09/17/15 0843  NA 137 139  K 3.7 3.7  CL 111 117*  CO2 14* 15*  GLUCOSE 75 110*  BUN 22* 16  CREATININE 1.94* 1.79*  CALCIUM 8.0* 7.9*   Liver Function Tests  Recent Labs  09/15/15 1410 09/16/15 0320  AST 18 15  ALT 16 13*  ALKPHOS  52 41  BILITOT 0.2* 0.3  PROT 7.6 6.1*  ALBUMIN 2.9* 2.1*   No results for input(s): LIPASE, AMYLASE in the last 72 hours. Cardiac Enzymes  Recent Labs  09/16/15 0320 09/16/15 1115 09/16/15 1428  TROPONINI 0.04* 0.06* 0.04*   BNP Invalid input(s): POCBNP D-Dimer  Recent Labs  09/15/15 2357  DDIMER 1.36*    TELE  NSR with some brief runs of PAT (vs afib??). MD to see.   Radiology/Studies  Dg Chest 2 View  09/15/2015  CLINICAL DATA:  Chest pain, weakness for 2 weeks, low blood pressure EXAM: CHEST  2 VIEW COMPARISON:  11/15/2014 FINDINGS: Cardiomediastinal silhouette is stable. Mild hyperinflation. No infiltrate or pleural effusion. No pulmonary edema. Osteopenia and mild degenerative changes thoracic spine. IMPRESSION: No active cardiopulmonary disease. Electronically Signed   By: Lahoma Crocker M.D.   On: 09/15/2015 16:01   Dg Chest Port 1 View  09/17/2015  CLINICAL DATA:  Productive cough with fever 2 days ago. No chest pain or shortness of breath.  EXAM: PORTABLE CHEST 1 VIEW COMPARISON:  Sep 15, 2015 FINDINGS: Biapical pleural parenchymal thickening is stable, slightly greater on the left than the right, likely scarring. No pneumothorax is identified on today's study. There is minimal opacity in the right base not seen yesterday, probably atelectasis. Minimal atelectasis is seen in the left base as well. A skin fold is seen over the left lateral chest. No convincing evidence of pneumothorax. The heart, hila, and mediastinum are unchanged and unremarkable. No overt edema. IMPRESSION: Mild bibasilar opacities, likely atelectasis. No other acute abnormalities. Electronically Signed   By: Dorise Bullion III M.D   On: 09/17/2015 09:27   Ct Renal Stone Study  09/08/2015  CLINICAL DATA:  Hematuria, burning urination starting this morning, flank pain EXAM: CT ABDOMEN AND PELVIS WITHOUT CONTRAST TECHNIQUE: Multidetector CT imaging of the abdomen and pelvis was performed following the standard protocol without IV contrast. COMPARISON:  Renal ultrasound 4/ 24/17 FINDINGS: Lower chest: Lung bases shows streaky atelectasis or scarring in left lower lobe posteriorly. Mild emphysematous changes. Small hiatal hernia. Hepatobiliary: Unenhanced liver shows no biliary ductal dilatation. The patient is status post cholecystectomy. No CBD dilatation. Pancreas: Unenhanced pancreas is mild atrophic. Spleen: Unenhanced spleen is unremarkable. Adrenals/Urinary Tract: Unenhanced adrenal glands are unremarkable. Multiple bilateral nonobstructive renal calculi are noted. The largest calculus in lower pole of the right kidney measures 5.3 mm. Largest calculus in lower pole of the left kidney measures 5.6 mm. Largest nonobstructive calculus in upper pole of the right kidney measures 4 mm. Mild dilatation of bilateral extrarenal pelvis. No hydronephrosis or hydroureter. No calcified ureteral calculi are noted bilaterally. At least 3 or 4 calcified calculi are noted within posterior  aspect of the urinary bladder the largest measures 3.3 mm. Bilateral mild lobulated renal contour. Mild bilateral cortical thinning probable due to atrophy. Again noted exophytic cyst in lower pole of the left kidney measures 8 mm. Stomach/Bowel: There is no gastric outlet obstruction. No small bowel obstruction. No thickened or dilated small bowel loops. There is a low lying cecum. No pericecal inflammation. Abundant stool noted within cecum and right colon. Abundant stool noted within redundant transverse colon. Moderate stool and gas noted within descending colon. The appendix is not identified. No distal colonic obstruction. Vascular/Lymphatic: Atherosclerotic calcifications of abdominal aorta and iliac arteries are noted. Reproductive: The uterus is atrophic. No adnexal masses noted. Small calcified adnexal vessels. Other: There is no ascites or free air. Musculoskeletal: No destructive bony lesions  are noted within pelvis. Sagittal images of the spine shows osteopenia and degenerative changes lumbar spine. Significant disc space flattening with vacuum disc phenomenon and endplate sclerotic changes at L1-L2 and L5-S1 level. IMPRESSION: 1. There is bilateral nonobstructive nephrolithiasis. No hydronephrosis or hydroureter. No calcified ureteral calculi. Bilateral mild prominent extrarenal pelvis. 2. Again noted exophytic cyst in lower pole of the left kidney measures 8 mm. 3. Small calcified calculi are noted within posterior aspect of the urinary bladder the largest measures 3.3 mm. 4. There is a low lying cecum. No pericecal inflammation. The appendix is not identified. Abundant stool noted in right colon and transverse colon. There is redundant transverse colon. No distal colonic obstruction. 5. No small bowel obstruction. 6. Degenerative changes lumbar spine. 7. Small hiatal hernia. Electronically Signed   By: Lahoma Crocker M.D.   On: 09/08/2015 16:55    2D ECHO: 09/16/2015 LV EF: 30-35% Study  Conclusions - Left ventricle: The cavity size was mildly dilated. Wall  thickness was normal. Systolic function was moderately to  severely reduced. The estimated ejection fraction was in the  range of 30% to 35%. Diffuse hypokinesis. Doppler parameters are  consistent with abnormal left ventricular relaxation (grade 1  diastolic dysfunction). - Mitral valve: Calcified annulus. There was moderate  regurgitation. - Left atrium: The atrium was mildly dilated. Impressions: - Moderate to severe global reduction in LV function; grade 1  diastolic dysfunction; mild LVE; mild LAE; moderate MR.    ASSESSMENT AND PLAN  1. Chest pain: symptoms felt to be atypical. She reported her pain as unlike her pain prior to previous PCI. Her troponins are not diagnostic of a non-STEMI as the trend is flat and they are only minimally elevated (0.04--> 0.06-->0.04). Pain may be related to ongoing pulmonary process. Would not plan further ischemia evaluation in house. Continue therapy for upper respiratory infection and anemia. FU echo shows EF 30% with moderate MR. Previous EF 40-45. Ischemic etiology needs to be excluded. I have arranged for an outpatient stress test next week and close follow up with Jory Sims NP.  2. Coronary artery disease: continue aspirin and statin.  3. URI: continue antibiotics.  4. Orthostatic hypotension: This apparently is a chronic issue.    5. Chronic kidney disease: creat 1.79, which appears to be around her baseline.   6. Acute on chronic normocytic anemia: possibly secondary to renal insufficiency. Further evaluation per primary care.  7. Cardiomyopathy: with worsening LV function, plan nuclear study as outpt; would be at risk for contrast nephropathy if cath required in the future; will try low dose coreg; no ACEI for now given h/o orthostatic hypotension.  8. PAT: noted on telemetry; started on low dose coreg and tolerating well.     Mable Fill R PA-C  Pager [phone]  I have seen and examined the patient along with Angelena Form R PA-C.  I have reviewed the chart, notes and new data.  I agree with PA's note.  Key new complaints: no chest pain Key examination changes: no overt CHF and no arrhythmia (PAT now quiet on telemetry). Key new findings / data: "plateau" increase in troponin is not consistent with ACS, ECG low risk  PLAN: Try to avoid contrast based procedures due to advanced CKD. Otpt nuclear perfusion study.  Sanda Klein, MD, [place] [phone] 09/18/2015, 9:32 AM

## 2015-09-18 NOTE — Care Management Obs Status (Signed)
[facility] NOTIFICATION   Patient Details  Name: CHARLESETTA GODDARD MRN: [id number] Date of Birth: [birth year]   Medicare Observation Status Notification Given:  Yes (nstemi)    Bethena Roys, RN 09/18/2015, 12:33 PM

## 2015-09-18 NOTE — Discharge Summary (Signed)
Physician Discharge Summary  Tiffany [name] [id number] DOB: 08/19/1940 DOA: 09/15/2015  PCP: No PCP Per Patient will follow up with cardiology NP  Admit date: 09/15/2015 Discharge date: 09/18/2015  Recommendations for Outpatient Follow-up:  1. Continue aspirin 81 mg daily instead of 325 mg daily duye to drop in hemoglobin of 7. Pt got 2 Units PRBC during this hospital stay. Hemoglobin prior to discharge was 9.7  Discharge Diagnoses:  Principal Problem:   NSTEMI (non-ST elevated myocardial infarction) [facility]) Active Problems:   Orthostatic hypotension   Sjogren's syndrome (HCC)   Hypertension   Anemia   Chronic kidney disease   Chest pain   Systemic lupus erythematosus (HCC)   Fever   Non-ST elevated myocardial infarction [facility])   Discharge Condition: stable   Diet recommendation: as tolerated   History of present illness:  75 year old female with past medical history of lupus, lymphoma, coronary artery disease, Had PCI of her right coronary artery in 2006, follow-up catheterization in 2007 which showed moderate nonobstructive coronary disease and stent in the right coronary, Echocardiogram September 2015 showed ejection fraction 40-45%, Moderate left atrial enlargement and mild mitral regurgitation. Pt has further history of Sjogren's, orthostatic hypotension, chronic kidney disease and COPD who presented to [facility] with left-sided chest pain. Patient reported having upper respiratory tract infection symptoms for few days and then developed chest pain after that. He started one day prior to the admission, nonradiating. Pain was present at rest and with exertion. Patient also noted associated shortness of breath but no diaphoresis. Pain lasted for 8 hours and then spontaneously resolved. Patient was transferred from [facility] to [facility] for further evaluation.   Hospital Course:   Non-ST elevation MI/unstable angina with history of CAD status post stenting in  2007  - Per cardiology, symptoms felt to be atypical - Troponin levels only minimally elevated, likely related to pulmonary process - Per cardiology, no further workup in the hospital. Outpatient stress test the week after discharge and close follow-up with cardiology per scheduled appointment - Patient will continue aspirin daily. Please note a drop in hemoglobin and need for 2 units of blood transfusion during this hospital stay. We will prescribe 81 mg aspirin daily instead of 325 mg but when she follows with cardiology please make adjustment to aspirin dose if considered safe - Per cardiology, stopped heparin drip  Upper respiratory tract symptoms - Concern for infectious process however no fevers, normal white blood cell count so we'll stop antibiotics prior to discharge - Blood cultures are negative - Urine culture with no significant bacterial growth  Orthostatic hypotension - Lasix on hold per cardiology  Chronic kidney disease stage III - Creatinine at baseline, 1.79  Anemia of chronic kidney disease - Please note patient has required 2 units of PRBC transfusion during this hospital stay - Posterior fusion hemoglobin is 9.7 - May continue iron supplementation  Chronic systolic and diastolic congestive heart failure - 2-D echo on this admission with ejection fraction of 35% and grade 1 diastolic dysfunction - Per cardiology, started low-dose carvedilol - No ACE inhibitor or lasix is given orthostatic hypotension  Dyslipidemia - Continue statin therapy  Neuropathy - Continue Elavil   Signed:  Leisa Lenz, MD  [facility] 09/18/2015, 9:54 AM  Pager #: [phone]  Time spent in minutes: more than 30 minutes   Discharge Exam: Filed Vitals:   09/18/15 0500 09/18/15 0755  BP: 122/67 122/67  Pulse:  79  Temp: 98.9 F (37.2 C)  Resp: 18    Filed Vitals:   09/17/15 2155 09/18/15 0500 09/18/15 0755 09/18/15 0800  BP: 127/57 122/67 122/67   Pulse: 79   79   Temp: 99.4 F (37.4 C) 98.9 F (37.2 C)    TempSrc: Oral Oral    Resp: 20 18    Height:      Weight:  58.469 kg (128 lb 14.4 oz)    SpO2: 99% 98%  99%    General: Pt is alert, follows commands appropriately, not in acute distress Cardiovascular: Regular rate and rhythm, S1/S2 +, no murmurs Respiratory: Clear to auscultation bilaterally, no wheezing, no crackles, no rhonchi Abdominal: Soft, non tender, non distended, bowel sounds +, no guarding Extremities: no edema, no cyanosis, pulses palpable bilaterally DP and PT Neuro: Grossly nonfocal  Discharge Instructions  Discharge Instructions    Call MD for:  difficulty breathing, headache or visual disturbances    Complete by:  As directed      Call MD for:  persistant dizziness or light-headedness    Complete by:  As directed      Call MD for:  persistant nausea and vomiting    Complete by:  As directed      Call MD for:  severe uncontrolled pain    Complete by:  As directed      Diet - low sodium heart healthy    Complete by:  As directed      Discharge instructions    Complete by:  As directed   1. Continue aspirin 81 mg daily instead of 325 mg daily duye to drop in hemoglobin of 7. Pt got 2 Units PRBC during this hospital stay. Hemoglobin prior to discharge was 9.7     Increase activity slowly    Complete by:  As directed             Medication List    STOP taking these medications        albuterol 108 (90 Base) MCG/ACT inhaler  Commonly known as:  PROVENTIL HFA;VENTOLIN HFA  Replaced by:  albuterol (2.5 MG/3ML) 0.083% nebulizer solution     cephALEXin 250 MG capsule  Commonly known as:  KEFLEX     furosemide 20 MG tablet  Commonly known as:  LASIX      TAKE these medications        acetaminophen 325 MG tablet  Commonly known as:  TYLENOL  Take 2 tablets (650 mg total) by mouth every 6 (six) hours as needed for mild pain (or Fever >/= 101).     albuterol (2.5 MG/3ML) 0.083% nebulizer solution   Commonly known as:  PROVENTIL  Take 3 mLs (2.5 mg total) by nebulization every 6 (six) hours as needed for wheezing or shortness of breath.     ALPRAZolam 0.5 MG tablet  Commonly known as:  XANAX  Take 0.5 mg by mouth 3 (three) times daily.     amitriptyline 100 MG tablet  Commonly known as:  ELAVIL  Take 100 mg by mouth daily.     aspirin EC 81 MG tablet  Take 81 mg by mouth at bedtime.     atorvastatin 40 MG tablet  Commonly known as:  LIPITOR  Take 1 tablet (40 mg total) by mouth at bedtime.     B-12 PO  Take 1 tablet by mouth daily.     carvedilol 3.125 MG tablet  Commonly known as:  COREG  Take 1 tablet (3.125 mg total) by mouth 2 (two) times  daily with a meal.     clobetasol cream 0.05 %  Commonly known as:  TEMOVATE  Apply 1 application topically 2 (two) times daily.     folic acid 1 MG tablet  Commonly known as:  FOLVITE  Take 1 mg by mouth every morning.     HYDROcodone-acetaminophen 5-325 MG tablet  Commonly known as:  NORCO/VICODIN  Take 1 tablet by mouth 3 (three) times daily.     hydroxychloroquine 200 MG tablet  Commonly known as:  PLAQUENIL  Take 400 mg by mouth daily. To be taken with food or milk     iron polysaccharides 150 MG capsule  Commonly known as:  NIFEREX  Take 150 mg by mouth 2 (two) times daily.     L-LYSINE PO  Take 1 tablet by mouth 2 (two) times daily.     levothyroxine 25 MCG tablet  Commonly known as:  SYNTHROID, LEVOTHROID  Take 25 mcg by mouth every morning.     medroxyPROGESTERone 10 MG tablet  Commonly known as:  PROVERA  Take 1 tablet (10 mg total) by mouth daily.     MUCINEX FAST-MAX 10-20-400-650 MG Pack  Generic drug:  Phenylephrine-DM-GG-APAP  Take 10-15 mLs by mouth every 6 (six) hours as needed (nasal congestion).     nitroGLYCERIN 0.4 MG SL tablet  Commonly known as:  NITROSTAT  Place 1 tablet (0.4 mg total) under the tongue every 5 (five) minutes as needed.     omeprazole 20 MG capsule  Commonly known as:   PRILOSEC  Take 20 mg by mouth 2 (two) times daily.     raloxifene 60 MG tablet  Commonly known as:  EVISTA  Take 60 mg by mouth every morning.     sodium bicarbonate 325 MG tablet  Take 650 mg by mouth 3 (three) times daily.     SOOTHE XP OP  Apply 1 drop to eye 3 (three) times daily.     Travoprost (BAK Free) 0.004 % Soln ophthalmic solution  Commonly known as:  TRAVATAN  Place 1 drop into both eyes at bedtime.     Vitamin D 400 units capsule  Take 400 Units by mouth every evening.           Follow-up Information    Follow up with [facility] On 09/25/2015.   Why:  Please show up at the radiology department at [facility] at 8:45 am for your stress test. Nothing to eat after midnight the night before.    Contact information:   [address] [place] [phone]      Follow up with Jory Sims, NP On 10/02/2015.   Specialties:  Nurse Practitioner, Radiology, Cardiology   Why:  @ 1:50pm   Contact information:   [place]  [address] [phone]        The results of significant diagnostics from this hospitalization (including imaging, microbiology, ancillary and laboratory) are listed below for reference.    Significant Diagnostic Studies: Dg Chest 2 View  09/15/2015  CLINICAL DATA:  Chest pain, weakness for 2 weeks, low blood pressure EXAM: CHEST  2 VIEW COMPARISON:  11/15/2014 FINDINGS: Cardiomediastinal silhouette is stable. Mild hyperinflation. No infiltrate or pleural effusion. No pulmonary edema. Osteopenia and mild degenerative changes thoracic spine. IMPRESSION: No active cardiopulmonary disease. Electronically Signed   By: Lahoma Crocker M.D.   On: 09/15/2015 16:01   Dg Chest Port 1 View  09/17/2015  CLINICAL DATA:  Productive cough with fever 2 days ago. No chest pain or shortness of breath. EXAM: PORTABLE CHEST 1 VIEW COMPARISON:  Sep 15, 2015 FINDINGS: Biapical pleural  parenchymal thickening is stable, slightly greater on the left than the right, likely scarring. No pneumothorax is identified on today's study. There is minimal opacity in the right base not seen yesterday, probably atelectasis. Minimal atelectasis is seen in the left base as well. A skin fold is seen over the left lateral chest. No convincing evidence of pneumothorax. The heart, hila, and mediastinum are unchanged and unremarkable. No overt edema. IMPRESSION: Mild bibasilar opacities, likely atelectasis. No other acute abnormalities. Electronically Signed   By: Dorise Bullion III M.D   On: 09/17/2015 09:27   Ct Renal Stone Study  09/08/2015  CLINICAL DATA:  Hematuria, burning urination starting this morning, flank pain EXAM: CT ABDOMEN AND PELVIS WITHOUT CONTRAST TECHNIQUE: Multidetector CT imaging of the abdomen and pelvis was performed following the standard protocol without IV contrast. COMPARISON:  Renal ultrasound 4/ 24/17 FINDINGS: Lower chest: Lung bases shows streaky atelectasis or scarring in left lower lobe posteriorly. Mild emphysematous changes. Small hiatal hernia. Hepatobiliary: Unenhanced liver shows no biliary ductal dilatation. The patient is status post cholecystectomy. No CBD dilatation. Pancreas: Unenhanced pancreas is mild atrophic. Spleen: Unenhanced spleen is unremarkable. Adrenals/Urinary Tract: Unenhanced adrenal glands are unremarkable. Multiple bilateral nonobstructive renal calculi are noted. The largest calculus in lower pole of the right kidney measures 5.3 mm. Largest calculus in lower pole of the left kidney measures 5.6 mm. Largest nonobstructive calculus in upper pole of the right kidney measures 4 mm. Mild dilatation of bilateral extrarenal pelvis. No hydronephrosis or hydroureter. No calcified ureteral calculi are noted bilaterally. At least 3 or 4 calcified calculi are noted within posterior aspect of the urinary bladder the largest measures 3.3 mm. Bilateral mild lobulated  renal contour. Mild bilateral cortical thinning probable due to atrophy. Again noted exophytic cyst in lower pole of the left kidney measures 8 mm. Stomach/Bowel: There is no gastric outlet obstruction. No small bowel obstruction. No thickened or dilated small bowel loops. There is a low lying cecum. No pericecal inflammation. Abundant stool noted within cecum and right colon. Abundant stool noted within redundant transverse colon. Moderate stool and gas noted within descending colon. The appendix is not identified. No distal colonic obstruction. Vascular/Lymphatic: Atherosclerotic calcifications of abdominal aorta and iliac arteries are noted. Reproductive: The uterus is atrophic. No adnexal masses noted. Small calcified adnexal vessels. Other: There is no ascites or free air. Musculoskeletal: No destructive bony lesions are noted within pelvis. Sagittal images of the spine shows osteopenia and degenerative changes lumbar spine. Significant disc space flattening with vacuum disc phenomenon and endplate sclerotic changes at L1-L2 and L5-S1 level. IMPRESSION: 1. There is bilateral nonobstructive nephrolithiasis. No hydronephrosis or hydroureter. No calcified ureteral calculi. Bilateral mild prominent extrarenal pelvis. 2. Again noted exophytic cyst in lower pole of the left kidney measures 8 mm. 3. Small calcified calculi are noted within posterior aspect of the urinary bladder the largest measures 3.3 mm. 4. There is a low lying cecum. No pericecal inflammation. The appendix is not identified. Abundant stool noted in right colon and transverse colon. There is redundant transverse colon. No distal colonic obstruction. 5. No small bowel obstruction. 6. Degenerative changes lumbar spine. 7. Small hiatal hernia. Electronically Signed   By: Lahoma Crocker M.D.   On: 09/08/2015 16:55    Microbiology: Recent Results (from the past 240 hour(s))  Urine culture     Status: Abnormal   Collection Time: 09/08/15  4:50 PM   Result Value Ref Range Status   Specimen Description URINE, CLEAN CATCH  Final   Special Requests NONE  Final   Culture (A)  Final    3,000 COLONIES/mL INSIGNIFICANT GROWTH Performed at [facility]    Report Status 09/10/2015 FINAL  Final  Blood culture (routine x 2)     Status: None (Preliminary result)   Collection Time: 09/15/15  2:40 PM  Result Value Ref Range Status   Specimen Description LEFT ANTECUBITAL  Final   Special Requests BOTTLES DRAWN AEROBIC AND ANAEROBIC 6CC  Final   Culture NO GROWTH 2 DAYS  Final   Report Status PENDING  Incomplete  Blood culture (routine x 2)     Status: None (Preliminary result)   Collection Time: 09/15/15  2:46 PM  Result Value Ref Range Status   Specimen Description RIGHT ANTECUBITAL  Final   Special Requests BOTTLES DRAWN AEROBIC AND ANAEROBIC 6CC  Final   Culture NO GROWTH 2 DAYS  Final   Report Status PENDING  Incomplete  Urine culture     Status: Abnormal   Collection Time: 09/15/15  5:20 PM  Result Value Ref Range Status   Specimen Description URINE, CATHETERIZED  Final   Special Requests NONE  Final   Culture (A)  Final    8,000 COLONIES/mL INSIGNIFICANT GROWTH Performed at [facility]    Report Status 09/17/2015 FINAL  Final  MRSA PCR Screening     Status: Abnormal   Collection Time: 09/15/15  9:20 PM  Result Value Ref Range Status   MRSA by PCR POSITIVE (A) NEGATIVE Final     Labs: Basic Metabolic Panel:  Recent Labs Lab 09/15/15 1410 09/16/15 0320 09/17/15 0843  NA 137 137 139  K 4.1 3.7 3.7  CL 111 111 117*  CO2 19* 14* 15*  GLUCOSE 86 75 110*  BUN 25* 22* 16  CREATININE 2.26* 1.94* 1.79*  CALCIUM 8.8* 8.0* 7.9*   Liver Function Tests:  Recent Labs Lab 09/15/15 1410 09/16/15 0320  AST 18 15  ALT 16 13*  ALKPHOS 52 41  BILITOT 0.2* 0.3  PROT 7.6 6.1*  ALBUMIN 2.9* 2.1*   No results for input(s): LIPASE, AMYLASE in the last 168 hours. No results for input(s): AMMONIA in the last 168  hours. CBC:  Recent Labs Lab 09/15/15 1410 09/16/15 0320 09/17/15 0523 09/18/15 0040  WBC 7.4 7.6 5.4  --   NEUTROABS 5.6  --   --   --   HGB 9.1* 7.5* 7.0* 9.7*  HCT 28.2* 24.5* 22.7* 29.6*  MCV 92.5 93.2 93.0  --   PLT 192 166 160  --    Cardiac Enzymes:  Recent Labs Lab 09/15/15 1410 09/15/15 1559 09/16/15 0320 09/16/15 1115 09/16/15 1428  TROPONINI 0.04* 0.04* 0.04* 0.06* 0.04*   BNP: BNP (last 3 results)  Recent Labs  09/16/15 1115  BNP 187.6*    ProBNP (last 3 results) No results for input(s): PROBNP in the last 8760 hours.  CBG: No results for input(s): GLUCAP in the last 168 hours.

## 2015-09-18 NOTE — Progress Notes (Signed)
Patient being discharged home per MD order. All discharge instructions verbally gone over with patient and family. A copy of paperwork was given to patient at discharge including a RX for Coreg. Verbalized understanding.

## 2015-09-19 LAB — TYPE AND SCREEN
ABO/RH(D): A NEG
ANTIBODY SCREEN: POSITIVE
DAT, IgG: NEGATIVE
DONOR AG TYPE: NEGATIVE
DONOR AG TYPE: NEGATIVE
Donor AG Type: NEGATIVE
Donor AG Type: NEGATIVE
UNIT DIVISION: 0
UNIT DIVISION: 0
Unit division: 0
Unit division: 0

## 2015-09-20 LAB — CULTURE, BLOOD (ROUTINE X 2)
CULTURE: NO GROWTH
CULTURE: NO GROWTH

## 2015-09-21 ENCOUNTER — Other Ambulatory Visit: Payer: Self-pay | Admitting: Physician Assistant

## 2015-09-21 DIAGNOSIS — I429 Cardiomyopathy, unspecified: Secondary | ICD-10-CM

## 2015-09-25 ENCOUNTER — Encounter (HOSPITAL_COMMUNITY)
Admission: RE | Admit: 2015-09-25 | Discharge: 2015-09-25 | Disposition: A | Discharge status: Home / Self Care | Payer: Medicare Other | Source: Ambulatory Visit | Attending: Physician Assistant | Admitting: Physician Assistant

## 2015-09-25 ENCOUNTER — Other Ambulatory Visit: Payer: Self-pay | Admitting: Orthopedic Surgery

## 2015-09-25 ENCOUNTER — Inpatient Hospital Stay (HOSPITAL_COMMUNITY): Admit: 2015-09-25 | Payer: Medicare Other

## 2015-09-25 DIAGNOSIS — R931 Abnormal findings on diagnostic imaging of heart and coronary circulation: Secondary | ICD-10-CM | POA: Insufficient documentation

## 2015-09-25 DIAGNOSIS — I429 Cardiomyopathy, unspecified: Secondary | ICD-10-CM | POA: Insufficient documentation

## 2015-09-25 DIAGNOSIS — I5189 Other ill-defined heart diseases: Secondary | ICD-10-CM | POA: Diagnosis not present

## 2015-09-25 LAB — NM MYOCAR MULTI W/SPECT W/WALL MOTION / EF
CHL CUP NUCLEAR SDS: 12
CHL CUP NUCLEAR SRS: 1
LHR: 0.06
LV sys vol: 86 mL
LVDIAVOL: 119 mL (ref 46–106)
Peak HR: 103 {beats}/min
Rest HR: 85 {beats}/min
SSS: 13
TID: 1

## 2015-09-25 MED ORDER — SODIUM CHLORIDE 0.9% FLUSH
INTRAVENOUS | Status: AC
  Administered 2015-09-25: 10 mL via INTRAVENOUS
  Filled 2015-09-25: qty 10

## 2015-09-25 MED ORDER — TECHNETIUM TC 99M TETROFOSMIN IV KIT
30.0000 | PACK | Freq: Once | INTRAVENOUS | Status: AC | PRN
  Administered 2015-09-25: 30 via INTRAVENOUS

## 2015-09-25 MED ORDER — TECHNETIUM TC 99M TETROFOSMIN IV KIT
10.0000 | PACK | Freq: Once | INTRAVENOUS | Status: AC | PRN
  Administered 2015-09-25: 10 via INTRAVENOUS

## 2015-09-25 MED ORDER — REGADENOSON 0.4 MG/5ML IV SOLN
INTRAVENOUS | Status: AC
  Administered 2015-09-25: 0.4 mg via INTRAVENOUS
  Filled 2015-09-25: qty 5

## 2015-09-28 ENCOUNTER — Observation Stay (HOSPITAL_COMMUNITY)
Admission: EM | Admit: 2015-09-28 | Discharge: 2015-09-30 | Disposition: A | Discharge status: Home / Self Care | Payer: Medicare Other | Attending: Internal Medicine | Admitting: Internal Medicine

## 2015-09-28 ENCOUNTER — Emergency Department (HOSPITAL_COMMUNITY): Payer: Medicare Other

## 2015-09-28 ENCOUNTER — Encounter (HOSPITAL_COMMUNITY): Payer: Self-pay | Admitting: Emergency Medicine

## 2015-09-28 ENCOUNTER — Other Ambulatory Visit: Payer: Self-pay

## 2015-09-28 DIAGNOSIS — I25119 Atherosclerotic heart disease of native coronary artery with unspecified angina pectoris: Secondary | ICD-10-CM | POA: Diagnosis present

## 2015-09-28 DIAGNOSIS — I959 Hypotension, unspecified: Secondary | ICD-10-CM | POA: Diagnosis not present

## 2015-09-28 DIAGNOSIS — Z87891 Personal history of nicotine dependence: Secondary | ICD-10-CM | POA: Insufficient documentation

## 2015-09-28 DIAGNOSIS — Z888 Allergy status to other drugs, medicaments and biological substances status: Secondary | ICD-10-CM | POA: Insufficient documentation

## 2015-09-28 DIAGNOSIS — M069 Rheumatoid arthritis, unspecified: Secondary | ICD-10-CM | POA: Diagnosis not present

## 2015-09-28 DIAGNOSIS — N184 Chronic kidney disease, stage 4 (severe): Secondary | ICD-10-CM | POA: Diagnosis present

## 2015-09-28 DIAGNOSIS — Z9049 Acquired absence of other specified parts of digestive tract: Secondary | ICD-10-CM | POA: Diagnosis not present

## 2015-09-28 DIAGNOSIS — J209 Acute bronchitis, unspecified: Secondary | ICD-10-CM | POA: Insufficient documentation

## 2015-09-28 DIAGNOSIS — R011 Cardiac murmur, unspecified: Secondary | ICD-10-CM | POA: Insufficient documentation

## 2015-09-28 DIAGNOSIS — R05 Cough: Secondary | ICD-10-CM | POA: Diagnosis not present

## 2015-09-28 DIAGNOSIS — R531 Weakness: Secondary | ICD-10-CM | POA: Diagnosis not present

## 2015-09-28 DIAGNOSIS — M5134 Other intervertebral disc degeneration, thoracic region: Secondary | ICD-10-CM | POA: Insufficient documentation

## 2015-09-28 DIAGNOSIS — I252 Old myocardial infarction: Secondary | ICD-10-CM | POA: Diagnosis not present

## 2015-09-28 DIAGNOSIS — I214 Non-ST elevation (NSTEMI) myocardial infarction: Secondary | ICD-10-CM | POA: Diagnosis present

## 2015-09-28 DIAGNOSIS — F329 Major depressive disorder, single episode, unspecified: Secondary | ICD-10-CM | POA: Diagnosis not present

## 2015-09-28 DIAGNOSIS — M3500 Sicca syndrome, unspecified: Secondary | ICD-10-CM | POA: Diagnosis present

## 2015-09-28 DIAGNOSIS — I2510 Atherosclerotic heart disease of native coronary artery without angina pectoris: Secondary | ICD-10-CM | POA: Insufficient documentation

## 2015-09-28 DIAGNOSIS — K449 Diaphragmatic hernia without obstruction or gangrene: Secondary | ICD-10-CM | POA: Diagnosis not present

## 2015-09-28 DIAGNOSIS — Z8572 Personal history of non-Hodgkin lymphomas: Secondary | ICD-10-CM | POA: Diagnosis not present

## 2015-09-28 DIAGNOSIS — I5042 Chronic combined systolic (congestive) and diastolic (congestive) heart failure: Secondary | ICD-10-CM | POA: Insufficient documentation

## 2015-09-28 DIAGNOSIS — D638 Anemia in other chronic diseases classified elsewhere: Secondary | ICD-10-CM | POA: Diagnosis not present

## 2015-09-28 DIAGNOSIS — J440 Chronic obstructive pulmonary disease with acute lower respiratory infection: Principal | ICD-10-CM | POA: Diagnosis present

## 2015-09-28 DIAGNOSIS — R0602 Shortness of breath: Secondary | ICD-10-CM | POA: Diagnosis not present

## 2015-09-28 DIAGNOSIS — M329 Systemic lupus erythematosus, unspecified: Secondary | ICD-10-CM | POA: Diagnosis present

## 2015-09-28 DIAGNOSIS — D649 Anemia, unspecified: Secondary | ICD-10-CM | POA: Diagnosis present

## 2015-09-28 DIAGNOSIS — E039 Hypothyroidism, unspecified: Secondary | ICD-10-CM | POA: Insufficient documentation

## 2015-09-28 DIAGNOSIS — Z91048 Other nonmedicinal substance allergy status: Secondary | ICD-10-CM | POA: Diagnosis not present

## 2015-09-28 DIAGNOSIS — N200 Calculus of kidney: Secondary | ICD-10-CM | POA: Diagnosis not present

## 2015-09-28 DIAGNOSIS — I130 Hypertensive heart and chronic kidney disease with heart failure and stage 1 through stage 4 chronic kidney disease, or unspecified chronic kidney disease: Secondary | ICD-10-CM | POA: Insufficient documentation

## 2015-09-28 DIAGNOSIS — M8580 Other specified disorders of bone density and structure, unspecified site: Secondary | ICD-10-CM | POA: Diagnosis not present

## 2015-09-28 DIAGNOSIS — R404 Transient alteration of awareness: Secondary | ICD-10-CM | POA: Diagnosis not present

## 2015-09-28 DIAGNOSIS — J9811 Atelectasis: Secondary | ICD-10-CM | POA: Insufficient documentation

## 2015-09-28 DIAGNOSIS — F17210 Nicotine dependence, cigarettes, uncomplicated: Secondary | ICD-10-CM | POA: Diagnosis not present

## 2015-09-28 DIAGNOSIS — Z882 Allergy status to sulfonamides status: Secondary | ICD-10-CM | POA: Diagnosis not present

## 2015-09-28 DIAGNOSIS — I4891 Unspecified atrial fibrillation: Secondary | ICD-10-CM | POA: Diagnosis not present

## 2015-09-28 DIAGNOSIS — I951 Orthostatic hypotension: Secondary | ICD-10-CM | POA: Diagnosis not present

## 2015-09-28 DIAGNOSIS — E785 Hyperlipidemia, unspecified: Secondary | ICD-10-CM | POA: Diagnosis present

## 2015-09-28 DIAGNOSIS — Z7982 Long term (current) use of aspirin: Secondary | ICD-10-CM | POA: Insufficient documentation

## 2015-09-28 DIAGNOSIS — J189 Pneumonia, unspecified organism: Secondary | ICD-10-CM

## 2015-09-28 HISTORY — DX: Low back pain, unspecified: M54.50

## 2015-09-28 HISTORY — DX: Other chronic pain: G89.29

## 2015-09-28 HISTORY — DX: Low back pain: M54.5

## 2015-09-28 HISTORY — DX: Pneumonia, unspecified organism: J18.9

## 2015-09-28 HISTORY — DX: Personal history of other medical treatment: Z92.89

## 2015-09-28 HISTORY — DX: Rheumatoid arthritis, unspecified: M06.9

## 2015-09-28 HISTORY — DX: Calculus of kidney: N20.0

## 2015-09-28 LAB — TROPONIN I: TROPONIN I: 0.03 ng/mL (ref <0.031)

## 2015-09-28 LAB — CBC WITH DIFFERENTIAL/PLATELET
BASOS ABS: 0 10*3/uL (ref 0.0–0.1)
BASOS PCT: 0 %
EOS ABS: 0.2 10*3/uL (ref 0.0–0.7)
Eosinophils Relative: 3 %
HCT: 34.1 % — ABNORMAL LOW (ref 36.0–46.0)
HEMOGLOBIN: 10.8 g/dL — AB (ref 12.0–15.0)
Lymphocytes Relative: 16 %
Lymphs Abs: 1.4 10*3/uL (ref 0.7–4.0)
MCH: 27.8 pg (ref 26.0–34.0)
MCHC: 31.7 g/dL (ref 30.0–36.0)
MCV: 87.9 fL (ref 78.0–100.0)
MONOS PCT: 10 %
Monocytes Absolute: 0.9 10*3/uL (ref 0.1–1.0)
NEUTROS PCT: 71 %
Neutro Abs: 6.4 10*3/uL (ref 1.7–7.7)
Platelets: 223 10*3/uL (ref 150–400)
RBC: 3.88 MIL/uL (ref 3.87–5.11)
RDW: 16.3 % — ABNORMAL HIGH (ref 11.5–15.5)
WBC: 9 10*3/uL (ref 4.0–10.5)

## 2015-09-28 LAB — BASIC METABOLIC PANEL
ANION GAP: 9 (ref 5–15)
BUN: 26 mg/dL — ABNORMAL HIGH (ref 6–20)
CHLORIDE: 110 mmol/L (ref 101–111)
CO2: 18 mmol/L — ABNORMAL LOW (ref 22–32)
CREATININE: 1.95 mg/dL — AB (ref 0.44–1.00)
Calcium: 8.9 mg/dL (ref 8.9–10.3)
GFR calc non Af Amer: 24 mL/min — ABNORMAL LOW (ref >60)
GFR, EST AFRICAN AMERICAN: 28 mL/min — AB (ref >60)
Glucose, Bld: 86 mg/dL (ref 65–99)
Potassium: 3.7 mmol/L (ref 3.5–5.1)
SODIUM: 137 mmol/L (ref 135–145)

## 2015-09-28 LAB — I-STAT TROPONIN, ED
TROPONIN I, POC: 0.04 ng/mL (ref 0.00–0.08)
TROPONIN I, POC: 0.03 ng/mL (ref 0.00–0.08)

## 2015-09-28 LAB — PROTIME-INR
INR: 1.19 (ref 0.00–1.49)
PROTHROMBIN TIME: 15.3 s — AB (ref 11.6–15.2)

## 2015-09-28 LAB — LACTIC ACID, PLASMA
LACTIC ACID, VENOUS: 0.7 mmol/L (ref 0.5–2.0)
Lactic Acid, Venous: 1 mmol/L (ref 0.5–2.0)

## 2015-09-28 MED ORDER — ATORVASTATIN CALCIUM 40 MG PO TABS
40.0000 mg | ORAL_TABLET | Freq: Every morning | ORAL | Status: DC
  Administered 2015-09-29 – 2015-09-30 (×2): 40 mg via ORAL
  Filled 2015-09-28 (×2): qty 1

## 2015-09-28 MED ORDER — ADULT MULTIVITAMIN W/MINERALS CH
1.0000 | ORAL_TABLET | Freq: Every day | ORAL | Status: DC
  Administered 2015-09-30: 1 via ORAL
  Filled 2015-09-28 (×3): qty 1

## 2015-09-28 MED ORDER — AMITRIPTYLINE HCL 50 MG PO TABS: 100.0000 mg | ORAL_TABLET | Freq: Every day | ORAL | Status: DC

## 2015-09-28 MED ORDER — LEVOFLOXACIN IN D5W 750 MG/150ML IV SOLN
750.0000 mg | INTRAVENOUS | Status: DC
  Administered 2015-09-29: 750 mg via INTRAVENOUS
  Filled 2015-09-28: qty 150

## 2015-09-28 MED ORDER — LEVOTHYROXINE SODIUM 25 MCG PO TABS
25.0000 ug | ORAL_TABLET | Freq: Every morning | ORAL | Status: DC
  Administered 2015-09-29 – 2015-09-30 (×2): 25 ug via ORAL
  Filled 2015-09-28 (×2): qty 1

## 2015-09-28 MED ORDER — ENOXAPARIN SODIUM 30 MG/0.3ML ~~LOC~~ SOLN
30.0000 mg | SUBCUTANEOUS | Status: DC
  Administered 2015-09-28 – 2015-09-29 (×2): 30 mg via SUBCUTANEOUS
  Filled 2015-09-28 (×2): qty 0.3

## 2015-09-28 MED ORDER — PANTOPRAZOLE SODIUM 40 MG PO TBEC
40.0000 mg | DELAYED_RELEASE_TABLET | Freq: Every day | ORAL | Status: DC
  Administered 2015-09-29 – 2015-09-30 (×2): 40 mg via ORAL
  Filled 2015-09-28 (×2): qty 1

## 2015-09-28 MED ORDER — FLUDROCORTISONE ACETATE 0.1 MG PO TABS
0.1000 mg | ORAL_TABLET | Freq: Every day | ORAL | Status: DC
  Administered 2015-09-29 – 2015-09-30 (×2): 0.1 mg via ORAL
  Filled 2015-09-28 (×3): qty 1

## 2015-09-28 MED ORDER — MEDROXYPROGESTERONE ACETATE 10 MG PO TABS
10.0000 mg | ORAL_TABLET | Freq: Every day | ORAL | Status: DC
  Administered 2015-09-29 – 2015-09-30 (×2): 10 mg via ORAL
  Filled 2015-09-28 (×3): qty 1

## 2015-09-28 MED ORDER — SODIUM CHLORIDE 0.9 % IV SOLN
INTRAVENOUS | Status: DC
  Administered 2015-09-28: 15:00:00 via INTRAVENOUS

## 2015-09-28 MED ORDER — CHOLECALCIFEROL 10 MCG (400 UNIT) PO TABS
400.0000 [IU] | ORAL_TABLET | Freq: Every evening | ORAL | Status: DC
  Administered 2015-09-29: 400 [IU] via ORAL
  Filled 2015-09-28 (×3): qty 1

## 2015-09-28 MED ORDER — SODIUM CHLORIDE 0.9 % IV SOLN
INTRAVENOUS | Status: DC
  Administered 2015-09-29: 1000 mL via INTRAVENOUS

## 2015-09-28 MED ORDER — DEXTROSE 5 % IV SOLN
500.0000 mg | Freq: Once | INTRAVENOUS | Status: AC
  Administered 2015-09-28: 500 mg via INTRAVENOUS
  Filled 2015-09-28: qty 500

## 2015-09-28 MED ORDER — OMEGA-3-ACID ETHYL ESTERS 1 G PO CAPS
1.0000 g | ORAL_CAPSULE | Freq: Every day | ORAL | Status: DC
  Filled 2015-09-28 (×2): qty 1

## 2015-09-28 MED ORDER — CYCLOSPORINE 0.05 % OP EMUL
1.0000 [drp] | Freq: Two times a day (BID) | OPHTHALMIC | Status: DC
  Administered 2015-09-28 – 2015-09-30 (×4): 1 [drp] via OPHTHALMIC
  Filled 2015-09-28 (×5): qty 1

## 2015-09-28 MED ORDER — ASPIRIN EC 81 MG PO TBEC
81.0000 mg | DELAYED_RELEASE_TABLET | Freq: Every day | ORAL | Status: DC
  Administered 2015-09-28 – 2015-09-29 (×2): 81 mg via ORAL
  Filled 2015-09-28 (×2): qty 1

## 2015-09-28 MED ORDER — AMITRIPTYLINE HCL 50 MG PO TABS
100.0000 mg | ORAL_TABLET | Freq: Every day | ORAL | Status: DC
  Administered 2015-09-28 – 2015-09-29 (×2): 100 mg via ORAL
  Filled 2015-09-28 (×2): qty 2

## 2015-09-28 MED ORDER — SODIUM BICARBONATE 650 MG PO TABS
650.0000 mg | ORAL_TABLET | Freq: Three times a day (TID) | ORAL | Status: DC
  Administered 2015-09-28 – 2015-09-30 (×5): 650 mg via ORAL
  Filled 2015-09-28 (×5): qty 1

## 2015-09-28 MED ORDER — POTASSIUM CHLORIDE CRYS ER 10 MEQ PO TBCR
10.0000 meq | EXTENDED_RELEASE_TABLET | Freq: Four times a day (QID) | ORAL | Status: DC
  Administered 2015-09-29 – 2015-09-30 (×6): 10 meq via ORAL
  Filled 2015-09-28 (×7): qty 1

## 2015-09-28 MED ORDER — RALOXIFENE HCL 60 MG PO TABS
60.0000 mg | ORAL_TABLET | Freq: Every morning | ORAL | Status: DC
  Administered 2015-09-29 – 2015-09-30 (×2): 60 mg via ORAL
  Filled 2015-09-28 (×2): qty 1

## 2015-09-28 MED ORDER — FOLIC ACID 1 MG PO TABS
1.0000 mg | ORAL_TABLET | Freq: Every morning | ORAL | Status: DC
  Administered 2015-09-29 – 2015-09-30 (×2): 1 mg via ORAL
  Filled 2015-09-28 (×2): qty 1

## 2015-09-28 MED ORDER — HYDROCODONE-ACETAMINOPHEN 5-325 MG PO TABS
1.0000 | ORAL_TABLET | Freq: Three times a day (TID) | ORAL | Status: DC | PRN
  Administered 2015-09-28 – 2015-09-29 (×2): 1 via ORAL
  Filled 2015-09-28 (×2): qty 1

## 2015-09-28 MED ORDER — ALBUTEROL SULFATE (2.5 MG/3ML) 0.083% IN NEBU: 2.5000 mg | INHALATION_SOLUTION | Freq: Four times a day (QID) | RESPIRATORY_TRACT | Status: DC | PRN

## 2015-09-28 MED ORDER — HYDROXYCHLOROQUINE SULFATE 200 MG PO TABS
200.0000 mg | ORAL_TABLET | Freq: Every day | ORAL | Status: DC
  Administered 2015-09-29 – 2015-09-30 (×2): 200 mg via ORAL
  Filled 2015-09-28 (×2): qty 1

## 2015-09-28 MED ORDER — LATANOPROST 0.005 % OP SOLN
1.0000 [drp] | Freq: Every day | OPHTHALMIC | Status: DC
  Administered 2015-09-28 – 2015-09-29 (×2): 1 [drp] via OPHTHALMIC
  Filled 2015-09-28: qty 2.5

## 2015-09-28 MED ORDER — BIOTIN 5000 MCG PO CAPS: 5000.0000 ug | ORAL_CAPSULE | Freq: Every day | ORAL | Status: DC

## 2015-09-28 MED ORDER — DEXTROSE 5 % IV SOLN
1.0000 g | Freq: Once | INTRAVENOUS | Status: AC
  Administered 2015-09-28: 1 g via INTRAVENOUS
  Filled 2015-09-28: qty 10

## 2015-09-28 MED ORDER — ALPRAZOLAM 0.5 MG PO TABS
0.5000 mg | ORAL_TABLET | Freq: Three times a day (TID) | ORAL | Status: DC
  Administered 2015-09-28 – 2015-09-30 (×5): 0.5 mg via ORAL
  Filled 2015-09-28 (×5): qty 1

## 2015-09-28 NOTE — Progress Notes (Signed)
Tiffany [name] [id number] Admission Data: 09/28/2015 6:53 PM Attending Provider: Albertine Patricia, MD  PCP:No PCP Per Patient Consults/ Treatment Team:    Tiffany [name] is a 75 y.o. female patient admitted from ED awake, alert  & orientated  X 3,  Full Code, VSS - Blood pressure 131/49, pulse 68, temperature 98.2 F (36.8 C), temperature source Oral, resp. rate 17, height 5\' 10"  (1.778 m), weight 55.52 kg (122 lb 6.4 oz), SpO2 100 %.,no c/o shortness of breath, no c/o chest pain, no distress noted. Tele # [id number] placed.   IV site [id number] NS at 44ml/hour.  Allergies:   Allergies  Allergen Reactions  . Florinef [Fludrocortisone Acetate]     Malaise: Discomfort   . Iohexol Other (See Comments)    Pt. States that Dr. Florene Glen her kidney Dr. Does not want her to receive iv dye due to increased risk of kidney failure.   . Sulfonamide Derivatives Hives  . Tape Rash    ADHESIVE     Past Medical History  Diagnosis Date  . Lymphoma ([place])   . Systemic lupus erythematosus ([place])   . Depression   . Hyperlipidemia   . CAD (coronary artery disease)     DES to mid RCA 2006  . Sjogren's syndrome ([place])   . Orthostatic hypotension   . Dysphagia   . Syncope   . Anemia   . CKD (chronic kidney disease) stage 3, GFR 30-59 ml/min   . COPD (chronic obstructive pulmonary disease) ([place])   . PMB (postmenopausal bleeding) 06/12/2015      Pt orientation to unit, room and routine. Information packet given to patient/family and safety video watched.  Admission INP armband ID verified with patient/family, and in place. SR up x 2, fall risk assessment complete with Patient and family verbalizing understanding of risks associated with falls. Pt verbalizes an understanding of how to use the call bell and to call for help before getting out of bed.  Skin, clean-dry- intact without evidence of bruising, or skin tears.   No evidence of skin break down noted on exam. Some generalized bruising noted.       Will cont to monitor and assist as needed.  Dayle Points, RN 09/28/2015 6:53 PM

## 2015-09-28 NOTE — ED Notes (Signed)
Pt left for XRay 

## 2015-09-28 NOTE — ED Notes (Signed)
Pt back from X-Ray.  

## 2015-09-28 NOTE — ED Provider Notes (Signed)
CSN: [id number]     Arrival date & time 09/28/15  1216 History   First MD Initiated Contact with Patient 09/28/15 1217     Chief Complaint  Patient presents with  . Cough  . Weakness     (Consider location/radiation/quality/duration/timing/severity/associated sxs/prior Treatment) Patient is a 75 y.o. female presenting with cough. The history is provided by the patient, medical records and the EMS personnel. No language interpreter was used.  Cough Cough characteristics:  Non-productive Severity:  Moderate Onset quality:  Gradual Duration:  3 days Timing:  Constant Progression:  Worsening Chronicity:  New Smoker: yes   Context: upper respiratory infection   Relieved by:  Nothing Worsened by:  Deep breathing and activity Ineffective treatments:  None tried Associated symptoms: chills, fever ("low grade" ) and shortness of breath   Associated symptoms: no chest pain, no headaches, no rash and no rhinorrhea   Associated symptoms comment:  Cough with post-tussive emesis   Past Medical History  Diagnosis Date  . Lymphoma ([place])   . Systemic lupus erythematosus ([place])   . Depression   . Hyperlipidemia   . CAD (coronary artery disease)     DES to mid RCA 2006  . Sjogren's syndrome ([place])   . Orthostatic hypotension   . Dysphagia   . Syncope   . Anemia   . CKD (chronic kidney disease) stage 3, GFR 30-59 ml/min   . COPD (chronic obstructive pulmonary disease) ([place])   . PMB (postmenopausal bleeding) 06/12/2015   Past Surgical History  Procedure Laterality Date  . Breast excisional biopsy Left   . Eye surgery    . Mass excision      Lymphoma  . Coronary angioplasty with stent placement    . Cholecystectomy    . Cataract extraction Bilateral   . Neck surgery     Family History  Problem Relation Age of Onset  . Coronary artery disease Father   . Coronary artery disease Mother   . Liver disease Mother   . Esophageal cancer Brother   . Breast cancer Sister    Social  History  Substance Use Topics  . Smoking status: Current Every Day Smoker -- 0.25 packs/day for 23 years    Types: Cigarettes    Start date: 07/14/1986  . Smokeless tobacco: Never Used  . Alcohol Use: No   OB History    Gravida Para Term Preterm AB TAB SAB Ectopic Multiple Living   3 3        3      Review of Systems  Constitutional: Positive for fever ("low grade" ), chills and fatigue.  HENT: Positive for congestion. Negative for rhinorrhea.   Eyes: Negative for visual disturbance.  Respiratory: Positive for cough and shortness of breath.   Cardiovascular: Negative for chest pain and leg swelling.  Gastrointestinal: Positive for nausea and vomiting. Negative for abdominal pain, diarrhea and constipation.  Endocrine: Negative for polyuria.  Genitourinary: Negative for dysuria and difficulty urinating.  Musculoskeletal: Negative for back pain and neck pain.  Skin: Negative for pallor and rash.  Neurological: Negative for dizziness and headaches.  Psychiatric/Behavioral: Negative for confusion.  All other systems reviewed and are negative.     Allergies  Florinef; Iohexol; Sulfonamide derivatives; and Tape  Home Medications   Prior to Admission medications   Medication Sig Start Date End Date Taking? Authorizing Provider  acetaminophen (TYLENOL) 325 MG tablet Take 2 tablets (650 mg total) by mouth every 6 (six) hours as needed for mild pain (or  Fever >/= 101). 09/18/15   Robbie Lis, MD  albuterol (PROVENTIL) (2.5 MG/3ML) 0.083% nebulizer solution Take 3 mLs (2.5 mg total) by nebulization every 6 (six) hours as needed for wheezing or shortness of breath. 09/18/15   Robbie Lis, MD  ALPRAZolam Duanne Moron) 0.5 MG tablet Take 0.5 mg by mouth 3 (three) times daily.     Historical Provider, MD  amitriptyline (ELAVIL) 100 MG tablet Take 100 mg by mouth daily. 07/21/14   Historical Provider, MD  Artificial Tear Solution (SOOTHE XP OP) Apply 1 drop to eye 3 (three) times daily.      Historical Provider, MD  aspirin EC 81 MG tablet Take 81 mg by mouth at bedtime.     Historical Provider, MD  atorvastatin (LIPITOR) 40 MG tablet Take 1 tablet (40 mg total) by mouth at bedtime. Patient taking differently: Take 40 mg by mouth every morning.  04/21/13   Lendon Colonel, NP  carvedilol (COREG) 3.125 MG tablet Take 1 tablet (3.125 mg total) by mouth 2 (two) times daily with a meal. 09/18/15   Robbie Lis, MD  Cholecalciferol (VITAMIN D) 400 UNITS capsule Take 400 Units by mouth every evening.     Historical Provider, MD  clobetasol cream (TEMOVATE) [id number] % Apply 1 application topically 2 (two) times daily.    Historical Provider, MD  Cyanocobalamin (B-12 PO) Take 1 tablet by mouth daily.    Historical Provider, MD  folic acid (FOLVITE) 1 MG tablet Take 1 mg by mouth every morning.     Historical Provider, MD  HYDROcodone-acetaminophen (NORCO/VICODIN) 5-325 MG per tablet Take 1 tablet by mouth 3 (three) times daily.     Historical Provider, MD  hydroxychloroquine (PLAQUENIL) 200 MG tablet Take 400 mg by mouth daily. To be taken with food or milk 02/07/13   Historical Provider, MD  iron polysaccharides (NIFEREX) 150 MG capsule Take 150 mg by mouth 2 (two) times daily.     Historical Provider, MD  L-LYSINE PO Take 1 tablet by mouth 2 (two) times daily.     Historical Provider, MD  levothyroxine (SYNTHROID, LEVOTHROID) 25 MCG tablet Take 25 mcg by mouth every morning.     Historical Provider, MD  medroxyPROGESTERone (PROVERA) 10 MG tablet Take 1 tablet (10 mg total) by mouth daily. 06/21/15   Florian Buff, MD  nitroGLYCERIN (NITROSTAT) 0.4 MG SL tablet Place 1 tablet (0.4 mg total) under the tongue every 5 (five) minutes as needed. 05/20/11   Lendon Colonel, NP  omeprazole (PRILOSEC) 20 MG capsule Take 20 mg by mouth 2 (two) times daily.     Historical Provider, MD  Phenylephrine-DM-GG-APAP ([place] FAST-MAX) 10-20-400-650 MG PACK Take 10-15 mLs by mouth every 6 (six) hours as needed  (nasal congestion).    Historical Provider, MD  raloxifene (EVISTA) 60 MG tablet Take 60 mg by mouth every morning.     Historical Provider, MD  sodium bicarbonate 325 MG tablet Take 650 mg by mouth 3 (three) times daily.     Historical Provider, MD  Travoprost, BAK Free, (TRAVATAN) 0.004 % SOLN ophthalmic solution Place 1 drop into both eyes at bedtime.    Historical Provider, MD   BP 114/69 mmHg  Pulse 78  Temp(Src) 98.1 F (36.7 C) (Oral)  Resp 18  SpO2 100% Physical Exam  Constitutional: She is oriented to person, place, and time. She appears well-developed and well-nourished. No distress.  HENT:  Head: Normocephalic and atraumatic.  Eyes: EOM are normal. Pupils are  equal, round, and reactive to light.  Neck: Normal range of motion. Neck supple.  Cardiovascular: Normal rate, regular rhythm and intact distal pulses.   Pulmonary/Chest: Effort normal and breath sounds normal. No respiratory distress.  Faint bibasilar crackles  Abdominal: Soft. She exhibits no distension. There is no tenderness.  Musculoskeletal: Normal range of motion. She exhibits no edema or tenderness.  Neurological: She is alert and oriented to person, place, and time.  Skin: Skin is warm and dry. No rash noted.  Psychiatric: She has a normal mood and affect.  Nursing note and vitals reviewed.   ED Course  Procedures (including critical care time) Labs Review Labs Reviewed  CBC WITH DIFFERENTIAL/PLATELET - Abnormal; Notable for the following:    Hemoglobin 10.8 (*)    HCT 34.1 (*)    RDW 16.3 (*)    All other components within normal limits  BASIC METABOLIC PANEL - Abnormal; Notable for the following:    CO2 18 (*)    BUN 26 (*)    Creatinine, Ser 1.95 (*)    GFR calc non Af Amer 24 (*)    GFR calc Af Amer 28 (*)    All other components within normal limits  PROTIME-INR - Abnormal; Notable for the following:    Prothrombin Time 15.3 (*)    All other components within normal limits  CULTURE, BLOOD  (ROUTINE X 2)  CULTURE, BLOOD (ROUTINE X 2)  LACTIC ACID, PLASMA  LACTIC ACID, PLASMA  I-STAT TROPOININ, ED    Imaging Review Dg Chest 2 View  09/28/2015  CLINICAL DATA:  Weakness and dizziness with hypotension EXAM: CHEST  2 VIEW COMPARISON:  Sep 17, 2015 FINDINGS: There is mild bibasilar atelectatic change. There is stable bilateral apical pleural thickening. There is no frank edema or consolidation. Heart size and pulmonary vascularity are normal. No adenopathy. There is degenerative change in thoracic spine. Bones appear osteoporotic. IMPRESSION: Patchy bibasilar atelectasis. Stable apical pleural thickening bilaterally. No edema or consolidation. Stable cardiac silhouette. Electronically Signed   By: Lowella Grip III M.D.   On: 09/28/2015 13:24   I have personally reviewed and evaluated these images and lab results as part of my medical decision-making.   EKG Interpretation   Date/Time:  Thursday Sep 28 2015 12:23:37 EDT Ventricular Rate:  82 PR Interval:    QRS Duration: 106 QT Interval:  405 QTC Calculation: 473 R Axis:   78 Text Interpretation:  Baseline wander in lead(s) V6 background noise  Otherwise no significant change Confirmed by FLOYD MD, Quillian Quince [phone]) on  09/28/2015 3:06:05 PM      MDM   Final diagnoses:  CAP (community acquired pneumonia)   75 year old female with history of lupus, coronary disease status post PCI in 2006, recent admission 5/5  To 5/8 NSTEMI and anemia rquiring blood transfusions,presents with concern for hypotension and possible pneumonia. Patient was evaluated in urgent care today for having several days of cough and shortness of breath. She was found to be hypotensive in the 80s over 50s prior to arrival. She was transferred to the ED for further evaluation.  She received about 200 mL of fluids prior to arrival with improvement in her blood pressure to low [id number] systolic on arrival.   Patient is afebrile, dyspneic but maintaining  appropriate O2 sats on room air. Exam significant for mild bibasilar crackles. EKG does not show acute ischemic changes. I-STAT troponin within normal limits. Mild elevation in BUN and creatinine at 26 and 1.85 respectively compared with baseline (  Cr 1.7). Metabolic acidosis that appears near patient's baseline and is likely secondary to chronic kidney disease. Improved anemia compared with prior with hemoglobin 10.8. There is no leukocytosis. Lactic acid is normal. Chest x-ray with patchy bibasilar atelectasis.Given patient's symptoms of borderline hypertension, SOB and cough, blood cultures obtained and patient was covered for community acquired pneumonia with azithromycin and Rocephin. Hospitalist service is consulted for admission and triage of patient. Plan to admit for gentle IV hydration, antobiotics, and further observation.  Patient seen and discussed with Dr. Tyrone Nine, ED attending     Gibson Ramp, MD 09/28/15 [place], DO 09/29/15 2018

## 2015-09-28 NOTE — H&P (Signed)
History and Physical    Tiffany [name] [id number] DOB: [birth year] DOA: 09/28/2015   PCP: No PCP Per Patient [place]  Patient coming from/Resides with: Private residence/lives with granddaughter and granddaughter's husband  Chief Complaint: Cough and persistent weakness and dizziness  HPI: Tiffany [name] is a 74 y.o. female with medical history significant for Sjogren's syndrome syndrome, lupus, chronic kidney disease, hypertension, anemia, chronic systolic heart failure, history of CAD with PCI of RCA in 2006 with follow-up catheterization 2007 moderate nonobstructive coronary disease and the stent of the right coronary. She was recently admitted  for orthostatic hypotension and non-STEMI. Her Lasix was stopped, cardiology evaluated her and recommended outpatient stress test and start her on very low-dose carvedilol. Although she did not have any signs of frank bleeding her hemoglobin dropped during the hospitalization he was given 2 units of packed red blood cells and her aspirin was decreased from 325 mg to 81 mg daily. She is also started on Florinef for her orthostatic hypotension or in the previous admission.  She returns to the ER today complaining of productive cough with sputum so thick she is unable to cough it all the way up so she saw's it. She denies fevers or chills. She is still dizzy with standing. She states she underwent her stress test on 5/15 in reasonable but still has an appointment pending to meet with the cardiologist to discuss the results. She has not had any other infectious type symptoms such as urinary tract infection, nausea vomiting abdominal pain or diarrhea.  ED Course:  PO temp 98.1-BP 102/74-pulse 78 -respirations 18-from her saturations 100%  2 view chest x-ray: Patchy bibasilar atelectasis without edema or consolidation  Lab data: Sodium 137, potassium 3.7, CO2 18, BUN 26, creatinine 1.95, troponin times 20.04 and 0.03, lactic acid 20.7 and  1.0, WBCs 9000 with neutrophils 71% and absolute neutrophils 6.4%, hemoglobin 10.8, platelets 223,000; coags normal, glucose 86, blood cultures 2 obtained in the ER Rocephin 1 g IV 1 Zithromax 500 mg IV 1  Review of Systems:  In addition to the HPI above,  No Fever-chills, myalgias or other constitutional symptoms No Headache, changes with Vision or hearing, new weakness, tingling, numbness in any extremity, No problems swallowing food or Liquids, indigestion/reflux No Chest pain, palpitations, orthopnea or DOE No Abdominal pain, N/V; no melena or hematochezia, no dark tarry stools, Bowel movements are regular, No dysuria, hematuria or flank pain No new skin rashes, lesions, masses or bruises, No new joints pains-aches No recent weight gain or loss No polyuria, polydypsia or polyphagia,   Past Medical History  Diagnosis Date  . Lymphoma ([place])   . Systemic lupus erythematosus ([place])   . Depression   . Hyperlipidemia   . CAD (coronary artery disease)     DES to mid RCA 2006  . Sjogren's syndrome ([place])   . Orthostatic hypotension   . Dysphagia   . Syncope   . Anemia   . CKD (chronic kidney disease) stage 3, GFR 30-59 ml/min   . COPD (chronic obstructive pulmonary disease) ([place])   . PMB (postmenopausal bleeding) 06/12/2015    Past Surgical History  Procedure Laterality Date  . Breast excisional biopsy Left   . Eye surgery    . Mass excision      Lymphoma  . Coronary angioplasty with stent placement    . Cholecystectomy    . Cataract extraction Bilateral   . Neck surgery       reports that she  has been smoking Cigarettes.  She started smoking about 29 years ago. She has a 5.75 pack-year smoking history. She has never used smokeless tobacco. She reports that she does not drink alcohol or use illicit drugs.  Mobility: Since recent admission is now having to utilize a rolling walker which previously she was able to mobilize without Work history: Did not  obtain   Allergies  Allergen Reactions  . Florinef [Fludrocortisone Acetate]     Malaise: Discomfort   . Iohexol Other (See Comments)    Pt. States that Dr. Florene Glen her kidney Dr. Does not want her to receive iv dye due to increased risk of kidney failure.   . Sulfonamide Derivatives Hives  . Tape Rash    ADHESIVE    Family History  Problem Relation Age of Onset  . Coronary artery disease Father   . Coronary artery disease Mother   . Liver disease Mother   . Esophageal cancer Brother   . Breast cancer Sister     Prior to Admission medications   Medication Sig Start Date End Date Taking? Authorizing Provider  acetaminophen (TYLENOL) 325 MG tablet Take 2 tablets (650 mg total) by mouth every 6 (six) hours as needed for mild pain (or Fever >/= 101). 09/18/15  Yes Robbie Lis, MD  albuterol (PROVENTIL) (2.5 MG/3ML) 0.083% nebulizer solution Take 3 mLs (2.5 mg total) by nebulization every 6 (six) hours as needed for wheezing or shortness of breath. 09/18/15  Yes Robbie Lis, MD  ALPRAZolam Duanne Moron) 0.5 MG tablet Take 0.5 mg by mouth 3 (three) times daily.    Yes Historical Provider, MD  amitriptyline (ELAVIL) 100 MG tablet Take 100 mg by mouth daily. 07/21/14  Yes Historical Provider, MD  Artificial Tear Solution (SOOTHE XP OP) Apply 1 drop to eye 3 (three) times daily.    Yes Historical Provider, MD  aspirin EC 81 MG tablet Take 81 mg by mouth at bedtime.    Yes Historical Provider, MD  atorvastatin (LIPITOR) 40 MG tablet Take 1 tablet (40 mg total) by mouth at bedtime. Patient taking differently: Take 40 mg by mouth every morning.  04/21/13  Yes Lendon Colonel, NP  Biotin 5000 MCG CAPS Take 5,000 mcg by mouth daily.   Yes Historical Provider, MD  carvedilol (COREG) 3.125 MG tablet Take 1 tablet (3.125 mg total) by mouth 2 (two) times daily with a meal. 09/18/15  Yes Robbie Lis, MD  Cholecalciferol (VITAMIN D) 400 UNITS capsule Take 400 Units by mouth every evening.    Yes  Historical Provider, MD  clobetasol cream (TEMOVATE) [id number] % Apply 1 application topically 2 (two) times daily.   Yes Historical Provider, MD  cycloSPORINE (RESTASIS) 0.05 % ophthalmic emulsion Place 1 drop into both eyes 2 (two) times daily.   Yes Historical Provider, MD  docusate sodium (COLACE) 100 MG capsule Take 100 mg by mouth 2 (two) times daily as needed for mild constipation.   Yes Historical Provider, MD  fludrocortisone (FLORINEF) 0.1 MG tablet Take 0.1 mg by mouth daily.   Yes Historical Provider, MD  folic acid (FOLVITE) 1 MG tablet Take 1 mg by mouth every morning.    Yes Historical Provider, MD  HYDROcodone-acetaminophen (NORCO/VICODIN) 5-325 MG per tablet Take 1 tablet by mouth 3 (three) times daily as needed for moderate pain.    Yes Historical Provider, MD  hydroxychloroquine (PLAQUENIL) 200 MG tablet Take 200 mg by mouth daily. To be taken with food or milk 02/07/13  Yes Historical Provider, MD  levothyroxine (SYNTHROID, LEVOTHROID) 25 MCG tablet Take 25 mcg by mouth every morning.    Yes Historical Provider, MD  medroxyPROGESTERone (PROVERA) 10 MG tablet Take 1 tablet (10 mg total) by mouth daily. 06/21/15  Yes Florian Buff, MD  Multiple Vitamin (MULTIVITAMIN) tablet Take 1 tablet by mouth daily.   Yes Historical Provider, MD  Multiple Vitamins-Minerals (PRESERVISION/LUTEIN PO) Take 1 capsule by mouth daily.   Yes Historical Provider, MD  nitroGLYCERIN (NITROSTAT) 0.4 MG SL tablet Place 1 tablet (0.4 mg total) under the tongue every 5 (five) minutes as needed. 05/20/11  Yes Lendon Colonel, NP  Omega-3 Fatty Acids (FISH OIL) 1000 MG CAPS Take 1,000 mg by mouth daily.   Yes Historical Provider, MD  omeprazole (PRILOSEC) 20 MG capsule Take 20 mg by mouth daily.    Yes Historical Provider, MD  potassium chloride (K-DUR,KLOR-CON) 10 MEQ tablet Take 10 mEq by mouth 4 (four) times daily.   Yes Historical Provider, MD  raloxifene (EVISTA) 60 MG tablet Take 60 mg by mouth every morning.     Yes Historical Provider, MD  sodium bicarbonate 325 MG tablet Take 650 mg by mouth 3 (three) times daily.    Yes Historical Provider, MD  Travoprost, BAK Free, (TRAVATAN) 0.004 % SOLN ophthalmic solution Place 1 drop into both eyes at bedtime.   Yes Historical Provider, MD    Physical Exam: Filed Vitals:   09/28/15 1530 09/28/15 1600 09/28/15 1700 09/28/15 1730  BP: 114/69 112/58 110/64 121/66  Pulse: 78 80 76 79  Temp:      TempSrc:      Resp: 18 19 21 18   SpO2: 100% 100% 99% 100%      Constitutional: NAD, calm, comfortable Eyes: PERRL, lids and conjunctivae normal ENMT: Mucous membranes are moist. Posterior pharynx clear of any exudate or lesions.Normal dentition.  Neck: normal, supple, no masses, no thyromegaly Respiratory: Coarse with scattered expiratory wheezes more prominent on right, no crackles. Normal respiratory effort. No accessory muscle use.  Cardiovascular: Regular rate and rhythm, no murmurs / rubs / gallops. No extremity edema. 2+ pedal pulses. No carotid bruits.  Abdomen: no tenderness, no masses palpated. No hepatosplenomegaly. Bowel sounds positive.  Musculoskeletal: no clubbing / cyanosis. No joint deformity upper and lower extremities. Good ROM, no contractures. Normal muscle tone.  Skin: no rashes, lesions, ulcers. No induration Neurologic: CN 2-12 grossly intact. Sensation intact, DTR normal. Strength 5/5 x all 4 extremities.  Psychiatric: Normal judgment and insight. Alert and oriented x 3. Normal mood.    Labs on Admission: I have personally reviewed following labs and imaging studies  CBC:  Recent Labs Lab 09/28/15 1249  WBC 9.0  NEUTROABS 6.4  HGB 10.8*  HCT 34.1*  MCV 87.9  PLT [id number]   Basic Metabolic Panel:  Recent Labs Lab 09/28/15 1249  NA 137  K 3.7  CL 110  CO2 18*  GLUCOSE 86  BUN 26*  CREATININE 1.95*  CALCIUM 8.9   GFR: Estimated Creatinine Clearance: 23.7 mL/min (by C-G formula based on Cr of 1.95). Liver Function  Tests: No results for input(s): AST, ALT, ALKPHOS, BILITOT, PROT, ALBUMIN in the last 168 hours. No results for input(s): LIPASE, AMYLASE in the last 168 hours. No results for input(s): AMMONIA in the last 168 hours. Coagulation Profile:  Recent Labs Lab 09/28/15 1249  INR 1.19   Cardiac Enzymes: No results for input(s): CKTOTAL, CKMB, CKMBINDEX, TROPONINI in the last 168 hours. BNP (last 3  results) No results for input(s): PROBNP in the last 8760 hours. HbA1C: No results for input(s): HGBA1C in the last 72 hours. CBG: No results for input(s): GLUCAP in the last 168 hours. Lipid Profile: No results for input(s): CHOL, HDL, LDLCALC, TRIG, CHOLHDL, LDLDIRECT in the last 72 hours. Thyroid Function Tests: No results for input(s): TSH, T4TOTAL, FREET4, T3FREE, THYROIDAB in the last 72 hours. Anemia Panel: No results for input(s): VITAMINB12, FOLATE, FERRITIN, TIBC, IRON, RETICCTPCT in the last 72 hours. Urine analysis:    Component Value Date/Time   COLORURINE YELLOW 09/15/2015 1516   APPEARANCEUR HAZY* 09/15/2015 1516   LABSPEC 1.025 09/15/2015 1516   PHURINE 6.0 09/15/2015 1516   GLUCOSEU NEGATIVE 09/15/2015 1516   HGBUR LARGE* 09/15/2015 [place] 09/15/2015 1516   KETONESUR NEGATIVE 09/15/2015 1516   PROTEINUR 100* 09/15/2015 1516   UROBILINOGEN 0.2 11/03/2014 1100   NITRITE NEGATIVE 09/15/2015 1516   LEUKOCYTESUR TRACE* 09/15/2015 1516   Sepsis Labs: @LABRCNTIP (procalcitonin:4,lacticidven:4) )No results found for this or any previous visit (from the past 240 hour(s)).   Radiological Exams on Admission: Dg Chest 2 View  09/28/2015  CLINICAL DATA:  Weakness and dizziness with hypotension EXAM: CHEST  2 VIEW COMPARISON:  Sep 17, 2015 FINDINGS: There is mild bibasilar atelectatic change. There is stable bilateral apical pleural thickening. There is no frank edema or consolidation. Heart size and pulmonary vascularity are normal. No adenopathy. There is  degenerative change in thoracic spine. Bones appear osteoporotic. IMPRESSION: Patchy bibasilar atelectasis. Stable apical pleural thickening bilaterally. No edema or consolidation. Stable cardiac silhouette. Electronically Signed   By: Lowella Grip III M.D.   On: 09/28/2015 13:24    EKG: (Independently reviewed) Atrial fibrillation with ventricular rate 82 bpm, QTC 473 ms, elevated J-point in inferior lateral leads which is similar to previous EKG  Assessment/Plan Principal Problem:   Acute bronchitis with COPD  -Patient reports cough with thick sputum she is unable to expectorate and ongoing tobacco abuse although she quit several days ago -Continue preadmission nabs -Empiric Levaquin -Pulmonary toiletry  Active Problems:   Orthostatic hypotension -Last admission Lasix was discontinued and patient was started on Florinef -Continues to report dizziness so we'll continue to hold Lasix and will go ahead and hold carvedilol for now -Repeat orthostatic vital signs    Coronary artery disease/Recent NSTEMI (non-ST elevated myocardial infarction) -No reports of chest pain since arrival and enzymes 2 have been negative but we will continue to cycle -Patient underwent stress test on 5/15 and results have not been discussed with her but appears somewhat abnormal therefore she will need formal cardiology consultation prior to discharge to ensure her dizziness and other symptoms are not related to her coronary disease    Hyperlipidemia -Continue statin and omega-3 3 fatty acids    Anemia -Hemoglobin stable and higher than discharge hemoglobin of 9.7 and is currently 10.8    CKD (chronic kidney disease), stage IV  -Renal function stable and at baseline BUN 26 and creatinine 1.95    Sjogren's syndrome/Systemic lupus erythematosus       DVT prophylaxis: Lovenox Code Status: Full code Family Communication: No family at bedside Disposition Plan: Discharge back to previous home  environment when medically stable Consults called: Cardiology needs to be consulted in a.m. on 5/19  Admission status: Observation/telemetry    Reginia Battie L. ANP-BC [facility] Pager [phone]   If 7PM-7AM, please contact night-coverage [email or web address] Password [facility]  09/28/2015, 5:54 PM

## 2015-09-28 NOTE — ED Notes (Addendum)
Pt to ER via ambulance as transfer from [facility] with complaint of persistent cough, generalized weakness, and shortness of breath. Pt recently discharged after being treated for pneumonia. Pt sent to ER for cardiac changes noted to ER. Pt is alert and oriented x4. RR unlabored on arrival.

## 2015-09-29 DIAGNOSIS — J440 Chronic obstructive pulmonary disease with acute lower respiratory infection: Secondary | ICD-10-CM | POA: Diagnosis not present

## 2015-09-29 DIAGNOSIS — R931 Abnormal findings on diagnostic imaging of heart and coronary circulation: Secondary | ICD-10-CM

## 2015-09-29 DIAGNOSIS — E785 Hyperlipidemia, unspecified: Secondary | ICD-10-CM

## 2015-09-29 DIAGNOSIS — I2510 Atherosclerotic heart disease of native coronary artery without angina pectoris: Secondary | ICD-10-CM

## 2015-09-29 DIAGNOSIS — I25118 Atherosclerotic heart disease of native coronary artery with other forms of angina pectoris: Secondary | ICD-10-CM | POA: Diagnosis not present

## 2015-09-29 DIAGNOSIS — I951 Orthostatic hypotension: Secondary | ICD-10-CM

## 2015-09-29 DIAGNOSIS — D649 Anemia, unspecified: Secondary | ICD-10-CM | POA: Diagnosis not present

## 2015-09-29 DIAGNOSIS — M3500 Sicca syndrome, unspecified: Secondary | ICD-10-CM

## 2015-09-29 DIAGNOSIS — N184 Chronic kidney disease, stage 4 (severe): Secondary | ICD-10-CM

## 2015-09-29 DIAGNOSIS — J189 Pneumonia, unspecified organism: Secondary | ICD-10-CM | POA: Diagnosis not present

## 2015-09-29 DIAGNOSIS — M329 Systemic lupus erythematosus, unspecified: Secondary | ICD-10-CM

## 2015-09-29 LAB — HIV ANTIBODY (ROUTINE TESTING W REFLEX): HIV SCREEN 4TH GENERATION: NONREACTIVE

## 2015-09-29 LAB — CBC
HEMATOCRIT: 30.7 % — AB (ref 36.0–46.0)
Hemoglobin: 9.5 g/dL — ABNORMAL LOW (ref 12.0–15.0)
MCH: 27.3 pg (ref 26.0–34.0)
MCHC: 30.9 g/dL (ref 30.0–36.0)
MCV: 88.2 fL (ref 78.0–100.0)
PLATELETS: 224 10*3/uL (ref 150–400)
RBC: 3.48 MIL/uL — ABNORMAL LOW (ref 3.87–5.11)
RDW: 16.1 % — AB (ref 11.5–15.5)
WBC: 6.3 10*3/uL (ref 4.0–10.5)

## 2015-09-29 LAB — COMPREHENSIVE METABOLIC PANEL
ALBUMIN: 2 g/dL — AB (ref 3.5–5.0)
ALT: 14 U/L (ref 14–54)
AST: 14 U/L — AB (ref 15–41)
Alkaline Phosphatase: 52 U/L (ref 38–126)
Anion gap: 9 (ref 5–15)
BUN: 23 mg/dL — AB (ref 6–20)
CHLORIDE: 113 mmol/L — AB (ref 101–111)
CO2: 18 mmol/L — ABNORMAL LOW (ref 22–32)
Calcium: 8.5 mg/dL — ABNORMAL LOW (ref 8.9–10.3)
Creatinine, Ser: 1.82 mg/dL — ABNORMAL HIGH (ref 0.44–1.00)
GFR calc Af Amer: 31 mL/min — ABNORMAL LOW (ref >60)
GFR calc non Af Amer: 26 mL/min — ABNORMAL LOW (ref >60)
GLUCOSE: 82 mg/dL (ref 65–99)
POTASSIUM: 3.4 mmol/L — AB (ref 3.5–5.1)
SODIUM: 140 mmol/L (ref 135–145)
Total Bilirubin: 0.5 mg/dL (ref 0.3–1.2)
Total Protein: 6.1 g/dL — ABNORMAL LOW (ref 6.5–8.1)

## 2015-09-29 LAB — TROPONIN I
Troponin I: 0.03 ng/mL (ref <0.031)
Troponin I: 0.04 ng/mL — ABNORMAL HIGH (ref <0.031)

## 2015-09-29 NOTE — Progress Notes (Signed)
PROGRESS NOTE    Tiffany [name]  [id number] DOB: 12-27-1940 DOA: 09/28/2015 PCP: Cleda Mccreedy, MD   Brief Narrative:  On 09/28/2015 by Ms. Erin Hearing, NP [name] is a 75 y.o. female with medical history significant for Sjogren's syndrome syndrome, lupus, chronic kidney disease, hypertension, anemia, chronic systolic heart failure, history of CAD with PCI of RCA in 2006 with follow-up catheterization 2007 moderate nonobstructive coronary disease and the stent of the right coronary. She was recently admitted for orthostatic hypotension and non-STEMI. Her Lasix was stopped, cardiology evaluated her and recommended outpatient stress test and start her on very low-dose carvedilol. Although she did not have any signs of frank bleeding her hemoglobin dropped during the hospitalization he was given 2 units of packed red blood cells and her aspirin was decreased from 325 mg to 81 mg daily. She is also started on Florinef for her orthostatic hypotension or in the previous admission.  She returns to the ER today complaining of productive cough with sputum so thick she is unable to cough it all the way up so she saw's it. She denies fevers or chills. She is still dizzy with standing. She states she underwent her stress test on 5/15 in reasonable but still has an appointment pending to meet with the cardiologist to discuss the results. She has not had any other infectious type symptoms such as urinary tract infection, nausea vomiting abdominal pain or diarrhea.  Assessment & Plan   Acute bronchitis with COPD  -Patient reports cough with thick sputum she is unable to expectorate and ongoing tobacco abuse although she quit several days ago -Continue nebs, levaquin, pulmonary toilet -CXR: No edema or consolidation  Orthostatic hypotension -Last admission Lasix was discontinued and patient was started on Florinef -Continues to report dizziness, lasix and coreg held  -Repeat orthostatic vital  signs  Coronary artery disease/Recent NSTEMI (non-ST elevated myocardial infarction) -No reports of chest pain since arrival and enzymes 2 have been negative but we will continue to cycle -Patient underwent stress test on 5/15, high risk -Cardiology consulted and appreciated -Question if patient's dizziness and fatigue may be cardiac realted -Follows with Dr. Domenic Polite  Hyperlipidemia -Continue statin and omega-3 3 fatty acids  Anemia secondary to chronic disease -Hemoglobin currently 9.5 -baseline 9-11 -Patient required blood transfusion during last admission  CKD (chronic kidney disease), stage IV  -Renal function stable and close to basline -Currently creatinine 1.82  Sjogren's syndrome/Systemic lupus erythematosus  -Continue home medications: Restasis, Plaquenil  Hypothyroidism -Continue Synthroid -Will check TSH  DVT Prophylaxis  lovenox  Code Status: Full  Family Communication: None at bedside  Disposition Plan: Admitted. Pending cardiology recommenations  Consultants Cardiology  Procedures  None  Antibiotics   Anti-infectives    Start     Dose/Rate Route Frequency Ordered Stop   09/29/15 1530  levofloxacin (LEVAQUIN) IVPB 750 mg     750 mg 100 mL/hr over 90 Minutes Intravenous Every 48 hours 09/28/15 1831 10/05/15 1529   09/28/15 1845  hydroxychloroquine (PLAQUENIL) tablet 200 mg     200 mg Oral Daily 09/28/15 1831     09/28/15 1345  cefTRIAXone (ROCEPHIN) 1 g in dextrose 5 % 50 mL IVPB     1 g 100 mL/hr over 30 Minutes Intravenous  Once 09/28/15 1344 09/28/15 1459   09/28/15 1345  azithromycin (ZITHROMAX) 500 mg in dextrose 5 % 250 mL IVPB     500 mg 250 mL/hr over 60 Minutes Intravenous  Once 09/28/15 1344 09/28/15 1621  Subjective:   Tiffany [name] seen and examined today.  Patient states she is feeling better. She still feels weak and dizzy.  She has been having shortness of breath walking around her home.  Denies chest pain, abdominal pain,  nausea, vomiting.   Objective:   Filed Vitals:   09/28/15 1730 09/28/15 1837 09/28/15 2144 09/29/15 0536  BP: 121/66 131/49 124/51 112/57  Pulse: 79 68 69 66  Temp:  98.2 F (36.8 C) 98 F (36.7 C) 97.7 F (36.5 C)  TempSrc:  Oral    Resp: 18 17 16 16   Height:  5\' 10"  (1.778 m)    Weight:  55.52 kg (122 lb 6.4 oz)    SpO2: 100% 100% 98% 99%    Intake/Output Summary (Last 24 hours) at 09/29/15 1358 Last data filed at 09/29/15 0957  Gross per 24 hour  Intake    840 ml  Output      0 ml  Net    840 ml   Filed Weights   09/28/15 1837  Weight: 55.52 kg (122 lb 6.4 oz)    Exam  General: Well developed, well nourished, NAD, appears stated age  HEENT: NCAT, mucous membranes moist.   Cardiovascular: S1 S2 auscultated, no rubs, murmurs or gallops. Regular rate and rhythm.  Respiratory: Clear to auscultation bilaterally, occ cough   Abdomen: Soft, nontender, nondistended, + bowel sounds  Extremities: warm dry without cyanosis clubbing or edema  Neuro: AAOx3, nonfocal  Psych: Normal affect and demeanor   Data Reviewed: I have personally reviewed following labs and imaging studies  CBC:  Recent Labs Lab 09/28/15 1249 09/29/15 0308  WBC 9.0 6.3  NEUTROABS 6.4  --   HGB 10.8* 9.5*  HCT 34.1* 30.7*  MCV 87.9 88.2  PLT 223 [id number]   Basic Metabolic Panel:  Recent Labs Lab 09/28/15 1249 09/29/15 0308  NA 137 140  K 3.7 3.4*  CL 110 113*  CO2 18* 18*  GLUCOSE 86 82  BUN 26* 23*  CREATININE 1.95* 1.82*  CALCIUM 8.9 8.5*   GFR: Estimated Creatinine Clearance: 24.1 mL/min (by C-G formula based on Cr of 1.82). Liver Function Tests:  Recent Labs Lab 09/29/15 0308  AST 14*  ALT 14  ALKPHOS 52  BILITOT 0.5  PROT 6.1*  ALBUMIN 2.0*   No results for input(s): LIPASE, AMYLASE in the last 168 hours. No results for input(s): AMMONIA in the last 168 hours. Coagulation Profile:  Recent Labs Lab 09/28/15 1249  INR 1.19   Cardiac Enzymes:  Recent  Labs Lab 09/28/15 2050 09/29/15 0308 09/29/15 0801  TROPONINI 0.03 0.03 0.04*   BNP (last 3 results) No results for input(s): PROBNP in the last 8760 hours. HbA1C: No results for input(s): HGBA1C in the last 72 hours. CBG: No results for input(s): GLUCAP in the last 168 hours. Lipid Profile: No results for input(s): CHOL, HDL, LDLCALC, TRIG, CHOLHDL, LDLDIRECT in the last 72 hours. Thyroid Function Tests: No results for input(s): TSH, T4TOTAL, FREET4, T3FREE, THYROIDAB in the last 72 hours. Anemia Panel: No results for input(s): VITAMINB12, FOLATE, FERRITIN, TIBC, IRON, RETICCTPCT in the last 72 hours. Urine analysis:    Component Value Date/Time   COLORURINE YELLOW 09/15/2015 1516   APPEARANCEUR HAZY* 09/15/2015 1516   LABSPEC 1.025 09/15/2015 1516   PHURINE 6.0 09/15/2015 1516   GLUCOSEU NEGATIVE 09/15/2015 1516   HGBUR LARGE* 09/15/2015 1516   BILIRUBINUR NEGATIVE 09/15/2015 1516   KETONESUR NEGATIVE 09/15/2015 1516   PROTEINUR 100* 09/15/2015 1516  UROBILINOGEN 0.2 11/03/2014 1100   NITRITE NEGATIVE 09/15/2015 1516   LEUKOCYTESUR TRACE* 09/15/2015 1516   Sepsis Labs: @LABRCNTIP (procalcitonin:4,lacticidven:4)  )No results found for this or any previous visit (from the past 240 hour(s)).    Radiology Studies: Dg Chest 2 View  09/28/2015  CLINICAL DATA:  Weakness and dizziness with hypotension EXAM: CHEST  2 VIEW COMPARISON:  Sep 17, 2015 FINDINGS: There is mild bibasilar atelectatic change. There is stable bilateral apical pleural thickening. There is no frank edema or consolidation. Heart size and pulmonary vascularity are normal. No adenopathy. There is degenerative change in thoracic spine. Bones appear osteoporotic. IMPRESSION: Patchy bibasilar atelectasis. Stable apical pleural thickening bilaterally. No edema or consolidation. Stable cardiac silhouette. Electronically Signed   By: Lowella Grip III M.D.   On: 09/28/2015 13:24     Scheduled Meds: .  ALPRAZolam  0.5 mg Oral TID  . amitriptyline  100 mg Oral QHS  . aspirin EC  81 mg Oral QHS  . atorvastatin  40 mg Oral q morning - 10a  . cholecalciferol  400 Units Oral QPM  . cycloSPORINE  1 drop Both Eyes BID  . enoxaparin (LOVENOX) injection  30 mg Subcutaneous Q24H  . fludrocortisone  0.1 mg Oral Daily  . folic acid  1 mg Oral q morning - 10a  . hydroxychloroquine  200 mg Oral Daily  . latanoprost  1 drop Both Eyes QHS  . levofloxacin (LEVAQUIN) IV  750 mg Intravenous Q48H  . levothyroxine  25 mcg Oral q morning - 10a  . medroxyPROGESTERone  10 mg Oral Daily  . multivitamin with minerals  1 tablet Oral Daily  . omega-3 acid ethyl esters  1 g Oral Daily  . pantoprazole  40 mg Oral Daily  . potassium chloride  10 mEq Oral QID  . raloxifene  60 mg Oral q morning - 10a  . sodium bicarbonate  650 mg Oral TID   Continuous Infusions: . sodium chloride 1,000 mL (09/29/15 0508)     Time Spent in minutes   30 minutes  Damion Kant D.O. on 09/29/2015 at 1:58 PM  Between 7am to 7pm - Pager - [phone]  After 7pm go to [email or web address] - password [email or web address]  And look for the night coverage person covering for me after hours  [facility] Office  [phone]

## 2015-09-29 NOTE — Care Management Obs Status (Signed)
[place] NOTIFICATION   Patient Details  Name: Tiffany [name] MRN: [id number] Date of Birth: Apr 24, 1941   Medicare Observation Status Notification Given:  Yes    Carles Collet, RN 09/29/2015, 2:23 PM

## 2015-09-29 NOTE — Care Management Note (Signed)
Case Management Note  Patient Details  Name: NIYATI AMBLE MRN: [id number] Date of Birth: [birth year]  Subjective/Objective:                 Spoke with patient in the room. She states that she has PCS from [facility] that supplies her with caregiver 9-5. She uses a walker at home, and lives with her granddaughter. Patient denies needing any assistance with HH, transportation, or medications.  Action/Plan:  Will continue to follow for DC planning   Expected Discharge Date:                  Expected Discharge Plan:  Home/Self Care  In-House Referral:     Discharge planning Services  CM Consult  Post Acute Care Choice:  NA Choice offered to:     DME Arranged:    DME Agency:     HH Arranged:    HH Agency:     Status of Service:  In process, will continue to follow  Medicare Important Message Given:    Date Medicare IM Given:    Medicare IM give by:    Date Additional Medicare IM Given:    Additional Medicare Important Message give by:     If discussed at [place] of Stay Meetings, dates discussed:    Additional Comments:  Carles Collet, RN 09/29/2015, 2:41 PM

## 2015-09-29 NOTE — Consult Note (Signed)
CARDIOLOGY CONSULT NOTE   Patient ID: Tiffany [name] MRN: [id number] DOB/AGE: [birth year] 75 y.o.  Admit date: 09/28/2015  Primary Physician   Tiffany Mccreedy, MD Primary Cardiologist   Dr. Domenic [name] Reason for Consultation  Recent high risk stress test  HPI: Tiffany [name] is a 75 yo female with PMHx of CAD s/p PCI of RCA in 2006 (follow up cath 2007 showed moderate nonobstructive coronary disease and patent stent in the right coronary), HFrEF, orthostatic hypotension, Sjogren's Syndrome, HTN, CKD III, SLE, and HTN who presented to the ED on 5/18 with complaint of productive cough, lightheadedness with standing. Cardiology was consulted for abnormal stress test results.   Patient states that over the last several days she has developed a thick productive cough with nausea and vomiting. Her daughter became concerned she had pneumonia and told her to come to the ED. Her cough was not associated with fever or chills. Her shortness of breath is at her normal baseline. She denies any chest pain. Patient states she has done well since her stent placement in 2006. However, in the last 6 months she developed intermittent left sided chest pain that can occur with exertion or at rest. Nitroglycerin relieves this pain. It occurs once every 2 weeks or so. She has not seen her Cardiologist for her chest pain. Currently, she is chest pain free and feels well. She admits to lightheadedness upon standing that has been persistent for the last year. Despite holding her lasix and starting florinef during the last admission, she continues to have the same lightheadedness.   Patient was recently admitted from 5/5-5/8 for atypical chest pain with flat minimally elevated troponin trend (non-diagnostic of NSTEMI) likely due to underlying upper respiratory infection (acute bronchitis treated with Levaquin). Patient was treated supportively for URI. Plans were made for outpatient nuclear stress test after discharge and  patient was discharged on ASA 81 mg daily, statin therapy, and low-dose coreg. ACEI was held due to orthostatic hypotension. Outpatient nuclear stress test revealed high risk study based on low ejection fraction. Unable to discern the presence of prior infarct or active ischemia due to technical limitations. There was a large moderate intensity inferior wall defect with mild to moderate reversibility. The left ventricular ejection fraction is severely decreased (EF<30%).  On admission, patient was afebrile, BP 101/65, HR 84, RR 16, satting 100% on room air. Creatinine 1.95 (baseline 1.7), Hgb 10.8 (baseline 10), troponins negative x 3. In the ED, patient was started on rocephin and zithromax given her presumed acute bronchitis.    Past Medical History  Diagnosis Date  . Lymphoma ([place])   . Systemic lupus erythematosus ([place])   . Depression   . Hyperlipidemia   . Sjogren's syndrome ([place])   . Orthostatic hypotension   . Dysphagia   . Syncope   . Anemia   . PMB (postmenopausal bleeding) 06/12/2015  . CKD (chronic kidney disease) stage 3, GFR 30-59 ml/min   . Kidney stones   . Heart murmur     "one dr told me I have one"  . Myocardial infarction ([place]) 2006  . CAD (coronary artery disease)   . COPD (chronic obstructive pulmonary disease) ([place]) dx'd 09/28/2015  . Pneumonia 09/28/2015  . History of blood transfusion 09/2015    "blood was low"  . Rheumatoid arthritis ([place])     "back" (09/28/2015)  . Chronic lower back pain      Past Surgical History  Procedure Laterality Date  . Mass excision  Left ~ 2000    Lymphoma  . Cataract extraction w/ intraocular lens  implant, bilateral Bilateral   . Breast biopsy Left   . Tonsillectomy Left ~ 2000  . Laparoscopic cholecystectomy    . Lymph node dissection Left ~ 2000    "neck"  . Eye surgery    . Retinal detachment surgery Right   . Lithotripsy  X 3  . Coronary angioplasty with stent placement  2006    DES to mid RCA   . Dilation and  curettage of uterus    . Tubal ligation      Allergies  Allergen Reactions  . Florinef [Fludrocortisone Acetate]     Malaise: Discomfort   . Iohexol Other (See Comments)    Pt. States that Tiffany [name] her kidney Dr. Does not want her to receive iv dye due to increased risk of kidney failure.   . Sulfonamide Derivatives Hives  . Tape Rash    ADHESIVE    I have reviewed the patient's current medications . ALPRAZolam  0.5 mg Oral TID  . amitriptyline  100 mg Oral QHS  . aspirin EC  81 mg Oral QHS  . atorvastatin  40 mg Oral q morning - 10a  . cholecalciferol  400 Units Oral QPM  . cycloSPORINE  1 drop Both Eyes BID  . enoxaparin (LOVENOX) injection  30 mg Subcutaneous Q24H  . fludrocortisone  0.1 mg Oral Daily  . folic acid  1 mg Oral q morning - 10a  . hydroxychloroquine  200 mg Oral Daily  . latanoprost  1 drop Both Eyes QHS  . levofloxacin (LEVAQUIN) IV  750 mg Intravenous Q48H  . levothyroxine  25 mcg Oral q morning - 10a  . medroxyPROGESTERone  10 mg Oral Daily  . multivitamin with minerals  1 tablet Oral Daily  . omega-3 acid ethyl esters  1 g Oral Daily  . pantoprazole  40 mg Oral Daily  . potassium chloride  10 mEq Oral QID  . raloxifene  60 mg Oral q morning - 10a  . sodium bicarbonate  650 mg Oral TID   . sodium chloride 1,000 mL (09/29/15 0508)   albuterol, HYDROcodone-acetaminophen  Prior to Admission medications   Medication Sig Start Date End Date Taking? Authorizing Provider  acetaminophen (TYLENOL) 325 MG tablet Take 2 tablets (650 mg total) by mouth every 6 (six) hours as needed for mild pain (or Fever >/= 101). 09/18/15  Yes Tiffany Lis, MD  albuterol (PROVENTIL) (2.5 MG/3ML) 0.083% nebulizer solution Take 3 mLs (2.5 mg total) by nebulization every 6 (six) hours as needed for wheezing or shortness of breath. 09/18/15  Yes Tiffany Lis, MD  ALPRAZolam Duanne Moron) 0.5 MG tablet Take 0.5 mg by mouth 3 (three) times daily.    Yes Historical Provider, MD    amitriptyline (ELAVIL) 100 MG tablet Take 100 mg by mouth daily. 07/21/14  Yes Historical Provider, MD  Artificial Tear Solution (SOOTHE XP OP) Apply 1 drop to eye 3 (three) times daily.    Yes Historical Provider, MD  aspirin EC 81 MG tablet Take 81 mg by mouth at bedtime.    Yes Historical Provider, MD  atorvastatin (LIPITOR) 40 MG tablet Take 1 tablet (40 mg total) by mouth at bedtime. Patient taking differently: Take 40 mg by mouth every morning.  04/21/13  Yes Lendon Colonel, NP  Biotin 5000 MCG CAPS Take 5,000 mcg by mouth daily.   Yes Historical Provider, MD  carvedilol (COREG) 3.125  MG tablet Take 1 tablet (3.125 mg total) by mouth 2 (two) times daily with a meal. 09/18/15  Yes Tiffany Lis, MD  Cholecalciferol (VITAMIN D) 400 UNITS capsule Take 400 Units by mouth every evening.    Yes Historical Provider, MD  clobetasol cream (TEMOVATE) [id number] % Apply 1 application topically 2 (two) times daily.   Yes Historical Provider, MD  cycloSPORINE (RESTASIS) 0.05 % ophthalmic emulsion Place 1 drop into both eyes 2 (two) times daily.   Yes Historical Provider, MD  docusate sodium (COLACE) 100 MG capsule Take 100 mg by mouth 2 (two) times daily as needed for mild constipation.   Yes Historical Provider, MD  fludrocortisone (FLORINEF) 0.1 MG tablet Take 0.1 mg by mouth daily.   Yes Historical Provider, MD  folic acid (FOLVITE) 1 MG tablet Take 1 mg by mouth every morning.    Yes Historical Provider, MD  HYDROcodone-acetaminophen (NORCO/VICODIN) 5-325 MG per tablet Take 1 tablet by mouth 3 (three) times daily as needed for moderate pain.    Yes Historical Provider, MD  hydroxychloroquine (PLAQUENIL) 200 MG tablet Take 200 mg by mouth daily. To be taken with food or milk 02/07/13  Yes Historical Provider, MD  levothyroxine (SYNTHROID, LEVOTHROID) 25 MCG tablet Take 25 mcg by mouth every morning.    Yes Historical Provider, MD  medroxyPROGESTERone (PROVERA) 10 MG tablet Take 1 tablet (10 mg total) by  mouth daily. 06/21/15  Yes Florian Buff, MD  Multiple Vitamin (MULTIVITAMIN) tablet Take 1 tablet by mouth daily.   Yes Historical Provider, MD  Multiple Vitamins-Minerals (PRESERVISION/LUTEIN PO) Take 1 capsule by mouth daily.   Yes Historical Provider, MD  nitroGLYCERIN (NITROSTAT) 0.4 MG SL tablet Place 1 tablet (0.4 mg total) under the tongue every 5 (five) minutes as needed. 05/20/11  Yes Lendon Colonel, NP  Omega-3 Fatty Acids (FISH OIL) 1000 MG CAPS Take 1,000 mg by mouth daily.   Yes Historical Provider, MD  omeprazole (PRILOSEC) 20 MG capsule Take 20 mg by mouth daily.    Yes Historical Provider, MD  potassium chloride (K-DUR,KLOR-CON) 10 MEQ tablet Take 10 mEq by mouth 4 (four) times daily.   Yes Historical Provider, MD  raloxifene (EVISTA) 60 MG tablet Take 60 mg by mouth every morning.    Yes Historical Provider, MD  sodium bicarbonate 325 MG tablet Take 650 mg by mouth 3 (three) times daily.    Yes Historical Provider, MD  Travoprost, BAK Free, (TRAVATAN) 0.004 % SOLN ophthalmic solution Place 1 drop into both eyes at bedtime.   Yes Historical Provider, MD     Social History   Social History  . Marital Status: Divorced    Spouse Name: N/A  . Number of Children: N/A  . Years of Education: N/A   Occupational History  . Disabled and retired    Social History Main Topics  . Smoking status: Former Smoker -- 0.25 packs/day for 23 years    Types: Cigarettes    Start date: 07/14/1986    Quit date: 09/18/2015  . Smokeless tobacco: Never Used  . Alcohol Use: No  . Drug Use: No  . Sexual Activity: No   Other Topics Concern  . Not on file   Social History Narrative    Family Status  Relation Status Death Age  . Paternal Grandfather Deceased   . Paternal Grandmother Deceased   . Maternal Grandmother Deceased   . Maternal Grandfather Deceased   . Father Deceased   . Mother Deceased   .  Brother Alive   . Sister Alive   . Brother Alive   . Brother Deceased   . Brother  Deceased   . Sister Alive   . Daughter Alive   . Daughter Alive   . Son Alive    Family History  Problem Relation Age of Onset  . Coronary artery disease Father   . Coronary artery disease Mother   . Liver disease Mother   . Esophageal cancer Brother   . Breast cancer Sister      ROS:   General: Denies fever, chills, fatigue, change in appetite and diaphoresis.  Respiratory: Admits to chronic SOB, productive cough, DOE. Denies chest tightness, and wheezing.   Cardiovascular: Denies chest pain and palpitations.  Gastrointestinal: Admits to nausea, vomiting (resolved). Denies abdominal pain, diarrhea, constipation Genitourinary: Denies dysuria, urgency, frequency Endocrine: Denies hot or cold intolerance. Musculoskeletal: Denies myalgias, back pain.  Skin: Denies rash and wounds.  Neurological: Admits to lightheadedness with standing (chronic). Denies headaches. Psychiatric/Behavioral: Denies mood changes, confusion, nervousness, sleep disturbance and agitation.   Physical Exam: General: Vital signs reviewed.  Patient is thin appearing, elderly, in no acute distress and cooperative with exam.  Neck: No JVD, or carotid bruit present.  Cardiovascular: RRR, S1 normal, S2 normal, no murmurs, gallops, or rubs. Pulmonary/Chest: Scattered rhonchi, clears with cough, no wheezes, rales. Abdominal: Soft, non-tender, non-distended, BS +  Extremities: No lower extremity edema bilaterally, pulses symmetric and intact bilaterally. Neurological: Awake, alert, answers questions appropriately. Skin: Warm, dry. Psychiatric: Normal mood and affect. speech and behavior is normal. Cognition and memory are grossly normal.   Labs:   Lab Results  Component Value Date   WBC 6.3 09/29/2015   HGB 9.5* 09/29/2015   HCT 30.7* 09/29/2015   MCV 88.2 09/29/2015   PLT 224 09/29/2015    Recent Labs  09/28/15 1249  INR 1.19     Recent Labs Lab 09/29/15 0308  NA 140  K 3.4*  CL 113*  CO2 18*    BUN 23*  CREATININE 1.82*  CALCIUM 8.5*  PROT 6.1*  BILITOT 0.5  ALKPHOS 52  ALT 14  AST 14*  GLUCOSE 82  ALBUMIN 2.0*   MAGNESIUM  Date Value Ref Range Status  11/03/2014 2.1 1.7 - 2.4 mg/dL Final    Recent Labs  09/28/15 2050 09/29/15 0308 09/29/15 0801  TROPONINI 0.03 0.03 0.04*    Recent Labs  09/28/15 1249 09/28/15 1550  TROPIPOC 0.04 0.03   B NATRIURETIC PEPTIDE  Date/Time Value Ref Range Status  09/16/2015 11:15 AM 187.6* 0.0 - 100.0 pg/mL Final  07/13/2014 04:39 PM 103.0* 0.0 - 100.0 pg/mL Final   Lab Results  Component Value Date   CHOL 152 05/15/2011   HDL 58 05/15/2011   LDLCALC 82 05/15/2011   TRIG 61 05/15/2011   Lab Results  Component Value Date   DDIMER 1.36* 09/15/2015   LIPASE  Date/Time Value Ref Range Status  06/13/2012 07:06 PM 28 11 - 59 U/L Final   TSH  Date/Time Value Ref Range Status  07/17/2010 05:29 PM 4.193 0.350-4.500 microintl units/mL Final   VITAMIN B-12  Date/Time Value Ref Range Status  09/17/2015 08:59 AM 1291* 180 - 914 pg/mL Final    Comment:    (NOTE) This assay is not validated for testing neonatal or myeloproliferative syndrome specimens for Vitamin B12 levels.    FOLATE  Date/Time Value Ref Range Status  09/17/2015 12:29 PM >80.0 >5.9 ng/mL Final    Comment:    RESULTS  CONFIRMED BY MANUAL DILUTION   FERRITIN  Date/Time Value Ref Range Status  09/17/2015 08:59 AM 145 11 - 307 ng/mL Final   TIBC  Date/Time Value Ref Range Status  09/17/2015 08:59 AM 151* 250 - 450 ug/dL Final   IRON  Date/Time Value Ref Range Status  09/17/2015 08:59 AM 13* 28 - 170 ug/dL Final   RETIC CT PCT  Date/Time Value Ref Range Status  09/17/2015 08:59 AM 0.9 0.4 - 3.1 % Final    Echo 5/6:  Study Conclusions  - Left ventricle: The cavity size was mildly dilated. Wall  thickness was normal. Systolic function was moderately to  severely reduced. The estimated ejection fraction was in the  range of 30% to 35%.  Diffuse hypokinesis. Doppler parameters are  consistent with abnormal left ventricular relaxation (grade 1  diastolic dysfunction). - Mitral valve: Calcified annulus. There was moderate  regurgitation. - Left atrium: The atrium was mildly dilated.  Impressions:  - Moderate to severe global reduction in LV function; grade 1  diastolic dysfunction; mild LVE; mild LAE; moderate MR.  ECG:    Myoview 5/15:  There was no ST segment deviation noted during stress.  This is a high risk study. High risk study based on low ejection fraction, consider correlating findings with echo. Unable to discern the presence of prior infarct or active ischemia due to technical limitations described below  Technically difficult study. There is severe radiotracer uptake in the gut that engulfs the inferior wall in both the resting and post-stress images and also inhibits wall motion assessment of the inferior wall. There is large moderate intensity inferior wall defect with mild to moderate reversibility. Cannot distinguish the extent that is true defect verse artifact.  The left ventricular ejection fraction is severely decreased (<30%).  Her stress test is abnormal due to low EF which was found previously on ECHO during her admission. Also possible defect vs artifact.   Radiology:  Dg Chest 2 View  09/28/2015  CLINICAL DATA:  Weakness and dizziness with hypotension EXAM: CHEST  2 VIEW COMPARISON:  Sep 17, 2015 FINDINGS: There is mild bibasilar atelectatic change. There is stable bilateral apical pleural thickening. There is no frank edema or consolidation. Heart size and pulmonary vascularity are normal. No adenopathy. There is degenerative change in thoracic spine. Bones appear osteoporotic. IMPRESSION: Patchy bibasilar atelectasis. Stable apical pleural thickening bilaterally. No edema or consolidation. Stable cardiac silhouette. Electronically Signed   By: Lowella Grip III M.D.   On: 09/28/2015 13:24     ASSESSMENT AND PLAN:   Principal Problem:   Acute bronchitis with COPD ([place]) Active Problems:   Orthostatic hypotension   Sjogren's syndrome ([facility])   Hyperlipidemia   Anemia   CKD (chronic kidney disease), stage IV ([facility])   Systemic lupus erythematosus ([place])   Coronary artery disease   Recent NSTEMI (non-ST elevated myocardial infarction) [facility])  Ms. Ofori is a 75 yo female with PMHx of CAD s/p PCI of RCA in 2006 (follow up cath 2007 showed moderate nonobstructive coronary disease and patent stent in the right coronary), Chronic Combined CHF, orthostatic hypotension, Sjogren's Syndrome, CKD III, SLE, and HTN who presented to the ED on 5/18 with complaint of productive cough, lightheadedness with standing. Cardiology was consulted for abnormal stress test results.   CAD s/p PCI to RCA in 2006: Follow up cath 2007 showed moderate nonobstructive coronary disease and patent stent in the right coronary. Recent admit for atypical chest pain for flat minimally elevated troponins (0.04>0.06>0.04)  due to underlying pulmonary disease and not diagnostic of NSTEMI. Plans were made for outpatient nuclear stress which revealed high risk study based on low ejection fraction. Unable to discern the presence of prior infarct or active ischemia due to technical limitations. There was a large moderate intensity inferior wall defect with mild to moderate reversibility. The left ventricular ejection fraction is severely decreased (EF<30%). This admission, troponins have been negative x 3. No acute ischemic changes on EKG or complaints of chest pain. Patient denies any active chest pain, but does admit to a 6 month history of intermittent left sided chest pain which is relieved by nitroglycerin. Given new development of intermittent chest pain, worsening EF, and high risk study with history of CAD s/p PCI it would be reasonable for re-evaluation of coronaries with outpatient cath. I do not feel her current symptoms of  lightheadedness and shortness of breath are due to active ischemia.  -ASA 81 mg daily -Atorvastatin 40 mg daily -Agree with holding BB and lasix while orthostatic -If OH improves, consider trial of bisoprolol and/or Imdur -Okay to discharge patient home from cardiac standpoint with follow up with scheduled appointment with NP on 10/02/15 -Will consider outpatient cardiac cath for re-evaluation of coronaries given new intermittent CP in last 6 months and worsening EF once patient has cleared current illness -The above was discussed with Dr. Ellyn Hack who agrees with the plan  Chronic Combined CHF: Echo on 5/5 showed 30% to 35%, diffuse hypokinesis, and grade 1 diastolic dysfunction. Myoview 5/15 also showed EF of 30%. Lasix has been held since previous admission due to orthostatic hypotension. -Agree with holding BB and lasix while orthostatic -If OH improves, consider trial of bisoprolol and/or Imdur  Chronic Orthostatic Hypotension: Noted on previously admission. Patient states her symptoms have been persistent for the past one year. ACEI held, but started on low dose Coreg and Florinef last admission. Patient continues to be lightheaded on standing without any noticeable change. Orthostatics not yet assessed this admission. -Check orthostatic vital signs -Holding BB and Lasix  -Florinef 0.1 mg daily  HTN: 100-110s/50-60s since admission.  -Holding BB, and Lasix  CKD III: Baseline creatinine about 1.7. Stable at 1.8 on admission.  -Monitor  Acute Bronchitis with COPD: On Levaquin. -Per primary  Signed: Martyn Malay, DO PGY-2 Internal Medicine Resident Pager # [phone] 09/29/2015 2:29 PM   I have seen, examined and evaluated the patient this PM along with Dr. Quay Burow (R2).  After reviewing all the available data and chart, we discussed the patients laboratory, study & physical findings as well as symptoms in detail. I agree with her findings, examination as well as impression  recommendations as per our discussion.    Very pleasant, frail woman who appears older than her stated age. She has a history of known coronary disease status post PCI and then patent stent on relook. She has had an abnormal echocardiogram in the past, and is now in the hospital currently for what sounds like bronchitis type symptoms. We are consulted because following a recent hospitalization she had a repeat nuclear stress test showing abnormal findings. I personally reviewed the images, and honestly cannot tell anything from these images based on the quality. There is some suggestion of a possible inferior defect, however he cannot make an assumption is any ischemia or not. She is not currently having any of her chest discomfort either with rest or exertion. Therefore I don't see any acute need for further evaluation during this hospitalization.  She does  have some episodes of exertional chest discomfort, but is not consistent, does not happen on a routine basis. It can happen once or twice a week and not necessarily with the same amount of exertion. Spaces somewhat atypical in nature, and I don't think is an acute issue that needs to be addressed while she is in the hospital bronchitis.  He has an appointment to follow-up with Jory Sims, NP next week on Monday morning. My recommendation would be to obtain you treating her respiratory tract infection and discharge 1. I would like for her to feel symptomatically better after recovering from this prior to deciding with or not she would need to have a cardiac catheterization. Unfortunately based on the poor quality of the stress test, I don't think it really helps. She still has symptoms, we may need to consider either continued medical management versus cardiac catheterization to exclude new ischemia. This however I think can be done as an outpatient and not as an inpatient.  The issue with management of her coronary disease based on hypotension  (especially orthostatic hypotension). Would potentially consider switching beta blocker to bisoprolol from carvedilol as this is less of a hypotensive effect. Could also consider Imdur. Agree with holding her ACE inhibitor and diuretic, especially with her being on Florinef  Given the difficulties of medical management etc., I think this is best managed in the outpatient setting when she is feeling better. We are able to potentially adjust medications and see how she does, we can then determine whether or not she needs to have an invasive study.  We will sign off for now.   Glenetta Hew, M.D., M.S. Interventional Cardiologist   Pager # [phone] Phone # [phone] [address] [place], [place] [address]

## 2015-09-30 DIAGNOSIS — I25118 Atherosclerotic heart disease of native coronary artery with other forms of angina pectoris: Secondary | ICD-10-CM

## 2015-09-30 DIAGNOSIS — J189 Pneumonia, unspecified organism: Secondary | ICD-10-CM | POA: Diagnosis not present

## 2015-09-30 DIAGNOSIS — N184 Chronic kidney disease, stage 4 (severe): Secondary | ICD-10-CM | POA: Diagnosis not present

## 2015-09-30 DIAGNOSIS — J440 Chronic obstructive pulmonary disease with acute lower respiratory infection: Secondary | ICD-10-CM | POA: Diagnosis not present

## 2015-09-30 DIAGNOSIS — I214 Non-ST elevation (NSTEMI) myocardial infarction: Secondary | ICD-10-CM

## 2015-09-30 DIAGNOSIS — D649 Anemia, unspecified: Secondary | ICD-10-CM

## 2015-09-30 LAB — CBC
HCT: 30 % — ABNORMAL LOW (ref 36.0–46.0)
Hemoglobin: 9.4 g/dL — ABNORMAL LOW (ref 12.0–15.0)
MCH: 27.4 pg (ref 26.0–34.0)
MCHC: 31.3 g/dL (ref 30.0–36.0)
MCV: 87.5 fL (ref 78.0–100.0)
PLATELETS: 219 10*3/uL (ref 150–400)
RBC: 3.43 MIL/uL — ABNORMAL LOW (ref 3.87–5.11)
RDW: 16.1 % — AB (ref 11.5–15.5)
WBC: 5.3 10*3/uL (ref 4.0–10.5)

## 2015-09-30 LAB — BASIC METABOLIC PANEL
ANION GAP: 13 (ref 5–15)
BUN: 20 mg/dL (ref 6–20)
CALCIUM: 8.5 mg/dL — AB (ref 8.9–10.3)
CO2: 16 mmol/L — AB (ref 22–32)
CREATININE: 1.68 mg/dL — AB (ref 0.44–1.00)
Chloride: 114 mmol/L — ABNORMAL HIGH (ref 101–111)
GFR calc Af Amer: 34 mL/min — ABNORMAL LOW (ref >60)
GFR, EST NON AFRICAN AMERICAN: 29 mL/min — AB (ref >60)
GLUCOSE: 78 mg/dL (ref 65–99)
Potassium: 3.7 mmol/L (ref 3.5–5.1)
Sodium: 143 mmol/L (ref 135–145)

## 2015-09-30 LAB — TSH: TSH: 3.797 u[IU]/mL (ref 0.350–4.500)

## 2015-09-30 MED ORDER — LEVOFLOXACIN 750 MG PO TABS
750.0000 mg | ORAL_TABLET | ORAL | Status: DC
Start: 2015-09-30 — End: 2015-11-03

## 2015-09-30 NOTE — Discharge Instructions (Signed)

## 2015-09-30 NOTE — Discharge Summary (Signed)
Physician Discharge Summary  Tiffany [name] [id number] DOB: [birth year] DOA: 09/28/2015  PCP: Cleda Mccreedy, MD  Admit date: 09/28/2015 Discharge date: 09/30/2015  Time spent: 45 minutes  Recommendations for Outpatient Follow-up:  Patient will be discharged to home with home health PT.  Patient will need to follow up with primary care provider within one week of discharge.  Follow up with Dr. Domenic Polite, cardiologist, at scheduled time. Patient should continue medications as prescribed.  Patient should follow a heart healthy diet.   Discharge Diagnoses:  Principal Problem:   Acute bronchitis with COPD ([place]) Active Problems:   Orthostatic hypotension   Sjogren's syndrome (HCC)   Hyperlipidemia   Anemia   CKD (chronic kidney disease), stage IV (HCC)   Systemic lupus erythematosus ([place])   Coronary artery disease   Recent NSTEMI (non-ST elevated myocardial infarction) [facility])   Discharge Condition: Stable  Diet recommendation: Heart healthy  Filed Weights   09/28/15 1837  Weight: 55.52 kg (122 lb 6.4 oz)    History of present illness:  On 09/28/2015 by Ms. Erin Hearing, NP [name] is a 75 y.o. female with medical history significant for Sjogren's syndrome syndrome, lupus, chronic kidney disease, hypertension, anemia, chronic systolic heart failure, history of CAD with PCI of RCA in 2006 with follow-up catheterization 2007 moderate nonobstructive coronary disease and the stent of the right coronary. She was recently admitted for orthostatic hypotension and non-STEMI. Her Lasix was stopped, cardiology evaluated her and recommended outpatient stress test and start her on very low-dose carvedilol. Although she did not have any signs of frank bleeding her hemoglobin dropped during the hospitalization he was given 2 units of packed red blood cells and her aspirin was decreased from 325 mg to 81 mg daily. She is also started on Florinef for her orthostatic hypotension or in the  previous admission.  She returns to the ER today complaining of productive cough with sputum so thick she is unable to cough it all the way up so she saw's it. She denies fevers or chills. She is still dizzy with standing. She states she underwent her stress test on 5/15 in reasonable but still has an appointment pending to meet with the cardiologist to discuss the results. She has not had any other infectious type symptoms such as urinary tract infection, nausea vomiting abdominal pain or diarrhea.  Hospital Course:  Acute bronchitis with COPD  -Patient reports cough with thick sputum she is unable to expectorate and ongoing tobacco abuse although she quit several days ago -Continue nebs, levaquin, pulmonary toilet -CXR: No edema or consolidation  Orthostatic hypotension -Last admission Lasix was discontinued and patient was started on Florinef -Continues to report dizziness, lasix and coreg held  -PT consulted, patient ambulated with no problems.  O2 sats remained in the 90s, no complaints of dizziness or shortness of breath  Coronary artery disease/Recent NSTEMI (non-ST elevated myocardial infarction) -No reports of chest pain since arrival and enzymes 2 have been negative but we will continue to cycle -Patient underwent stress test on 5/15, high risk -Cardiology consulted and appreciated- recommended outpatient follow up and possible medical management and resolution of bronchitis before deciding on cardiac cath. Would consider switching coreg to bisoprolol or imdur -Question if patient's dizziness and fatigue may be cardiac realted -Follows with Dr. Domenic Polite- appointment scheduled for 10/02/2015  Hyperlipidemia -Continue statin and omega-3 3 fatty acids  Anemia secondary to chronic disease -Hemoglobin currently 9.4 -baseline 9-11 -Patient required blood transfusion during last admission  CKD (  chronic kidney disease), stage IV  -Renal function stable and close to  basline -Currently creatinine 1.68  Sjogren's syndrome/Systemic lupus erythematosus  -Continue home medications: Restasis, Plaquenil  Hypothyroidism -Continue Synthroid -TSH 3.797  Consultants Cardiology  Procedures  None  Discharge Exam: Filed Vitals:   09/29/15 2142 09/30/15 0604  BP: 139/61 116/63  Pulse: 76 77  Temp: 97.8 F (36.6 C) 97.9 F (36.6 C)  Resp: 16 16    Exam  General: Well developed, well nourished, NAD, appears stated age  HEENT: NCAT, mucous membranes moist.   Cardiovascular: S1 S2 auscultated, RRR, no murmurs.  Respiratory: Slighly diminished but clear, no wheezing  Abdomen: Soft, nontender, nondistended, + bowel sounds  Extremities: warm dry without cyanosis clubbing or edema  Neuro: AAOx3, nonfocal  Psych: Normal affect and demeanor, pleasant  Discharge Instructions      Discharge Instructions    Discharge instructions    Complete by:  As directed   Patient will be discharged to home with home health PT.  Patient will need to follow up with primary care provider within one week of discharge.  Follow up with Dr. Domenic Polite, cardiologist, at scheduled time. Patient should continue medications as prescribed.  Patient should follow a heart healthy diet.            Medication List    TAKE these medications        acetaminophen 325 MG tablet  Commonly known as:  TYLENOL  Take 2 tablets (650 mg total) by mouth every 6 (six) hours as needed for mild pain (or Fever >/= 101).     albuterol (2.5 MG/3ML) 0.083% nebulizer solution  Commonly known as:  PROVENTIL  Take 3 mLs (2.5 mg total) by nebulization every 6 (six) hours as needed for wheezing or shortness of breath.     ALPRAZolam 0.5 MG tablet  Commonly known as:  XANAX  Take 0.5 mg by mouth 3 (three) times daily.     amitriptyline 100 MG tablet  Commonly known as:  ELAVIL  Take 100 mg by mouth daily.     aspirin EC 81 MG tablet  Take 81 mg by mouth at bedtime.      atorvastatin 40 MG tablet  Commonly known as:  LIPITOR  Take 1 tablet (40 mg total) by mouth at bedtime.     Biotin 5000 MCG Caps  Take 5,000 mcg by mouth daily.     carvedilol 3.125 MG tablet  Commonly known as:  COREG  Take 1 tablet (3.125 mg total) by mouth 2 (two) times daily with a meal.     clobetasol cream 0.05 %  Commonly known as:  TEMOVATE  Apply 1 application topically 2 (two) times daily.     cycloSPORINE 0.05 % ophthalmic emulsion  Commonly known as:  RESTASIS  Place 1 drop into both eyes 2 (two) times daily.     docusate sodium 100 MG capsule  Commonly known as:  COLACE  Take 100 mg by mouth 2 (two) times daily as needed for mild constipation.     Fish Oil 1000 MG Caps  Take 1,000 mg by mouth daily.     fludrocortisone 0.1 MG tablet  Commonly known as:  FLORINEF  Take 0.1 mg by mouth daily.     folic acid 1 MG tablet  Commonly known as:  FOLVITE  Take 1 mg by mouth every morning.     HYDROcodone-acetaminophen 5-325 MG tablet  Commonly known as:  NORCO/VICODIN  Take 1 tablet by  mouth 3 (three) times daily as needed for moderate pain.     hydroxychloroquine 200 MG tablet  Commonly known as:  PLAQUENIL  Take 200 mg by mouth daily. To be taken with food or milk     levofloxacin 750 MG tablet  Commonly known as:  LEVAQUIN  Take 1 tablet (750 mg total) by mouth every other day.     levothyroxine 25 MCG tablet  Commonly known as:  SYNTHROID, LEVOTHROID  Take 25 mcg by mouth every morning.     medroxyPROGESTERone 10 MG tablet  Commonly known as:  PROVERA  Take 1 tablet (10 mg total) by mouth daily.     multivitamin tablet  Take 1 tablet by mouth daily.     nitroGLYCERIN 0.4 MG SL tablet  Commonly known as:  NITROSTAT  Place 1 tablet (0.4 mg total) under the tongue every 5 (five) minutes as needed.     omeprazole 20 MG capsule  Commonly known as:  PRILOSEC  Take 20 mg by mouth daily.     potassium chloride 10 MEQ tablet  Commonly known as:   K-DUR,KLOR-CON  Take 10 mEq by mouth 4 (four) times daily.     PRESERVISION/LUTEIN PO  Take 1 capsule by mouth daily.     raloxifene 60 MG tablet  Commonly known as:  EVISTA  Take 60 mg by mouth every morning.     sodium bicarbonate 325 MG tablet  Take 650 mg by mouth 3 (three) times daily.     SOOTHE XP OP  Apply 1 drop to eye 3 (three) times daily.     Travoprost (BAK Free) 0.004 % Soln ophthalmic solution  Commonly known as:  TRAVATAN  Place 1 drop into both eyes at bedtime.     Vitamin D 400 units capsule  Take 400 Units by mouth every evening.       Allergies  Allergen Reactions  . Florinef [Fludrocortisone Acetate]     Malaise: Discomfort   . Iohexol Other (See Comments)    Pt. States that Dr. Florene Glen her kidney Dr. Does not want her to receive iv dye due to increased risk of kidney failure.   . Sulfonamide Derivatives Hives  . Tape Rash    ADHESIVE   Follow-up Information    Follow up with Cleda Mccreedy, MD. Schedule an appointment as soon as possible for a visit in 1 week.   Specialty:  Internal Medicine   Why:  Hospital follow up   Contact information:   [place] [address] [phone]       Follow up with Rozann Lesches, MD On 10/02/2015.   Specialty:  Cardiology   Contact information:   [place] [address] [phone]        The results of significant diagnostics from this hospitalization (including imaging, microbiology, ancillary and laboratory) are listed below for reference.    Significant Diagnostic Studies: Dg Chest 2 View  09/28/2015  CLINICAL DATA:  Weakness and dizziness with hypotension EXAM: CHEST  2 VIEW COMPARISON:  Sep 17, 2015 FINDINGS: There is mild bibasilar atelectatic change. There is stable bilateral apical pleural thickening. There is no frank edema or consolidation. Heart size and pulmonary vascularity are normal. No adenopathy. There is degenerative change in thoracic spine. Bones  appear osteoporotic. IMPRESSION: Patchy bibasilar atelectasis. Stable apical pleural thickening bilaterally. No edema or consolidation. Stable cardiac silhouette. Electronically Signed   By: Lowella Grip III M.D.   On: 09/28/2015  13:24   Dg Chest 2 View  09/15/2015  CLINICAL DATA:  Chest pain, weakness for 2 weeks, low blood pressure EXAM: CHEST  2 VIEW COMPARISON:  11/15/2014 FINDINGS: Cardiomediastinal silhouette is stable. Mild hyperinflation. No infiltrate or pleural effusion. No pulmonary edema. Osteopenia and mild degenerative changes thoracic spine. IMPRESSION: No active cardiopulmonary disease. Electronically Signed   By: Lahoma Crocker M.D.   On: 09/15/2015 16:01   Nm Myocar Multi W/spect W/wall Motion / Ef  09/25/2015   There was no ST segment deviation noted during stress.  This is a high risk study. High risk study based on low ejection fraction, consider correlating findings with echo. Unable to discern the presence of prior infarct or active ischemia due to technical limitations described below  Technically difficult study. There is severe radiotracer uptake in the gut that engulfs the inferior wall in both the resting and post-stress images and also inhibits wall motion assessment of the inferior wall. There is large moderate intensity inferior wall defect with mild to moderate reversibility. Cannot distinguish the extent that is true defect verse artifact.  The left ventricular ejection fraction is severely decreased (<30%).    Dg Chest Port 1 View  09/17/2015  CLINICAL DATA:  Productive cough with fever 2 days ago. No chest pain or shortness of breath. EXAM: PORTABLE CHEST 1 VIEW COMPARISON:  Sep 15, 2015 FINDINGS: Biapical pleural parenchymal thickening is stable, slightly greater on the left than the right, likely scarring. No pneumothorax is identified on today's study. There is minimal opacity in the right base not seen yesterday, probably atelectasis. Minimal atelectasis is seen in  the left base as well. A skin fold is seen over the left lateral chest. No convincing evidence of pneumothorax. The heart, hila, and mediastinum are unchanged and unremarkable. No overt edema. IMPRESSION: Mild bibasilar opacities, likely atelectasis. No other acute abnormalities. Electronically Signed   By: Dorise Bullion III M.D   On: 09/17/2015 09:27   Ct Renal Stone Study  09/08/2015  CLINICAL DATA:  Hematuria, burning urination starting this morning, flank pain EXAM: CT ABDOMEN AND PELVIS WITHOUT CONTRAST TECHNIQUE: Multidetector CT imaging of the abdomen and pelvis was performed following the standard protocol without IV contrast. COMPARISON:  Renal ultrasound 4/ 24/17 FINDINGS: Lower chest: Lung bases shows streaky atelectasis or scarring in left lower lobe posteriorly. Mild emphysematous changes. Small hiatal hernia. Hepatobiliary: Unenhanced liver shows no biliary ductal dilatation. The patient is status post cholecystectomy. No CBD dilatation. Pancreas: Unenhanced pancreas is mild atrophic. Spleen: Unenhanced spleen is unremarkable. Adrenals/Urinary Tract: Unenhanced adrenal glands are unremarkable. Multiple bilateral nonobstructive renal calculi are noted. The largest calculus in lower pole of the right kidney measures 5.3 mm. Largest calculus in lower pole of the left kidney measures 5.6 mm. Largest nonobstructive calculus in upper pole of the right kidney measures 4 mm. Mild dilatation of bilateral extrarenal pelvis. No hydronephrosis or hydroureter. No calcified ureteral calculi are noted bilaterally. At least 3 or 4 calcified calculi are noted within posterior aspect of the urinary bladder the largest measures 3.3 mm. Bilateral mild lobulated renal contour. Mild bilateral cortical thinning probable due to atrophy. Again noted exophytic cyst in lower pole of the left kidney measures 8 mm. Stomach/Bowel: There is no gastric outlet obstruction. No small bowel obstruction. No thickened or dilated small  bowel loops. There is a low lying cecum. No pericecal inflammation. Abundant stool noted within cecum and right colon. Abundant stool noted within redundant transverse colon. Moderate stool and gas  noted within descending colon. The appendix is not identified. No distal colonic obstruction. Vascular/Lymphatic: Atherosclerotic calcifications of abdominal aorta and iliac arteries are noted. Reproductive: The uterus is atrophic. No adnexal masses noted. Small calcified adnexal vessels. Other: There is no ascites or free air. Musculoskeletal: No destructive bony lesions are noted within pelvis. Sagittal images of the spine shows osteopenia and degenerative changes lumbar spine. Significant disc space flattening with vacuum disc phenomenon and endplate sclerotic changes at L1-L2 and L5-S1 level. IMPRESSION: 1. There is bilateral nonobstructive nephrolithiasis. No hydronephrosis or hydroureter. No calcified ureteral calculi. Bilateral mild prominent extrarenal pelvis. 2. Again noted exophytic cyst in lower pole of the left kidney measures 8 mm. 3. Small calcified calculi are noted within posterior aspect of the urinary bladder the largest measures 3.3 mm. 4. There is a low lying cecum. No pericecal inflammation. The appendix is not identified. Abundant stool noted in right colon and transverse colon. There is redundant transverse colon. No distal colonic obstruction. 5. No small bowel obstruction. 6. Degenerative changes lumbar spine. 7. Small hiatal hernia. Electronically Signed   By: Lahoma Crocker M.D.   On: 09/08/2015 16:55    Microbiology: Recent Results (from the past 240 hour(s))  Blood culture (routine x 2)     Status: None (Preliminary result)   Collection Time: 09/28/15  2:17 PM  Result Value Ref Range Status   Specimen Description BLOOD RIGHT HAND  Final   Special Requests BOTTLES DRAWN AEROBIC AND ANAEROBIC 5CC  Final   Culture NO GROWTH 2 DAYS  Final   Report Status PENDING  Incomplete  Blood culture  (routine x 2)     Status: None (Preliminary result)   Collection Time: 09/28/15  2:20 PM  Result Value Ref Range Status   Specimen Description BLOOD LEFT ANTECUBITAL  Final   Special Requests BOTTLES DRAWN AEROBIC AND ANAEROBIC 5CC  Final   Culture NO GROWTH 2 DAYS  Final   Report Status PENDING  Incomplete     Labs: Basic Metabolic Panel:  Recent Labs Lab 09/28/15 1249 09/29/15 0308 09/30/15 0703  NA 137 140 143  K 3.7 3.4* 3.7  CL 110 113* 114*  CO2 18* 18* 16*  GLUCOSE 86 82 78  BUN 26* 23* 20  CREATININE 1.95* 1.82* 1.68*  CALCIUM 8.9 8.5* 8.5*   Liver Function Tests:  Recent Labs Lab 09/29/15 0308  AST 14*  ALT 14  ALKPHOS 52  BILITOT 0.5  PROT 6.1*  ALBUMIN 2.0*   No results for input(s): LIPASE, AMYLASE in the last 168 hours. No results for input(s): AMMONIA in the last 168 hours. CBC:  Recent Labs Lab 09/28/15 1249 09/29/15 0308 09/30/15 0703  WBC 9.0 6.3 5.3  NEUTROABS 6.4  --   --   HGB 10.8* 9.5* 9.4*  HCT 34.1* 30.7* 30.0*  MCV 87.9 88.2 87.5  PLT 223 224 219   Cardiac Enzymes:  Recent Labs Lab 09/28/15 2050 09/29/15 0308 09/29/15 0801  TROPONINI 0.03 0.03 0.04*   BNP: BNP (last 3 results)  Recent Labs  09/16/15 1115  BNP 187.6*    ProBNP (last 3 results) No results for input(s): PROBNP in the last 8760 hours.  CBG: No results for input(s): GLUCAP in the last 168 hours.     SignedSKY, MINIHAN  [facility] 09/30/2015, 12:42 PM

## 2015-09-30 NOTE — Evaluation (Signed)
Physical Therapy Evaluation Patient Details Name: Tiffany [name] MRN: [id number] DOB: 01-Dec-1940 Today's Date: 09/30/2015   History of Present Illness  Pt is a 75 y/o F who presented w/ cough w/ sputum as dizziness with standing.  Admitting dx: acute bronchitis w/ COPD.  Pt's PMH includes lymphoma, lupus, depression, sjogren's syndrome, anemia, syncope, orthostatic hypotension, COPD, CKD, neck surgery.    Clinical Impression  Pt admitted with above diagnosis. Pt currently with functional limitations due to the deficits listed below (see PT Problem List). Tiffany [name] presents w/ impaired balance.  She currently requires close min guard assist for safe ambulation and min assist for sit<>stand transfers.  Pt educated on safe use of RW.  She has 24/7 assist/supervision available at home. Pt will benefit from skilled PT to increase their independence and safety with mobility to allow discharge to the venue listed below.      Follow Up Recommendations Home health PT;Supervision for mobility/OOB    Equipment Recommendations  None recommended by PT    Recommendations for Other Services OT consult     Precautions / Restrictions Precautions Precautions: Fall Restrictions Weight Bearing Restrictions: No      Mobility  Bed Mobility Overal bed mobility: Needs Assistance Bed Mobility: Supine to Sit;Sit to Supine     Supine to sit: Supervision;HOB elevated Sit to supine: Supervision   General bed mobility comments: Supervision for safety due to h/o orthostatic hypotension. Pt denies dizziness  Transfers Overall transfer level: Needs assistance Equipment used: Rolling walker (2 wheeled) Transfers: Sit to/from Stand Sit to Stand: Min assist         General transfer comment: Increased effort and pt rocks for momentum.  Cues when returning to bed to keep RW close.    Ambulation/Gait Ambulation/Gait assistance: Min guard Ambulation Distance (Feet): 100 Feet Assistive device: Rolling  walker (2 wheeled) Gait Pattern/deviations: Step-through pattern;Decreased stride length;Trunk flexed;Staggering right;Staggering left   Gait velocity interpretation: Below normal speed for age/gender General Gait Details: Staggers slightly Lt/Rt w/ level surface ambulation.  Able to steady w/o physical assist by relying on RW.  SpO2 94-98% while ambulating.  Stairs            Wheelchair Mobility    Modified Rankin (Stroke Patients Only)       Balance Overall balance assessment: Needs assistance;History of Falls Sitting-balance support: Feet supported;No upper extremity supported Sitting balance-Leahy Scale: Good     Standing balance support: Bilateral upper extremity supported;During functional activity Standing balance-Leahy Scale: Poor Standing balance comment: RW for support                             Pertinent Vitals/Pain Pain Assessment: No/denies pain    Home Living Family/patient expects to be discharged to:: Private residence Living Arrangements: Other relatives (granddaughter and granddaughter's husband) Available Help at Discharge: Family;Available 24 hours/day (granddaughter) Type of Home: House Home Access: Stairs to enter Entrance Stairs-Rails: None Entrance Stairs-Number of Steps: 2 Home Layout: One level Home Equipment: Walker - 2 wheels;Cane - single point;Bedside commode;Wheelchair - manual      Prior Function Level of Independence: Needs assistance   Gait / Transfers Assistance Needed: Uses RW at all times.  Granddaughter pushes her in the [facility] when they go out of the house to go shopping.  ADL's / Homemaking Assistance Needed: Needs assist reaching back and feet w/ bathing (from grandduaghter)  Comments: Last fall was during last hospital stay (~2-3 wks ago).  Pt attempted to get to [facility] w/o assist and the [facility] tipped sideways when she was holding onto the armrest.     Hand Dominance   Dominant Hand: Right    Extremity/Trunk  Assessment   Upper Extremity Assessment: Overall WFL for tasks assessed           Lower Extremity Assessment: Overall WFL for tasks assessed      Cervical / Trunk Assessment: Kyphotic  Communication   Communication: No difficulties  Cognition Arousal/Alertness: Awake/alert Behavior During Therapy: WFL for tasks assessed/performed Overall Cognitive Status: Within Functional Limits for tasks assessed                      General Comments General comments (skin integrity, edema, etc.): Pt denied dizziness throughout session    Exercises Other Exercises Other Exercises: Pt encouraged to ambulate 3x/day w/ nursing staff      Assessment/Plan    PT Assessment Patient needs continued PT services  PT Diagnosis Difficulty walking;Generalized weakness   PT Problem List Decreased strength;Decreased activity tolerance;Decreased mobility;Decreased balance;Decreased knowledge of use of DME;Decreased safety awareness  PT Treatment Interventions DME instruction;Gait training;Stair training;Functional mobility training;Therapeutic activities;Therapeutic exercise;Balance training;Patient/family education   PT Goals (Current goals can be found in the Care Plan section) Acute Rehab PT Goals Patient Stated Goal: to go home PT Goal Formulation: With patient Time For Goal Achievement: 10/14/15 Potential to Achieve Goals: Good    Frequency Min 3X/week   Barriers to discharge        Co-evaluation               End of Session Equipment Utilized During Treatment: Gait belt;Oxygen Activity Tolerance: Patient tolerated treatment well;Patient limited by fatigue Patient left: in bed;with call bell/phone within reach;with bed alarm set;Other (comment) (MD at bedside) Nurse Communication: Mobility status;Other (comment) (SpO2)         Time: [id number] PT Time Calculation (min) (ACUTE ONLY): 17 min   Charges:   PT Evaluation $PT Eval Low Complexity: 1 Procedure     PT G  Codes:       Tiffany [name] PT, DPT  Pager: [phone] Phone: [phone] 09/30/2015, 9:42 AM

## 2015-09-30 NOTE — Progress Notes (Signed)
Nsg Discharge Note  Admit Date:  09/28/2015 Discharge date: 09/30/2015   [name] to be D/C'd Home per MD order.  AVS completed.  Copy for chart, and copy for patient signed, and dated. Patient/caregiver able to verbalize understanding.  Discharge Medication:   Medication List    TAKE these medications        acetaminophen 325 MG tablet  Commonly known as:  TYLENOL  Take 2 tablets (650 mg total) by mouth every 6 (six) hours as needed for mild pain (or Fever >/= 101).     albuterol (2.5 MG/3ML) 0.083% nebulizer solution  Commonly known as:  PROVENTIL  Take 3 mLs (2.5 mg total) by nebulization every 6 (six) hours as needed for wheezing or shortness of breath.     ALPRAZolam 0.5 MG tablet  Commonly known as:  XANAX  Take 0.5 mg by mouth 3 (three) times daily.     amitriptyline 100 MG tablet  Commonly known as:  ELAVIL  Take 100 mg by mouth daily.     aspirin EC 81 MG tablet  Take 81 mg by mouth at bedtime.     atorvastatin 40 MG tablet  Commonly known as:  LIPITOR  Take 1 tablet (40 mg total) by mouth at bedtime.     Biotin 5000 MCG Caps  Take 5,000 mcg by mouth daily.     carvedilol 3.125 MG tablet  Commonly known as:  COREG  Take 1 tablet (3.125 mg total) by mouth 2 (two) times daily with a meal.     clobetasol cream 0.05 %  Commonly known as:  TEMOVATE  Apply 1 application topically 2 (two) times daily.     cycloSPORINE 0.05 % ophthalmic emulsion  Commonly known as:  RESTASIS  Place 1 drop into both eyes 2 (two) times daily.     docusate sodium 100 MG capsule  Commonly known as:  COLACE  Take 100 mg by mouth 2 (two) times daily as needed for mild constipation.     Fish Oil 1000 MG Caps  Take 1,000 mg by mouth daily.     fludrocortisone 0.1 MG tablet  Commonly known as:  FLORINEF  Take 0.1 mg by mouth daily.     folic acid 1 MG tablet  Commonly known as:  FOLVITE  Take 1 mg by mouth every morning.     HYDROcodone-acetaminophen 5-325 MG tablet   Commonly known as:  NORCO/VICODIN  Take 1 tablet by mouth 3 (three) times daily as needed for moderate pain.     hydroxychloroquine 200 MG tablet  Commonly known as:  PLAQUENIL  Take 200 mg by mouth daily. To be taken with food or milk     levofloxacin 750 MG tablet  Commonly known as:  LEVAQUIN  Take 1 tablet (750 mg total) by mouth every other day.     levothyroxine 25 MCG tablet  Commonly known as:  SYNTHROID, LEVOTHROID  Take 25 mcg by mouth every morning.     medroxyPROGESTERone 10 MG tablet  Commonly known as:  PROVERA  Take 1 tablet (10 mg total) by mouth daily.     multivitamin tablet  Take 1 tablet by mouth daily.     nitroGLYCERIN 0.4 MG SL tablet  Commonly known as:  NITROSTAT  Place 1 tablet (0.4 mg total) under the tongue every 5 (five) minutes as needed.     omeprazole 20 MG capsule  Commonly known as:  PRILOSEC  Take 20 mg by mouth daily.  potassium chloride 10 MEQ tablet  Commonly known as:  K-DUR,KLOR-CON  Take 10 mEq by mouth 4 (four) times daily.     PRESERVISION/LUTEIN PO  Take 1 capsule by mouth daily.     raloxifene 60 MG tablet  Commonly known as:  EVISTA  Take 60 mg by mouth every morning.     sodium bicarbonate 325 MG tablet  Take 650 mg by mouth 3 (three) times daily.     SOOTHE XP OP  Apply 1 drop to eye 3 (three) times daily.     Travoprost (BAK Free) 0.004 % Soln ophthalmic solution  Commonly known as:  TRAVATAN  Place 1 drop into both eyes at bedtime.     Vitamin D 400 units capsule  Take 400 Units by mouth every evening.        Discharge Assessment: Filed Vitals:   09/29/15 2142 09/30/15 0604  BP: 139/61 116/63  Pulse: 76 77  Temp: 97.8 F (36.6 C) 97.9 F (36.6 C)  Resp: 16 16   Skin clean, dry and intact without evidence of skin break down, no evidence of skin tears noted. IV catheter discontinued intact. Site without signs and symptoms of complications - no redness or edema noted at insertion site, patient  denies c/o pain - only slight tenderness at site.  Dressing with slight pressure applied.  D/c Instructions-Education: Discharge instructions given to patient/family with verbalized understanding. D/c education completed with patient/family including follow up instructions, medication list, d/c activities limitations if indicated, with other d/c instructions as indicated by MD - patient able to verbalize understanding, all questions fully answered. Patient instructed to return to ED, call 911, or call MD for any changes in condition.  Patient escorted via [place], and D/C home via private auto.  Salley Slaughter, RN 09/30/2015 2:44 PM

## 2015-10-02 ENCOUNTER — Ambulatory Visit (INDEPENDENT_AMBULATORY_CARE_PROVIDER_SITE_OTHER): Payer: Medicare Other | Admitting: Adult Health

## 2015-10-02 ENCOUNTER — Other Ambulatory Visit (HOSPITAL_COMMUNITY)
Admission: RE | Admit: 2015-10-02 | Discharge: 2015-10-02 | Disposition: A | Discharge status: Home / Self Care | Payer: Medicare Other | Source: Ambulatory Visit | Attending: Adult Health | Admitting: Adult Health

## 2015-10-02 ENCOUNTER — Other Ambulatory Visit: Payer: Self-pay | Admitting: Adult Health

## 2015-10-02 ENCOUNTER — Encounter: Payer: Self-pay | Admitting: Adult Health

## 2015-10-02 VITALS — BP 90/58 | HR 94 | Ht 70.0 in | Wt 123.0 lb

## 2015-10-02 DIAGNOSIS — Z01818 Encounter for other preprocedural examination: Secondary | ICD-10-CM | POA: Insufficient documentation

## 2015-10-02 DIAGNOSIS — I255 Ischemic cardiomyopathy: Secondary | ICD-10-CM | POA: Diagnosis not present

## 2015-10-02 DIAGNOSIS — R9439 Abnormal result of other cardiovascular function study: Secondary | ICD-10-CM

## 2015-10-02 DIAGNOSIS — I2510 Atherosclerotic heart disease of native coronary artery without angina pectoris: Secondary | ICD-10-CM | POA: Diagnosis not present

## 2015-10-02 LAB — CBC WITH DIFFERENTIAL/PLATELET
Basophils Absolute: 0 10*3/uL (ref 0.0–0.1)
Basophils Relative: 0 %
EOS ABS: 0.2 10*3/uL (ref 0.0–0.7)
Eosinophils Relative: 2 %
HCT: 33.9 % — ABNORMAL LOW (ref 36.0–46.0)
HEMOGLOBIN: 10.9 g/dL — AB (ref 12.0–15.0)
LYMPHS ABS: 1.2 10*3/uL (ref 0.7–4.0)
Lymphocytes Relative: 17 %
MCH: 29.1 pg (ref 26.0–34.0)
MCHC: 32.2 g/dL (ref 30.0–36.0)
MCV: 90.6 fL (ref 78.0–100.0)
Monocytes Absolute: 0.7 10*3/uL (ref 0.1–1.0)
Monocytes Relative: 10 %
NEUTROS PCT: 71 %
Neutro Abs: 5.1 10*3/uL (ref 1.7–7.7)
Platelets: 244 10*3/uL (ref 150–400)
RBC: 3.74 MIL/uL — AB (ref 3.87–5.11)
RDW: 16.3 % — ABNORMAL HIGH (ref 11.5–15.5)
WBC: 7.2 10*3/uL (ref 4.0–10.5)

## 2015-10-02 LAB — PROTIME-INR
INR: 1.07 (ref 0.00–1.49)
Prothrombin Time: 14.1 seconds (ref 11.6–15.2)

## 2015-10-02 LAB — BASIC METABOLIC PANEL
Anion gap: 7 (ref 5–15)
BUN: 24 mg/dL — ABNORMAL HIGH (ref 6–20)
CHLORIDE: 110 mmol/L (ref 101–111)
CO2: 20 mmol/L — ABNORMAL LOW (ref 22–32)
Calcium: 8.9 mg/dL (ref 8.9–10.3)
Creatinine, Ser: 1.86 mg/dL — ABNORMAL HIGH (ref 0.44–1.00)
GFR calc non Af Amer: 26 mL/min — ABNORMAL LOW (ref >60)
GFR, EST AFRICAN AMERICAN: 30 mL/min — AB (ref >60)
Glucose, Bld: 87 mg/dL (ref 65–99)
POTASSIUM: 3.8 mmol/L (ref 3.5–5.1)
SODIUM: 137 mmol/L (ref 135–145)

## 2015-10-02 NOTE — Progress Notes (Signed)
aCardiology Office Note   Date:  10/02/2015   ID:  Tiffany [name], DOB October 12, 1940, MRN [id number]  PCP:  Tiffany Mccreedy, MD  Cardiologist: [place]/  Tiffany Sims, NP   Chief Complaint  Patient presents with  . Coronary Artery Disease      History of Present Illness: Tiffany [name] is a 75 y.o. female who presents for posthospitalization followup with known history of coronary artery disease status post PCI of the RCA in 2006, followup catheterization completed in 2007 which revealed moderate nonobstructive coronary artery disease and a patent stent in the right coronary artery, CHF, orthostatic hypotension, hypertension, Sjogrens syndrome, chronic kidney disease systemic lupus. She was seen by Dr. Ellyn [name] after admission to the emergency room on 09/28/2015, with complaints of productive cough lightheadedness with standing.   The patient had an abnormal stress test as an outpatient after seen in ER. The patient's stress test revealed high risk study based on low ejection fraction. Unable to discern the presence of prior infarct or ischemia due to technical limitations. There was a large moderate intensity inferior wall defect with mild to moderate reversibility with LVEF less than 30%.  Dr. Ellyn [name] recommended that she be continued to be treated for acute respiratory tract infection and evaluate improveent in symptoms before deciding to proceed with catheterization. If she continued to have symptoms, decision will be made for catheterization versus medical management. Also recommended considering switching beta blocker to this bisoprolol from carvedilol as it had a left hypotensive effect. ACE inhibitor and diuretic were discontinued, in the setting of being on Florinef.  She comes today frail but feeling better. She continues to have a cough which is occasionally productive. She was sent home on 2 doses of Levaquin 750 mg for 2 days which he is to take every other day. She continues to feel  weak and tired. She denies chest pain. She denies dizziness. Although breathing status is better she continues to have some mild dyspnea on exertion.  Past Medical History  Diagnosis Date  . Lymphoma ([place])   . Systemic lupus erythematosus (Tiffany [name])   . Depression   . Hyperlipidemia   . Sjogren's syndrome (Tiffany [name])   . Orthostatic hypotension   . Dysphagia   . Syncope   . Anemia   . PMB (postmenopausal bleeding) 06/12/2015  . CKD (chronic kidney disease) stage 3, GFR 30-59 ml/min   . Kidney stones   . Heart murmur     "one dr told me I have one"  . Myocardial infarction (Tiffany [name]) 2006  . CAD (coronary artery disease)   . COPD (chronic obstructive pulmonary disease) ([place]) dx'd 09/28/2015  . Pneumonia 09/28/2015  . History of blood transfusion 09/2015    "blood was low"  . Rheumatoid arthritis (Tiffany [name])     "back" (09/28/2015)  . Chronic lower back pain     Past Surgical History  Procedure Laterality Date  . Mass excision Left ~ 2000    Lymphoma  . Cataract extraction w/ intraocular lens  implant, bilateral Bilateral   . Breast biopsy Left   . Tonsillectomy Left ~ 2000  . Laparoscopic cholecystectomy    . Lymph node dissection Left ~ 2000    "neck"  . Eye surgery    . Retinal detachment surgery Right   . Lithotripsy  X 3  . Coronary angioplasty with stent placement  2006    DES to mid RCA   . Dilation and curettage of uterus    . Tubal ligation  Current Outpatient Prescriptions  Medication Sig Dispense Refill  . acetaminophen (TYLENOL) 325 MG tablet Take 2 tablets (650 mg total) by mouth every 6 (six) hours as needed for mild pain (or Fever >/= 101). 30 tablet 0  . albuterol (PROVENTIL) (2.5 MG/3ML) 0.083% nebulizer solution Take 3 mLs (2.5 mg total) by nebulization every 6 (six) hours as needed for wheezing or shortness of breath. 75 mL 12  . ALPRAZolam (XANAX) 0.5 MG tablet Take 0.5 mg by mouth 3 (three) times daily.     Tiffany [name] amitriptyline (ELAVIL) 100 MG tablet Take 100 mg by mouth  daily.  0  . Artificial Tear Solution (SOOTHE XP OP) Apply 1 drop to eye 3 (three) times daily.     Tiffany [name] aspirin EC 81 MG tablet Take 81 mg by mouth at bedtime.     Tiffany [name] atorvastatin (LIPITOR) 40 MG tablet Take 1 tablet (40 mg total) by mouth at bedtime. (Patient taking differently: Take 40 mg by mouth every morning. ) 30 tablet 6  . Biotin 5000 MCG CAPS Take 5,000 mcg by mouth daily.    . carvedilol (COREG) 3.125 MG tablet Take 1 tablet (3.125 mg total) by mouth 2 (two) times daily with a meal. 60 tablet 0  . Cholecalciferol (VITAMIN D) 400 UNITS capsule Take 400 Units by mouth every evening.     . clobetasol cream (TEMOVATE) [id number] % Apply 1 application topically 2 (two) times daily.    . cycloSPORINE (RESTASIS) 0.05 % ophthalmic emulsion Place 1 drop into both eyes 2 (two) times daily.    Tiffany [name] docusate sodium (COLACE) 100 MG capsule Take 100 mg by mouth 2 (two) times daily as needed for mild constipation.    . fludrocortisone (FLORINEF) 0.1 MG tablet Take 0.1 mg by mouth daily.    . folic acid (FOLVITE) 1 MG tablet Take 1 mg by mouth every morning.     Tiffany [name] HYDROcodone-acetaminophen (NORCO/VICODIN) 5-325 MG per tablet Take 1 tablet by mouth 3 (three) times daily as needed for moderate pain.     . hydroxychloroquine (PLAQUENIL) 200 MG tablet Take 200 mg by mouth daily. To be taken with food or milk    . levofloxacin (LEVAQUIN) 750 MG tablet Take 1 tablet (750 mg total) by mouth every other day. 2 tablet 0  . levothyroxine (SYNTHROID, LEVOTHROID) 25 MCG tablet Take 25 mcg by mouth every morning.     . medroxyPROGESTERone (PROVERA) 10 MG tablet Take 1 tablet (10 mg total) by mouth daily. 30 tablet 11  . Multiple Vitamin (MULTIVITAMIN) tablet Take 1 tablet by mouth daily.    . Multiple Vitamins-Minerals (PRESERVISION/LUTEIN PO) Take 1 capsule by mouth daily.    . nitroGLYCERIN (NITROSTAT) 0.4 MG SL tablet Place 1 tablet (0.4 mg total) under the tongue every 5 (five) minutes as needed. 100 tablet 6  . Omega-3  Fatty Acids (FISH OIL) 1000 MG CAPS Take 1,000 mg by mouth daily.    Tiffany [name] omeprazole (PRILOSEC) 20 MG capsule Take 20 mg by mouth daily.     . potassium chloride (K-DUR,KLOR-CON) 10 MEQ tablet Take 10 mEq by mouth 4 (four) times daily.    . raloxifene (EVISTA) 60 MG tablet Take 60 mg by mouth every morning.     . sodium bicarbonate 325 MG tablet Take 650 mg by mouth 3 (three) times daily.     . Travoprost, BAK Free, (TRAVATAN) 0.004 % SOLN ophthalmic solution Place 1 drop into both eyes at bedtime.     No current facility-administered  medications for this visit.    Allergies:   Florinef; Iohexol; Sulfonamide derivatives; and Tape    Social History:  The patient  reports that she quit smoking about 2 weeks ago. Her smoking use included Cigarettes. She started smoking about 29 years ago. She has a 5.75 pack-year smoking history. She has never used smokeless tobacco. She reports that she does not drink alcohol or use illicit drugs.   Family History:  The patient's family history includes Breast cancer in her sister; Coronary artery disease in her father and mother; Esophageal cancer in her brother; Liver disease in her mother.    ROS: All other systems are reviewed and negative. Unless otherwise mentioned in H&P    PHYSICAL EXAM: VS:  BP 90/58 mmHg  Pulse 94  Ht 5\' 10"  (1.778 m)  Wt 123 lb (55.792 kg)  BMI 17.65 kg/m2  SpO2 96% , BMI Body mass index is 17.65 kg/(m^2). GEN: Well nourished, well developed, in no acute distressthin and frail. HEENT: normal Neck: no JVD, carotid bruits, or masses Cardiac: RRR; [id number] systolic mumur, no rubs, or gallops,no edema  Respiratory:  Bilateral crackles, taking deep breath causes coughing which does clear MS: no deformity or atrophy Skin: warm and dry, no rash. pale Neuro:  Strength and sensation are intact Psych: euthymic mood, full affect   Recent Labs: 11/03/2014: Magnesium 2.1 09/16/2015: B Natriuretic Peptide 187.6* 09/29/2015: ALT  14 09/30/2015: BUN 20; Creatinine, Ser 1.68*; Hemoglobin 9.4*; Platelets 219; Potassium 3.7; Sodium 143; TSH 3.797    Lipid Panel    Component Value Date/Time   CHOL 152 05/15/2011 0000   TRIG 61 05/15/2011 0000   HDL 58 05/15/2011 0000   CHOLHDL 2.6 05/15/2011 0000   VLDL 12 05/15/2011 0000   LDLCALC 82 05/15/2011 0000      Wt Readings from Last 3 Encounters:  10/02/15 123 lb (55.792 kg)  09/28/15 122 lb 6.4 oz (55.52 kg)  09/18/15 128 lb 14.4 oz (58.469 kg)      Other studies Reviewed: Additional studies/ records that were reviewed today include: echocardiogram Review of the above records demonstrates: 09/16/2015 Left ventricle: The cavity size was mildly dilated. Wall  thickness was normal. Systolic function was moderately to  severely reduced. The estimated ejection fraction was in the  range of 30% to 35%. Diffuse hypokinesis. Doppler parameters are  consistent with abnormal left ventricular relaxation (grade 1  diastolic dysfunction). - Mitral valve: Calcified annulus. There was moderate  regurgitation. - Left atrium: The atrium was mildly dilated.  Impressions: Moderate to severe global reduction in LV function; grade 1  diastolic dysfunction; mild LVE; mild LAE; moderate MR.   ASSESSMENT AND PLAN:  1.  Ischemic cardiomyopathy: known history of coronary artery disease with PCI of the right coronary artery in 2006, with abnormal Cardiolite MPI as an outpatient with a large, moderate intensity inferior wall defect with mild to moderate reversibility, however could not distinguish the extent that is history defect versus artifact. LVEF was less than 30%. Repeat echocardiogram confirms significant reduction in LV systolic function from [id number] to 30-35% with diffuse hypokinesis.  I have reviewed this patient's case with Dr. Domenic Polite, who has additionally reviewed echocardiogram, Cardiolite MPI, and note from Dr. Ellyn [name]. It is his recommendation that the patient  proceed with a right and left heart cath, but will not do LV gram and do to chronic kidney disease, with most recent creatinine of 1.68.  I have discussed this with the patient and her daughter who are  in the clinic today. The patient will need to finish her antibiotic, Levaquin which will be completed on Wednesday, May 24. We'll plan cardiac catheterization on Friday, May 26. I discussed this with the patient and her daughter, including risk and benefits, and they are willing to proceed with catheterization.Appropriate labs and orders will be completed. She will followup with Dr. Domenic Polite on next visit to discuss further treatment options.  At this time we'll continue her on carvedilol 3.125 twice a day only. Blood pressure is soft and therefore will not add diuretic or ACE inhibitor at this time. She will continue aspirin.  2. Coronary artery disease:discussed above. Proceed with cardiac catheterization. Continue statin aspirin and beta blocker.  3. Orthostatic hypotension:she'll remain on Florinef. Blood pressure is soft today in the office.  4. Chronic kidney disease: most recent creatinine 1. 68. No LV gram will be completed.  5. Pneumonia:she will finish her doses of Levaquin, which will be completed on Wednesday, May 24th. She will be given Robitussin over-the-counter when necessary cough and continue nebulizer treatments.  Current medicines are reviewed at length with the patient today.    Labs/ tests ordered today include: precardiac catheterization labs No orders of the defined types were placed in this encounter.     Disposition:   FU with Dr. Domenic Polite post catheterization.   Signed, Tiffany Sims, NP  10/02/2015 1:46 PM    [facility] [address], [place], [place] [address] Phone: [phone]; Fax: [phone]

## 2015-10-02 NOTE — Progress Notes (Deleted)
Name: Tiffany [name]    DOB: [birth year]  Age: 75 y.o.  MR#: [id number]       PCP:  Cleda Mccreedy, MD      Insurance: Payor: [employer] MEDICARE / Plan: [facility] MEDICARE / Product Type: *No Product type* /   CC:    Chief Complaint  Patient presents with  . Coronary Artery Disease    VS Filed Vitals:   10/02/15 1319  BP: 90/58  Pulse: 94  Height: 5\' 10"  (1.778 m)  Weight: 123 lb (55.792 kg)  SpO2: 96%    Weights Current Weight  10/02/15 123 lb (55.792 kg)  09/28/15 122 lb 6.4 oz (55.52 kg)  09/18/15 128 lb 14.4 oz (58.469 kg)    Blood Pressure  BP Readings from Last 3 Encounters:  10/02/15 90/58  09/30/15 116/63  09/18/15 122/67     Admit date:  (Not on file) Last encounter with RMR:  Visit date not found   Allergy Florinef; Iohexol; Sulfonamide derivatives; and Tape  Current Outpatient Prescriptions  Medication Sig Dispense Refill  . acetaminophen (TYLENOL) 325 MG tablet Take 2 tablets (650 mg total) by mouth every 6 (six) hours as needed for mild pain (or Fever >/= 101). 30 tablet 0  . albuterol (PROVENTIL) (2.5 MG/3ML) 0.083% nebulizer solution Take 3 mLs (2.5 mg total) by nebulization every 6 (six) hours as needed for wheezing or shortness of breath. 75 mL 12  . ALPRAZolam (XANAX) 0.5 MG tablet Take 0.5 mg by mouth 3 (three) times daily.     Marland Kitchen amitriptyline (ELAVIL) 100 MG tablet Take 100 mg by mouth daily.  0  . Artificial Tear Solution (SOOTHE XP OP) Apply 1 drop to eye 3 (three) times daily.     Marland Kitchen aspirin EC 81 MG tablet Take 81 mg by mouth at bedtime.     Marland Kitchen atorvastatin (LIPITOR) 40 MG tablet Take 1 tablet (40 mg total) by mouth at bedtime. (Patient taking differently: Take 40 mg by mouth every morning. ) 30 tablet 6  . Biotin 5000 MCG CAPS Take 5,000 mcg by mouth daily.    . carvedilol (COREG) 3.125 MG tablet Take 1 tablet (3.125 mg total) by mouth 2 (two) times daily with a meal. 60 tablet 0  . Cholecalciferol (VITAMIN D) 400 UNITS capsule Take 400 Units by  mouth every evening.     . clobetasol cream (TEMOVATE) [id number] % Apply 1 application topically 2 (two) times daily.    . cycloSPORINE (RESTASIS) 0.05 % ophthalmic emulsion Place 1 drop into both eyes 2 (two) times daily.    Marland Kitchen docusate sodium (COLACE) 100 MG capsule Take 100 mg by mouth 2 (two) times daily as needed for mild constipation.    . fludrocortisone (FLORINEF) 0.1 MG tablet Take 0.1 mg by mouth daily.    . folic acid (FOLVITE) 1 MG tablet Take 1 mg by mouth every morning.     Marland Kitchen HYDROcodone-acetaminophen (NORCO/VICODIN) 5-325 MG per tablet Take 1 tablet by mouth 3 (three) times daily as needed for moderate pain.     . hydroxychloroquine (PLAQUENIL) 200 MG tablet Take 200 mg by mouth daily. To be taken with food or milk    . levofloxacin (LEVAQUIN) 750 MG tablet Take 1 tablet (750 mg total) by mouth every other day. 2 tablet 0  . levothyroxine (SYNTHROID, LEVOTHROID) 25 MCG tablet Take 25 mcg by mouth every morning.     . medroxyPROGESTERone (PROVERA) 10 MG tablet Take 1 tablet (10 mg total) by mouth  daily. 30 tablet 11  . Multiple Vitamin (MULTIVITAMIN) tablet Take 1 tablet by mouth daily.    . Multiple Vitamins-Minerals (PRESERVISION/LUTEIN PO) Take 1 capsule by mouth daily.    . nitroGLYCERIN (NITROSTAT) 0.4 MG SL tablet Place 1 tablet (0.4 mg total) under the tongue every 5 (five) minutes as needed. 100 tablet 6  . Omega-3 Fatty Acids (FISH OIL) 1000 MG CAPS Take 1,000 mg by mouth daily.    Marland Kitchen omeprazole (PRILOSEC) 20 MG capsule Take 20 mg by mouth daily.     . potassium chloride (K-DUR,KLOR-CON) 10 MEQ tablet Take 10 mEq by mouth 4 (four) times daily.    . raloxifene (EVISTA) 60 MG tablet Take 60 mg by mouth every morning.     . sodium bicarbonate 325 MG tablet Take 650 mg by mouth 3 (three) times daily.     . Travoprost, BAK Free, (TRAVATAN) 0.004 % SOLN ophthalmic solution Place 1 drop into both eyes at bedtime.     No current facility-administered medications for this visit.     Discontinued Meds:   There are no discontinued medications.  Patient Active Problem List   Diagnosis Date Noted  . Acute bronchitis with COPD ([place]) 09/28/2015  . Recent NSTEMI (non-ST elevated myocardial infarction) ([place]) 09/15/2015  . Fever 09/15/2015  . Non-ST elevated myocardial infarction ([place]) 09/15/2015  . PMB (postmenopausal bleeding) 06/12/2015  . Atypical chest pain 07/13/2014  . DOE (dyspnea on exertion) 10/22/2013  . CHF, acute ([place]) 10/22/2013  . Systemic lupus erythematosus ([place])   . Coronary artery disease   . Palpitations 04/21/2013  . Chronic anticoagulation 01/29/2012  . Pulmonary embolism ([place]) 11/28/2011  . Chest pain 09/06/2011  . Anemia 05/24/2011  . CKD (chronic kidney disease), stage IV ([place]) 05/24/2011  . Hypertension   . Hyperlipidemia   . Arteriosclerotic cardiovascular disease (ASCVD)   . Sjogren's syndrome ([place])   . Tobacco abuse 08/08/2010  . WEIGHT LOSS, ABNORMAL 07/05/2010  . Orthostatic hypotension 01/24/2010  . SYNCOPE 08/28/2009  . LYMPHOMA 10/31/2008    LABS    Component Value Date/Time   NA 143 09/30/2015 0703   NA 140 09/29/2015 0308   NA 137 09/28/2015 1249   K 3.7 09/30/2015 0703   K 3.4* 09/29/2015 0308   K 3.7 09/28/2015 1249   CL 114* 09/30/2015 0703   CL 113* 09/29/2015 0308   CL 110 09/28/2015 1249   CO2 16* 09/30/2015 0703   CO2 18* 09/29/2015 0308   CO2 18* 09/28/2015 1249   GLUCOSE 78 09/30/2015 0703   GLUCOSE 82 09/29/2015 0308   GLUCOSE 86 09/28/2015 1249   BUN 20 09/30/2015 0703   BUN 23* 09/29/2015 0308   BUN 26* 09/28/2015 1249   CREATININE 1.68* 09/30/2015 0703   CREATININE 1.82* 09/29/2015 0308   CREATININE 1.95* 09/28/2015 1249   CREATININE 1.73* 02/02/2014 0914   CREATININE 1.72* 06/01/2012 1413   CREATININE 1.87* 05/08/2012 1440   CALCIUM 8.5* 09/30/2015 0703   CALCIUM 8.5* 09/29/2015 0308   CALCIUM 8.9 09/28/2015 1249   GFRNONAA 29* 09/30/2015 0703   GFRNONAA 26* 09/29/2015 0308   GFRNONAA  24* 09/28/2015 1249   GFRAA 34* 09/30/2015 0703   GFRAA 31* 09/29/2015 0308   GFRAA 28* 09/28/2015 1249   CMP     Component Value Date/Time   NA 143 09/30/2015 0703   K 3.7 09/30/2015 0703   CL 114* 09/30/2015 0703   CO2 16* 09/30/2015 0703   GLUCOSE 78 09/30/2015 0703   BUN 20  09/30/2015 0703   CREATININE 1.68* 09/30/2015 0703   CREATININE 1.73* 02/02/2014 0914   CALCIUM 8.5* 09/30/2015 0703   PROT 6.1* 09/29/2015 0308   ALBUMIN 2.0* 09/29/2015 0308   AST 14* 09/29/2015 0308   ALT 14 09/29/2015 0308   ALKPHOS 52 09/29/2015 0308   BILITOT 0.5 09/29/2015 0308   GFRNONAA 29* 09/30/2015 0703   GFRAA 34* 09/30/2015 0703       Component Value Date/Time   WBC 5.3 09/30/2015 0703   WBC 6.3 09/29/2015 0308   WBC 9.0 09/28/2015 1249   HGB 9.4* 09/30/2015 0703   HGB 9.5* 09/29/2015 0308   HGB 10.8* 09/28/2015 1249   HCT 30.0* 09/30/2015 0703   HCT 30.7* 09/29/2015 0308   HCT 34.1* 09/28/2015 1249   MCV 87.5 09/30/2015 0703   MCV 88.2 09/29/2015 0308   MCV 87.9 09/28/2015 1249    Lipid Panel     Component Value Date/Time   CHOL 152 05/15/2011 0000   TRIG 61 05/15/2011 0000   HDL 58 05/15/2011 0000   CHOLHDL 2.6 05/15/2011 0000   VLDL 12 05/15/2011 0000   LDLCALC 82 05/15/2011 0000    ABG No results found for: PHART, PCO2ART, PO2ART, HCO3, TCO2, ACIDBASEDEF, O2SAT   Lab Results  Component Value Date   TSH 3.797 09/30/2015   BNP (last 3 results)  Recent Labs  09/16/15 1115  BNP 187.6*    ProBNP (last 3 results) No results for input(s): PROBNP in the last 8760 hours.  Cardiac Panel (last 3 results) No results for input(s): CKTOTAL, CKMB, TROPONINI, RELINDX in the last 72 hours.  Iron/TIBC/Ferritin/ %Sat    Component Value Date/Time   IRON 13* 09/17/2015 0859   TIBC 151* 09/17/2015 0859   FERRITIN 145 09/17/2015 0859   IRONPCTSAT 9* 09/17/2015 0859   IRONPCTSAT 14* 05/24/2011 1211     EKG Orders placed or performed during the hospital encounter of  09/28/15  . EKG     Prior Assessment and Plan Problem List as of 10/02/2015      Cardiovascular and Mediastinum   Orthostatic hypotension   Last Assessment & Plan 04/21/2013 Office Visit Written 04/21/2013  1:28 PM by Lendon Colonel, NP    I have explained to her that she does not tolerate a lower more normal BP as this causes significant symptoms of dizziness and near syncope. She will continue on florinef 0.1 mg daily. She does not wish to take it at night. I have reviewed labs and found them to be WNL from ER.       SYNCOPE   Last Assessment & Plan 06/01/2012 Office Visit Written 06/01/2012  2:39 PM by Lendon Colonel, NP    No further complaints of syncope, dizziness or near syncope.      Hypertension   Last Assessment & Plan 11/02/2013 Office Visit Written 11/02/2013  1:29 PM by Lendon Colonel, NP    Blood pressure is currently very well-controlled. She has occasional dizziness when she stands or goes from a lying position to a standing position. She is taking her time when getting up. I reviewed her echo results revealing an EF of 45 percent. She is on Lasix every other day. I have asked her to keep track of her blood pressure, if her blood pressure decreases to less than [id number] systolic she is to hold her Lasix that day. I will repeat her echocardiogram in a few months to evaluate LV function improvement. We may be able to get her  off the Lasix altogether. Followup BMET will be completed in 3 months as well to evaluate for kidney function and sodium.      Arteriosclerotic cardiovascular disease (ASCVD)   Last Assessment & Plan 06/01/2012 Office Visit Written 06/01/2012  2:39 PM by Lendon Colonel, NP    Other than frequent palpitations, she is without complaint. No chest pain or significiant DOE.      Pulmonary embolism [facility])   Last Assessment & Plan 11/28/2011 Office Visit Written 11/28/2011  4:09 PM by Lendon Colonel, NP    She is currently on Xarelto but has not started  this yet. Will begin today. I will check CBC and BMET for follow-up labs with history of anemia and CKD on this medication. Hemoccult cards will be provided.        Coronary artery disease   Last Assessment & Plan 11/02/2013 Office Visit Written 11/02/2013  1:31 PM by Lendon Colonel, NP    She remained stable. She will remain on aspirin statin. She is not on a beta blocker in the setting of emphysema and bronchospasms associated. Blood pressure is currently well-controlled.      CHF, acute ([place])   Recent NSTEMI (non-ST elevated myocardial infarction) ([place])   Non-ST elevated myocardial infarction [facility])     Respiratory   Acute bronchitis with COPD ([place])     Digestive   Sjogren's syndrome ([place])     Genitourinary   CKD (chronic kidney disease), stage IV [facility])   Last Assessment & Plan 04/16/2012 Office Visit Written 04/16/2012  4:33 PM by Lendon Colonel, NP    Repeat labs in 2 weeks for ongoing assessment.        Other   LYMPHOMA   WEIGHT LOSS, ABNORMAL   Last Assessment & Plan 05/24/2011 Office Visit Written 05/24/2011  1:01 PM by Yehuda Savannah, MD    Weight has been stable since her last visit.      Tobacco abuse   Hyperlipidemia   Last Assessment & Plan 05/24/2011 Office Visit Written 05/24/2011 12:56 PM by Yehuda Savannah, MD    Lipid control was excellent when last assessed one month ago.  Current therapy will be continued.      Anemia   Last Assessment & Plan 05/24/2011 Office Visit Written 05/24/2011 12:53 PM by Yehuda Savannah, MD    Patient has had long-standing anemia, present at this level or worse for at least the past few years.  Iron studies obtained in 2011 were nondiagnostic with serum iron of 67, TIBC of 179, saturation 37% and a ferritin of 56, subsequently measured at 93.  She reports colonoscopy approximately 3-4 years ago with negative findings.  Anemia appears out of portion to her connective tissue problems.  Stool for Hemoccult testing will be  obtained.  TSH was normal in 2012 as was cortisol in 2011.  She has never been seen by a hematologist and will address a referral at her next visit with her PCP.      Chest pain   Last Assessment & Plan 11/02/2013 Office Visit Written 11/02/2013  1:30 PM by Lendon Colonel, NP    Completely resolved. She has had no recurrence with or without exertion.      Chronic anticoagulation   Palpitations   Last Assessment & Plan 04/21/2013 Office Visit Written 04/21/2013  1:31 PM by Lendon Colonel, NP    Review of EKG from ER and from today's office visit shows that she is having  PVC's. No couplets or bigeminy. They appear to be benign. Reassurance is given. Will see her in 6 months. She is advised to decrease caffeine as she drinks several Dr. Samson Frederic a day.      Systemic lupus erythematosus (HCC)   DOE (dyspnea on exertion)   Last Assessment & Plan 11/02/2013 Office Visit Written 11/02/2013  1:32 PM by Lendon Colonel, NP    This is chronic for her in the setting of emphysema. She is not very active. States it is not a lot during the summer and winter. She will continue bronchodilators as directed, and followup with primary care as needed.      Atypical chest pain   PMB (postmenopausal bleeding)   Fever       Imaging: Dg Chest 2 View  09/28/2015  CLINICAL DATA:  Weakness and dizziness with hypotension EXAM: CHEST  2 VIEW COMPARISON:  Sep 17, 2015 FINDINGS: There is mild bibasilar atelectatic change. There is stable bilateral apical pleural thickening. There is no frank edema or consolidation. Heart size and pulmonary vascularity are normal. No adenopathy. There is degenerative change in thoracic spine. Bones appear osteoporotic. IMPRESSION: Patchy bibasilar atelectasis. Stable apical pleural thickening bilaterally. No edema or consolidation. Stable cardiac silhouette. Electronically Signed   By: Lowella Grip III M.D.   On: 09/28/2015 13:24   Dg Chest 2 View  09/15/2015  CLINICAL DATA:   Chest pain, weakness for 2 weeks, low blood pressure EXAM: CHEST  2 VIEW COMPARISON:  11/15/2014 FINDINGS: Cardiomediastinal silhouette is stable. Mild hyperinflation. No infiltrate or pleural effusion. No pulmonary edema. Osteopenia and mild degenerative changes thoracic spine. IMPRESSION: No active cardiopulmonary disease. Electronically Signed   By: Lahoma Crocker M.D.   On: 09/15/2015 16:01   Nm Myocar Multi W/spect W/wall Motion / Ef  09/25/2015   There was no ST segment deviation noted during stress.  This is a high risk study. High risk study based on low ejection fraction, consider correlating findings with echo. Unable to discern the presence of prior infarct or active ischemia due to technical limitations described below  Technically difficult study. There is severe radiotracer uptake in the gut that engulfs the inferior wall in both the resting and post-stress images and also inhibits wall motion assessment of the inferior wall. There is large moderate intensity inferior wall defect with mild to moderate reversibility. Cannot distinguish the extent that is true defect verse artifact.  The left ventricular ejection fraction is severely decreased (<30%).    Dg Chest Port 1 View  09/17/2015  CLINICAL DATA:  Productive cough with fever 2 days ago. No chest pain or shortness of breath. EXAM: PORTABLE CHEST 1 VIEW COMPARISON:  Sep 15, 2015 FINDINGS: Biapical pleural parenchymal thickening is stable, slightly greater on the left than the right, likely scarring. No pneumothorax is identified on today's study. There is minimal opacity in the right base not seen yesterday, probably atelectasis. Minimal atelectasis is seen in the left base as well. A skin fold is seen over the left lateral chest. No convincing evidence of pneumothorax. The heart, hila, and mediastinum are unchanged and unremarkable. No overt edema. IMPRESSION: Mild bibasilar opacities, likely atelectasis. No other acute abnormalities.  Electronically Signed   By: Dorise Bullion III M.D   On: 09/17/2015 09:27   Ct Renal Stone Study  09/08/2015  CLINICAL DATA:  Hematuria, burning urination starting this morning, flank pain EXAM: CT ABDOMEN AND PELVIS WITHOUT CONTRAST TECHNIQUE: Multidetector CT imaging of the abdomen and pelvis  was performed following the standard protocol without IV contrast. COMPARISON:  Renal ultrasound 4/ 24/17 FINDINGS: Lower chest: Lung bases shows streaky atelectasis or scarring in left lower lobe posteriorly. Mild emphysematous changes. Small hiatal hernia. Hepatobiliary: Unenhanced liver shows no biliary ductal dilatation. The patient is status post cholecystectomy. No CBD dilatation. Pancreas: Unenhanced pancreas is mild atrophic. Spleen: Unenhanced spleen is unremarkable. Adrenals/Urinary Tract: Unenhanced adrenal glands are unremarkable. Multiple bilateral nonobstructive renal calculi are noted. The largest calculus in lower pole of the right kidney measures 5.3 mm. Largest calculus in lower pole of the left kidney measures 5.6 mm. Largest nonobstructive calculus in upper pole of the right kidney measures 4 mm. Mild dilatation of bilateral extrarenal pelvis. No hydronephrosis or hydroureter. No calcified ureteral calculi are noted bilaterally. At least 3 or 4 calcified calculi are noted within posterior aspect of the urinary bladder the largest measures 3.3 mm. Bilateral mild lobulated renal contour. Mild bilateral cortical thinning probable due to atrophy. Again noted exophytic cyst in lower pole of the left kidney measures 8 mm. Stomach/Bowel: There is no gastric outlet obstruction. No small bowel obstruction. No thickened or dilated small bowel loops. There is a low lying cecum. No pericecal inflammation. Abundant stool noted within cecum and right colon. Abundant stool noted within redundant transverse colon. Moderate stool and gas noted within descending colon. The appendix is not identified. No distal colonic  obstruction. Vascular/Lymphatic: Atherosclerotic calcifications of abdominal aorta and iliac arteries are noted. Reproductive: The uterus is atrophic. No adnexal masses noted. Small calcified adnexal vessels. Other: There is no ascites or free air. Musculoskeletal: No destructive bony lesions are noted within pelvis. Sagittal images of the spine shows osteopenia and degenerative changes lumbar spine. Significant disc space flattening with vacuum disc phenomenon and endplate sclerotic changes at L1-L2 and L5-S1 level. IMPRESSION: 1. There is bilateral nonobstructive nephrolithiasis. No hydronephrosis or hydroureter. No calcified ureteral calculi. Bilateral mild prominent extrarenal pelvis. 2. Again noted exophytic cyst in lower pole of the left kidney measures 8 mm. 3. Small calcified calculi are noted within posterior aspect of the urinary bladder the largest measures 3.3 mm. 4. There is a low lying cecum. No pericecal inflammation. The appendix is not identified. Abundant stool noted in right colon and transverse colon. There is redundant transverse colon. No distal colonic obstruction. 5. No small bowel obstruction. 6. Degenerative changes lumbar spine. 7. Small hiatal hernia. Electronically Signed   By: Lahoma Crocker M.D.   On: 09/08/2015 16:55

## 2015-10-02 NOTE — Patient Instructions (Addendum)
Your physician recommends that you schedule a follow-up appointment in: to be scheduled at hospital after heart cath with Dr Domenic Polite    Your physician has requested that you have a cardiac catheterization. Cardiac catheterization is used to diagnose and/or treat various heart conditions. Doctors may recommend this procedure for a number of different reasons. The most common reason is to evaluate chest pain. Chest pain can be a symptom of coronary artery disease (CAD), and cardiac catheterization can show whether plaque is narrowing or blocking your heart's arteries. This procedure is also used to evaluate the valves, as well as measure the blood flow and oxygen levels in different parts of your heart. For further information please visit [email or web address]. Please follow instruction sheet, as given.      GET LAB WORK NOW       Thank you for choosing [facility] !

## 2015-10-03 LAB — CULTURE, BLOOD (ROUTINE X 2)
CULTURE: NO GROWTH
Culture: NO GROWTH

## 2015-10-04 ENCOUNTER — Telehealth: Payer: Self-pay | Admitting: Cardiology

## 2015-10-04 NOTE — Telephone Encounter (Signed)
Follow-up      Pt calling back pt states a Tiffany [name] called her but not leave a message, the pt seems think it was about the stress test done on 5/22

## 2015-10-05 DIAGNOSIS — I255 Ischemic cardiomyopathy: Secondary | ICD-10-CM | POA: Diagnosis present

## 2015-10-05 DIAGNOSIS — R9439 Abnormal result of other cardiovascular function study: Secondary | ICD-10-CM | POA: Diagnosis present

## 2015-10-06 ENCOUNTER — Encounter (HOSPITAL_COMMUNITY)
Admission: RE | Disposition: A | Discharge status: Home / Self Care | Payer: Self-pay | Source: Ambulatory Visit | Attending: Cardiology

## 2015-10-06 ENCOUNTER — Ambulatory Visit (HOSPITAL_COMMUNITY)
Admission: RE | Admit: 2015-10-06 | Discharge: 2015-10-06 | Disposition: A | Discharge status: Home / Self Care | Payer: Medicare Other | Source: Ambulatory Visit | Attending: Cardiology | Admitting: Cardiology

## 2015-10-06 DIAGNOSIS — I130 Hypertensive heart and chronic kidney disease with heart failure and stage 1 through stage 4 chronic kidney disease, or unspecified chronic kidney disease: Secondary | ICD-10-CM | POA: Diagnosis not present

## 2015-10-06 DIAGNOSIS — I10 Essential (primary) hypertension: Secondary | ICD-10-CM | POA: Diagnosis present

## 2015-10-06 DIAGNOSIS — F329 Major depressive disorder, single episode, unspecified: Secondary | ICD-10-CM | POA: Insufficient documentation

## 2015-10-06 DIAGNOSIS — I951 Orthostatic hypotension: Secondary | ICD-10-CM | POA: Insufficient documentation

## 2015-10-06 DIAGNOSIS — Z87442 Personal history of urinary calculi: Secondary | ICD-10-CM | POA: Insufficient documentation

## 2015-10-06 DIAGNOSIS — J189 Pneumonia, unspecified organism: Secondary | ICD-10-CM | POA: Diagnosis not present

## 2015-10-06 DIAGNOSIS — I509 Heart failure, unspecified: Secondary | ICD-10-CM | POA: Diagnosis not present

## 2015-10-06 DIAGNOSIS — Z955 Presence of coronary angioplasty implant and graft: Secondary | ICD-10-CM | POA: Diagnosis not present

## 2015-10-06 DIAGNOSIS — M069 Rheumatoid arthritis, unspecified: Secondary | ICD-10-CM | POA: Diagnosis not present

## 2015-10-06 DIAGNOSIS — M3500 Sicca syndrome, unspecified: Secondary | ICD-10-CM | POA: Insufficient documentation

## 2015-10-06 DIAGNOSIS — I2584 Coronary atherosclerosis due to calcified coronary lesion: Secondary | ICD-10-CM | POA: Insufficient documentation

## 2015-10-06 DIAGNOSIS — M329 Systemic lupus erythematosus, unspecified: Secondary | ICD-10-CM | POA: Insufficient documentation

## 2015-10-06 DIAGNOSIS — Z87891 Personal history of nicotine dependence: Secondary | ICD-10-CM | POA: Diagnosis not present

## 2015-10-06 DIAGNOSIS — N183 Chronic kidney disease, stage 3 (moderate): Secondary | ICD-10-CM | POA: Insufficient documentation

## 2015-10-06 DIAGNOSIS — I2510 Atherosclerotic heart disease of native coronary artery without angina pectoris: Secondary | ICD-10-CM | POA: Diagnosis not present

## 2015-10-06 DIAGNOSIS — Z8249 Family history of ischemic heart disease and other diseases of the circulatory system: Secondary | ICD-10-CM | POA: Insufficient documentation

## 2015-10-06 DIAGNOSIS — I255 Ischemic cardiomyopathy: Secondary | ICD-10-CM | POA: Diagnosis not present

## 2015-10-06 DIAGNOSIS — I214 Non-ST elevation (NSTEMI) myocardial infarction: Secondary | ICD-10-CM | POA: Diagnosis present

## 2015-10-06 DIAGNOSIS — R9439 Abnormal result of other cardiovascular function study: Secondary | ICD-10-CM | POA: Diagnosis present

## 2015-10-06 DIAGNOSIS — I252 Old myocardial infarction: Secondary | ICD-10-CM | POA: Insufficient documentation

## 2015-10-06 DIAGNOSIS — R931 Abnormal findings on diagnostic imaging of heart and coronary circulation: Secondary | ICD-10-CM

## 2015-10-06 DIAGNOSIS — Z7982 Long term (current) use of aspirin: Secondary | ICD-10-CM | POA: Diagnosis not present

## 2015-10-06 DIAGNOSIS — I25119 Atherosclerotic heart disease of native coronary artery with unspecified angina pectoris: Secondary | ICD-10-CM | POA: Diagnosis present

## 2015-10-06 DIAGNOSIS — D649 Anemia, unspecified: Secondary | ICD-10-CM | POA: Diagnosis not present

## 2015-10-06 DIAGNOSIS — J449 Chronic obstructive pulmonary disease, unspecified: Secondary | ICD-10-CM | POA: Diagnosis not present

## 2015-10-06 DIAGNOSIS — E785 Hyperlipidemia, unspecified: Secondary | ICD-10-CM | POA: Insufficient documentation

## 2015-10-06 HISTORY — PX: CARDIAC CATHETERIZATION: SHX172

## 2015-10-06 LAB — POCT I-STAT 3, VENOUS BLOOD GAS (G3P V)
Acid-base deficit: 8 mmol/L — ABNORMAL HIGH (ref 0.0–2.0)
BICARBONATE: 16.8 meq/L — AB (ref 20.0–24.0)
O2 Saturation: 62 %
PCO2 VEN: 30.2 mmHg — AB (ref 45.0–50.0)
TCO2: 18 mmol/L (ref 0–100)
pH, Ven: 7.352 — ABNORMAL HIGH (ref 7.250–7.300)
pO2, Ven: 33 mmHg (ref 31.0–45.0)

## 2015-10-06 LAB — BASIC METABOLIC PANEL
Anion gap: 9 (ref 5–15)
Anion gap: 9 (ref 5–15)
BUN: 26 mg/dL — AB (ref 6–20)
BUN: 26 mg/dL — AB (ref 6–20)
CHLORIDE: 111 mmol/L (ref 101–111)
CHLORIDE: 111 mmol/L (ref 101–111)
CO2: 20 mmol/L — ABNORMAL LOW (ref 22–32)
CO2: 18 mmol/L — AB (ref 22–32)
CREATININE: 2.15 mg/dL — AB (ref 0.44–1.00)
CREATININE: 1.97 mg/dL — AB (ref 0.44–1.00)
Calcium: 9.2 mg/dL (ref 8.9–10.3)
Calcium: 8.8 mg/dL — ABNORMAL LOW (ref 8.9–10.3)
GFR calc Af Amer: 25 mL/min — ABNORMAL LOW (ref >60)
GFR calc Af Amer: 28 mL/min — ABNORMAL LOW (ref >60)
GFR calc non Af Amer: 22 mL/min — ABNORMAL LOW (ref >60)
GFR calc non Af Amer: 24 mL/min — ABNORMAL LOW (ref >60)
GLUCOSE: 94 mg/dL (ref 65–99)
GLUCOSE: 84 mg/dL (ref 65–99)
POTASSIUM: 3.4 mmol/L — AB (ref 3.5–5.1)
POTASSIUM: 3.3 mmol/L — AB (ref 3.5–5.1)
SODIUM: 140 mmol/L (ref 135–145)
SODIUM: 138 mmol/L (ref 135–145)

## 2015-10-06 LAB — POCT I-STAT 3, ART BLOOD GAS (G3+)
ACID-BASE DEFICIT: 9 mmol/L — AB (ref 0.0–2.0)
Bicarbonate: 16.1 mEq/L — ABNORMAL LOW (ref 20.0–24.0)
O2 Saturation: 94 %
PH ART: 7.323 — AB (ref 7.350–7.450)
TCO2: 17 mmol/L (ref 0–100)
pCO2 arterial: 31 mmHg — ABNORMAL LOW (ref 35.0–45.0)
pO2, Arterial: 76 mmHg — ABNORMAL LOW (ref 80.0–100.0)

## 2015-10-06 LAB — POCT ACTIVATED CLOTTING TIME: Activated Clotting Time: 178 seconds

## 2015-10-06 SURGERY — RIGHT/LEFT HEART CATH AND CORONARY ANGIOGRAPHY

## 2015-10-06 MED ORDER — SODIUM CHLORIDE 0.9 % WEIGHT BASED INFUSION
3.0000 mL/kg/h | INTRAVENOUS | Status: AC
  Administered 2015-10-06: 3 mL/kg/h via INTRAVENOUS

## 2015-10-06 MED ORDER — HEPARIN SODIUM (PORCINE) 1000 UNIT/ML IJ SOLN
INTRAMUSCULAR | Status: AC
  Filled 2015-10-06: qty 1

## 2015-10-06 MED ORDER — VERAPAMIL HCL 2.5 MG/ML IV SOLN
INTRAVENOUS | Status: AC
  Filled 2015-10-06: qty 2

## 2015-10-06 MED ORDER — SODIUM CHLORIDE 0.9% FLUSH: 3.0000 mL | INTRAVENOUS | Status: DC | PRN

## 2015-10-06 MED ORDER — FENTANYL CITRATE (PF) 100 MCG/2ML IJ SOLN
INTRAMUSCULAR | Status: DC | PRN
  Administered 2015-10-06 (×3): 25 ug via INTRAVENOUS

## 2015-10-06 MED ORDER — IOPAMIDOL (ISOVUE-370) INJECTION 76%
INTRAVENOUS | Status: DC | PRN
  Administered 2015-10-06: 50 mL via INTRA_ARTERIAL

## 2015-10-06 MED ORDER — MIDAZOLAM HCL 2 MG/2ML IJ SOLN
INTRAMUSCULAR | Status: AC
  Filled 2015-10-06: qty 2

## 2015-10-06 MED ORDER — LIDOCAINE HCL (PF) 1 % IJ SOLN
INTRAMUSCULAR | Status: DC | PRN
  Administered 2015-10-06: 20 mL

## 2015-10-06 MED ORDER — SODIUM CHLORIDE 0.9% FLUSH: 3.0000 mL | Freq: Two times a day (BID) | INTRAVENOUS | Status: DC

## 2015-10-06 MED ORDER — HEPARIN (PORCINE) IN NACL 2-0.9 UNIT/ML-% IJ SOLN
INTRAMUSCULAR | Status: AC
  Filled 2015-10-06: qty 1000

## 2015-10-06 MED ORDER — ONDANSETRON HCL 4 MG/2ML IJ SOLN: 4.0000 mg | Freq: Four times a day (QID) | INTRAMUSCULAR | Status: DC | PRN

## 2015-10-06 MED ORDER — IOPAMIDOL (ISOVUE-370) INJECTION 76%
INTRAVENOUS | Status: AC
  Filled 2015-10-06: qty 100

## 2015-10-06 MED ORDER — ASPIRIN 81 MG PO CHEW: 81.0000 mg | CHEWABLE_TABLET | ORAL | Status: DC

## 2015-10-06 MED ORDER — MORPHINE SULFATE (PF) 2 MG/ML IV SOLN: 2.0000 mg | INTRAVENOUS | Status: DC | PRN

## 2015-10-06 MED ORDER — HEPARIN (PORCINE) IN NACL 2-0.9 UNIT/ML-% IJ SOLN
INTRAMUSCULAR | Status: DC | PRN
  Administered 2015-10-06: 1000 mL

## 2015-10-06 MED ORDER — VERAPAMIL HCL 2.5 MG/ML IV SOLN
INTRAVENOUS | Status: DC | PRN
  Administered 2015-10-06: 1.2 mL via INTRA_ARTERIAL

## 2015-10-06 MED ORDER — SODIUM CHLORIDE 0.9 % IV SOLN: INTRAVENOUS | Status: AC

## 2015-10-06 MED ORDER — SODIUM CHLORIDE 0.9 % WEIGHT BASED INFUSION
1.0000 mL/kg/h | INTRAVENOUS | Status: DC
  Administered 2015-10-06: 1 mL/kg/h via INTRAVENOUS

## 2015-10-06 MED ORDER — SODIUM CHLORIDE 0.9 % IV SOLN
INTRAVENOUS | Status: DC
  Administered 2015-10-06: 08:00:00 via INTRAVENOUS

## 2015-10-06 MED ORDER — ACETAMINOPHEN 325 MG PO TABS: 650.0000 mg | ORAL_TABLET | ORAL | Status: DC | PRN

## 2015-10-06 MED ORDER — SODIUM CHLORIDE 0.9 % IV SOLN: 250.0000 mL | INTRAVENOUS | Status: DC | PRN

## 2015-10-06 MED ORDER — MIDAZOLAM HCL 2 MG/2ML IJ SOLN
INTRAMUSCULAR | Status: DC | PRN
  Administered 2015-10-06 (×5): 1 mg via INTRAVENOUS

## 2015-10-06 MED ORDER — HEPARIN SODIUM (PORCINE) 1000 UNIT/ML IJ SOLN
INTRAMUSCULAR | Status: DC | PRN
  Administered 2015-10-06: 3000 [IU] via INTRAVENOUS

## 2015-10-06 MED ORDER — FENTANYL CITRATE (PF) 100 MCG/2ML IJ SOLN
INTRAMUSCULAR | Status: AC
  Filled 2015-10-06: qty 2

## 2015-10-06 MED ORDER — LIDOCAINE HCL (PF) 1 % IJ SOLN
INTRAMUSCULAR | Status: AC
  Filled 2015-10-06: qty 30

## 2015-10-06 SURGICAL SUPPLY — 16 items
CATH INFINITI 5FR ANG PIGTAIL (CATHETERS) ×3 IMPLANT
CATH OPTITORQUE TIG 4.0 5F (CATHETERS) ×3 IMPLANT
CATH SWAN GANZ 7F STRAIGHT (CATHETERS) ×3 IMPLANT
COVER PRB 48X5XTLSCP FOLD TPE (BAG) ×2 IMPLANT
COVER PROBE 5X48 (BAG) ×4
DEVICE RAD COMP TR BAND LRG (VASCULAR PRODUCTS) ×3 IMPLANT
GLIDESHEATH SLEND A-KIT 6F 22G (SHEATH) ×3 IMPLANT
KIT HEART LEFT (KITS) ×3 IMPLANT
KIT HEART RIGHT NAMIC (KITS) ×3 IMPLANT
PACK CARDIAC CATHETERIZATION (CUSTOM PROCEDURE TRAY) ×3 IMPLANT
SHEATH FAST CATH BRACH 5F 5CM (SHEATH) ×3 IMPLANT
SHEATH PINNACLE 7F 10CM (SHEATH) ×3 IMPLANT
TRANSDUCER W/STOPCOCK (MISCELLANEOUS) ×6 IMPLANT
TUBING CIL FLEX 10 FLL-RA (TUBING) ×3 IMPLANT
WIRE HI TORQ VERSACORE-J 145CM (WIRE) ×3 IMPLANT
WIRE SAFE-T 1.5MM-J .035X260CM (WIRE) ×3 IMPLANT

## 2015-10-06 NOTE — H&P (View-Only) (Signed)
aCardiology Office Note   Date:  10/02/2015   ID:  Tiffany [name], DOB 01/20/1941, MRN [id number]  PCP:  Tiffany Mccreedy, MD  Cardiologist: Tiffany [name]/  Tiffany Sims, NP   Chief Complaint  Patient presents with  . Coronary Artery Disease      History of Present Illness: Tiffany [name] is a 75 y.o. female who presents for posthospitalization followup with known history of coronary artery disease status post PCI of the RCA in 2006, followup catheterization completed in 2007 which revealed moderate nonobstructive coronary artery disease and a patent stent in the right coronary artery, CHF, orthostatic hypotension, hypertension, Sjogrens syndrome, chronic kidney disease systemic lupus. She was seen by Dr. Ellyn [name] after admission to the emergency room on 09/28/2015, with complaints of productive cough lightheadedness with standing.   The patient had an abnormal stress test as an outpatient after seen in ER. The patient's stress test revealed high risk study based on low ejection fraction. Unable to discern the presence of prior infarct or ischemia due to technical limitations. There was a large moderate intensity inferior wall defect with mild to moderate reversibility with LVEF less than 30%.  Dr. Ellyn [name] recommended that she be continued to be treated for acute respiratory tract infection and evaluate improveent in symptoms before deciding to proceed with catheterization. If she continued to have symptoms, decision will be made for catheterization versus medical management. Also recommended considering switching beta blocker to this bisoprolol from carvedilol as it had a left hypotensive effect. ACE inhibitor and diuretic were discontinued, in the setting of being on Florinef.  She comes today frail but feeling better. She continues to have a cough which is occasionally productive. She was sent home on 2 doses of Levaquin 750 mg for 2 days which he is to take every other day. She continues to feel  weak and tired. She denies chest pain. She denies dizziness. Although breathing status is better she continues to have some mild dyspnea on exertion.  Past Medical History  Diagnosis Date  . Lymphoma ([place])   . Systemic lupus erythematosus ([place])   . Depression   . Hyperlipidemia   . Sjogren's syndrome ([place])   . Orthostatic hypotension   . Dysphagia   . Syncope   . Anemia   . PMB (postmenopausal bleeding) 06/12/2015  . CKD (chronic kidney disease) stage 3, GFR 30-59 ml/min   . Kidney stones   . Heart murmur     "one dr told me I have one"  . Myocardial infarction ([place]) 2006  . CAD (coronary artery disease)   . COPD (chronic obstructive pulmonary disease) ([place]) dx'd 09/28/2015  . Pneumonia 09/28/2015  . History of blood transfusion 09/2015    "blood was low"  . Rheumatoid arthritis ([place])     "back" (09/28/2015)  . Chronic lower back pain     Past Surgical History  Procedure Laterality Date  . Mass excision Left ~ 2000    Lymphoma  . Cataract extraction w/ intraocular lens  implant, bilateral Bilateral   . Breast biopsy Left   . Tonsillectomy Left ~ 2000  . Laparoscopic cholecystectomy    . Lymph node dissection Left ~ 2000    "neck"  . Eye surgery    . Retinal detachment surgery Right   . Lithotripsy  X 3  . Coronary angioplasty with stent placement  2006    DES to mid RCA   . Dilation and curettage of uterus    . Tubal ligation  Current Outpatient Prescriptions  Medication Sig Dispense Refill  . acetaminophen (TYLENOL) 325 MG tablet Take 2 tablets (650 mg total) by mouth every 6 (six) hours as needed for mild pain (or Fever >/= 101). 30 tablet 0  . albuterol (PROVENTIL) (2.5 MG/3ML) 0.083% nebulizer solution Take 3 mLs (2.5 mg total) by nebulization every 6 (six) hours as needed for wheezing or shortness of breath. 75 mL 12  . ALPRAZolam (XANAX) 0.5 MG tablet Take 0.5 mg by mouth 3 (three) times daily.     Marland Kitchen amitriptyline (ELAVIL) 100 MG tablet Take 100 mg by mouth  daily.  0  . Artificial Tear Solution (SOOTHE XP OP) Apply 1 drop to eye 3 (three) times daily.     Marland Kitchen aspirin EC 81 MG tablet Take 81 mg by mouth at bedtime.     Marland Kitchen atorvastatin (LIPITOR) 40 MG tablet Take 1 tablet (40 mg total) by mouth at bedtime. (Patient taking differently: Take 40 mg by mouth every morning. ) 30 tablet 6  . Biotin 5000 MCG CAPS Take 5,000 mcg by mouth daily.    . carvedilol (COREG) 3.125 MG tablet Take 1 tablet (3.125 mg total) by mouth 2 (two) times daily with a meal. 60 tablet 0  . Cholecalciferol (VITAMIN D) 400 UNITS capsule Take 400 Units by mouth every evening.     . clobetasol cream (TEMOVATE) [id number] % Apply 1 application topically 2 (two) times daily.    . cycloSPORINE (RESTASIS) 0.05 % ophthalmic emulsion Place 1 drop into both eyes 2 (two) times daily.    Marland Kitchen docusate sodium (COLACE) 100 MG capsule Take 100 mg by mouth 2 (two) times daily as needed for mild constipation.    . fludrocortisone (FLORINEF) 0.1 MG tablet Take 0.1 mg by mouth daily.    . folic acid (FOLVITE) 1 MG tablet Take 1 mg by mouth every morning.     Marland Kitchen HYDROcodone-acetaminophen (NORCO/VICODIN) 5-325 MG per tablet Take 1 tablet by mouth 3 (three) times daily as needed for moderate pain.     . hydroxychloroquine (PLAQUENIL) 200 MG tablet Take 200 mg by mouth daily. To be taken with food or milk    . levofloxacin (LEVAQUIN) 750 MG tablet Take 1 tablet (750 mg total) by mouth every other day. 2 tablet 0  . levothyroxine (SYNTHROID, LEVOTHROID) 25 MCG tablet Take 25 mcg by mouth every morning.     . medroxyPROGESTERone (PROVERA) 10 MG tablet Take 1 tablet (10 mg total) by mouth daily. 30 tablet 11  . Multiple Vitamin (MULTIVITAMIN) tablet Take 1 tablet by mouth daily.    . Multiple Vitamins-Minerals (PRESERVISION/LUTEIN PO) Take 1 capsule by mouth daily.    . nitroGLYCERIN (NITROSTAT) 0.4 MG SL tablet Place 1 tablet (0.4 mg total) under the tongue every 5 (five) minutes as needed. 100 tablet 6  . Omega-3  Fatty Acids (FISH OIL) 1000 MG CAPS Take 1,000 mg by mouth daily.    Marland Kitchen omeprazole (PRILOSEC) 20 MG capsule Take 20 mg by mouth daily.     . potassium chloride (K-DUR,KLOR-CON) 10 MEQ tablet Take 10 mEq by mouth 4 (four) times daily.    . raloxifene (EVISTA) 60 MG tablet Take 60 mg by mouth every morning.     . sodium bicarbonate 325 MG tablet Take 650 mg by mouth 3 (three) times daily.     . Travoprost, BAK Free, (TRAVATAN) 0.004 % SOLN ophthalmic solution Place 1 drop into both eyes at bedtime.     No current facility-administered  medications for this visit.    Allergies:   Florinef; Iohexol; Sulfonamide derivatives; and Tape    Social History:  The patient  reports that she quit smoking about 2 weeks ago. Her smoking use included Cigarettes. She started smoking about 29 years ago. She has a 5.75 pack-year smoking history. She has never used smokeless tobacco. She reports that she does not drink alcohol or use illicit drugs.   Family History:  The patient's family history includes Breast cancer in her sister; Coronary artery disease in her father and mother; Esophageal cancer in her brother; Liver disease in her mother.    ROS: All other systems are reviewed and negative. Unless otherwise mentioned in H&P    PHYSICAL EXAM: VS:  BP 90/58 mmHg  Pulse 94  Ht 5\' 10"  (1.778 m)  Wt 123 lb (55.792 kg)  BMI 17.65 kg/m2  SpO2 96% , BMI Body mass index is 17.65 kg/(m^2). GEN: Well nourished, well developed, in no acute distressthin and frail. HEENT: normal Neck: no JVD, carotid bruits, or masses Cardiac: RRR; [id number] systolic mumur, no rubs, or gallops,no edema  Respiratory:  Bilateral crackles, taking deep breath causes coughing which does clear MS: no deformity or atrophy Skin: warm and dry, no rash. pale Neuro:  Strength and sensation are intact Psych: euthymic mood, full affect   Recent Labs: 11/03/2014: Magnesium 2.1 09/16/2015: B Natriuretic Peptide 187.6* 09/29/2015: ALT  14 09/30/2015: BUN 20; Creatinine, Ser 1.68*; Hemoglobin 9.4*; Platelets 219; Potassium 3.7; Sodium 143; TSH 3.797    Lipid Panel    Component Value Date/Time   CHOL 152 05/15/2011 0000   TRIG 61 05/15/2011 0000   HDL 58 05/15/2011 0000   CHOLHDL 2.6 05/15/2011 0000   VLDL 12 05/15/2011 0000   LDLCALC 82 05/15/2011 0000      Wt Readings from Last 3 Encounters:  10/02/15 123 lb (55.792 kg)  09/28/15 122 lb 6.4 oz (55.52 kg)  09/18/15 128 lb 14.4 oz (58.469 kg)      Other studies Reviewed: Additional studies/ records that were reviewed today include: echocardiogram Review of the above records demonstrates: 09/16/2015 Left ventricle: The cavity size was mildly dilated. Wall  thickness was normal. Systolic function was moderately to  severely reduced. The estimated ejection fraction was in the  range of 30% to 35%. Diffuse hypokinesis. Doppler parameters are  consistent with abnormal left ventricular relaxation (grade 1  diastolic dysfunction). - Mitral valve: Calcified annulus. There was moderate  regurgitation. - Left atrium: The atrium was mildly dilated.  Impressions: Moderate to severe global reduction in LV function; grade 1  diastolic dysfunction; mild LVE; mild LAE; moderate MR.   ASSESSMENT AND PLAN:  1.  Ischemic cardiomyopathy: known history of coronary artery disease with PCI of the right coronary artery in 2006, with abnormal Cardiolite MPI as an outpatient with a large, moderate intensity inferior wall defect with mild to moderate reversibility, however could not distinguish the extent that is history defect versus artifact. LVEF was less than 30%. Repeat echocardiogram confirms significant reduction in LV systolic function from [id number] to 30-35% with diffuse hypokinesis.  I have reviewed this patient's case with Dr. Domenic Polite, who has additionally reviewed echocardiogram, Cardiolite MPI, and note from Dr. Ellyn [name]. It is his recommendation that the patient  proceed with a right and left heart cath, but will not do LV gram and do to chronic kidney disease, with most recent creatinine of 1.68.  I have discussed this with the patient and her daughter who are  in the clinic today. The patient will need to finish her antibiotic, Levaquin which will be completed on Wednesday, May 24. We'll plan cardiac catheterization on Friday, May 26. I discussed this with the patient and her daughter, including risk and benefits, and they are willing to proceed with catheterization.Appropriate labs and orders will be completed. She will followup with Dr. Domenic Polite on next visit to discuss further treatment options.  At this time we'll continue her on carvedilol 3.125 twice a day only. Blood pressure is soft and therefore will not add diuretic or ACE inhibitor at this time. She will continue aspirin.  2. Coronary artery disease:discussed above. Proceed with cardiac catheterization. Continue statin aspirin and beta blocker.  3. Orthostatic hypotension:she'll remain on Florinef. Blood pressure is soft today in the office.  4. Chronic kidney disease: most recent creatinine 1. 68. No LV gram will be completed.  5. Pneumonia:she will finish her doses of Levaquin, which will be completed on Wednesday, May 24th. She will be given Robitussin over-the-counter when necessary cough and continue nebulizer treatments.  Current medicines are reviewed at length with the patient today.    Labs/ tests ordered today include: precardiac catheterization labs No orders of the defined types were placed in this encounter.     Disposition:   FU with Dr. Domenic Polite post catheterization.   Signed, Tiffany Sims, NP  10/02/2015 1:46 PM    [facility] [address] [place], [place] [address] Phone: [phone]; Fax: [phone]

## 2015-10-06 NOTE — Interval H&P Note (Signed)
History and Physical Interval Note:  10/06/2015 3:09 PM  Tiffany [name]  has presented today for surgery, with the diagnosis of LV disfunction = abnormal nuclear stress test.  She had a nuclear stress test done in early May after presenting with atypical sounding symptoms for angina in the setting of a URI. Unfortunately the nuclear stress test was read as abnormal, therefore she is referred for invasive evaluation.  The various methods of treatment have been discussed with the patient and family. After consideration of risks, benefits and other options for treatment, the patient has consented to  Procedure(s): Right/Left Heart Cath and Coronary Angiography (N/A) with possible per case coronary intervention as a surgical intervention .  The patient's history has been reviewed, patient examined, no change in status, stable for surgery.  I have reviewed the patient's chart and labs.  Questions were answered to the patient's satisfaction.    Cath Lab Visit (complete for each Cath Lab visit)  Clinical Evaluation Leading to the Procedure:   ACS: No.  Non-ACS:    Anginal Classification: CCS I  Anti-ischemic medical therapy: Minimal Therapy (1 class of medications)  Non-Invasive Test Results: High-risk stress test findings: cardiac mortality >3%/year  Prior CABG: No previous CABG   Cardiomyopathies (Right and Left Heart Catheterization OR  Right Heart Catheterization Alone With/Without Left Ventriculography and Coronary Angiography)   Patient Information:    Re-evaluation of known cardiomyopathy   Change in clinical status or cardiac exam or to guide therapy  AUC Score:   A (7)   Indication:   94   PCI AUC Ischemic Symptoms? CCS I (Ordinary physical activity does not cause anginal symptoms) Anti-ischemic Medical Therapy? Minimal Therapy (1 class of medications) Non-invasive Test Results? High-risk stress test findings: cardiac mortality >3%/yr Prior CABG? No Previous  CABG   Patient Information:   1-2V CAD, no prox LAD  A (7)  Indication: 18; Score: 7   Patient Information:   CTO of 1 vessel, no other CAD  U (5)  Indication: 28; Score: 5   Patient Information:   1V CAD with prox LAD  A (8)  Indication: 34; Score: 8   Patient Information:   2V-CAD with prox LAD  A (8)  Indication: 40; Score: 8   Patient Information:   3V-CAD without LMCA  A (8)  Indication: 46; Score: 8   Patient Information:   3V-CAD without LMCA With Abnormal LV systolic function  A (9)  Indication: 48; Score: 9   Patient Information:   LMCA-CAD  A (9)  Indication: 49; Score: 9   Patient Information:   2V-CAD with prox LAD PCI  A (7)  Indication: 62; Score: 7   Patient Information:   2V-CAD with prox LAD CABG  A (8)  Indication: 62; Score: 8   Patient Information:   3V-CAD without LMCA With Low CAD burden(i.e., 3 focal stenoses, low SYNTAX score) PCI  A (7)  Indication: 63; Score: 7   Patient Information:   3V-CAD without LMCA With Low CAD burden(i.e., 3 focal stenoses, low SYNTAX score) CABG  A (9)  Indication: 63; Score: 9   Patient Information:   3V-CAD without LMCA E06c - Intermediate-high CAD burden (i.e., multiple diffuse lesions, presence of CTO, or high SYNTAX score) PCI  U (4)  Indication: 64; Score: 4   Patient Information:   3V-CAD without LMCA E06c - Intermediate-high CAD burden (i.e., multiple diffuse lesions, presence of CTO, or high SYNTAX score) CABG  A (9)  Indication:  64; Score: 9   Patient Information:   LMCA-CAD With Isolated LMCA stenosis  PCI  U (6)  Indication: 65; Score: 6   Patient Information:   LMCA-CAD With Isolated LMCA stenosis  CABG  A (9)  Indication: 65; Score: 9   Patient Information:   LMCA-CAD Additional CAD, low CAD burden (i.e., 1- to 2-vessel additional involvement, low SYNTAX score) PCI  U (5)  Indication: 66; Score: 5   Patient Information:    LMCA-CAD Additional CAD, low CAD burden (i.e., 1- to 2-vessel additional involvement, low SYNTAX score) CABG  A (9)  Indication: 66; Score: 9   Patient Information:   LMCA-CAD Additional CAD, intermediate-high CAD burden (i.e., 3-vessel involvement, presence of CTO, or high SYNTAX score) PCI  I (3)  Indication: 67; Score: 3   Patient Information:   LMCA-CAD Additional CAD, intermediate-high CAD burden (i.e., 3-vessel involvement, presence of CTO, or high SYNTAX score) CABG  A (9)  Indication: 67; Score: 9     Glenetta Hew

## 2015-10-06 NOTE — Research (Signed)
LEADERS FREE II Informed Consent   Subject Name: Tiffany [name]  Subject met inclusion and exclusion criteria.  The informed consent form, study requirements and expectations were reviewed with the subject and questions and concerns were addressed prior to the signing of the consent form.  The subject verbalized understanding of the trial requirements.  The subject agreed to participate in the LEADERS FREE II trial and signed the informed consent.  The informed consent was obtained prior to performance of any protocol-specific procedures for the subject.  A copy of the signed informed consent was given to the subject and a copy was placed in the subject's medical record.  Marlana Salvage 10/06/2015, 08:40 AM

## 2015-10-06 NOTE — Discharge Instructions (Signed)
Angiogram, Care After °These instructions give you information about caring for yourself after your procedure. Your doctor may also give you more specific instructions. Call your doctor if you have any problems or questions after your procedure.  °HOME CARE °· Take medicines only as told by your doctor. °· Follow your doctor's instructions about: °¨ Care of the area where the tube was inserted. °¨ Bandage (dressing) changes and removal. °· You may shower 24-48 hours after the procedure or as told by your doctor. °· Do not take baths, swim, or use a hot tub until your doctor approves. °· Every day, check the area where the tube was inserted. Watch for: °¨ Redness, swelling, or pain. °¨ Fluid, blood, or pus. °· Do not apply powder or lotion to the site. °· Do not lift anything that is heavier than 10 lb (4.5 kg) for 5 days or as told by your doctor. °· Ask your doctor when you can: °¨ Return to work or school. °¨ Do physical activities or play sports. °¨ Have sex. °· Do not drive or operate heavy machinery for 24 hours or as told by your doctor. °· Have someone with you for the first 24 hours after the procedure. °· Keep all follow-up visits as told by your doctor. This is important. °GET HELP IF: °· You have a fever.   °· You have chills.   °· You have more bleeding from the area where the tube was inserted. Hold pressure on the area. °· You have redness, swelling, or pain in the area where the tube was inserted. °· You have fluid or pus coming from the area. °GET HELP RIGHT AWAY IF:  °· You have a lot of pain in the area where the tube was inserted. °· The area where the tube was inserted is bleeding, and the bleeding does not stop after 30 minutes of holding steady pressure on the area. °· The area near or just beyond the insertion site becomes pale, cool, tingly, or numb. °  °This information is not intended to replace advice given to you by your health care provider. Make sure you discuss any questions you have  with your health care provider. °  °Document Released: 07/26/2008 Document Revised: 05/20/2014 Document Reviewed: 09/30/2012 °Elsevier Interactive Patient Education ©2016 [employer] ° °Radial Site Care °Refer to this sheet in the next few weeks. These instructions provide you with information about caring for yourself after your procedure. Your health care provider may also give you more specific instructions. Your treatment has been planned according to current medical practices, but problems sometimes occur. Call your health care provider if you have any problems or questions after your procedure. °WHAT TO EXPECT AFTER THE PROCEDURE °After your procedure, it is typical to have the following: °· Bruising at the radial site that usually fades within 1-2 weeks. °· Blood collecting in the tissue (hematoma) that may be painful to the touch. It should usually decrease in size and tenderness within 1-2 weeks. °HOME CARE INSTRUCTIONS °· Take medicines only as directed by your health care provider. °· You may shower 24-48 hours after the procedure or as directed by your health care provider. Remove the bandage (dressing) and gently wash the site with plain soap and water. Pat the area dry with a clean towel. Do not rub the site, because this may cause bleeding. °· Do not take baths, swim, or use a hot tub until your health care provider approves. °· Check your insertion site every day for redness,   swelling, or drainage. °· Do not apply powder or lotion to the site. °· Do not flex or bend the affected arm for 24 hours or as directed by your health care provider. °· Do not push or pull heavy objects with the affected arm for 24 hours or as directed by your health care provider. °· Do not lift over 10 lb (4.5 kg) for 5 days after your procedure or as directed by your health care provider. °· Ask your health care provider when it is okay to: °¨ Return to work or school. °¨ Resume usual physical activities or  sports. °¨ Resume sexual activity. °· Do not drive home if you are discharged the same day as the procedure. Have someone else drive you. °· You may drive 24 hours after the procedure unless otherwise instructed by your health care provider. °· Do not operate machinery or power tools for 24 hours after the procedure. °· If your procedure was done as an outpatient procedure, which means that you went home the same day as your procedure, a responsible adult should be with you for the first 24 hours after you arrive home. °· Keep all follow-up visits as directed by your health care provider. This is important. °SEEK MEDICAL CARE IF: °· You have a fever. °· You have chills. °· You have increased bleeding from the radial site. Hold pressure on the site. °SEEK IMMEDIATE MEDICAL CARE IF: °· You have unusual pain at the radial site. °· You have redness, warmth, or swelling at the radial site. °· You have drainage (other than a small amount of blood on the dressing) from the radial site. °· The radial site is bleeding, and the bleeding does not stop after 30 minutes of holding steady pressure on the site. °· Your arm or hand becomes pale, cool, tingly, or numb. °  °This information is not intended to replace advice given to you by your health care provider. Make sure you discuss any questions you have with your health care provider. °  °Document Released: 06/01/2010 Document Revised: 05/20/2014 Document Reviewed: 11/15/2013 °Elsevier Interactive Patient Education ©2016 [employer] ° °

## 2015-10-07 ENCOUNTER — Encounter (HOSPITAL_COMMUNITY): Payer: Self-pay | Admitting: Cardiology

## 2015-10-12 DIAGNOSIS — R5381 Other malaise: Secondary | ICD-10-CM | POA: Diagnosis not present

## 2015-10-12 DIAGNOSIS — D649 Anemia, unspecified: Secondary | ICD-10-CM | POA: Diagnosis not present

## 2015-10-12 DIAGNOSIS — I259 Chronic ischemic heart disease, unspecified: Secondary | ICD-10-CM | POA: Diagnosis not present

## 2015-10-12 DIAGNOSIS — R531 Weakness: Secondary | ICD-10-CM | POA: Diagnosis not present

## 2015-10-12 DIAGNOSIS — D638 Anemia in other chronic diseases classified elsewhere: Secondary | ICD-10-CM | POA: Diagnosis not present

## 2015-10-12 DIAGNOSIS — I10 Essential (primary) hypertension: Secondary | ICD-10-CM | POA: Diagnosis not present

## 2015-10-17 DIAGNOSIS — D638 Anemia in other chronic diseases classified elsewhere: Secondary | ICD-10-CM | POA: Diagnosis not present

## 2015-10-17 DIAGNOSIS — I259 Chronic ischemic heart disease, unspecified: Secondary | ICD-10-CM | POA: Diagnosis not present

## 2015-10-19 NOTE — Progress Notes (Signed)
Addendum to PT note to include G codes.  Collie Siad PT, DPT  Pager: [phone] Phone: [phone]    2015-10-23 0923  PT G-Codes **NOT FOR INPATIENT CLASS**  Functional Assessment Tool Used Clinical Judgement  Functional Limitation Mobility: Walking and moving around  Mobility: Walking and Moving Around Current Status [phone]) CI  Mobility: Walking and Moving Around Goal Status [phone]) CI

## 2015-10-28 ENCOUNTER — Encounter (HOSPITAL_COMMUNITY): Payer: Self-pay

## 2015-10-28 ENCOUNTER — Inpatient Hospital Stay (HOSPITAL_COMMUNITY)
Admission: EM | Admit: 2015-10-28 | Discharge: 2015-11-03 | DRG: 546 | Disposition: A | Discharge status: Home / Self Care | Payer: Medicare Other | Attending: Internal Medicine | Admitting: Internal Medicine

## 2015-10-28 ENCOUNTER — Emergency Department (HOSPITAL_COMMUNITY): Payer: Medicare Other

## 2015-10-28 ENCOUNTER — Other Ambulatory Visit (HOSPITAL_COMMUNITY): Payer: Self-pay

## 2015-10-28 ENCOUNTER — Other Ambulatory Visit: Payer: Self-pay

## 2015-10-28 DIAGNOSIS — J440 Chronic obstructive pulmonary disease with acute lower respiratory infection: Secondary | ICD-10-CM

## 2015-10-28 DIAGNOSIS — Z9841 Cataract extraction status, right eye: Secondary | ICD-10-CM

## 2015-10-28 DIAGNOSIS — M3500 Sicca syndrome, unspecified: Secondary | ICD-10-CM

## 2015-10-28 DIAGNOSIS — Z9181 History of falling: Secondary | ICD-10-CM

## 2015-10-28 DIAGNOSIS — I10 Essential (primary) hypertension: Secondary | ICD-10-CM | POA: Diagnosis present

## 2015-10-28 DIAGNOSIS — G8929 Other chronic pain: Secondary | ICD-10-CM | POA: Diagnosis not present

## 2015-10-28 DIAGNOSIS — N289 Disorder of kidney and ureter, unspecified: Secondary | ICD-10-CM | POA: Diagnosis not present

## 2015-10-28 DIAGNOSIS — K5900 Constipation, unspecified: Secondary | ICD-10-CM | POA: Diagnosis not present

## 2015-10-28 DIAGNOSIS — I248 Other forms of acute ischemic heart disease: Secondary | ICD-10-CM | POA: Diagnosis present

## 2015-10-28 DIAGNOSIS — J449 Chronic obstructive pulmonary disease, unspecified: Secondary | ICD-10-CM | POA: Diagnosis not present

## 2015-10-28 DIAGNOSIS — Z955 Presence of coronary angioplasty implant and graft: Secondary | ICD-10-CM

## 2015-10-28 DIAGNOSIS — I951 Orthostatic hypotension: Secondary | ICD-10-CM | POA: Diagnosis not present

## 2015-10-28 DIAGNOSIS — Z961 Presence of intraocular lens: Secondary | ICD-10-CM | POA: Diagnosis present

## 2015-10-28 DIAGNOSIS — D649 Anemia, unspecified: Secondary | ICD-10-CM | POA: Diagnosis present

## 2015-10-28 DIAGNOSIS — E785 Hyperlipidemia, unspecified: Secondary | ICD-10-CM | POA: Diagnosis not present

## 2015-10-28 DIAGNOSIS — S299XXA Unspecified injury of thorax, initial encounter: Secondary | ICD-10-CM | POA: Diagnosis not present

## 2015-10-28 DIAGNOSIS — R296 Repeated falls: Secondary | ICD-10-CM | POA: Diagnosis not present

## 2015-10-28 DIAGNOSIS — I2510 Atherosclerotic heart disease of native coronary artery without angina pectoris: Secondary | ICD-10-CM

## 2015-10-28 DIAGNOSIS — D638 Anemia in other chronic diseases classified elsewhere: Secondary | ICD-10-CM | POA: Diagnosis present

## 2015-10-28 DIAGNOSIS — I472 Ventricular tachycardia: Secondary | ICD-10-CM | POA: Diagnosis not present

## 2015-10-28 DIAGNOSIS — E039 Hypothyroidism, unspecified: Secondary | ICD-10-CM | POA: Diagnosis present

## 2015-10-28 DIAGNOSIS — M545 Low back pain: Secondary | ICD-10-CM | POA: Diagnosis not present

## 2015-10-28 DIAGNOSIS — N950 Postmenopausal bleeding: Secondary | ICD-10-CM

## 2015-10-28 DIAGNOSIS — Z7982 Long term (current) use of aspirin: Secondary | ICD-10-CM

## 2015-10-28 DIAGNOSIS — R739 Hyperglycemia, unspecified: Secondary | ICD-10-CM | POA: Diagnosis not present

## 2015-10-28 DIAGNOSIS — M329 Systemic lupus erythematosus, unspecified: Principal | ICD-10-CM | POA: Diagnosis present

## 2015-10-28 DIAGNOSIS — Z9842 Cataract extraction status, left eye: Secondary | ICD-10-CM

## 2015-10-28 DIAGNOSIS — I5022 Chronic systolic (congestive) heart failure: Secondary | ICD-10-CM | POA: Diagnosis present

## 2015-10-28 DIAGNOSIS — R0789 Other chest pain: Secondary | ICD-10-CM

## 2015-10-28 DIAGNOSIS — I252 Old myocardial infarction: Secondary | ICD-10-CM

## 2015-10-28 DIAGNOSIS — Z888 Allergy status to other drugs, medicaments and biological substances status: Secondary | ICD-10-CM

## 2015-10-28 DIAGNOSIS — I130 Hypertensive heart and chronic kidney disease with heart failure and stage 1 through stage 4 chronic kidney disease, or unspecified chronic kidney disease: Secondary | ICD-10-CM | POA: Diagnosis present

## 2015-10-28 DIAGNOSIS — I255 Ischemic cardiomyopathy: Secondary | ICD-10-CM | POA: Diagnosis present

## 2015-10-28 DIAGNOSIS — R51 Headache: Secondary | ICD-10-CM | POA: Diagnosis not present

## 2015-10-28 DIAGNOSIS — Z7901 Long term (current) use of anticoagulants: Secondary | ICD-10-CM

## 2015-10-28 DIAGNOSIS — N184 Chronic kidney disease, stage 4 (severe): Secondary | ICD-10-CM | POA: Diagnosis not present

## 2015-10-28 DIAGNOSIS — S20211A Contusion of right front wall of thorax, initial encounter: Secondary | ICD-10-CM | POA: Diagnosis not present

## 2015-10-28 DIAGNOSIS — R634 Abnormal weight loss: Secondary | ICD-10-CM

## 2015-10-28 DIAGNOSIS — Z87891 Personal history of nicotine dependence: Secondary | ICD-10-CM

## 2015-10-28 DIAGNOSIS — E860 Dehydration: Secondary | ICD-10-CM | POA: Diagnosis not present

## 2015-10-28 DIAGNOSIS — D696 Thrombocytopenia, unspecified: Secondary | ICD-10-CM | POA: Diagnosis not present

## 2015-10-28 DIAGNOSIS — F329 Major depressive disorder, single episode, unspecified: Secondary | ICD-10-CM | POA: Diagnosis present

## 2015-10-28 DIAGNOSIS — R55 Syncope and collapse: Secondary | ICD-10-CM | POA: Diagnosis present

## 2015-10-28 DIAGNOSIS — S0990XA Unspecified injury of head, initial encounter: Secondary | ICD-10-CM | POA: Diagnosis not present

## 2015-10-28 DIAGNOSIS — R002 Palpitations: Secondary | ICD-10-CM

## 2015-10-28 DIAGNOSIS — M069 Rheumatoid arthritis, unspecified: Secondary | ICD-10-CM | POA: Diagnosis not present

## 2015-10-28 DIAGNOSIS — N200 Calculus of kidney: Secondary | ICD-10-CM | POA: Diagnosis not present

## 2015-10-28 DIAGNOSIS — R0609 Other forms of dyspnea: Secondary | ICD-10-CM

## 2015-10-28 DIAGNOSIS — Y92009 Unspecified place in unspecified non-institutional (private) residence as the place of occurrence of the external cause: Secondary | ICD-10-CM

## 2015-10-28 DIAGNOSIS — Z8572 Personal history of non-Hodgkin lymphomas: Secondary | ICD-10-CM

## 2015-10-28 DIAGNOSIS — W19XXXA Unspecified fall, initial encounter: Secondary | ICD-10-CM

## 2015-10-28 DIAGNOSIS — T43015A Adverse effect of tricyclic antidepressants, initial encounter: Secondary | ICD-10-CM | POA: Diagnosis present

## 2015-10-28 DIAGNOSIS — R9439 Abnormal result of other cardiovascular function study: Secondary | ICD-10-CM

## 2015-10-28 DIAGNOSIS — R0781 Pleurodynia: Secondary | ICD-10-CM | POA: Diagnosis not present

## 2015-10-28 DIAGNOSIS — J209 Acute bronchitis, unspecified: Secondary | ICD-10-CM

## 2015-10-28 DIAGNOSIS — I214 Non-ST elevation (NSTEMI) myocardial infarction: Secondary | ICD-10-CM

## 2015-10-28 DIAGNOSIS — Z9049 Acquired absence of other specified parts of digestive tract: Secondary | ICD-10-CM

## 2015-10-28 DIAGNOSIS — Z720 Tobacco use: Secondary | ICD-10-CM

## 2015-10-28 DIAGNOSIS — D631 Anemia in chronic kidney disease: Secondary | ICD-10-CM | POA: Diagnosis present

## 2015-10-28 LAB — COMPREHENSIVE METABOLIC PANEL
ALT: 20 U/L (ref 14–54)
AST: 20 U/L (ref 15–41)
Albumin: 3 g/dL — ABNORMAL LOW (ref 3.5–5.0)
Alkaline Phosphatase: 84 U/L (ref 38–126)
Anion gap: 4 — ABNORMAL LOW (ref 5–15)
BILIRUBIN TOTAL: 0.6 mg/dL (ref 0.3–1.2)
BUN: 43 mg/dL — AB (ref 6–20)
CHLORIDE: 111 mmol/L (ref 101–111)
CO2: 18 mmol/L — ABNORMAL LOW (ref 22–32)
CREATININE: 1.89 mg/dL — AB (ref 0.44–1.00)
Calcium: 8.9 mg/dL (ref 8.9–10.3)
GFR, EST AFRICAN AMERICAN: 29 mL/min — AB (ref >60)
GFR, EST NON AFRICAN AMERICAN: 25 mL/min — AB (ref >60)
Glucose, Bld: 116 mg/dL — ABNORMAL HIGH (ref 65–99)
POTASSIUM: 3.9 mmol/L (ref 3.5–5.1)
SODIUM: 133 mmol/L — AB (ref 135–145)
TOTAL PROTEIN: 7.5 g/dL (ref 6.5–8.1)

## 2015-10-28 LAB — URINALYSIS, ROUTINE W REFLEX MICROSCOPIC
Bilirubin Urine: NEGATIVE
Glucose, UA: NEGATIVE mg/dL
Hgb urine dipstick: NEGATIVE
Ketones, ur: NEGATIVE mg/dL
NITRITE: NEGATIVE
PROTEIN: 30 mg/dL — AB
SPECIFIC GRAVITY, URINE: 1.02 (ref 1.005–1.030)
pH: 6 (ref 5.0–8.0)

## 2015-10-28 LAB — CBC WITH DIFFERENTIAL/PLATELET
BASOS ABS: 0 10*3/uL (ref 0.0–0.1)
Basophils Relative: 0 %
EOS ABS: 0 10*3/uL (ref 0.0–0.7)
EOS PCT: 1 %
HCT: 23.8 % — ABNORMAL LOW (ref 36.0–46.0)
HEMOGLOBIN: 7.9 g/dL — AB (ref 12.0–15.0)
Lymphocytes Relative: 20 %
Lymphs Abs: 1 10*3/uL (ref 0.7–4.0)
MCH: 30.4 pg (ref 26.0–34.0)
MCHC: 33.2 g/dL (ref 30.0–36.0)
MCV: 91.5 fL (ref 78.0–100.0)
Monocytes Absolute: 0.4 10*3/uL (ref 0.1–1.0)
Monocytes Relative: 9 %
NEUTROS PCT: 70 %
Neutro Abs: 3.4 10*3/uL (ref 1.7–7.7)
PLATELETS: 179 10*3/uL (ref 150–400)
RBC: 2.6 MIL/uL — AB (ref 3.87–5.11)
RDW: 18.5 % — ABNORMAL HIGH (ref 11.5–15.5)
WBC: 4.9 10*3/uL (ref 4.0–10.5)

## 2015-10-28 LAB — URINE MICROSCOPIC-ADD ON

## 2015-10-28 LAB — POC OCCULT BLOOD, ED: FECAL OCCULT BLD: NEGATIVE

## 2015-10-28 MED ORDER — LATANOPROST 0.005 % OP SOLN
1.0000 [drp] | Freq: Every day | OPHTHALMIC | Status: DC
Start: 2015-10-28 — End: 2015-11-03
  Administered 2015-10-29 – 2015-11-02 (×5): 1 [drp] via OPHTHALMIC
  Filled 2015-10-28: qty 2.5

## 2015-10-28 MED ORDER — NITROGLYCERIN 0.4 MG SL SUBL: 0.4000 mg | SUBLINGUAL_TABLET | SUBLINGUAL | Status: DC | PRN

## 2015-10-28 MED ORDER — HYDROCODONE-ACETAMINOPHEN 5-325 MG PO TABS
1.0000 | ORAL_TABLET | Freq: Three times a day (TID) | ORAL | Status: DC | PRN
  Administered 2015-10-28 – 2015-11-02 (×9): 1 via ORAL
  Filled 2015-10-28 (×10): qty 1

## 2015-10-28 MED ORDER — LEVOTHYROXINE SODIUM 25 MCG PO TABS
25.0000 ug | ORAL_TABLET | Freq: Every day | ORAL | Status: DC
  Administered 2015-10-29 – 2015-11-03 (×6): 25 ug via ORAL
  Filled 2015-10-28 (×6): qty 1

## 2015-10-28 MED ORDER — POTASSIUM CHLORIDE CRYS ER 10 MEQ PO TBCR
10.0000 meq | EXTENDED_RELEASE_TABLET | Freq: Three times a day (TID) | ORAL | Status: DC
Start: 2015-10-28 — End: 2015-11-03
  Administered 2015-10-29 – 2015-11-03 (×16): 10 meq via ORAL
  Filled 2015-10-28 (×21): qty 1

## 2015-10-28 MED ORDER — ATORVASTATIN CALCIUM 40 MG PO TABS
40.0000 mg | ORAL_TABLET | Freq: Every day | ORAL | Status: DC
  Administered 2015-10-29 – 2015-11-02 (×5): 40 mg via ORAL
  Filled 2015-10-28 (×5): qty 1

## 2015-10-28 MED ORDER — ALBUTEROL SULFATE (2.5 MG/3ML) 0.083% IN NEBU: 2.5000 mg | INHALATION_SOLUTION | Freq: Four times a day (QID) | RESPIRATORY_TRACT | Status: DC | PRN

## 2015-10-28 MED ORDER — SODIUM CHLORIDE 0.9 % IV SOLN: 250.0000 mL | INTRAVENOUS | Status: DC | PRN

## 2015-10-28 MED ORDER — HYDROXYCHLOROQUINE SULFATE 200 MG PO TABS
200.0000 mg | ORAL_TABLET | Freq: Every day | ORAL | Status: DC
  Administered 2015-10-29 – 2015-11-03 (×6): 200 mg via ORAL
  Filled 2015-10-28 (×11): qty 1

## 2015-10-28 MED ORDER — FOLIC ACID 1 MG PO TABS
1.0000 mg | ORAL_TABLET | Freq: Every morning | ORAL | Status: DC
  Administered 2015-10-29 – 2015-11-03 (×6): 1 mg via ORAL
  Filled 2015-10-28 (×6): qty 1

## 2015-10-28 MED ORDER — CHOLECALCIFEROL 10 MCG (400 UNIT) PO TABS
400.0000 [IU] | ORAL_TABLET | Freq: Every evening | ORAL | Status: DC
  Administered 2015-10-29 – 2015-11-02 (×5): 400 [IU] via ORAL
  Filled 2015-10-28 (×5): qty 1

## 2015-10-28 MED ORDER — ACETAMINOPHEN 650 MG RE SUPP: 650.0000 mg | Freq: Four times a day (QID) | RECTAL | Status: DC | PRN

## 2015-10-28 MED ORDER — SODIUM CHLORIDE 0.9% FLUSH
3.0000 mL | Freq: Two times a day (BID) | INTRAVENOUS | Status: DC
  Administered 2015-10-28 – 2015-11-02 (×6): 3 mL via INTRAVENOUS

## 2015-10-28 MED ORDER — ACETAMINOPHEN 325 MG PO TABS
650.0000 mg | ORAL_TABLET | Freq: Four times a day (QID) | ORAL | Status: DC | PRN
  Administered 2015-10-30: 650 mg via ORAL
  Filled 2015-10-28: qty 2

## 2015-10-28 MED ORDER — SODIUM CHLORIDE 0.9% FLUSH
3.0000 mL | Freq: Two times a day (BID) | INTRAVENOUS | Status: DC
  Administered 2015-10-28 – 2015-11-03 (×7): 3 mL via INTRAVENOUS

## 2015-10-28 MED ORDER — ALPRAZOLAM 0.5 MG PO TABS
0.5000 mg | ORAL_TABLET | Freq: Three times a day (TID) | ORAL | Status: DC
  Administered 2015-10-28 – 2015-11-03 (×17): 0.5 mg via ORAL
  Filled 2015-10-28 (×17): qty 1

## 2015-10-28 MED ORDER — OCUVITE-LUTEIN PO CAPS
ORAL_CAPSULE | Freq: Every day | ORAL | Status: DC
  Administered 2015-10-29 – 2015-11-03 (×6): 1 via ORAL
  Filled 2015-10-28 (×6): qty 1

## 2015-10-28 MED ORDER — CYCLOSPORINE 0.05 % OP EMUL
1.0000 [drp] | Freq: Two times a day (BID) | OPHTHALMIC | Status: DC
  Administered 2015-10-29 – 2015-11-03 (×10): 1 [drp] via OPHTHALMIC
  Filled 2015-10-28 (×20): qty 1

## 2015-10-28 MED ORDER — ASPIRIN EC 81 MG PO TBEC
81.0000 mg | DELAYED_RELEASE_TABLET | Freq: Every day | ORAL | Status: DC
  Administered 2015-10-29 – 2015-11-03 (×6): 81 mg via ORAL
  Filled 2015-10-28 (×6): qty 1

## 2015-10-28 MED ORDER — PANTOPRAZOLE SODIUM 40 MG PO TBEC
40.0000 mg | DELAYED_RELEASE_TABLET | Freq: Every day | ORAL | Status: DC
  Administered 2015-10-29 – 2015-11-03 (×6): 40 mg via ORAL
  Filled 2015-10-28 (×6): qty 1

## 2015-10-28 MED ORDER — SODIUM CHLORIDE 0.9 % IV SOLN
INTRAVENOUS | Status: DC
  Administered 2015-10-28: 23:00:00 via INTRAVENOUS

## 2015-10-28 MED ORDER — FENTANYL CITRATE (PF) 100 MCG/2ML IJ SOLN
25.0000 ug | Freq: Once | INTRAMUSCULAR | Status: AC
  Administered 2015-10-28: 25 ug via INTRAVENOUS
  Filled 2015-10-28: qty 2

## 2015-10-28 MED ORDER — RALOXIFENE HCL 60 MG PO TABS
60.0000 mg | ORAL_TABLET | Freq: Every morning | ORAL | Status: DC
  Administered 2015-10-29 – 2015-11-03 (×6): 60 mg via ORAL
  Filled 2015-10-28 (×6): qty 1

## 2015-10-28 MED ORDER — SODIUM CHLORIDE 0.9 % IV BOLUS (SEPSIS)
500.0000 mL | Freq: Once | INTRAVENOUS | Status: AC
  Administered 2015-10-28: 500 mL via INTRAVENOUS

## 2015-10-28 MED ORDER — AMITRIPTYLINE HCL 25 MG PO TABS
100.0000 mg | ORAL_TABLET | Freq: Every day | ORAL | Status: DC
  Administered 2015-10-28 – 2015-10-31 (×4): 100 mg via ORAL
  Filled 2015-10-28 (×3): qty 4

## 2015-10-28 MED ORDER — POLYSACCHARIDE IRON COMPLEX 150 MG PO CAPS
150.0000 mg | ORAL_CAPSULE | Freq: Every day | ORAL | Status: DC
  Administered 2015-10-29 – 2015-11-03 (×6): 150 mg via ORAL
  Filled 2015-10-28 (×6): qty 1

## 2015-10-28 MED ORDER — DOCUSATE SODIUM 100 MG PO CAPS: 100.0000 mg | ORAL_CAPSULE | Freq: Two times a day (BID) | ORAL | Status: DC | PRN

## 2015-10-28 MED ORDER — SODIUM BICARBONATE 650 MG PO TABS
650.0000 mg | ORAL_TABLET | Freq: Three times a day (TID) | ORAL | Status: DC
  Administered 2015-10-29 – 2015-11-03 (×16): 650 mg via ORAL
  Filled 2015-10-28 (×16): qty 1

## 2015-10-28 MED ORDER — SODIUM CHLORIDE 0.9% FLUSH: 3.0000 mL | INTRAVENOUS | Status: DC | PRN

## 2015-10-28 MED ORDER — CARVEDILOL 3.125 MG PO TABS
3.1250 mg | ORAL_TABLET | Freq: Two times a day (BID) | ORAL | Status: DC
  Administered 2015-10-29 – 2015-11-03 (×5): 3.125 mg via ORAL
  Filled 2015-10-28 (×5): qty 1

## 2015-10-28 MED ORDER — FUROSEMIDE 20 MG PO TABS: 20.0000 mg | ORAL_TABLET | Freq: Every day | ORAL | Status: DC | PRN

## 2015-10-28 MED ORDER — MEDROXYPROGESTERONE ACETATE 10 MG PO TABS
10.0000 mg | ORAL_TABLET | Freq: Every day | ORAL | Status: DC
  Administered 2015-10-29 – 2015-11-03 (×6): 10 mg via ORAL
  Filled 2015-10-28 (×10): qty 1

## 2015-10-28 MED ORDER — AMITRIPTYLINE HCL 25 MG PO TABS
100.0000 mg | ORAL_TABLET | Freq: Every day | ORAL | Status: DC
  Filled 2015-10-28: qty 4

## 2015-10-28 NOTE — ED Notes (Signed)
PT also reports her speech doesn't sound the same for the past 3 or 4 days.  Pt c/o pain in r side since falling one day this week.

## 2015-10-28 NOTE — ED Notes (Signed)
When assisting pt to wheelchair to go to restroom pt c/o right arm pain. Tourniquet from IV start found and removed, pt's arm is red and tender to touch. MD Alvino Chapel notified of event.

## 2015-10-28 NOTE — ED Notes (Signed)
Pt given water and crackers at this time.

## 2015-10-28 NOTE — H&P (Signed)
TRH H&P   Patient Demographics:    Tiffany [name], is a 75 y.o. female  MRN: [id number]   DOB - [birth year]  Admit Date - 10/28/2015  Outpatient Primary MD for the patient is Cleda Mccreedy, MD  Referring MD/NP/PA: Davonna Belling  Outpatient Specialists: Mali Hilty (cardiologist), Erling Cruz   Patient coming from: home  Chief Complaint  Patient presents with  . Fatigue      HPI:    Tiffany [name]  is a 75 y.o. female, w hx of CAD s/p stent, Hypertension, Hyperlipidemia, Cardiomyopathy (EF 35-45%), Sjogren, RA, orthostatic hypotension, chronic anemia, apparently c/o near syncope while at home this evening about 5pm prior to coming to the ED,  Felt weak and lightheaded.  Denies cp, palp, sob, focal neurological symptoms.    In ED, CT brain negative for any acute process.  EKG =>st at 110, nl axis,  Pt was noted to be anemic with hgb 7.9 which was worse than before. Pt also noted to have ckd stage3.  Pt will be admitted for observation of near syncope and anemia.    Review of systems:    In addition to the HPI above,  No Fever-chills, No Headache, No changes with Vision or hearing, No problems swallowing food or Liquids, No Chest pain, Cough or Shortness of Breath, No Abdominal pain, No Nausea or Vommitting, Bowel movements are regular, No Blood in stool or Urine, No dysuria, No new skin rashes or bruises, No new joints pains-aches,  No new weakness, tingling, numbness in any extremity, No recent weight gain or loss, No polyuria, polydypsia or polyphagia, No significant Mental Stressors.  A full 10 point Review of Systems was done, except as stated above, all other Review of Systems were negative.   With Past History of the following :    Past Medical History  Diagnosis Date  . Lymphoma ([place])   . Systemic lupus erythematosus ([place])   . Depression   .  Hyperlipidemia   . Sjogren's syndrome ([place])   . Orthostatic hypotension   . Dysphagia   . Syncope   . Anemia   . PMB (postmenopausal bleeding) 06/12/2015  . CKD (chronic kidney disease) stage 3, GFR 30-59 ml/min   . Kidney stones   . Heart murmur     "one dr told me I have one"  . Myocardial infarction ([place]) 2006  . CAD (coronary artery disease)   . COPD (chronic obstructive pulmonary disease) ([place]) dx'd 09/28/2015  . Pneumonia 09/28/2015  . History of blood transfusion 09/2015    "blood was low"  . Rheumatoid arthritis ([place])     "back" (09/28/2015)  . Chronic lower back pain       Past Surgical History  Procedure Laterality Date  . Mass excision Left ~ 2000    Lymphoma  . Cataract extraction w/ intraocular lens  implant, bilateral Bilateral   .  Breast biopsy Left   . Tonsillectomy Left ~ 2000  . Laparoscopic cholecystectomy    . Lymph node dissection Left ~ 2000    "neck"  . Eye surgery    . Retinal detachment surgery Right   . Lithotripsy  X 3  . Coronary angioplasty with stent placement  2006    DES to mid RCA   . Dilation and curettage of uterus    . Tubal ligation    . Cardiac catheterization N/A 10/06/2015    Procedure: Right/Left Heart Cath and Coronary Angiography;  Surgeon: Leonie Man, MD;  Location: [facility];  Service: Cardiovascular;  Laterality: N/A;      Social History:     Social History  Substance Use Topics  . Smoking status: Former Smoker -- 0.25 packs/day for 23 years    Types: Cigarettes    Start date: 07/14/1986    Quit date: 09/18/2015  . Smokeless tobacco: Never Used  . Alcohol Use: No     Lives - at home  Mobility - walks without assistance     Family History :     Family History  Problem Relation Age of Onset  . Coronary artery disease Father   . Coronary artery disease Mother   . Liver disease Mother   . Esophageal cancer Brother   . Breast cancer Sister       Home Medications:   Prior to Admission  medications   Medication Sig Start Date End Date Taking? Authorizing Provider  acetaminophen (TYLENOL) 325 MG tablet Take 2 tablets (650 mg total) by mouth every 6 (six) hours as needed for mild pain (or Fever >/= 101). 09/18/15  Yes Robbie Lis, MD  albuterol (PROVENTIL) (2.5 MG/3ML) 0.083% nebulizer solution Take 3 mLs (2.5 mg total) by nebulization every 6 (six) hours as needed for wheezing or shortness of breath. 09/18/15  Yes Robbie Lis, MD  ALPRAZolam Duanne Moron) 0.5 MG tablet Take 0.5 mg by mouth 3 (three) times daily.    Yes Historical Provider, MD  amitriptyline (ELAVIL) 100 MG tablet Take 100 mg by mouth daily. 07/21/14  Yes Historical Provider, MD  Artificial Tear Solution (SOOTHE XP OP) Apply 1 drop to eye 3 (three) times daily.    Yes Historical Provider, MD  aspirin 81 MG tablet Take 81 mg by mouth daily.   Yes Historical Provider, MD  atorvastatin (LIPITOR) 40 MG tablet Take 1 tablet (40 mg total) by mouth at bedtime. Patient taking differently: Take 40 mg by mouth every morning.  04/21/13  Yes Lendon Colonel, NP  Biotin 5000 MCG CAPS Take 5,000 mcg by mouth daily.   Yes Historical Provider, MD  carvedilol (COREG) 3.125 MG tablet Take 1 tablet (3.125 mg total) by mouth 2 (two) times daily with a meal. 09/18/15  Yes Robbie Lis, MD  Cholecalciferol (VITAMIN D) 400 UNITS capsule Take 400 Units by mouth every evening.    Yes Historical Provider, MD  clobetasol cream (TEMOVATE) [id number] % Apply 1 application topically 2 (two) times daily.   Yes Historical Provider, MD  cycloSPORINE (RESTASIS) 0.05 % ophthalmic emulsion Place 1 drop into both eyes 2 (two) times daily.   Yes Historical Provider, MD  docusate sodium (COLACE) 100 MG capsule Take 100 mg by mouth 2 (two) times daily as needed for mild constipation.   Yes Historical Provider, MD  FERREX 150 150 MG capsule Take 150 mg by mouth daily. 10/13/15  Yes Historical Provider, MD  fludrocortisone (FLORINEF)  0.1 MG tablet Take 0.1 mg by mouth  daily.   Yes Historical Provider, MD  folic acid (FOLVITE) 1 MG tablet Take 1 mg by mouth every morning.    Yes Historical Provider, MD  furosemide (LASIX) 20 MG tablet Take 1 tablet by mouth daily as needed for fluid.  10/11/15  Yes Historical Provider, MD  HYDROcodone-acetaminophen (NORCO/VICODIN) 5-325 MG per tablet Take 1 tablet by mouth 3 (three) times daily as needed for moderate pain.    Yes Historical Provider, MD  hydroxychloroquine (PLAQUENIL) 200 MG tablet Take 200 mg by mouth daily. To be taken with food or milk 02/07/13  Yes Historical Provider, MD  KLOR-CON 10 10 MEQ tablet Take 10 mEq by mouth 3 (three) times daily. 10/07/15  Yes Historical Provider, MD  levothyroxine (SYNTHROID, LEVOTHROID) 25 MCG tablet Take 25 mcg by mouth every morning.    Yes Historical Provider, MD  medroxyPROGESTERone (PROVERA) 10 MG tablet Take 1 tablet (10 mg total) by mouth daily. 06/21/15  Yes Florian Buff, MD  Multiple Vitamin (MULTIVITAMIN) tablet Take 1 tablet by mouth daily.   Yes Historical Provider, MD  Multiple Vitamins-Minerals (PRESERVISION/LUTEIN PO) Take 1 capsule by mouth daily.   Yes Historical Provider, MD  nitroGLYCERIN (NITROSTAT) 0.4 MG SL tablet Place 1 tablet (0.4 mg total) under the tongue every 5 (five) minutes as needed. Patient taking differently: Place 0.4 mg under the tongue every 5 (five) minutes as needed for chest pain.  05/20/11  Yes Lendon Colonel, NP  omeprazole (PRILOSEC) 20 MG capsule Take 20 mg by mouth daily.    Yes Historical Provider, MD  raloxifene (EVISTA) 60 MG tablet Take 60 mg by mouth every morning.    Yes Historical Provider, MD  sodium bicarbonate 325 MG tablet Take 650 mg by mouth 3 (three) times daily.    Yes Historical Provider, MD  Travoprost, BAK Free, (TRAVATAN) 0.004 % SOLN ophthalmic solution Place 1 drop into both eyes at bedtime.   Yes Historical Provider, MD  levofloxacin (LEVAQUIN) 750 MG tablet Take 1 tablet (750 mg total) by mouth every other  day. Patient not taking: Reported on 10/28/2015 09/30/15   Cristal Ford, DO     Allergies:     Allergies  Allergen Reactions  . Florinef [Fludrocortisone Acetate]     Malaise: Discomfort   . Iohexol Other (See Comments)    Pt. States that Dr. Florene Glen her kidney Dr. Does not want her to receive iv dye due to increased risk of kidney failure.   . Sulfonamide Derivatives Hives  . Tape Rash    ADHESIVE     Physical Exam:   Vitals  Blood pressure 135/67, pulse 82, temperature 98.4 F (36.9 C), temperature source Oral, resp. rate 15, height 5\' 10"  (1.778 m), weight 53.978 kg (119 lb), SpO2 100 %.   1. General  lying in bed in NAD,    2. Normal affect and insight, Not Suicidal or Homicidal, Awake Alert, Oriented X 3.  3. No F.N deficits, ALL C.Nerves Intact, Strength 5/5 all 4 extremities, Sensation intact all 4 extremities, Plantars down going.  4. Ears and Eyes appear Normal, Conjunctivae clear, PERRLA. Moist Oral Mucosa.  5. Supple Neck, No JVD, No cervical lymphadenopathy appriciated, No Carotid Bruits.  6. Symmetrical Chest wall movement, Good air movement bilaterally, CTAB.  7. RRR, No Gallops, no Rubs,   1/6 sem apex, No Parasternal Heave.  8. Positive Bowel Sounds, Abdomen Soft, No tenderness, No organomegaly appriciated,No rebound -guarding or rigidity.  9.  No Cyanosis, Normal Skin Turgor, No Skin Rash or Bruise.  10. Good muscle tone,  joints appear normal , no effusions, Normal ROM.  11. No Palpable Lymph Nodes in Neck or Axillae     Data Review:    CBC  Recent Labs Lab 10/28/15 1855  WBC 4.9  HGB 7.9*  HCT 23.8*  PLT 179  MCV 91.5  MCH 30.4  MCHC 33.2  RDW 18.5*  LYMPHSABS 1.0  MONOABS 0.4  EOSABS 0.0  BASOSABS 0.0   ------------------------------------------------------------------------------------------------------------------  Chemistries   Recent Labs Lab 10/28/15 1855  NA 133*  K 3.9  CL 111  CO2 18*  GLUCOSE 116*  BUN 43*   CREATININE 1.89*  CALCIUM 8.9  AST 20  ALT 20  ALKPHOS 84  BILITOT 0.6   ------------------------------------------------------------------------------------------------------------------ estimated creatinine clearance is 22.6 mL/min (by C-G formula based on Cr of 1.89). ------------------------------------------------------------------------------------------------------------------ No results for input(s): TSH, T4TOTAL, T3FREE, THYROIDAB in the last 72 hours.  Invalid input(s): FREET3  Coagulation profile No results for input(s): INR, PROTIME in the last 168 hours. ------------------------------------------------------------------------------------------------------------------- No results for input(s): DDIMER in the last 72 hours. -------------------------------------------------------------------------------------------------------------------  Cardiac Enzymes No results for input(s): CKMB, TROPONINI, MYOGLOBIN in the last 168 hours.  Invalid input(s): CK ------------------------------------------------------------------------------------------------------------------    Component Value Date/Time   BNP 187.6* 09/16/2015 1115     ---------------------------------------------------------------------------------------------------------------  Urinalysis    Component Value Date/Time   COLORURINE YELLOW 10/28/2015 2023   APPEARANCEUR CLEAR 10/28/2015 2023   LABSPEC 1.020 10/28/2015 2023   PHURINE 6.0 10/28/2015 2023   GLUCOSEU NEGATIVE 10/28/2015 2023   HGBUR NEGATIVE 10/28/2015 2023   BILIRUBINUR NEGATIVE 10/28/2015 2023   KETONESUR NEGATIVE 10/28/2015 2023   PROTEINUR 30* 10/28/2015 2023   UROBILINOGEN 0.2 11/03/2014 1100   NITRITE NEGATIVE 10/28/2015 2023   LEUKOCYTESUR TRACE* 10/28/2015 2023    ----------------------------------------------------------------------------------------------------------------   Imaging Results:    Dg Ribs Unilateral W/chest  Right  10/28/2015  CLINICAL DATA:  Generalized right rib pain after fall 2 days prior. Difficulty speaking. EXAM: RIGHT RIBS AND CHEST - 3+ VIEW COMPARISON:  Chest radiograph 09/28/2015 FINDINGS: No evidence of acute right rib fracture. Cortical margins are intact. The lungs remain hyperinflated. No pleural effusion, pneumothorax or new focal airspace opacity. Bibasilar opacities, left greater than right, stable and likely scarring. IMPRESSION: No evidence of acute right rib fracture or acute intrathoracic process. Electronically Signed   By: Jeb Levering M.D.   On: 10/28/2015 20:49   Ct Head Wo Contrast  10/28/2015  CLINICAL DATA:  Fall, patient hit forehead 3 days ago. Complains of frontal head pain and headache intermittently. Pt states pain to right side of ribs, believes she "cracked a rib" when she fell 3 days ago. Pain with deep inhalation. EXAM: CT HEAD WITHOUT CONTRAST TECHNIQUE: Contiguous axial images were obtained from the base of the skull through the vertex without intravenous contrast. COMPARISON:  04/13/2011 FINDINGS: The ventricles are normal in configuration there is mild ventricular enlargement reflecting age related volume loss. There are no parenchymal masses or mass effect. 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 There is no evidence of a recent infarct. There are no extra-axial masses or abnormal fluid collections. There is no intracranial hemorrhage. Tendons secretions are noted in the right sphenoid sinus. Remaining sinuses are clear. Clear mastoid air cells. No skull fracture. IMPRESSION: 1. No acute intracranial abnormalities. 2. 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 Electronically Signed  By: Lajean Manes M.D.   On: 10/28/2015 20:53       Assessment & Plan:    Active Problems:   Anemia   Dehydration   Near syncope   CKD (chronic kidney disease) stage 4, GFR 15-29 ml/min (HCC)    1. Near  syncope Possibly due to anemia, vs orthostasis Check orthostatic bp Tele Trop iq6h x3  2. Anemia Check celiac panel, ferritin, iron, tibc, folic acid, tsh, spep, upep  3.  CKD stage4 Check cmp in am  4.  Hyperglycemia Check hga1c in am  DVT Prophylaxis - SCDs   AM Labs Ordered, also please review Full Orders  Family Communication: Admission, patients condition and plan of care including tests being ordered have been discussed with the patient  who indicate understanding and agree with the plan and Code Status.  Code Status FULL CODE  Likely DC to  home  Condition GUARDED    Consults called:   Admission status: observation  Time spent in minutes : 45 minutes   Jani Gravel M.D on 10/28/2015 at 9:46 PM  Between 7am to 7pm - Pager - [phone]. After 7pm go to [email or web address] - password [email or web address]  [facility] - Office  [phone]

## 2015-10-28 NOTE — ED Notes (Signed)
Pt states pain to right side of ribs, believes she "cracked a rib" when she fell 3 days ago. Pain with deep inhalation.

## 2015-10-28 NOTE — ED Provider Notes (Signed)
CSN: [id number]     Arrival date & time 10/28/15  1824 History   First MD Initiated Contact with Patient 10/28/15 1920     Chief Complaint  Patient presents with  . Fatigue      The history is provided by the patient.  Patient presents with generalized weakness and fatigue. States she has not been doing that well since she got out of the hospital a couple weeks ago. She's had a few falls. Has some pain in her right midchest from the most recent fall. Today has been having difficulty speaking and this had some difficulty finding words. No headache. She's been eating and drinking less. She's had no appetite. No blood in the stool. She has had a blood transfusion on she was in the hospital.   Past Medical History  Diagnosis Date  . Lymphoma ([place])   . Systemic lupus erythematosus ([place])   . Depression   . Hyperlipidemia   . Sjogren's syndrome ([place])   . Orthostatic hypotension   . Dysphagia   . Syncope   . Anemia   . PMB (postmenopausal bleeding) 06/12/2015  . CKD (chronic kidney disease) stage 3, GFR 30-59 ml/min   . Kidney stones   . Heart murmur     "one dr told me I have one"  . Myocardial infarction ([place]) 2006  . CAD (coronary artery disease)   . COPD (chronic obstructive pulmonary disease) ([place]) dx'd 09/28/2015  . Pneumonia 09/28/2015  . History of blood transfusion 09/2015    "blood was low"  . Rheumatoid arthritis ([place])     "back" (09/28/2015)  . Chronic lower back pain    Past Surgical History  Procedure Laterality Date  . Mass excision Left ~ 2000    Lymphoma  . Cataract extraction w/ intraocular lens  implant, bilateral Bilateral   . Breast biopsy Left   . Tonsillectomy Left ~ 2000  . Laparoscopic cholecystectomy    . Lymph node dissection Left ~ 2000    "neck"  . Eye surgery    . Retinal detachment surgery Right   . Lithotripsy  X 3  . Coronary angioplasty with stent placement  2006    DES to mid RCA   . Dilation and curettage of uterus    . Tubal ligation    .  Cardiac catheterization N/A 10/06/2015    Procedure: Right/Left Heart Cath and Coronary Angiography;  Surgeon: Leonie Man, MD;  Location: [facility];  Service: Cardiovascular;  Laterality: N/A;   Family History  Problem Relation Age of Onset  . Coronary artery disease Father   . Coronary artery disease Mother   . Liver disease Mother   . Esophageal cancer Brother   . Breast cancer Sister    Social History  Substance Use Topics  . Smoking status: Former Smoker -- 0.25 packs/day for 23 years    Types: Cigarettes    Start date: 07/14/1986    Quit date: 09/18/2015  . Smokeless tobacco: Never Used  . Alcohol Use: No   OB History    Gravida Para Term Preterm AB TAB SAB Ectopic Multiple Living   3 3        3      Review of Systems  Constitutional: Positive for appetite change and fatigue.  Respiratory: Negative for shortness of breath.   Cardiovascular: Positive for chest pain.  Gastrointestinal: Negative for abdominal pain and blood in stool.  Genitourinary: Negative for flank pain.  Musculoskeletal: Negative for back pain.  Skin: Negative for wound.  Neurological: Positive for speech difficulty and weakness.  Hematological: Negative for adenopathy.  Psychiatric/Behavioral: Negative for confusion.      Allergies  Florinef; Iohexol; Sulfonamide derivatives; and Tape  Home Medications   Prior to Admission medications   Medication Sig Start Date End Date Taking? Authorizing Provider  acetaminophen (TYLENOL) 325 MG tablet Take 2 tablets (650 mg total) by mouth every 6 (six) hours as needed for mild pain (or Fever >/= 101). 09/18/15  Yes Robbie Lis, MD  albuterol (PROVENTIL) (2.5 MG/3ML) 0.083% nebulizer solution Take 3 mLs (2.5 mg total) by nebulization every 6 (six) hours as needed for wheezing or shortness of breath. 09/18/15  Yes Robbie Lis, MD  ALPRAZolam Duanne Moron) 0.5 MG tablet Take 0.5 mg by mouth 3 (three) times daily.    Yes Historical Provider, MD   amitriptyline (ELAVIL) 100 MG tablet Take 100 mg by mouth daily. 07/21/14  Yes Historical Provider, MD  Artificial Tear Solution (SOOTHE XP OP) Apply 1 drop to eye 3 (three) times daily.    Yes Historical Provider, MD  aspirin 81 MG tablet Take 81 mg by mouth daily.   Yes Historical Provider, MD  atorvastatin (LIPITOR) 40 MG tablet Take 1 tablet (40 mg total) by mouth at bedtime. Patient taking differently: Take 40 mg by mouth every morning.  04/21/13  Yes Lendon Colonel, NP  Biotin 5000 MCG CAPS Take 5,000 mcg by mouth daily.   Yes Historical Provider, MD  carvedilol (COREG) 3.125 MG tablet Take 1 tablet (3.125 mg total) by mouth 2 (two) times daily with a meal. 09/18/15  Yes Robbie Lis, MD  Cholecalciferol (VITAMIN D) 400 UNITS capsule Take 400 Units by mouth every evening.    Yes Historical Provider, MD  clobetasol cream (TEMOVATE) [id number] % Apply 1 application topically 2 (two) times daily.   Yes Historical Provider, MD  cycloSPORINE (RESTASIS) 0.05 % ophthalmic emulsion Place 1 drop into both eyes 2 (two) times daily.   Yes Historical Provider, MD  docusate sodium (COLACE) 100 MG capsule Take 100 mg by mouth 2 (two) times daily as needed for mild constipation.   Yes Historical Provider, MD  FERREX 150 150 MG capsule Take 150 mg by mouth daily. 10/13/15  Yes Historical Provider, MD  fludrocortisone (FLORINEF) 0.1 MG tablet Take 0.1 mg by mouth daily.   Yes Historical Provider, MD  folic acid (FOLVITE) 1 MG tablet Take 1 mg by mouth every morning.    Yes Historical Provider, MD  furosemide (LASIX) 20 MG tablet Take 1 tablet by mouth daily as needed for fluid.  10/11/15  Yes Historical Provider, MD  HYDROcodone-acetaminophen (NORCO/VICODIN) 5-325 MG per tablet Take 1 tablet by mouth 3 (three) times daily as needed for moderate pain.    Yes Historical Provider, MD  hydroxychloroquine (PLAQUENIL) 200 MG tablet Take 200 mg by mouth daily. To be taken with food or milk 02/07/13  Yes Historical Provider,  MD  KLOR-CON 10 10 MEQ tablet Take 10 mEq by mouth 3 (three) times daily. 10/07/15  Yes Historical Provider, MD  levothyroxine (SYNTHROID, LEVOTHROID) 25 MCG tablet Take 25 mcg by mouth every morning.    Yes Historical Provider, MD  medroxyPROGESTERone (PROVERA) 10 MG tablet Take 1 tablet (10 mg total) by mouth daily. 06/21/15  Yes Florian Buff, MD  Multiple Vitamin (MULTIVITAMIN) tablet Take 1 tablet by mouth daily.   Yes Historical Provider, MD  Multiple Vitamins-Minerals (PRESERVISION/LUTEIN PO) Take 1 capsule by mouth  daily.   Yes Historical Provider, MD  nitroGLYCERIN (NITROSTAT) 0.4 MG SL tablet Place 1 tablet (0.4 mg total) under the tongue every 5 (five) minutes as needed. Patient taking differently: Place 0.4 mg under the tongue every 5 (five) minutes as needed for chest pain.  05/20/11  Yes Lendon Colonel, NP  omeprazole (PRILOSEC) 20 MG capsule Take 20 mg by mouth daily.    Yes Historical Provider, MD  raloxifene (EVISTA) 60 MG tablet Take 60 mg by mouth every morning.    Yes Historical Provider, MD  sodium bicarbonate 325 MG tablet Take 650 mg by mouth 3 (three) times daily.    Yes Historical Provider, MD  Travoprost, BAK Free, (TRAVATAN) 0.004 % SOLN ophthalmic solution Place 1 drop into both eyes at bedtime.   Yes Historical Provider, MD  levofloxacin (LEVAQUIN) 750 MG tablet Take 1 tablet (750 mg total) by mouth every other day. Patient not taking: Reported on 10/28/2015 09/30/15   Maryann Mikhail, DO   BP 132/68 mmHg  Pulse 96  Temp(Src) 98.4 F (36.9 C) (Oral)  Resp 19  Ht 5\' 10"  (1.778 m)  Wt 119 lb (53.978 kg)  BMI 17.07 kg/m2  SpO2 99% Physical Exam  Constitutional: She appears well-developed.  HENT:  Head: Atraumatic.  Mucous membranes are dry  Eyes: EOM are normal.  Neck: Neck supple.  Cardiovascular:  Mild tachycardia  Abdominal: Soft.  Musculoskeletal: She exhibits no edema.  Neurological: She is alert.  Mildly slurred speech. Appropriate words. Moves  extremities symmetrically.  Skin: Skin is warm.  Psychiatric: She has a normal mood and affect.    ED Course  Procedures (including critical care time) Labs Review Labs Reviewed  COMPREHENSIVE METABOLIC PANEL - Abnormal; Notable for the following:    Sodium 133 (*)    CO2 18 (*)    Glucose, Bld 116 (*)    BUN 43 (*)    Creatinine, Ser 1.89 (*)    Albumin 3.0 (*)    GFR calc non Af Amer 25 (*)    GFR calc Af Amer 29 (*)    Anion gap 4 (*)    All other components within normal limits  CBC WITH DIFFERENTIAL/PLATELET - Abnormal; Notable for the following:    RBC 2.60 (*)    Hemoglobin 7.9 (*)    HCT 23.8 (*)    RDW 18.5 (*)    All other components within normal limits  URINALYSIS, ROUTINE W REFLEX MICROSCOPIC (NOT AT [facility]) - Abnormal; Notable for the following:    Protein, ur 30 (*)    Leukocytes, UA TRACE (*)    All other components within normal limits  URINE MICROSCOPIC-ADD ON - Abnormal; Notable for the following:    Squamous Epithelial / LPF 0-5 (*)    Bacteria, UA FEW (*)    Crystals CA OXALATE CRYSTALS (*)    All other components within normal limits  POC OCCULT BLOOD, ED    Imaging Review Dg Ribs Unilateral W/chest Right  10/28/2015  CLINICAL DATA:  Generalized right rib pain after fall 2 days prior. Difficulty speaking. EXAM: RIGHT RIBS AND CHEST - 3+ VIEW COMPARISON:  Chest radiograph 09/28/2015 FINDINGS: No evidence of acute right rib fracture. Cortical margins are intact. The lungs remain hyperinflated. No pleural effusion, pneumothorax or new focal airspace opacity. Bibasilar opacities, left greater than right, stable and likely scarring. IMPRESSION: No evidence of acute right rib fracture or acute intrathoracic process. Electronically Signed   By: Fonnie Birkenhead.D.  On: 10/28/2015 20:49   Ct Head Wo Contrast  10/28/2015  CLINICAL DATA:  Fall, patient hit forehead 3 days ago. Complains of frontal head pain and headache intermittently. Pt states pain to right  side of ribs, believes she "cracked a rib" when she fell 3 days ago. Pain with deep inhalation. EXAM: CT HEAD WITHOUT CONTRAST TECHNIQUE: Contiguous axial images were obtained from the base of the skull through the vertex without intravenous contrast. COMPARISON:  04/13/2011 FINDINGS: The ventricles are normal in configuration there is mild ventricular enlargement reflecting age related volume loss. There are no parenchymal masses or mass effect. 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 There is no evidence of a recent infarct. There are no extra-axial masses or abnormal fluid collections. There is no intracranial hemorrhage. Tendons secretions are noted in the right sphenoid sinus. Remaining sinuses are clear. Clear mastoid air cells. No skull fracture. IMPRESSION: 1. No acute intracranial abnormalities. 2. 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 Electronically Signed   By: Lajean Manes M.D.   On: 10/28/2015 20:53   I have personally reviewed and evaluated these images and lab results as part of my medical decision-making.   EKG Interpretation None      MDM   Final diagnoses:  Dehydration  Chest wall contusion, right, initial encounter  Renal insufficiency  Anemia of chronic disease    Patient generalized weakness with fall. Contusion to right chest wall without clear fracture on x-ray. Also worsening dehydration. Creatinine is at baseline but BUN is increased. Hemoglobin is also decreased but is guaiac negative. History of anemia of chronic disease. Also had some difficulty speaking. Will admit to internal medicine.    Davonna Belling, MD 10/28/15 2123

## 2015-10-28 NOTE — ED Notes (Signed)
Pt reports was admitted a few weeks ago for "heart problem."  Reports hasn't been eating well since discharged.  C/O generalized weakness and has fallen 3 times this week.

## 2015-10-29 ENCOUNTER — Observation Stay (HOSPITAL_COMMUNITY): Payer: Medicare Other

## 2015-10-29 DIAGNOSIS — N184 Chronic kidney disease, stage 4 (severe): Secondary | ICD-10-CM | POA: Diagnosis not present

## 2015-10-29 DIAGNOSIS — R55 Syncope and collapse: Secondary | ICD-10-CM | POA: Diagnosis not present

## 2015-10-29 DIAGNOSIS — N200 Calculus of kidney: Secondary | ICD-10-CM | POA: Diagnosis not present

## 2015-10-29 DIAGNOSIS — D649 Anemia, unspecified: Secondary | ICD-10-CM

## 2015-10-29 DIAGNOSIS — E860 Dehydration: Secondary | ICD-10-CM | POA: Diagnosis not present

## 2015-10-29 DIAGNOSIS — I5022 Chronic systolic (congestive) heart failure: Secondary | ICD-10-CM | POA: Diagnosis not present

## 2015-10-29 DIAGNOSIS — D638 Anemia in other chronic diseases classified elsewhere: Secondary | ICD-10-CM | POA: Diagnosis not present

## 2015-10-29 LAB — PREPARE RBC (CROSSMATCH)

## 2015-10-29 LAB — MRSA PCR SCREENING: MRSA BY PCR: POSITIVE — AB

## 2015-10-29 LAB — CBC
HCT: 21.9 % — ABNORMAL LOW (ref 36.0–46.0)
Hemoglobin: 7 g/dL — ABNORMAL LOW (ref 12.0–15.0)
MCH: 29.4 pg (ref 26.0–34.0)
MCHC: 32 g/dL (ref 30.0–36.0)
MCV: 92 fL (ref 78.0–100.0)
PLATELETS: 164 10*3/uL (ref 150–400)
RBC: 2.38 MIL/uL — ABNORMAL LOW (ref 3.87–5.11)
RDW: 18.6 % — AB (ref 11.5–15.5)
WBC: 3.3 10*3/uL — AB (ref 4.0–10.5)

## 2015-10-29 LAB — COMPREHENSIVE METABOLIC PANEL
ALBUMIN: 2.6 g/dL — AB (ref 3.5–5.0)
ALK PHOS: 74 U/L (ref 38–126)
ALT: 17 U/L (ref 14–54)
AST: 17 U/L (ref 15–41)
Anion gap: 7 (ref 5–15)
BILIRUBIN TOTAL: 0.2 mg/dL — AB (ref 0.3–1.2)
BUN: 39 mg/dL — AB (ref 6–20)
CALCIUM: 8.6 mg/dL — AB (ref 8.9–10.3)
CO2: 16 mmol/L — ABNORMAL LOW (ref 22–32)
CREATININE: 1.73 mg/dL — AB (ref 0.44–1.00)
Chloride: 116 mmol/L — ABNORMAL HIGH (ref 101–111)
GFR calc Af Amer: 33 mL/min — ABNORMAL LOW (ref >60)
GFR, EST NON AFRICAN AMERICAN: 28 mL/min — AB (ref >60)
GLUCOSE: 85 mg/dL (ref 65–99)
POTASSIUM: 3.7 mmol/L (ref 3.5–5.1)
SODIUM: 139 mmol/L (ref 135–145)
TOTAL PROTEIN: 6.7 g/dL (ref 6.5–8.1)

## 2015-10-29 LAB — IRON AND TIBC
Iron: 66 ug/dL (ref 28–170)
SATURATION RATIOS: 34 % — AB (ref 10.4–31.8)
TIBC: 193 ug/dL — ABNORMAL LOW (ref 250–450)
UIBC: 127 ug/dL

## 2015-10-29 LAB — SEDIMENTATION RATE: SED RATE: 55 mm/h — AB (ref 0–22)

## 2015-10-29 LAB — TROPONIN I: TROPONIN I: 0.06 ng/mL — AB (ref <0.031)

## 2015-10-29 LAB — VITAMIN B12: Vitamin B-12: 850 pg/mL (ref 180–914)

## 2015-10-29 LAB — TSH: TSH: 4.561 u[IU]/mL — AB (ref 0.350–4.500)

## 2015-10-29 LAB — FERRITIN: FERRITIN: 795 ng/mL — AB (ref 11–307)

## 2015-10-29 MED ORDER — SODIUM CHLORIDE 0.9 % IV SOLN
Freq: Once | INTRAVENOUS | Status: AC
  Administered 2015-10-30: 09:00:00 via INTRAVENOUS

## 2015-10-29 NOTE — Progress Notes (Signed)
Alerted by tele that patient had short run of VT.  Patient just up to BR and back to bed from chair. VSS at this time, patient without symptoms.  MD made aware. Will continue to monitor

## 2015-10-29 NOTE — Progress Notes (Signed)
PROGRESS NOTE    Tiffany [name]  [id number] DOB: 03/26/1941 DOA: 10/28/2015 PCP: Cleda Mccreedy, MD Outpatient Specialists:  Dermatology: Allyn Kenner, MD  Nephrology: Darrold Span. Florene Glen, MD  Cardiology: Jacqulyn Ducking, MD.   Brief Narrative:  58 yof with history of CAD s/p stent,HTN HLDM cardiomyopathy (EF 35-45%), Sojogren, RA, and chronic anemia presented with complaints of near syncope. While in the ED, patient noted to have Hgb of 7.9, which is below her baseline. She she has been referred for admission for anemia and near syncope.   Assessment & Plan:   Active Problems:   Anemia   Dehydration   Near syncope   CKD (chronic kidney disease) stage 4, GFR 15-29 ml/min (HCC)  1. Chronic anemia, likely related to renal disease. She has had a further decline in Hgb without any clear evidence of bleeding. FOBT in ED found to be negative,. Anemia panel is in process. Since she is sympathic with associated weakness and dizziness on standing. Will transfuse 1 unit PRBCs. She complains of right-sided abdominal/ back pain since her fall. Will check CT A/P to rule out rectoperineal hemorrhage.   2. Generalized weakness possibly related to anemia. Will consult PT.  3. Near syncope. Possibly related to anemia.  4. Elevated troponin. Suspect this is demand ischemia in the setting of anemia. She does not have typical chest pain.  5. CKD Stage IV. Creatinine improving with IVF and appears to be near baseline.     DVT prophylaxis: SCDs Code Status: Full Family Communication: No family bedside Disposition Plan: Anticipate discharge in 24-48 hours.    Consultants:   None  Procedures:   None  Antimicrobials:   None    Subjective: Right sided pain onset three days ago s/p fall. Dizziness upon standing and frequent falls. She reports that her granddaughter will be getting her a walker. Denies nausea, vomiting, or diarrhea. Stools have been normal. Oral intake has been poor the last few  days but has been able to eat during hospitalization. Denies any noticeable bruising on abdomen after fall.   Objective: Filed Vitals:   10/28/15 2300 10/29/15 0614 10/29/15 1058 10/29/15 1312  BP: 132/73 108/59 111/60 104/53  Pulse: 89 93 90 89  Temp: 98.7 F (37.1 C) 98.9 F (37.2 C)  97.4 F (36.3 C)  TempSrc: Oral Oral  Oral  Resp: 16 16  30   Height:      Weight: 52.254 kg (115 lb 3.2 oz)     SpO2: 100% 100%  100%   No intake or output data in the 24 hours ending 10/29/15 1332 Filed Weights   10/28/15 1832 10/28/15 2300  Weight: 53.978 kg (119 lb) 52.254 kg (115 lb 3.2 oz)    Examination: General exam: Appears calm and comfortable  Respiratory system: Clear to auscultation. Respiratory effort normal. Cardiovascular system: S1 & S2 heard, RRR. No JVD, murmurs, rubs, gallops or clicks. No pedal edema. Gastrointestinal system: Abdomen is non-distended.  No organomegaly or masses felt. Normal bowel sounds heard. Tenderness noted in the right lower abdomen.  Central nervous system: Alert and oriented. No focal neurological deficits. Extremities: Symmetric 5 x 5 power. Skin: No rashes, lesions or ulcers. No bruising noted in the right flank. Patient appears to be pale.  Psychiatry: Judgement and insight appear normal. Mood & affect appropriate.     Data Reviewed: I have personally reviewed following labs and imaging studies  CBC:  Recent Labs Lab 10/28/15 1855 10/29/15 0524  WBC 4.9 3.3*  NEUTROABS 3.4  --  HGB 7.9* 7.0*  HCT 23.8* 21.9*  MCV 91.5 92.0  PLT 179 123456   Basic Metabolic Panel:  Recent Labs Lab 10/28/15 1855 10/29/15 0524  NA 133* 139  K 3.9 3.7  CL 111 116*  CO2 18* 16*  GLUCOSE 116* 85  BUN 43* 39*  CREATININE 1.89* 1.73*  CALCIUM 8.9 8.6*   GFR: Estimated Creatinine Clearance: 23.9 mL/min (by C-G formula based on Cr of 1.73). Liver Function Tests:  Recent Labs Lab 10/28/15 1855 10/29/15 0524  AST 20 17  ALT 20 17  ALKPHOS 84 74    BILITOT 0.6 0.2*  PROT 7.5 6.7  ALBUMIN 3.0* 2.6*   Urine analysis:    Component Value Date/Time   COLORURINE YELLOW 10/28/2015 2023   APPEARANCEUR CLEAR 10/28/2015 2023   LABSPEC 1.020 10/28/2015 2023   PHURINE 6.0 10/28/2015 2023   GLUCOSEU NEGATIVE 10/28/2015 2023   HGBUR NEGATIVE 10/28/2015 2023   BILIRUBINUR NEGATIVE 10/28/2015 2023   KETONESUR NEGATIVE 10/28/2015 2023   PROTEINUR 30* 10/28/2015 2023   UROBILINOGEN 0.2 11/03/2014 1100   NITRITE NEGATIVE 10/28/2015 2023   LEUKOCYTESUR TRACE* 10/28/2015 2023   Sepsis Labs: @LABRCNTIP (procalcitonin:4,lacticidven:4)  ) Recent Results (from the past 240 hour(s))  MRSA PCR Screening     Status: Abnormal   Collection Time: 10/28/15 11:30 PM  Result Value Ref Range Status   MRSA by PCR POSITIVE (A) NEGATIVE Final    Comment:        The GeneXpert MRSA Assay (FDA approved for NASAL specimens only), is one component of a comprehensive MRSA colonization surveillance program. It is not intended to diagnose MRSA infection nor to guide or monitor treatment for MRSA infections. RESULT CALLED TO, READ BACK BY AND VERIFIED WITH:  LAVINDER,J @ 0215 ON 10/29/15 BY [facility]          Radiology Studies: Dg Ribs Unilateral W/chest Right  10/28/2015  CLINICAL DATA:  Generalized right rib pain after fall 2 days prior. Difficulty speaking. EXAM: RIGHT RIBS AND CHEST - 3+ VIEW COMPARISON:  Chest radiograph 09/28/2015 FINDINGS: No evidence of acute right rib fracture. Cortical margins are intact. The lungs remain hyperinflated. No pleural effusion, pneumothorax or new focal airspace opacity. Bibasilar opacities, left greater than right, stable and likely scarring. IMPRESSION: No evidence of acute right rib fracture or acute intrathoracic process. Electronically Signed   By: Jeb Levering M.D.   On: 10/28/2015 20:49   Ct Head Wo Contrast  10/28/2015  CLINICAL DATA:  Fall, patient hit forehead 3 days ago. Complains of frontal head pain  and headache intermittently. Pt states pain to right side of ribs, believes she "cracked a rib" when she fell 3 days ago. Pain with deep inhalation. EXAM: CT HEAD WITHOUT CONTRAST TECHNIQUE: Contiguous axial images were obtained from the base of the skull through the vertex without intravenous contrast. COMPARISON:  04/13/2011 FINDINGS: The ventricles are normal in configuration there is mild ventricular enlargement reflecting age related volume loss. There are no parenchymal masses or mass effect. 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 There is no evidence of a recent infarct. There are no extra-axial masses or abnormal fluid collections. There is no intracranial hemorrhage. Tendons secretions are noted in the right sphenoid sinus. Remaining sinuses are clear. Clear mastoid air cells. No skull fracture. IMPRESSION: 1. No acute intracranial abnormalities. 2. 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 Electronically Signed   By: Dedra Skeens.D.  On: 10/28/2015 20:53        Scheduled Meds: . sodium chloride   Intravenous Once  . ALPRAZolam  0.5 mg Oral TID  . amitriptyline  100 mg Oral QHS  . aspirin EC  81 mg Oral Daily  . atorvastatin  40 mg Oral QHS  . carvedilol  3.125 mg Oral BID WC  . cholecalciferol  400 Units Oral QPM  . cycloSPORINE  1 drop Both Eyes BID  . folic acid  1 mg Oral q morning - 10a  . hydroxychloroquine  200 mg Oral Daily  . iron polysaccharides  150 mg Oral Daily  . latanoprost  1 drop Both Eyes QHS  . levothyroxine  25 mcg Oral QAC breakfast  . medroxyPROGESTERone  10 mg Oral Daily  . multivitamin-lutein   Oral Daily  . pantoprazole  40 mg Oral Daily  . potassium chloride  10 mEq Oral TID  . raloxifene  60 mg Oral q morning - 10a  . sodium bicarbonate  650 mg Oral TID  . sodium chloride flush  3 mL Intravenous Q12H  . sodium chloride flush  3 mL Intravenous Q12H   Continuous Infusions:        Time spent: 20 minutes    Kathie Dike, MD  [facility] Pager [phone]  If 7PM-7AM, please contact night-coverage [email or web address] Password [email or web address] 10/29/2015, 1:32 PM     By signing my name below, I, Rennis Harding, attest that this documentation has been prepared under the direction and in the presence of Kathie Dike, MD. Electronically signed: Rennis Harding, Scribe. 10/29/2015 1:12pm  I, Dr. Kathie Dike, personally performed the services described in this documentaiton. All medical record entries made by the scribe were at my direction and in my presence. I have reviewed the chart and agree that the record reflects my personal performance and is accurate and complete  Kathie Dike, MD, 10/29/2015 1:32 PM

## 2015-10-29 NOTE — Care Management Obs Status (Signed)
[place] NOTIFICATION   Patient Details  Name: Tiffany [name] MRN: [id number] Date of Birth: 1940-12-08   Medicare Observation Status Notification Given:  Yes    Briant Sites, RN 10/29/2015, 12:33 PM

## 2015-10-30 LAB — CBC
HEMATOCRIT: 27.1 % — AB (ref 36.0–46.0)
Hemoglobin: 8.9 g/dL — ABNORMAL LOW (ref 12.0–15.0)
MCH: 30.6 pg (ref 26.0–34.0)
MCHC: 32.8 g/dL (ref 30.0–36.0)
MCV: 93.1 fL (ref 78.0–100.0)
Platelets: 153 10*3/uL (ref 150–400)
RBC: 2.91 MIL/uL — ABNORMAL LOW (ref 3.87–5.11)
RDW: 17.7 % — AB (ref 11.5–15.5)
WBC: 5 10*3/uL (ref 4.0–10.5)

## 2015-10-30 LAB — BASIC METABOLIC PANEL
ANION GAP: 3 — AB (ref 5–15)
BUN: 31 mg/dL — ABNORMAL HIGH (ref 6–20)
CALCIUM: 8.3 mg/dL — AB (ref 8.9–10.3)
CO2: 17 mmol/L — AB (ref 22–32)
Chloride: 112 mmol/L — ABNORMAL HIGH (ref 101–111)
Creatinine, Ser: 1.65 mg/dL — ABNORMAL HIGH (ref 0.44–1.00)
GFR calc Af Amer: 34 mL/min — ABNORMAL LOW (ref >60)
GFR calc non Af Amer: 30 mL/min — ABNORMAL LOW (ref >60)
GLUCOSE: 88 mg/dL (ref 65–99)
Potassium: 3.9 mmol/L (ref 3.5–5.1)
Sodium: 132 mmol/L — ABNORMAL LOW (ref 135–145)

## 2015-10-30 MED ORDER — MUPIROCIN 2 % EX OINT
1.0000 "application " | TOPICAL_OINTMENT | Freq: Two times a day (BID) | CUTANEOUS | Status: DC
  Administered 2015-10-30 – 2015-11-03 (×9): 1 via NASAL
  Filled 2015-10-30 (×3): qty 22

## 2015-10-30 MED ORDER — MILK AND MOLASSES ENEMA: 1.0000 | Freq: Once | RECTAL | Status: DC

## 2015-10-30 MED ORDER — CHLORHEXIDINE GLUCONATE CLOTH 2 % EX PADS
6.0000 | MEDICATED_PAD | Freq: Every day | CUTANEOUS | Status: DC
  Administered 2015-10-31 – 2015-11-02 (×3): 6 via TOPICAL

## 2015-10-30 NOTE — Progress Notes (Signed)
PROGRESS NOTE    DEADRE SCHIMMING  [id number] DOB: [birth year] DOA: 10/28/2015 PCP: Cleda Mccreedy, MD Outpatient Specialists:  Dermatology: Allyn Kenner, MD  Nephrology: Darrold Span. Florene Glen, MD  Cardiology: Jacqulyn Ducking, MD.   Brief Narrative:  82 yof with history of CAD s/p stent,HTN HLDM cardiomyopathy (EF 35-45%), Sojogren, RA, and chronic anemia presented with complaints of near syncope. While in the ED, patient noted to have Hgb of 7.9, which is below her baseline. She has been referred for admission for anemia and near syncope.   Assessment & Plan:   Active Problems:   Anemia   Dehydration   Near syncope   CKD (chronic kidney disease) stage 4, GFR 15-29 ml/min (HCC)  1.  anemia, of chronic disease. Likely related to renal disease. She has had a further decline in Hgb without any clear evidence of bleeding. FOBT in ED found to be negative. She is sympathic with associated weakness and dizziness on standing. Transfusion of PRBC was delayed due to requirement of blood from [facility]. She is receiving 1 unit PRBCs at this time.  Will follow up CBC. CT A/P was negative for any signs of bleeding. 2. Generalized weakness possibly related to anemia. PT recommend Home health PT.  3. Near syncope. Possibly related to anemia. She is also orthostatic. Will recheck once she is transfused.  4. Elevated troponin. Suspect this is demand ischemia in the setting of anemia. She does not have typical chest pain.  5. CKD Stage IV. Creatinine improving with IVF and appears to be near baseline.  6. Constipation, revealed on CT. Will start bowel regimen.    DVT prophylaxis: SCDs Code Status: Full Family Communication: No family bedside Disposition Plan: Anticipate discharge in 24-48 hours.    Consultants:   Physical therapy  Procedures:   None  Antimicrobials:   None    Subjective: Feeling ok. Has not had a bowel movement in three days. Still has lower abdominal pain.   Per nursing  patient is moderately orthostatic when sitting up.   Objective: Filed Vitals:   10/30/15 0955 10/30/15 1009 10/30/15 1011 10/30/15 1105  BP:  99/61 111/51 123/52  Pulse:  91 90 68  Temp:    97.7 F (36.5 C)  TempSrc:    Oral  Resp:    18  Height:      Weight:      SpO2: 100% 100% 100%     Intake/Output Summary (Last 24 hours) at 10/30/15 1134 Last data filed at 10/30/15 1122  Gross per 24 hour  Intake    520 ml  Output      0 ml  Net    520 ml   Filed Weights   10/28/15 1832 10/28/15 2300 10/30/15 0635  Weight: 53.978 kg (119 lb) 52.254 kg (115 lb 3.2 oz) 55.566 kg (122 lb 8 oz)    Examination: General exam: Nad Respiratory system: Clear to auscultation. Respiratory effort normal. Cardiovascular system: S1 & S2 heard, RRR. No JVD, murmurs, rubs, gallops or clicks. No pedal edema. Gastrointestinal system: Abdomen is non-distended.  No organomegaly or masses felt. Normal bowel sounds heard. Tenderness noted in the right lower abdomen.  Central nervous system: Alert and oriented. No focal neurological deficits. Extremities: Symmetric 5 x 5 power. Skin: No rashes, lesions or ulcers.  Patient appears to be pale.  Psychiatry: Judgement and insight appear normal. Mood & affect appropriate.     Data Reviewed: I have personally reviewed following labs and imaging studies  CBC:  Recent Labs Lab 10/28/15 1855 10/29/15 0524  WBC 4.9 3.3*  NEUTROABS 3.4  --   HGB 7.9* 7.0*  HCT 23.8* 21.9*  MCV 91.5 92.0  PLT 179 [id number]   Basic Metabolic Panel:  Recent Labs Lab 10/28/15 1855 10/29/15 0524  NA 133* 139  K 3.9 3.7  CL 111 116*  CO2 18* 16*  GLUCOSE 116* 85  BUN 43* 39*  CREATININE 1.89* 1.73*  CALCIUM 8.9 8.6*   GFR: Estimated Creatinine Clearance: 25.4 mL/min (by C-G formula based on Cr of 1.73). Liver Function Tests:  Recent Labs Lab 10/28/15 1855 10/29/15 0524  AST 20 17  ALT 20 17  ALKPHOS 84 74  BILITOT 0.6 0.2*  PROT 7.5 6.7  ALBUMIN 3.0* 2.6*    Urine analysis:    Component Value Date/Time   COLORURINE YELLOW 10/28/2015 2023   APPEARANCEUR CLEAR 10/28/2015 2023   LABSPEC 1.020 10/28/2015 2023   PHURINE 6.0 10/28/2015 2023   GLUCOSEU NEGATIVE 10/28/2015 2023   HGBUR NEGATIVE 10/28/2015 2023   BILIRUBINUR NEGATIVE 10/28/2015 2023   KETONESUR NEGATIVE 10/28/2015 2023   PROTEINUR 30* 10/28/2015 2023   UROBILINOGEN 0.2 11/03/2014 1100   NITRITE NEGATIVE 10/28/2015 2023   LEUKOCYTESUR TRACE* 10/28/2015 2023   Sepsis Labs: @LABRCNTIP (procalcitonin:4,lacticidven:4)  ) Recent Results (from the past 240 hour(s))  MRSA PCR Screening     Status: Abnormal   Collection Time: 10/28/15 11:30 PM  Result Value Ref Range Status   MRSA by PCR POSITIVE (A) NEGATIVE Final    Comment:        The GeneXpert MRSA Assay (FDA approved for NASAL specimens only), is one component of a comprehensive MRSA colonization surveillance program. It is not intended to diagnose MRSA infection nor to guide or monitor treatment for MRSA infections. RESULT CALLED TO, READ BACK BY AND VERIFIED WITH:  LAVINDER,J @ [id number] ON 10/29/15 BY [facility]          Radiology Studies: Ct Abdomen Pelvis Wo Contrast  10/29/2015  CLINICAL DATA:  [place] 3 days ago. RIGHT lower quadrant pain, assess for retroperitoneal hematoma. History of lymphoma, lupus, chronic kidney disease. EXAM: CT ABDOMEN AND PELVIS WITHOUT CONTRAST TECHNIQUE: Multidetector CT imaging of the abdomen and pelvis was performed following the standard protocol without IV contrast. COMPARISON:  CT abdomen and pelvis September 08, 2015 FINDINGS: LUNG BASES: Similar bilateral lower lobe bronchiectasis. Heart size is normal. Mild coronary artery calcifications. No pericardial effusion. KIDNEYS/BLADDER: Kidneys are orthotopic, relatively cyst symmetric cortical atrophy and scarring. Bilateral nephrolithiasis, RIGHT greater than LEFT measuring up to 5 mm on the RIGHT. Interval passage of LEFT lower pole 6 mm  nephrolithiasis. No hydronephrosis ; limited assessment for renal masses on this nonenhanced examination. The unopacified ureters are normal in course and caliber. Urinary bladder is well distended with multiple punctate layering calculi. SOLID ORGANS: The liver, spleen, pancreas and adrenal glands are unremarkable for this non-contrast examination. Status post cholecystectomy. GASTROINTESTINAL TRACT: Small hiatal hernia. The stomach, small and large bowel are normal in course and caliber without inflammatory changes, the sensitivity may be decreased by lack of enteric contrast. Moderate to large amount of retained large bowel stool. The appendix is not discretely identified, however there are no inflammatory changes in the right lower quadrant. PERITONEUM/RETROPERITONEUM: Aortoiliac vessels are normal in course and caliber, severe calcific atherosclerosis. No lymphadenopathy by CT size criteria. Internal reproductive organs are unremarkable. No intraperitoneal free fluid nor free air. SOFT TISSUES/ OSSEOUS STRUCTURES: Nonsuspicious. Osteopenia. Mild chronic T11 compression fracture. Stable degenerative  change of the spine. Focal irregular sclerosis bilateral femoral heads. Moderate degenerative change of the hips. IMPRESSION: No acute intra-abdominal or pelvic process, no retroperitoneal hematoma. Bilateral nonobstructing nephrolithiasis and interval passage of LEFT lower pole nephrolithiasis. Moderate to large amount of retained large bowel stool without bowel obstruction. 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 Electronically Signed   By: Elon Alas M.D.   On: 10/29/2015 18:04   Dg Ribs Unilateral W/chest Right  10/28/2015  CLINICAL DATA:  Generalized right rib pain after fall 2 days prior. Difficulty speaking. EXAM: RIGHT RIBS AND CHEST - 3+ VIEW COMPARISON:  Chest radiograph 09/28/2015 FINDINGS: No evidence of acute right rib fracture. Cortical margins are intact. The lungs remain  hyperinflated. No pleural effusion, pneumothorax or new focal airspace opacity. Bibasilar opacities, left greater than right, stable and likely scarring. IMPRESSION: No evidence of acute right rib fracture or acute intrathoracic process. Electronically Signed   By: Jeb Levering M.D.   On: 10/28/2015 20:49   Ct Head Wo Contrast  10/28/2015  CLINICAL DATA:  Fall, patient hit forehead 3 days ago. Complains of frontal head pain and headache intermittently. Pt states pain to right side of ribs, believes she "cracked a rib" when she fell 3 days ago. Pain with deep inhalation. EXAM: CT HEAD WITHOUT CONTRAST TECHNIQUE: Contiguous axial images were obtained from the base of the skull through the vertex without intravenous contrast. COMPARISON:  04/13/2011 FINDINGS: The ventricles are normal in configuration there is mild ventricular enlargement reflecting age related volume loss. There are no parenchymal masses or mass effect. 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 There is no evidence of a recent infarct. There are no extra-axial masses or abnormal fluid collections. There is no intracranial hemorrhage. Tendons secretions are noted in the right sphenoid sinus. Remaining sinuses are clear. Clear mastoid air cells. No skull fracture. IMPRESSION: 1. No acute intracranial abnormalities. 2. 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 Electronically Signed   By: Lajean Manes M.D.   On: 10/28/2015 20:53        Scheduled Meds: . ALPRAZolam  0.5 mg Oral TID  . amitriptyline  100 mg Oral QHS  . aspirin EC  81 mg Oral Daily  . atorvastatin  40 mg Oral QHS  . carvedilol  3.125 mg Oral BID WC  . Chlorhexidine Gluconate Cloth  6 each Topical Q0600  . cholecalciferol  400 Units Oral QPM  . cycloSPORINE  1 drop Both Eyes BID  . folic acid  1 mg Oral q morning - 10a  . hydroxychloroquine  200 mg Oral Daily  . iron polysaccharides  150 mg Oral Daily    . latanoprost  1 drop Both Eyes QHS  . levothyroxine  25 mcg Oral QAC breakfast  . medroxyPROGESTERone  10 mg Oral Daily  . multivitamin-lutein   Oral Daily  . mupirocin ointment  1 application Nasal BID  . pantoprazole  40 mg Oral Daily  . potassium chloride  10 mEq Oral TID  . raloxifene  60 mg Oral q morning - 10a  . sodium bicarbonate  650 mg Oral TID  . sodium chloride flush  3 mL Intravenous Q12H  . sodium chloride flush  3 mL Intravenous Q12H   Continuous Infusions:       Time spent: 20 minutes    Kathie Dike, MD  [facility] Pager [phone]  If 7PM-7AM, please contact night-coverage [email or web address] Password [facility] 10/30/2015,  11:34 AM     By signing my name below, I, Rennis Harding, attest that this documentation has been prepared under the direction and in the presence of Kathie Dike, MD. Electronically signed: Rennis Harding, Scribe. 10/30/2015 11:34am   I, Dr. Kathie Dike, personally performed the services described in this documentaiton. All medical record entries made by the scribe were at my direction and in my presence. I have reviewed the chart and agree that the record reflects my personal performance and is accurate and complete  Kathie Dike, MD, 10/30/2015 11:34 AM

## 2015-10-30 NOTE — Evaluation (Signed)
Physical Therapy Evaluation Patient Details Name: Tiffany [name] MRN: [id number] DOB: 1941/04/20 Today's Date: 10/30/2015   History of Present Illness  75 yo F admitted with c/o near syncope, and was anemic upon admission to ED with Hgb of 7.9 on 6/17, which continues to fall and last reported was on 6/18 and it was 7.0 with Hct of 21.9. Orders for transfusion of PRBC's. CT of the abdomen/pelvis (-) for acute intra-abdominal or pelvic process, no retroperitoneal hematoma, it does show B femoral head AVN without collapse. PMH: CAD s/p stent, HTN, cardiomyopathy (EF: 35-45%), Sjogren, RA, orthostatic hypotension, chronic anemia, CKD stage 3, lymphoma - mass excision, SLE, depression, dysphagia, heart murmur, MI, COPD, PNA, blood transfusion  Clinical Impression  Pt received in bed, and was agreeable to PT evaluation.  Pt states that she normally ambulates with a RW, and has assistance at home for her ADL's.  Bed level PT evaluation completed today due to anemia with signs and symptoms monitored during tx.  Today, pt demonstrated Mod (I) for bed mobility, however further mobility limited due to noted orthostatic hypotension with pt stating that she "may be a little bit dizzy." Pt was able to complete seated LE exercises without complication, and then returned to supine in bed.  Pt continues to c/o 10/10 R flank pain.  BP's during tx are as follows:  Supine BP at rest: 108/59, HR: 76bpm Sitting BP: 81/45, HR: 89bpm Supine BP at end of tx: 111/51, HR: 90bpm At this point, she is recommended for HHPT, however this will be updated as she is able to complete further functional mobility.      Follow Up Recommendations Other (comment);Home health PT (TBD - based on further mobility assessment. )    Equipment Recommendations  None recommended by PT    Recommendations for Other Services       Precautions / Restrictions Precautions Precautions: Fall Precaution Comments: Reason for admission with syncope  and anemia.  Restrictions Weight Bearing Restrictions: No      Mobility  Bed Mobility Overal bed mobility: Modified Independent Bed Mobility: Supine to Sit;Sit to Supine     Supine to sit: Modified independent (Device/Increase time);HOB elevated Sit to supine: Modified independent (Device/Increase time)      Transfers Overall transfer level:  (Unable to evaluate due to orthostatic hypotension with c/o feeling lightheaded. )                  Ambulation/Gait                Stairs            Wheelchair Mobility    Modified Rankin (Stroke Patients Only)       Balance Overall balance assessment: Needs assistance Sitting-balance support: Bilateral upper extremity supported Sitting balance-Leahy Scale: Fair Sitting balance - Comments: Posterior lean while sitting EOB - likely due to core weakness.                                      Pertinent Vitals/Pain Pain Assessment: 0-10 Pain Score: 10-Worst pain ever Pain Location: kidneys and in her side on the right Pain Descriptors / Indicators: Aching Pain Intervention(s): Monitored during session    Home Living   Living Arrangements: Other (Comment) (granddaugher and her husband) Available Help at Discharge: Available 24 hours/day (dtr lives just a little ways away) Type of Home: House Home Access: Stairs to enter  Entrance Stairs-Rails: None Entrance Stairs-Number of Steps: 2 on the back porch Home Layout: One level Home Equipment: Bedside commode;Walker - 2 wheels;Cane - single point;Wheelchair - Regulatory affairs officer / Transfers Assistance Needed: uses a RW at all times.  w/c for community mobility.   ADL's / Homemaking Assistance Needed: granddaughter assists with bathing, cooking, cleaning, and dressing.         Hand Dominance   Dominant Hand: Right    Extremity/Trunk Assessment   Upper Extremity Assessment: Generalized weakness           Lower  Extremity Assessment: Generalized weakness         Communication   Communication: No difficulties  Cognition Arousal/Alertness: Awake/alert Behavior During Therapy: WFL for tasks assessed/performed Overall Cognitive Status: Within Functional Limits for tasks assessed                      General Comments      Exercises General Exercises - Lower Extremity Ankle Circles/Pumps: AROM;Both;Seated;Other (comment) (For 1 minute) Long Arc Quad: Strengthening;Both;10 reps;Seated Hip ABduction/ADduction: Strengthening;Both;10 reps;Seated Hip Flexion/Marching: Strengthening;Both;10 reps;Seated      Assessment/Plan    PT Assessment Patient needs continued PT services  PT Diagnosis Generalized weakness;Difficulty walking;Abnormality of gait   PT Problem List Decreased strength;Decreased activity tolerance;Decreased balance;Decreased mobility;Cardiopulmonary status limiting activity  PT Treatment Interventions Gait training;DME instruction;Functional mobility training;Therapeutic activities;Stair training;Therapeutic exercise;Balance training;Patient/family education   PT Goals (Current goals can be found in the Care Plan section) Acute Rehab PT Goals Patient Stated Goal: Pt wants to go home today.  PT Goal Formulation: With patient Time For Goal Achievement: 11/06/15 Potential to Achieve Goals: Good    Frequency Min 3X/week   Barriers to discharge        Co-evaluation               End of Session   Activity Tolerance: Treatment limited secondary to medical complications (Comment);Other (comment) (orthostatic hypotension) Patient left: in bed      Functional Assessment Tool Used: [employer] "6-clicks"  and clinical judgement Functional Limitation: Mobility: Walking and moving around Mobility: Walking and Moving Around Current Status [phone]): At least 40 percent but less than 60 percent impaired, limited or restricted Mobility: Walking and Moving  Around Goal Status [phone]): At least 20 percent but less than 40 percent impaired, limited or restricted    Time: 0948-1015 PT Time Calculation (min) (ACUTE ONLY): 27 min   Charges:   PT Evaluation $PT Eval Moderate Complexity: 1 Procedure PT Treatments $Therapeutic Exercise: 8-22 mins   PT G Codes:   PT G-Codes **NOT FOR INPATIENT CLASS** Functional Assessment Tool Used: [employer] "6-clicks"  and clinical judgement Functional Limitation: Mobility: Walking and moving around Mobility: Walking and Moving Around Current Status [phone]): At least 40 percent but less than 60 percent impaired, limited or restricted Mobility: Walking and Moving Around Goal Status [phone]): At least 20 percent but less than 40 percent impaired, limited or restricted    Beth Mattilyn Crites, PT, DPT X: [phone]

## 2015-10-31 DIAGNOSIS — I5022 Chronic systolic (congestive) heart failure: Secondary | ICD-10-CM | POA: Diagnosis present

## 2015-10-31 LAB — CBC
HEMATOCRIT: 26.6 % — AB (ref 36.0–46.0)
HEMOGLOBIN: 8.8 g/dL — AB (ref 12.0–15.0)
MCH: 30.6 pg (ref 26.0–34.0)
MCHC: 33.1 g/dL (ref 30.0–36.0)
MCV: 92.4 fL (ref 78.0–100.0)
Platelets: 161 10*3/uL (ref 150–400)
RBC: 2.88 MIL/uL — AB (ref 3.87–5.11)
RDW: 18.1 % — ABNORMAL HIGH (ref 11.5–15.5)
WBC: 3.7 10*3/uL — AB (ref 4.0–10.5)

## 2015-10-31 LAB — BASIC METABOLIC PANEL
Anion gap: 4 — ABNORMAL LOW (ref 5–15)
BUN: 29 mg/dL — ABNORMAL HIGH (ref 6–20)
CHLORIDE: 111 mmol/L (ref 101–111)
CO2: 19 mmol/L — AB (ref 22–32)
Calcium: 8.4 mg/dL — ABNORMAL LOW (ref 8.9–10.3)
Creatinine, Ser: 1.63 mg/dL — ABNORMAL HIGH (ref 0.44–1.00)
GFR calc non Af Amer: 30 mL/min — ABNORMAL LOW (ref >60)
GFR, EST AFRICAN AMERICAN: 35 mL/min — AB (ref >60)
Glucose, Bld: 83 mg/dL (ref 65–99)
POTASSIUM: 4 mmol/L (ref 3.5–5.1)
SODIUM: 134 mmol/L — AB (ref 135–145)

## 2015-10-31 LAB — GLIADIN ANTIBODIES, SERUM
GLIADIN IGG: 2 U (ref 0–19)
Gliadin IgA: 2 units (ref 0–19)

## 2015-10-31 LAB — TSH: TSH: 3.536 u[IU]/mL (ref 0.350–4.500)

## 2015-10-31 LAB — TISSUE TRANSGLUTAMINASE, IGA: Tissue Transglutaminase Ab, IgA: <2 U/mL (ref 0–3)

## 2015-10-31 MED ORDER — MIDODRINE HCL 5 MG PO TABS
5.0000 mg | ORAL_TABLET | Freq: Three times a day (TID) | ORAL | Status: DC
  Administered 2015-11-01 – 2015-11-02 (×4): 5 mg via ORAL
  Filled 2015-10-31 (×4): qty 1

## 2015-10-31 NOTE — Progress Notes (Addendum)
Patient up ambulating in room with assist, while washing her hands,patient lost her balance,and was assisted to the floor on her buttom. Dr Jerrell Mylar patient's daughter were notified. Vital signs were stable. No bruises or abrasions to skin noted. Will continue to monitor patient. Family at bedside.

## 2015-10-31 NOTE — Care Management Note (Signed)
Case Management Note  Patient Details  Name: Tiffany [name] MRN: [id number] Date of Birth: Dec 26, 1940  Subjective/Objective:                  Pt is from home, lives with her granddaughter and her granddaughter's husband. Pt reports being ind with ADL's and walking indepently with a walker. Pt plans to return home with self care. Pt has recommended HH PT, pt refuses stating she will be able to perform her exercises indepently once home.   Action/Plan: No CM needs.   Expected Discharge Date:   11/02/2015               Expected Discharge Plan:  Home/Self Care  In-House Referral:  NA  Discharge planning Services  CM Consult  Post Acute Care Choice:  NA Choice offered to:  NA  DME Arranged:    DME Agency:     HH Arranged:    HH Agency:     Status of Service:  Completed, signed off  If discussed at [employer] of Stay Meetings, dates discussed:    Additional Comments:  Sherald Barge, RN 10/31/2015, 1:49 PM

## 2015-10-31 NOTE — Progress Notes (Signed)
PROGRESS NOTE    Tiffany [name]  [id number] DOB: [birth year] DOA: 10/28/2015 PCP: Cleda Mccreedy, MD Outpatient Specialists:  Dermatology: Allyn Kenner, MD  Nephrology: Darrold Span. Florene Glen, MD  Cardiology: Jacqulyn Ducking, MD.   Brief Narrative:  67 yof with history of CAD s/p stent,HTN HLDM cardiomyopathy (EF 30-35%), Sojogren, RA, and chronic anemia presented with complaints of near syncope. She was noted to be significantly anemic with hemoglobin that trended down to 7. She was transfused 1 unit prbc with appropriate response, but still remains markedly orthostatic. Cortisol, TSH have been ordered. Started on midodrine. If blood pressures stabilize, anticipate discharge in the next 1-2 days.   Assessment & Plan:   Active Problems:   Anemia   Dehydration   Near syncope   CKD (chronic kidney disease) stage 4, GFR 15-29 ml/min (HCC)  1.  Anemia, of chronic disease. Likely related to renal disease. Her Hgb had been declining without any clear evidence of bleeding. FOBT in ED found to be negative. CT A/P was negative for any signs of bleeding. She is symptomatic with associated weakness and dizziness on standing. Transfusion of PRBC was delayed due to requirement of blood from [place]. She received 1 unit PRBCs 6/19. Her Hgb appears stable at this time. 2. Orthostatic hypotension. Likely the cause of near syncope. Patient's systolic blood pressure drops to 60s on standing. She does not appear to be particularly volume depleted. Will check cortisol, TSH, apply TED hose and giv a trial of midodrine.   3. Chronic systolic CHF. EF 30-35% on echo from 5/17. Appears compensated at this time. 4. Generalized weakness possibly related to anemia. PT recommend Home health PT.  5. Elevated troponin. Suspect this is demand ischemia in the setting of anemia. She does not have typical chest pain.  6. CKD Stage IV. Creatinine improved with IVF and appears to be near baseline.  7. Constipation, revealed on  CT. Continue bowel regimen.  DVT prophylaxis: SCDs Code Status: Full Family Communication: No family bedside Disposition Plan: Anticipate discharge within 24-48 hours.    Consultants:   Physical therapy  Procedures:   1 unit pRBC transfusion 6/19  Antimicrobials:   None    Subjective: Feels dizzy and standing. Feels generally weak.  Objective: Filed Vitals:   10/30/15 1138 10/30/15 1350 10/31/15 0309 10/31/15 0719  BP:  123/59 110/59 109/61  Pulse:  105 79 78  Temp:  97.9 F (36.6 C) 98 F (36.7 C) 98.1 F (36.7 C)  TempSrc:  Oral Oral Oral  Resp:   18 18  Height:      Weight:    54.568 kg (120 lb 4.8 oz)  SpO2: 100% 100% 100% 100%    Intake/Output Summary (Last 24 hours) at 10/31/15 0757 Last data filed at 10/30/15 1846  Gross per 24 hour  Intake   1250 ml  Output      0 ml  Net   1250 ml   Filed Weights   10/28/15 2300 10/30/15 0635 10/31/15 0719  Weight: 52.254 kg (115 lb 3.2 oz) 55.566 kg (122 lb 8 oz) 54.568 kg (120 lb 4.8 oz)   Examination:  General exam: Appears calm and comfortable  Respiratory system: Clear to auscultation. Respiratory effort normal. Cardiovascular system: S1 & S2 heard, RRR. No JVD, murmurs, rubs, gallops or clicks. No pedal edema. Gastrointestinal system: Abdomen is nondistended, soft and nontender. No organomegaly or masses felt. Normal bowel sounds heard. Central nervous system: Alert and oriented. No focal neurological deficits. Extremities: Symmetric 5  x 5 power. Skin: No rashes, lesions or ulcers Psychiatry: Judgement and insight appear normal. Mood & affect appropriate.   Data Reviewed: I have personally reviewed following labs and imaging studies  CBC:  Recent Labs Lab 10/28/15 1855 10/29/15 0524 10/30/15 1424 10/31/15 0554  WBC 4.9 3.3* 5.0 3.7*  NEUTROABS 3.4  --   --   --   HGB 7.9* 7.0* 8.9* 8.8*  HCT 23.8* 21.9* 27.1* 26.6*  MCV 91.5 92.0 93.1 92.4  PLT 179 164 153 [id number]   Basic Metabolic  Panel:  Recent Labs Lab 10/28/15 1855 10/29/15 0524 10/30/15 1424 10/31/15 0554  NA 133* 139 132* 134*  K 3.9 3.7 3.9 4.0  CL 111 116* 112* 111  CO2 18* 16* 17* 19*  GLUCOSE 116* 85 88 83  BUN 43* 39* 31* 29*  CREATININE 1.89* 1.73* 1.65* 1.63*  CALCIUM 8.9 8.6* 8.3* 8.4*   GFR: Estimated Creatinine Clearance: 26.5 mL/min (by C-G formula based on Cr of 1.63). Liver Function Tests:  Recent Labs Lab 10/28/15 1855 10/29/15 0524  AST 20 17  ALT 20 17  ALKPHOS 84 74  BILITOT 0.6 0.2*  PROT 7.5 6.7  ALBUMIN 3.0* 2.6*   Urine analysis:    Component Value Date/Time   COLORURINE YELLOW 10/28/2015 2023   APPEARANCEUR CLEAR 10/28/2015 2023   LABSPEC 1.020 10/28/2015 2023   PHURINE 6.0 10/28/2015 2023   GLUCOSEU NEGATIVE 10/28/2015 2023   HGBUR NEGATIVE 10/28/2015 2023   BILIRUBINUR NEGATIVE 10/28/2015 2023   KETONESUR NEGATIVE 10/28/2015 2023   PROTEINUR 30* 10/28/2015 2023   UROBILINOGEN 0.2 11/03/2014 1100   NITRITE NEGATIVE 10/28/2015 2023   LEUKOCYTESUR TRACE* 10/28/2015 2023   Sepsis Labs: @LABRCNTIP (procalcitonin:4,lacticidven:4)  ) Recent Results (from the past 240 hour(s))  MRSA PCR Screening     Status: Abnormal   Collection Time: 10/28/15 11:30 PM  Result Value Ref Range Status   MRSA by PCR POSITIVE (A) NEGATIVE Final    Comment:        The GeneXpert MRSA Assay (FDA approved for NASAL specimens only), is one component of a comprehensive MRSA colonization surveillance program. It is not intended to diagnose MRSA infection nor to guide or monitor treatment for MRSA infections. RESULT CALLED TO, READ BACK BY AND VERIFIED WITH:  LAVINDER,J @ [id number] ON 10/29/15 BY [facility]     Radiology Studies: Ct Abdomen Pelvis Wo Contrast  10/29/2015  CLINICAL DATA:  [place] 3 days ago. RIGHT lower quadrant pain, assess for retroperitoneal hematoma. History of lymphoma, lupus, chronic kidney disease. EXAM: CT ABDOMEN AND PELVIS WITHOUT CONTRAST TECHNIQUE: Multidetector  CT imaging of the abdomen and pelvis was performed following the standard protocol without IV contrast. COMPARISON:  CT abdomen and pelvis September 08, 2015 FINDINGS: LUNG BASES: Similar bilateral lower lobe bronchiectasis. Heart size is normal. Mild coronary artery calcifications. No pericardial effusion. KIDNEYS/BLADDER: Kidneys are orthotopic, relatively cyst symmetric cortical atrophy and scarring. Bilateral nephrolithiasis, RIGHT greater than LEFT measuring up to 5 mm on the RIGHT. Interval passage of LEFT lower pole 6 mm nephrolithiasis. No hydronephrosis ; limited assessment for renal masses on this nonenhanced examination. The unopacified ureters are normal in course and caliber. Urinary bladder is well distended with multiple punctate layering calculi. SOLID ORGANS: The liver, spleen, pancreas and adrenal glands are unremarkable for this non-contrast examination. Status post cholecystectomy. GASTROINTESTINAL TRACT: Small hiatal hernia. The stomach, small and large bowel are normal in course and caliber without inflammatory changes, the sensitivity may be decreased by lack of enteric  contrast. Moderate to large amount of retained large bowel stool. The appendix is not discretely identified, however there are no inflammatory changes in the right lower quadrant. PERITONEUM/RETROPERITONEUM: Aortoiliac vessels are normal in course and caliber, severe calcific atherosclerosis. No lymphadenopathy by CT size criteria. Internal reproductive organs are unremarkable. No intraperitoneal free fluid nor free air. SOFT TISSUES/ OSSEOUS STRUCTURES: Nonsuspicious. Osteopenia. Mild chronic T11 compression fracture. Stable degenerative change of the spine. Focal irregular sclerosis bilateral femoral heads. Moderate degenerative change of the hips. IMPRESSION: No acute intra-abdominal or pelvic process, no retroperitoneal hematoma. Bilateral nonobstructing nephrolithiasis and interval passage of LEFT lower pole nephrolithiasis.  Moderate to large amount of retained large bowel stool without bowel obstruction. 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 Electronically Signed   By: Elon Alas M.D.   On: 10/29/2015 18:04   Scheduled Meds: . ALPRAZolam  0.5 mg Oral TID  . amitriptyline  100 mg Oral QHS  . aspirin EC  81 mg Oral Daily  . atorvastatin  40 mg Oral QHS  . carvedilol  3.125 mg Oral BID WC  . Chlorhexidine Gluconate Cloth  6 each Topical Q0600  . cholecalciferol  400 Units Oral QPM  . cycloSPORINE  1 drop Both Eyes BID  . folic acid  1 mg Oral q morning - 10a  . hydroxychloroquine  200 mg Oral Daily  . iron polysaccharides  150 mg Oral Daily  . latanoprost  1 drop Both Eyes QHS  . levothyroxine  25 mcg Oral QAC breakfast  . medroxyPROGESTERone  10 mg Oral Daily  . milk and molasses  1 enema Rectal Once  . multivitamin-lutein   Oral Daily  . mupirocin ointment  1 application Nasal BID  . pantoprazole  40 mg Oral Daily  . potassium chloride  10 mEq Oral TID  . raloxifene  60 mg Oral q morning - 10a  . sodium bicarbonate  650 mg Oral TID  . sodium chloride flush  3 mL Intravenous Q12H  . sodium chloride flush  3 mL Intravenous Q12H   Continuous Infusions:       Time spent: 20 minutes    Kathie Dike, MD  [facility] Pager [phone]  If 7PM-7AM, please contact night-coverage [email or web address] Password [facility] 10/31/2015, 7:57 AM

## 2015-11-01 DIAGNOSIS — E860 Dehydration: Secondary | ICD-10-CM | POA: Diagnosis not present

## 2015-11-01 DIAGNOSIS — N184 Chronic kidney disease, stage 4 (severe): Secondary | ICD-10-CM

## 2015-11-01 DIAGNOSIS — I5022 Chronic systolic (congestive) heart failure: Secondary | ICD-10-CM

## 2015-11-01 DIAGNOSIS — R55 Syncope and collapse: Secondary | ICD-10-CM

## 2015-11-01 DIAGNOSIS — D638 Anemia in other chronic diseases classified elsewhere: Secondary | ICD-10-CM

## 2015-11-01 DIAGNOSIS — I255 Ischemic cardiomyopathy: Secondary | ICD-10-CM

## 2015-11-01 DIAGNOSIS — I10 Essential (primary) hypertension: Secondary | ICD-10-CM

## 2015-11-01 DIAGNOSIS — I951 Orthostatic hypotension: Secondary | ICD-10-CM

## 2015-11-01 LAB — RETICULIN ANTIBODIES, IGA W TITER: Reticulin Ab, IgA: NEGATIVE titer (ref <2.5)

## 2015-11-01 LAB — BASIC METABOLIC PANEL
Anion gap: 4 — ABNORMAL LOW (ref 5–15)
BUN: 27 mg/dL — AB (ref 6–20)
CALCIUM: 8.3 mg/dL — AB (ref 8.9–10.3)
CHLORIDE: 111 mmol/L (ref 101–111)
CO2: 19 mmol/L — ABNORMAL LOW (ref 22–32)
CREATININE: 1.78 mg/dL — AB (ref 0.44–1.00)
GFR calc non Af Amer: 27 mL/min — ABNORMAL LOW (ref >60)
GFR, EST AFRICAN AMERICAN: 31 mL/min — AB (ref >60)
Glucose, Bld: 77 mg/dL (ref 65–99)
Potassium: 4.2 mmol/L (ref 3.5–5.1)
SODIUM: 134 mmol/L — AB (ref 135–145)

## 2015-11-01 LAB — CBC
HCT: 25.8 % — ABNORMAL LOW (ref 36.0–46.0)
Hemoglobin: 8.5 g/dL — ABNORMAL LOW (ref 12.0–15.0)
MCH: 30.7 pg (ref 26.0–34.0)
MCHC: 32.9 g/dL (ref 30.0–36.0)
MCV: 93.1 fL (ref 78.0–100.0)
PLATELETS: 149 10*3/uL — AB (ref 150–400)
RBC: 2.77 MIL/uL — AB (ref 3.87–5.11)
RDW: 18.1 % — AB (ref 11.5–15.5)
WBC: 4.7 10*3/uL (ref 4.0–10.5)

## 2015-11-01 LAB — FOLATE RBC
Folate, Hemolysate: >620 ng/mL
Folate, RBC: >3039 ng/mL (ref >498)
Hematocrit: 20.4 % — ABNORMAL LOW (ref 34.0–46.6)

## 2015-11-01 LAB — CORTISOL: Cortisol, Plasma: 10.8 ug/dL

## 2015-11-01 MED ORDER — AMITRIPTYLINE HCL 25 MG PO TABS
50.0000 mg | ORAL_TABLET | Freq: Every day | ORAL | Status: DC
  Administered 2015-11-01 – 2015-11-02 (×2): 50 mg via ORAL
  Filled 2015-11-01 (×2): qty 2

## 2015-11-01 MED ORDER — SENNOSIDES-DOCUSATE SODIUM 8.6-50 MG PO TABS
1.0000 | ORAL_TABLET | Freq: Two times a day (BID) | ORAL | Status: DC
  Administered 2015-11-01 – 2015-11-03 (×5): 1 via ORAL
  Filled 2015-11-01 (×5): qty 1

## 2015-11-01 NOTE — Progress Notes (Signed)
Physical Therapy Treatment Patient Details Name: Tiffany [name] MRN: [id number] DOB: Apr 19, 1941 Today's Date: 11/01/2015    History of Present Illness 75 yo F admitted with c/o near syncope, and was anemic upon admission to ED with Hgb of 7.9 on 6/17, which continues to fall and last reported was on 6/18 and it was 7.0 with Hct of 21.9. Orders for transfusion of PRBC's. CT of the abdomen/pelvis (-) for acute intra-abdominal or pelvic process, no retroperitoneal hematoma, it does show B femoral head AVN without collapse. PMH: CAD s/p stent, HTN, cardiomyopathy (EF: 35-45%), Sjogren, RA, orthostatic hypotension, chronic anemia, CKD stage 3, lymphoma - mass excision, SLE, depression, dysphagia, heart murmur, MI, COPD, PNA, blood transfusion    PT Comments    Pt received in bed, and was agreeable to PT tx.  Pt expressed that she was very tired today.  Tx continues to be limited due to hypotension with vitals as follows during PT session today:  At rest supine BP: 87/66, HR: 86bpm After supine LE exercises, supine BP: 102/51, HR: 87bpm Seated BP: 78/47, HR: 95bpm with minimal c/o dizziness only after asked about dizziness After seated LE exercises: 97/55, HR: 98bpm, however pt c/o intense dizziness  Pt was returned to supine position.  Difficult to progress mobility at this point due to continued orthostatic hypotension.  Encouraged mobility in the bed to maintain strength and assist with BP.   Follow Up Recommendations  Home health PT;Supervision/Assistance - 24 hour     Equipment Recommendations  None recommended by PT    Recommendations for Other Services       Precautions / Restrictions Precautions Precautions: Fall Precaution Comments: Reason for admission with syncope and anemia. Had a fall 10/31/2015 in the afternoon Restrictions Weight Bearing Restrictions: No    Mobility  Bed Mobility Overal bed mobility: Modified Independent Bed Mobility: Supine to Sit;Sit to Supine      Supine to sit: Modified independent (Device/Increase time);HOB elevated Sit to supine: Modified independent (Device/Increase time)      Transfers Overall transfer level:  (Non today due to c/o dizziness with sitting and orthostatic hypotension. )                  Ambulation/Gait                 Stairs            Wheelchair Mobility    Modified Rankin (Stroke Patients Only)       Balance     Sitting balance-Leahy Scale: Good                              Cognition Arousal/Alertness: Awake/alert Behavior During Therapy: WFL for tasks assessed/performed Overall Cognitive Status: Within Functional Limits for tasks assessed                      Exercises General Exercises - Lower Extremity Ankle Circles/Pumps: AROM;Both;Supine;20 reps Short Arc Quad: Strengthening;Both;15 reps;Supine Long Arc Quad: Seated;Both;15 reps Heel Slides: Strengthening;Both;15 reps;Supine Hip ABduction/ADduction: Strengthening;Both;15 reps;Supine Straight Leg Raises: Strengthening;Both;15 reps;Supine Hip Flexion/Marching: Strengthening;Both;15 reps;Seated    General Comments        Pertinent Vitals/Pain Pain Assessment: 0-10 Pain Score: 6  Pain Location: abdomen/flank Pain Descriptors / Indicators: Aching Pain Intervention(s): Monitored during session;Repositioned    Home Living  Prior Function            PT Goals (current goals can now be found in the care plan section) Acute Rehab PT Goals Patient Stated Goal: Pt wants to go home today.  PT Goal Formulation: With patient Time For Goal Achievement: 11/06/15 Potential to Achieve Goals: Good Progress towards PT goals: Progressing toward goals    Frequency  Min 3X/week    PT Plan Current plan remains appropriate    Co-evaluation             End of Session   Activity Tolerance: Treatment limited secondary to medical complications (Comment);Other  (comment) (orthostatic hypotension) Patient left: in bed     Time: 0903-0930 PT Time Calculation (min) (ACUTE ONLY): 27 min  Charges:  $Therapeutic Exercise: 8-22 mins $Therapeutic Activity: 8-22 mins                    G Codes:      Beth Curtez Brallier, PT, DPT X: [phone]

## 2015-11-01 NOTE — Progress Notes (Signed)
PROGRESS NOTE    Tiffany [name]  [id number] DOB: 05-12-1941 DOA: 10/28/2015 PCP: Cleda Mccreedy, MD Outpatient Specialists:  Dermatology: Allyn Kenner, MD  Nephrology: Darrold Span. Florene Glen, MD  Cardiology: Jacqulyn Ducking, MD.   Brief Narrative:  75 yof with history of CAD s/p stent,HTN ischemic cardiomyopathy (EF 30-35%), Sojogren, RA, and chronic anemia presented with complaints of near syncope. She was noted to be significantly anemic with hemoglobin that trended down to 7. She was transfused 1 unit prbc with appropriate response, but still remains markedly orthostatic. Cortisol, TSH have been ordered. Started on midodrine.   Assessment & Plan:   Principal Problem:   Near syncope Active Problems:   Orthostatic hypotension   Cardiomyopathy, ischemic   Essential hypertension   Dehydration   CKD (chronic kidney disease) stage 4, GFR 15-29 ml/min (HCC)   Anemia of chronic disease   Chronic systolic CHF (congestive heart failure) ([place])     1.Near syncope secondary to acute on chronic anemia and orthostatic hypotension.  Patient's hemoglobin was 7.9 on admission, but fell to 7.0 following hydration. She was also found to be profoundly orthostatic with a blood pressure falling to the 60s when she stood up. -For treatment, she was transfused 1 unit of packed red blood cells. She was started on IV fluids. She was eventually started on Midodrin. (Per chart review, she had been treated with Florinef in the past). -She is still orthostatic, but feels better owing to the blood transfusion.  Orthostatic hypotension. The patient demonstrates profound orthostatic hypotension. Apparently she has had this before and had been started on Florinef In the past. This was held on admission in favor of Midodrine.  She was ordered TED hose. She was started on vigorous IV fluids for the first few days of hospitalization, but she is still orthostatic. -Her TSH was within normal limits. Cortisol levels  pending. -In review of her medications, she takes amitriptyline 100 mg daily at bedtime. This medication was started approximately 6 months ago.It can cause orthostatic hypotension and it may be the etiology of her orthostasis. -Rather than stopping amitriptyline completely, will titrate it down to 50 mg. Due to her age and orthostasis, would favor continuing to titrate this medication down to 25 or 12.5 mg daily at bedtime. She takes it for "sleep".  Anemia, of chronic disease.  Patient's hemoglobin on admission was 7.9. In review of her chart, her baseline hemoglobin ranges from 9.4-10.9. With hydration, it fell to 7.0. Anemia panel was unremarkable with a normal iron normal ferritin, and normal B12. Stool Hemoccult 1 was negative. CT of the abdomen and pelvis revealed no sign of bleeding. She was transfused 1 unit of packed red blood cells on 6/17 with appropriate improvement. -Etiology of her chronic anemia is likely from chronic kidney disease and her rheumatological disorders. -We'll continue to monitor.   CAD/ischemic cardiomyopathy/Mildly elevated troponin I..  Patient is treated chronically with aspirin, Lipitor, Lasix carvedilol. They were restarted. Chronic systolic CHF. EF 30-35% on echo from 5/17.  Right/left heart catheterization on 10/06/15 revealed nonobstructive CAD, Patent previous RCA stent, and a normal cardiac output, despite the low EF seen on echo. Cardiology recommended continued medical treatment. -Patient's mildly elevated troponin I was likely secondary to demand ischemia from anemia.She denies chest pain. - Her heart failure appears compensated.  CKD Stage III- IV.  Patient's baseline creatinine ranges from 1.85-2.1. It was 1.89 on admission. She was started on gentle IV fluids secondary to perceived dehydration. Her creatinine has improved and  is at baseline.    Hypothyroidism.  Patient was restarted on Synthroid. Her TSH was within normal limits.   Constipation,  revealed on CT.   Patient was given an enema with results. Will start twice a day Senokot-S.   Sjogren's/rheumatoid arthritis/lupus.  Patient is treated chronically with Plaquenil.Currently stable.  DVT prophylaxis: SCDs Code Status: Full Family Communication:  Family not available Disposition Plan: Anticipate discharge within 24-48 hours.    Consultants:   Physical therapy  Procedures:   1 unit pRBC transfusion 6/19  Antimicrobials:   None    Subjective:  Patient says that she feels better, but still gets a little dizzy when she stands. She denies headache or focal weakness.  Objective: Filed Vitals:   11/01/15 0908 11/01/15 0915 11/01/15 0923 11/01/15 1056  BP: 87/66  97/55   Pulse: 86  98   Temp:      TempSrc:      Resp:      Height:      Weight:      SpO2: 99% 95%  99%    Intake/Output Summary (Last 24 hours) at 11/01/15 1251 Last data filed at 11/01/15 0524  Gross per 24 hour  Intake      0 ml  Output    800 ml  Net   -800 ml   Filed Weights   10/30/15 0635 10/31/15 0719 11/01/15 0700  Weight: 55.566 kg (122 lb 8 oz) 54.568 kg (120 lb 4.8 oz) 52.164 kg (115 lb)   Examination:  General exam: Appears calm and comfortable; Pleasant small framed 75 year old woman in no acute distress.  Respiratory system: Clear to auscultation. Respiratory effort normal. Cardiovascular system: S1 & S2 heard with a soft systolic murmur.  No pedal edema. Gastrointestinal system: Abdomen is nondistended, soft and nontender. No organomegaly or masses felt. Normal bowel sounds heard. Central nervous system: Alert and oriented. No focal neurological deficits. Extremities: Symmetric 5 x 5 power. Skin: No rashes, lesions or ulcers Psychiatry: Judgement and insight appear normal. Mood & affect appropriate.   Data Reviewed: I have personally reviewed following labs and imaging studies  CBC:  Recent Labs Lab 10/28/15 1855 10/29/15 0524 10/30/15 1424 10/31/15 0554  11/01/15 0537  WBC 4.9 3.3* 5.0 3.7* 4.7  NEUTROABS 3.4  --   --   --   --   HGB 7.9* 7.0* 8.9* 8.8* 8.5*  HCT 23.8* 21.9* 27.1* 26.6* 25.8*  MCV 91.5 92.0 93.1 92.4 93.1  PLT 179 164 153 161 [id number]*   Basic Metabolic Panel:  Recent Labs Lab 10/28/15 1855 10/29/15 0524 10/30/15 1424 10/31/15 0554 11/01/15 0537  NA 133* 139 132* 134* 134*  K 3.9 3.7 3.9 4.0 4.2  CL 111 116* 112* 111 111  CO2 18* 16* 17* 19* 19*  GLUCOSE 116* 85 88 83 77  BUN 43* 39* 31* 29* 27*  CREATININE 1.89* 1.73* 1.65* 1.63* 1.78*  CALCIUM 8.9 8.6* 8.3* 8.4* 8.3*   GFR: Estimated Creatinine Clearance: 23.2 mL/min (by C-G formula based on Cr of 1.78). Liver Function Tests:  Recent Labs Lab 10/28/15 1855 10/29/15 0524  AST 20 17  ALT 20 17  ALKPHOS 84 74  BILITOT 0.6 0.2*  PROT 7.5 6.7  ALBUMIN 3.0* 2.6*   Urine analysis:    Component Value Date/Time   COLORURINE YELLOW 10/28/2015 2023   [place] 10/28/2015 2023   LABSPEC 1.020 10/28/2015 2023   PHURINE 6.0 10/28/2015 2023   GLUCOSEU NEGATIVE 10/28/2015 2023   HGBUR NEGATIVE 10/28/2015  [place] 10/28/2015 2023   KETONESUR NEGATIVE 10/28/2015 2023   PROTEINUR 30* 10/28/2015 2023   UROBILINOGEN 0.2 11/03/2014 1100   NITRITE NEGATIVE 10/28/2015 2023   LEUKOCYTESUR TRACE* 10/28/2015 2023   Sepsis Labs: @LABRCNTIP (procalcitonin:4,lacticidven:4)  ) Recent Results (from the past 240 hour(s))  MRSA PCR Screening     Status: Abnormal   Collection Time: 10/28/15 11:30 PM  Result Value Ref Range Status   MRSA by PCR POSITIVE (A) NEGATIVE Final    Comment:        The GeneXpert MRSA Assay (FDA approved for NASAL specimens only), is one component of a comprehensive MRSA colonization surveillance program. It is not intended to diagnose MRSA infection nor to guide or monitor treatment for MRSA infections. RESULT CALLED TO, READ BACK BY AND VERIFIED WITH:  LAVINDER,J @ 0215 ON 10/29/15 BY [facility]      Radiology Studies: No results found. Scheduled Meds: . ALPRAZolam  0.5 mg Oral TID  . amitriptyline  100 mg Oral QHS  . aspirin EC  81 mg Oral Daily  . atorvastatin  40 mg Oral QHS  . carvedilol  3.125 mg Oral BID WC  . Chlorhexidine Gluconate Cloth  6 each Topical Q0600  . cholecalciferol  400 Units Oral QPM  . cycloSPORINE  1 drop Both Eyes BID  . folic acid  1 mg Oral q morning - 10a  . hydroxychloroquine  200 mg Oral Daily  . iron polysaccharides  150 mg Oral Daily  . latanoprost  1 drop Both Eyes QHS  . levothyroxine  25 mcg Oral QAC breakfast  . medroxyPROGESTERone  10 mg Oral Daily  . midodrine  5 mg Oral TID WC  . milk and molasses  1 enema Rectal Once  . multivitamin-lutein   Oral Daily  . mupirocin ointment  1 application Nasal BID  . pantoprazole  40 mg Oral Daily  . potassium chloride  10 mEq Oral TID  . raloxifene  60 mg Oral q morning - 10a  . sodium bicarbonate  650 mg Oral TID  . sodium chloride flush  3 mL Intravenous Q12H  . sodium chloride flush  3 mL Intravenous Q12H   Continuous Infusions:       Time spent: 35 minutes    Rexene Alberts MD  [facility] Pager [phone]  If 7PM-7AM, please contact night-coverage [email or web address] Password [facility] 11/01/2015, 12:51 PM

## 2015-11-02 DIAGNOSIS — E039 Hypothyroidism, unspecified: Secondary | ICD-10-CM | POA: Diagnosis present

## 2015-11-02 DIAGNOSIS — S20211A Contusion of right front wall of thorax, initial encounter: Secondary | ICD-10-CM | POA: Diagnosis present

## 2015-11-02 DIAGNOSIS — R739 Hyperglycemia, unspecified: Secondary | ICD-10-CM | POA: Diagnosis present

## 2015-11-02 DIAGNOSIS — G8929 Other chronic pain: Secondary | ICD-10-CM | POA: Diagnosis present

## 2015-11-02 DIAGNOSIS — Z87891 Personal history of nicotine dependence: Secondary | ICD-10-CM | POA: Diagnosis not present

## 2015-11-02 DIAGNOSIS — D696 Thrombocytopenia, unspecified: Secondary | ICD-10-CM | POA: Diagnosis present

## 2015-11-02 DIAGNOSIS — Z7982 Long term (current) use of aspirin: Secondary | ICD-10-CM | POA: Diagnosis not present

## 2015-11-02 DIAGNOSIS — M329 Systemic lupus erythematosus, unspecified: Secondary | ICD-10-CM | POA: Diagnosis present

## 2015-11-02 DIAGNOSIS — W19XXXA Unspecified fall, initial encounter: Secondary | ICD-10-CM | POA: Diagnosis present

## 2015-11-02 DIAGNOSIS — J449 Chronic obstructive pulmonary disease, unspecified: Secondary | ICD-10-CM | POA: Diagnosis present

## 2015-11-02 DIAGNOSIS — N184 Chronic kidney disease, stage 4 (severe): Secondary | ICD-10-CM | POA: Diagnosis present

## 2015-11-02 DIAGNOSIS — T43015A Adverse effect of tricyclic antidepressants, initial encounter: Secondary | ICD-10-CM | POA: Diagnosis present

## 2015-11-02 DIAGNOSIS — E785 Hyperlipidemia, unspecified: Secondary | ICD-10-CM | POA: Diagnosis present

## 2015-11-02 DIAGNOSIS — I248 Other forms of acute ischemic heart disease: Secondary | ICD-10-CM | POA: Diagnosis present

## 2015-11-02 DIAGNOSIS — I252 Old myocardial infarction: Secondary | ICD-10-CM | POA: Diagnosis not present

## 2015-11-02 DIAGNOSIS — M545 Low back pain: Secondary | ICD-10-CM | POA: Diagnosis present

## 2015-11-02 DIAGNOSIS — F329 Major depressive disorder, single episode, unspecified: Secondary | ICD-10-CM | POA: Diagnosis present

## 2015-11-02 DIAGNOSIS — M3500 Sicca syndrome, unspecified: Secondary | ICD-10-CM | POA: Diagnosis present

## 2015-11-02 DIAGNOSIS — R296 Repeated falls: Secondary | ICD-10-CM | POA: Diagnosis present

## 2015-11-02 DIAGNOSIS — Z8572 Personal history of non-Hodgkin lymphomas: Secondary | ICD-10-CM | POA: Diagnosis not present

## 2015-11-02 DIAGNOSIS — I2510 Atherosclerotic heart disease of native coronary artery without angina pectoris: Secondary | ICD-10-CM | POA: Diagnosis present

## 2015-11-02 DIAGNOSIS — D631 Anemia in chronic kidney disease: Secondary | ICD-10-CM | POA: Diagnosis present

## 2015-11-02 DIAGNOSIS — Z9049 Acquired absence of other specified parts of digestive tract: Secondary | ICD-10-CM | POA: Diagnosis not present

## 2015-11-02 DIAGNOSIS — Z955 Presence of coronary angioplasty implant and graft: Secondary | ICD-10-CM | POA: Diagnosis not present

## 2015-11-02 DIAGNOSIS — I130 Hypertensive heart and chronic kidney disease with heart failure and stage 1 through stage 4 chronic kidney disease, or unspecified chronic kidney disease: Secondary | ICD-10-CM | POA: Diagnosis present

## 2015-11-02 DIAGNOSIS — D638 Anemia in other chronic diseases classified elsewhere: Secondary | ICD-10-CM | POA: Diagnosis not present

## 2015-11-02 DIAGNOSIS — I255 Ischemic cardiomyopathy: Secondary | ICD-10-CM | POA: Diagnosis not present

## 2015-11-02 DIAGNOSIS — Z961 Presence of intraocular lens: Secondary | ICD-10-CM | POA: Diagnosis present

## 2015-11-02 DIAGNOSIS — Z888 Allergy status to other drugs, medicaments and biological substances status: Secondary | ICD-10-CM | POA: Diagnosis not present

## 2015-11-02 DIAGNOSIS — K5900 Constipation, unspecified: Secondary | ICD-10-CM | POA: Diagnosis present

## 2015-11-02 DIAGNOSIS — I472 Ventricular tachycardia: Secondary | ICD-10-CM | POA: Diagnosis not present

## 2015-11-02 DIAGNOSIS — R55 Syncope and collapse: Secondary | ICD-10-CM | POA: Diagnosis not present

## 2015-11-02 DIAGNOSIS — Z9842 Cataract extraction status, left eye: Secondary | ICD-10-CM | POA: Diagnosis not present

## 2015-11-02 DIAGNOSIS — I5022 Chronic systolic (congestive) heart failure: Secondary | ICD-10-CM | POA: Diagnosis not present

## 2015-11-02 DIAGNOSIS — I951 Orthostatic hypotension: Secondary | ICD-10-CM | POA: Diagnosis present

## 2015-11-02 DIAGNOSIS — E860 Dehydration: Secondary | ICD-10-CM | POA: Diagnosis not present

## 2015-11-02 DIAGNOSIS — M069 Rheumatoid arthritis, unspecified: Secondary | ICD-10-CM | POA: Diagnosis present

## 2015-11-02 DIAGNOSIS — Z9841 Cataract extraction status, right eye: Secondary | ICD-10-CM | POA: Diagnosis not present

## 2015-11-02 DIAGNOSIS — Y92009 Unspecified place in unspecified non-institutional (private) residence as the place of occurrence of the external cause: Secondary | ICD-10-CM | POA: Diagnosis not present

## 2015-11-02 DIAGNOSIS — Z9181 History of falling: Secondary | ICD-10-CM | POA: Diagnosis not present

## 2015-11-02 LAB — TYPE AND SCREEN
ABO/RH(D): A NEG
Antibody Screen: POSITIVE
DAT, IgG: NEGATIVE
Donor AG Type: NEGATIVE
UNIT DIVISION: 0
Unit division: 0

## 2015-11-02 LAB — CBC
HEMATOCRIT: 25.4 % — AB (ref 36.0–46.0)
Hemoglobin: 8.3 g/dL — ABNORMAL LOW (ref 12.0–15.0)
MCH: 30.4 pg (ref 26.0–34.0)
MCHC: 32.7 g/dL (ref 30.0–36.0)
MCV: 93 fL (ref 78.0–100.0)
PLATELETS: 148 10*3/uL — AB (ref 150–400)
RBC: 2.73 MIL/uL — ABNORMAL LOW (ref 3.87–5.11)
RDW: 18.3 % — AB (ref 11.5–15.5)
WBC: 3.9 10*3/uL — ABNORMAL LOW (ref 4.0–10.5)

## 2015-11-02 LAB — BASIC METABOLIC PANEL
Anion gap: 4 — ABNORMAL LOW (ref 5–15)
BUN: 27 mg/dL — AB (ref 6–20)
CHLORIDE: 111 mmol/L (ref 101–111)
CO2: 20 mmol/L — AB (ref 22–32)
Calcium: 8.4 mg/dL — ABNORMAL LOW (ref 8.9–10.3)
Creatinine, Ser: 1.61 mg/dL — ABNORMAL HIGH (ref 0.44–1.00)
GFR calc Af Amer: 36 mL/min — ABNORMAL LOW (ref >60)
GFR calc non Af Amer: 31 mL/min — ABNORMAL LOW (ref >60)
GLUCOSE: 94 mg/dL (ref 65–99)
POTASSIUM: 3.9 mmol/L (ref 3.5–5.1)
Sodium: 135 mmol/L (ref 135–145)

## 2015-11-02 MED ORDER — MIDODRINE HCL 5 MG PO TABS
10.0000 mg | ORAL_TABLET | Freq: Two times a day (BID) | ORAL | Status: DC
  Administered 2015-11-02 – 2015-11-03 (×3): 10 mg via ORAL
  Filled 2015-11-02 (×3): qty 2

## 2015-11-02 MED ORDER — HYDROCORTISONE NA SUCCINATE PF 100 MG IJ SOLR
50.0000 mg | Freq: Two times a day (BID) | INTRAMUSCULAR | Status: DC
  Administered 2015-11-02 – 2015-11-03 (×3): 50 mg via INTRAVENOUS
  Filled 2015-11-02 (×3): qty 2

## 2015-11-02 NOTE — Progress Notes (Signed)
PROGRESS NOTE    Tiffany [name]  [id number] DOB: [birth year] DOA: 10/28/2015 PCP: Cleda Mccreedy, MD Outpatient Specialists:  Dermatology: Allyn Kenner, MD  Nephrology: Darrold Span. Florene Glen, MD  Cardiology: Jacqulyn Ducking, MD.   Brief Narrative:  75 yof with history of CAD s/p stent,HTN ischemic cardiomyopathy (EF 30-35%), Sojogren, RA, and chronic anemia presented with complaints of near syncope. She was noted to be significantly anemic with hemoglobin that trended down to 7. She was transfused 1 unit prbc with appropriate response, but still remains markedly orthostatic. Cortisol, TSH have been ordered. Started on midodrine.   Assessment & Plan:   Principal Problem:   Near syncope Active Problems:   Orthostatic hypotension   Cardiomyopathy, ischemic   Essential hypertension   Dehydration   CKD (chronic kidney disease) stage 4, GFR 15-29 ml/min (HCC)   Anemia of chronic disease   Chronic systolic CHF (congestive heart failure) ([place])     1.Near syncope secondary to acute on chronic anemia and orthostatic hypotension.  Patient's hemoglobin was 7.9 on admission, but fell to 7.0 following hydration. She was also found to be profoundly orthostatic with a blood pressure falling to the 60s when she stood up. -For treatment, she was transfused 1 unit of packed red blood cells. She was started on IV fluids. She was eventually started on Midodrin. (Per chart review, she had been treated with Florinef in the past). -She is still orthostatic, but feels better overall.  Orthostatic hypotension. The patient demonstrates profound orthostatic hypotension. Apparently she has had this before and had been started on Florinef In the past. This was held on admission in favor of Midodrine.  She was ordered TED hose. She was started on vigorous IV fluids for the first few days of hospitalization, but she is still orthostatic. -Her TSH was within normal limits. Cortisol levels was within normal limits. -In  review of her medications, she was taking amitriptyline 100 mg daily at bedtime. This medication was started approximately 6 months ago.It can cause orthostatic hypotension and it may be the etiology of her orthostasis. -Rather than stopping amitriptyline completely, the dose was decreased to 50 mg. Due to her age and orthostasis, would favor continuing to titrate this medication down to 25 or 12.5 mg daily at bedtime. She takes it for "sleep". -We will increase the Midrin to 10 mg twice a day. Will give empiric Solu-Cortef 50 mg every 12 hours 24-48 hours. -We'll discharge the patient when she is less symptomatic and her blood pressure does not drop so significantly below [id number] systolic.  Anemia, of chronic disease.  Patient's hemoglobin on admission was 7.9. In review of her chart, her baseline hemoglobin ranges from 9.4-10.9. With hydration, it fell to 7.0. Anemia panel was unremarkable with a normal iron normal ferritin, and normal B12. Stool Hemoccult 1 was negative. CT of the abdomen and pelvis revealed no sign of bleeding. She was transfused 1 unit of packed red blood cells on 6/17 with appropriate improvement. -Etiology of her chronic anemia is likely from chronic kidney disease and her rheumatological disorders. -We'll continue to monitor.   CAD/ischemic cardiomyopathy/Mildly elevated troponin I..  Patient is treated chronically with aspirin, Lipitor, Lasix carvedilol. They were restarted. Chronic systolic CHF. EF 30-35% on echo from 5/17.  Right/left heart catheterization on 10/06/15 revealed nonobstructive CAD, Patent previous RCA stent, and a normal cardiac output, despite the low EF seen on echo. Cardiology recommended continued medical treatment. -Patient's mildly elevated troponin I was likely secondary to demand ischemia from  anemia.She denies chest pain. - Her heart failure appears compensated.  CKD Stage III- IV.  Patient's baseline creatinine ranges from 1.85-2.1. It was 1.89 on  admission. She was started on gentle IV fluids secondary to perceived dehydration. Her creatinine has improved and is at baseline.    Hypothyroidism.  Patient was restarted on Synthroid. Her TSH was within normal limits.   Constipation, revealed on CT.   Patient was given an enema with results. Senokot-S twice a day was started.    Sjogren's/rheumatoid arthritis/lupus.  Patient is treated chronically with Plaquenil.Currently stable.  Mild thrombocytopenia. Patient's platelet count was within normal limits on admission. It has gradually fallen to 148. The decrease may be hemodilutional. We'll continue to follow.    DVT prophylaxis: SCDs Code Status: Full Family Communication:  Family not available Disposition Plan: Anticipate discharge when her orthostatics have improved.    Consultants:   Physical therapy  Procedures:   1 unit pRBC transfusion 6/19  Antimicrobials:   None    Subjective:  Patient complained of dizziness when she stood up and her blood pressure fell to the 0000000 systolically this morning per nursing. Currently, she denies dizziness, chest pain, or shortness of breath at rest.   Objective: Filed Vitals:   11/01/15 1735 11/01/15 2108 11/02/15 0500 11/02/15 0647  BP: 106/58 108/61  110/68  Pulse: 87 94  84  Temp: 98.9 F (37.2 C) 98.1 F (36.7 C)  98 F (36.7 C)  TempSrc: Oral Oral  Oral  Resp: 20 20  20   Height:      Weight:   52.8 kg (116 lb 6.5 oz)   SpO2: 100% 100%  100%    Intake/Output Summary (Last 24 hours) at 11/02/15 0926 Last data filed at 11/02/15 [id number]  Gross per 24 hour  Intake      0 ml  Output    500 ml  Net   -500 ml   Filed Weights   10/31/15 0719 11/01/15 0700 11/02/15 0500  Weight: 54.568 kg (120 lb 4.8 oz) 52.164 kg (115 lb) 52.8 kg (116 lb 6.5 oz)   Examination:  General exam: Appears calm and comfortable; Pleasant small framed 75 year old woman in no acute distress.  Respiratory system: Clear to auscultation.  Respiratory effort normal. Cardiovascular system: S1 & S2 heard with a soft systolic murmur.  No pedal edema. Gastrointestinal system: Abdomen is nondistended, soft and nontender. No organomegaly or masses felt. Normal bowel sounds heard. Central nervous system: Alert and oriented. No focal neurological deficits. Extremities: Symmetric 5 x 5 power. Skin: No rashes, lesions or ulcers Psychiatry: Judgement and insight appear normal. Mood & affect appropriate.   Data Reviewed: I have personally reviewed following labs and imaging studies  CBC:  Recent Labs Lab 10/28/15 1855 10/29/15 0524 10/30/15 1424 10/31/15 0554 11/01/15 0537 11/02/15 0528  WBC 4.9 3.3* 5.0 3.7* 4.7 3.9*  NEUTROABS 3.4  --   --   --   --   --   HGB 7.9* 7.0* 8.9* 8.8* 8.5* 8.3*  HCT 23.8* 21.9*  20.4* 27.1* 26.6* 25.8* 25.4*  MCV 91.5 92.0 93.1 92.4 93.1 93.0  PLT 179 164 153 161 149* 123456*   Basic Metabolic Panel:  Recent Labs Lab 10/29/15 0524 10/30/15 1424 10/31/15 0554 11/01/15 0537 11/02/15 0528  NA 139 132* 134* 134* 135  K 3.7 3.9 4.0 4.2 3.9  CL 116* 112* 111 111 111  CO2 16* 17* 19* 19* 20*  GLUCOSE 85 88 83 77 94  BUN 39*  31* 29* 27* 27*  CREATININE 1.73* 1.65* 1.63* 1.78* 1.61*  CALCIUM 8.6* 8.3* 8.4* 8.3* 8.4*   GFR: Estimated Creatinine Clearance: 25.9 mL/min (by C-G formula based on Cr of 1.61). Liver Function Tests:  Recent Labs Lab 10/28/15 1855 10/29/15 0524  AST 20 17  ALT 20 17  ALKPHOS 84 74  BILITOT 0.6 0.2*  PROT 7.5 6.7  ALBUMIN 3.0* 2.6*   Urine analysis:    Component Value Date/Time   COLORURINE YELLOW 10/28/2015 2023   APPEARANCEUR CLEAR 10/28/2015 2023   LABSPEC 1.020 10/28/2015 2023   PHURINE 6.0 10/28/2015 2023   GLUCOSEU NEGATIVE 10/28/2015 2023   HGBUR NEGATIVE 10/28/2015 2023   BILIRUBINUR NEGATIVE 10/28/2015 2023   KETONESUR NEGATIVE 10/28/2015 2023   PROTEINUR 30* 10/28/2015 2023   UROBILINOGEN 0.2 11/03/2014 1100   NITRITE NEGATIVE 10/28/2015  2023   LEUKOCYTESUR TRACE* 10/28/2015 2023   Sepsis Labs: @LABRCNTIP (procalcitonin:4,lacticidven:4)  ) Recent Results (from the past 240 hour(s))  MRSA PCR Screening     Status: Abnormal   Collection Time: 10/28/15 11:30 PM  Result Value Ref Range Status   MRSA by PCR POSITIVE (A) NEGATIVE Final    Comment:        The GeneXpert MRSA Assay (FDA approved for NASAL specimens only), is one component of a comprehensive MRSA colonization surveillance program. It is not intended to diagnose MRSA infection nor to guide or monitor treatment for MRSA infections. RESULT CALLED TO, READ BACK BY AND VERIFIED WITH:  LAVINDER,J @ 0215 ON 10/29/15 BY [facility]     Radiology Studies: No results found. Scheduled Meds: . ALPRAZolam  0.5 mg Oral TID  . amitriptyline  50 mg Oral QHS  . aspirin EC  81 mg Oral Daily  . atorvastatin  40 mg Oral QHS  . carvedilol  3.125 mg Oral BID WC  . Chlorhexidine Gluconate Cloth  6 each Topical Q0600  . cholecalciferol  400 Units Oral QPM  . cycloSPORINE  1 drop Both Eyes BID  . folic acid  1 mg Oral q morning - 10a  . hydroxychloroquine  200 mg Oral Daily  . iron polysaccharides  150 mg Oral Daily  . latanoprost  1 drop Both Eyes QHS  . levothyroxine  25 mcg Oral QAC breakfast  . medroxyPROGESTERone  10 mg Oral Daily  . midodrine  5 mg Oral TID WC  . milk and molasses  1 enema Rectal Once  . multivitamin-lutein   Oral Daily  . mupirocin ointment  1 application Nasal BID  . pantoprazole  40 mg Oral Daily  . potassium chloride  10 mEq Oral TID  . raloxifene  60 mg Oral q morning - 10a  . senna-docusate  1 tablet Oral BID  . sodium bicarbonate  650 mg Oral TID  . sodium chloride flush  3 mL Intravenous Q12H  . sodium chloride flush  3 mL Intravenous Q12H   Continuous Infusions:       Time spent: 25 minutes    Rexene Alberts MD  [facility] Pager [phone]  If 7PM-7AM, please contact night-coverage [email or web address] Password  [facility] 11/02/2015, 9:26 AM

## 2015-11-03 DIAGNOSIS — E860 Dehydration: Secondary | ICD-10-CM

## 2015-11-03 DIAGNOSIS — M329 Systemic lupus erythematosus, unspecified: Principal | ICD-10-CM

## 2015-11-03 LAB — CBC
HCT: 26.4 % — ABNORMAL LOW (ref 36.0–46.0)
Hemoglobin: 8.8 g/dL — ABNORMAL LOW (ref 12.0–15.0)
MCH: 31.3 pg (ref 26.0–34.0)
MCHC: 33.3 g/dL (ref 30.0–36.0)
MCV: 94 fL (ref 78.0–100.0)
PLATELETS: 168 10*3/uL (ref 150–400)
RBC: 2.81 MIL/uL — AB (ref 3.87–5.11)
RDW: 18 % — ABNORMAL HIGH (ref 11.5–15.5)
WBC: 3.8 10*3/uL — ABNORMAL LOW (ref 4.0–10.5)

## 2015-11-03 MED ORDER — MIDODRINE HCL 10 MG PO TABS: ORAL_TABLET | ORAL | Status: DC

## 2015-11-03 MED ORDER — ATORVASTATIN CALCIUM 40 MG PO TABS
40.0000 mg | ORAL_TABLET | Freq: Every morning | ORAL | Status: DC
Start: 2015-11-03 — End: 2016-07-20

## 2015-11-03 MED ORDER — AMITRIPTYLINE HCL 50 MG PO TABS
ORAL_TABLET | ORAL | Status: DC
Start: 2015-11-03 — End: 2016-07-03

## 2015-11-03 MED ORDER — SENNOSIDES-DOCUSATE SODIUM 8.6-50 MG PO TABS: 1.0000 | ORAL_TABLET | Freq: Two times a day (BID) | ORAL | Status: AC

## 2015-11-03 MED ORDER — NITROGLYCERIN 0.4 MG SL SUBL: 0.4000 mg | SUBLINGUAL_TABLET | SUBLINGUAL | Status: DC | PRN

## 2015-11-03 MED ORDER — PREDNISONE 10 MG PO TABS: 10.0000 mg | ORAL_TABLET | Freq: Every day | ORAL | Status: DC

## 2015-11-03 NOTE — Care Management Important Message (Signed)
Important Message  Patient Details  Name: Tiffany [name] MRN: [id number] Date of Birth: 1941-03-30   Medicare Important Message Given:  Yes    Sherald Barge, RN 11/03/2015, 9:16 AM

## 2015-11-03 NOTE — Discharge Summary (Addendum)
Physician Discharge Summary  Tiffany [name] [id number] DOB: Feb 08, 1941 DOA: 10/28/2015  PCP: Cleda Mccreedy, MD  Admit date: 10/28/2015 Discharge date: 11/03/2015  Time spent: Greater than 30 minutes  Recommendations for Outpatient Follow-up:  1. Recommend follow-up of the patient's blood pressure at rest and when standing to reassess orthostatic hypotension on Midodrine  2. Recommend further tapering of amitriptyline to 12.5-25 mg daily at bedtime if needed for sleep. 3. Recommend follow-up of the patient's CBC and renal function.    Discharge Diagnoses:  1. Near syncope secondary to acute on chronic anemia and orthostatic hypotension 2. Orthostatic hypotension, possibly secondary to amitriptyline. 3. Acute on chronic anemia of chronic diseases, secondary to CKD and SLE. 4. CAD/ischemic cardiomyopathy/mildly elevated troponin I secondary to demand ischemia. 5. Stage III-IV chronic kidney disease. 6. Hypothyroidism. 7. Constipation, revealed on CT. 8. Sjogren's/SLE/rheumatoid arthritis. 9. Mild thrombocytopenia.  Discharge Condition: Improved   Diet recommendation: Heart healthy  Filed Weights   11/01/15 0700 11/02/15 0500 11/03/15 0557  Weight: 52.164 kg (115 lb) 52.8 kg (116 lb 6.5 oz) 52.6 kg (115 lb 15.4 oz)    History of present illness:  Patient is a 75 year old woman with history of CAD s/p stent,HTN ischemic cardiomyopathy, Sojogren, RA, and chronic anemia who presented with complaints of near syncope. She was noted to be significantly orthostatic. She was found to be anemic. She was admitted for further evaluation and management.   Hospital Course:  1.Near syncope secondary to acute on chronic anemia and orthostatic hypotension. Patient's hemoglobin was 7.9 on admission, but fell to 7.0 following hydration. She was also found to be profoundly orthostatic with a blood pressure falling to the 60s when she stood up. -For treatment, she was transfused 1 unit of packed  red blood cells. She was started on IV fluids. She was eventually started on Midodrin. (Per chart review, she had been treated with Florinef in the past). -Patient's orthostatic vital signs improved, but her blood pressure systolically standing had improved. She became asymptomatic. She was discharged on Midodrine  Orthostatic hypotension. The patient had profound orthostatic hypotension with a blood pressure falling to the 0000000 systolically when standing.Marland Kitchen Apparently she has had this before and had been started on Florinef In the past. This was held on admission in favor of Midodrine. She was ordered TED hose. She was started on vigorous IV fluids for the first few days of hospitalization, but she was still orthostatic. -Her TSH was within normal limits. Cortisol level was within normal limits. Although her cortisol level was normal, she was given a couple doses of Solu-Cortef empirically. Carvedilol was held. -Per review, she had been taking amitriptyline 100 mg daily at bedtime. This medication was started approximately 6 months ago.It can cause orthostatic hypotension and it may be the etiology of her orthostasis. -Rather than stopping amitriptyline completely, the dose was decreased to 50 mg. Due to her age and orthostasis, would favor continuing to titrate this medication down to 25 or 12.5 mg daily at bedtime. She takes it for "sleep". -Orthostatic vital signs improved and her standing blood pressure decreased to the [id number] systolically, rather than the 60s. She had no dizziness or presyncopal symptoms at discharge. -She was discharged on Midodrine and a brief prednisone taper. She was instructed to not take carvedilol.  Anemia, of chronic disease. Patient's hemoglobin on admission was 7.9. In review, her baseline hemoglobin ranges from 9.4-10.9. With hydration, it fell to 7.0. Anemia panel was unremarkable with a normal iron normal ferritin, and normal  B12. Stool Hemoccult 1 was negative. CT of  the abdomen and pelvis revealed no signs/sources of bleeding. She was transfused 1 unit of packed red blood cells on 6/17 with appropriate improvement. -Etiology of her chronic anemia is likely from chronic kidney disease and her rheumatological disorders. Hemoglobin was 8.8 at discharge.  CAD/ischemic cardiomyopathy/Mildly elevated troponin I.. Patient is treated chronically with aspirin, Lipitor, Lasix carvedilol. They were restarted. Her EF was 30-35% on echo from 5/17. Right/left heart catheterization on 10/06/15 revealed nonobstructive CAD, patent previous RCA stent, and a normal cardiac output, despite the low EF seen on echo. -Patient's mildly elevated troponin I was likely secondary to demand ischemia from anemia.She denied chest pain. -She had no signs of congestive heart failure. -Carvedilol had to be discontinued due to her chronically low-normal blood pressures and orthostatic hypotension. Recommend assessment of restarting carvedilol pending improvement of her orthostatic hypotension.  CKD Stage III- IV.  Patient's baseline creatinine ranges from 1.85-2.1. It was 1.89 on admission. She was started on gentle IV fluids secondary to perceived dehydration. Her creatinine improved to 1.61.   Hypothyroidism. Patient was restarted on Synthroid. Her TSH was within normal limits.  Constipation, revealed on CT.  Patient was given an enema with results. Senokot-S twice a day was started.   Sjogren's/rheumatoid arthritis/lupus. Patient is treated chronically with Plaquenil; it was restarted. Her rheumatological conditions remained stable.  Mild thrombocytopenia. Patient's platelet count was within normal limits on admission. It has gradually fallen to 148. The decrease may have been hemodilutional. Her platelet count eventually normalized.    Procedures:  Transfusion of 1 unit of packed red blood cells on 10/30/15.  Consultations:  None  Discharge Exam: Filed Vitals:    11/03/15 0557 11/03/15 0933  BP: 107/56   Pulse: 84   Temp: 98.1 F (36.7 C) 98.4 F (36.9 C)  Resp: 20   Oxygen saturation 100% on room air.  General: Pleasant alert 75 year old woman in no acute distress. Cardiovascular: S1, S2, with soft systolic murmur. No pedal edema. Respiratory: Clear to auscultation bilaterally. Neurologic: She is alert and oriented 3. Cranial nerves II through XII are intact.  Discharge Instructions   Discharge Instructions    Diet - low sodium heart healthy    Complete by:  As directed      Discharge instructions    Complete by:  As directed   1. Stand slowly before walking. 2. Avoid dehydration. 3. Follow-up with your doctors.     Increase activity slowly    Complete by:  As directed           Current Discharge Medication List    START taking these medications   Details  midodrine (PROAMATINE) 10 MG tablet Take 2 tablets each morning and 1 tablet at bedtime, to help keep your blood pressure from dropping too much. Qty: 90 tablet, Refills: 1    predniSONE (DELTASONE) 10 MG tablet Take 1 tablet (10 mg total) by mouth daily with breakfast. Qty: 5 tablet, Refills: 0    senna-docusate (SENOKOT-S) 8.6-50 MG tablet Take 1 tablet by mouth 2 (two) times daily. For constipation.      CONTINUE these medications which have CHANGED   Details  amitriptyline (ELAVIL) 50 MG tablet Take one tablet each night for 6 more days; then take 1 tablet every other night until further instructions by your primary care physician. Qty: 30 tablet, Refills: 0    atorvastatin (LIPITOR) 40 MG tablet Take 1 tablet (40 mg total) by mouth every morning.  nitroGLYCERIN (NITROSTAT) 0.4 MG SL tablet Place 1 tablet (0.4 mg total) under the tongue every 5 (five) minutes as needed for chest pain.      CONTINUE these medications which have NOT CHANGED   Details  acetaminophen (TYLENOL) 325 MG tablet Take 2 tablets (650 mg total) by mouth every 6 (six) hours as needed for  mild pain (or Fever >/= 101). Qty: 30 tablet, Refills: 0    albuterol (PROVENTIL) (2.5 MG/3ML) 0.083% nebulizer solution Take 3 mLs (2.5 mg total) by nebulization every 6 (six) hours as needed for wheezing or shortness of breath. Qty: 75 mL, Refills: 12    ALPRAZolam (XANAX) 0.5 MG tablet Take 0.5 mg by mouth 3 (three) times daily.     Artificial Tear Solution (SOOTHE XP OP) Apply 1 drop to eye 3 (three) times daily.     aspirin 81 MG tablet Take 81 mg by mouth daily.    Biotin 5000 MCG CAPS Take 5,000 mcg by mouth daily.    Cholecalciferol (VITAMIN D) 400 UNITS capsule Take 400 Units by mouth every evening.     clobetasol cream (TEMOVATE) [id number] % Apply 1 application topically 2 (two) times daily.    cycloSPORINE (RESTASIS) 0.05 % ophthalmic emulsion Place 1 drop into both eyes 2 (two) times daily.    docusate sodium (COLACE) 100 MG capsule Take 100 mg by mouth 2 (two) times daily as needed for mild constipation.    FERREX 150 150 MG capsule Take 150 mg by mouth daily. Refills: 5    folic acid (FOLVITE) 1 MG tablet Take 1 mg by mouth every morning.     furosemide (LASIX) 20 MG tablet Take 1 tablet by mouth daily as needed for fluid.  Refills: 1    HYDROcodone-acetaminophen (NORCO/VICODIN) 5-325 MG per tablet Take 1 tablet by mouth 3 (three) times daily as needed for moderate pain.     hydroxychloroquine (PLAQUENIL) 200 MG tablet Take 200 mg by mouth daily. To be taken with food or milk    KLOR-CON 10 10 MEQ tablet Take 10 mEq by mouth 3 (three) times daily. Refills: 3    levothyroxine (SYNTHROID, LEVOTHROID) 25 MCG tablet Take 25 mcg by mouth every morning.     medroxyPROGESTERone (PROVERA) 10 MG tablet Take 1 tablet (10 mg total) by mouth daily. Qty: 30 tablet, Refills: 11    Multiple Vitamins-Minerals (PRESERVISION/LUTEIN PO) Take 1 capsule by mouth daily.    omeprazole (PRILOSEC) 20 MG capsule Take 20 mg by mouth daily.     raloxifene (EVISTA) 60 MG tablet Take 60 mg  by mouth every morning.     sodium bicarbonate 325 MG tablet Take 650 mg by mouth 3 (three) times daily.     Travoprost, BAK Free, (TRAVATAN) 0.004 % SOLN ophthalmic solution Place 1 drop into both eyes at bedtime.      STOP taking these medications     carvedilol (COREG) 3.125 MG tablet      fludrocortisone (FLORINEF) 0.1 MG tablet      Multiple Vitamin (MULTIVITAMIN) tablet      levofloxacin (LEVAQUIN) 750 MG tablet        Allergies  Allergen Reactions  . Florinef [Fludrocortisone Acetate]     Malaise: Discomfort   . Iohexol Other (See Comments)    Pt. States that Dr. Florene Glen her kidney Dr. Does not want her to receive iv dye due to increased risk of kidney failure.   . Sulfonamide Derivatives Hives  . Tape Rash  ADHESIVE   Follow-up Information    Follow up with Cleda Mccreedy, MD In 1 week.   Specialty:  Internal Medicine   Why:  Call office for appointment    Contact information:   [place] [phone]        The results of significant diagnostics from this hospitalization (including imaging, microbiology, ancillary and laboratory) are listed below for reference.    Significant Diagnostic Studies: Ct Abdomen Pelvis Wo Contrast  10/29/2015  CLINICAL DATA:  [place] 3 days ago. RIGHT lower quadrant pain, assess for retroperitoneal hematoma. History of lymphoma, lupus, chronic kidney disease. EXAM: CT ABDOMEN AND PELVIS WITHOUT CONTRAST TECHNIQUE: Multidetector CT imaging of the abdomen and pelvis was performed following the standard protocol without IV contrast. COMPARISON:  CT abdomen and pelvis September 08, 2015 FINDINGS: LUNG BASES: Similar bilateral lower lobe bronchiectasis. Heart size is normal. Mild coronary artery calcifications. No pericardial effusion. KIDNEYS/BLADDER: Kidneys are orthotopic, relatively cyst symmetric cortical atrophy and scarring. Bilateral nephrolithiasis, RIGHT greater than LEFT measuring up to 5 mm on the  RIGHT. Interval passage of LEFT lower pole 6 mm nephrolithiasis. No hydronephrosis ; limited assessment for renal masses on this nonenhanced examination. The unopacified ureters are normal in course and caliber. Urinary bladder is well distended with multiple punctate layering calculi. SOLID ORGANS: The liver, spleen, pancreas and adrenal glands are unremarkable for this non-contrast examination. Status post cholecystectomy. GASTROINTESTINAL TRACT: Small hiatal hernia. The stomach, small and large bowel are normal in course and caliber without inflammatory changes, the sensitivity may be decreased by lack of enteric contrast. Moderate to large amount of retained large bowel stool. The appendix is not discretely identified, however there are no inflammatory changes in the right lower quadrant. PERITONEUM/RETROPERITONEUM: Aortoiliac vessels are normal in course and caliber, severe calcific atherosclerosis. No lymphadenopathy by CT size criteria. Internal reproductive organs are unremarkable. No intraperitoneal free fluid nor free air. SOFT TISSUES/ OSSEOUS STRUCTURES: Nonsuspicious. Osteopenia. Mild chronic T11 compression fracture. Stable degenerative change of the spine. Focal irregular sclerosis bilateral femoral heads. Moderate degenerative change of the hips. IMPRESSION: No acute intra-abdominal or pelvic process, no retroperitoneal hematoma. Bilateral nonobstructing nephrolithiasis and interval passage of LEFT lower pole nephrolithiasis. Moderate to large amount of retained large bowel stool without bowel obstruction. 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 Electronically Signed   By: Elon Alas M.D.   On: 10/29/2015 18:04   Dg Ribs Unilateral W/chest Right  10/28/2015  CLINICAL DATA:  Generalized right rib pain after fall 2 days prior. Difficulty speaking. EXAM: RIGHT RIBS AND CHEST - 3+ VIEW COMPARISON:  Chest radiograph 09/28/2015 FINDINGS: No evidence of acute right rib  fracture. Cortical margins are intact. The lungs remain hyperinflated. No pleural effusion, pneumothorax or new focal airspace opacity. Bibasilar opacities, left greater than right, stable and likely scarring. IMPRESSION: No evidence of acute right rib fracture or acute intrathoracic process. Electronically Signed   By: Jeb Levering M.D.   On: 10/28/2015 20:49   Ct Head Wo Contrast  10/28/2015  CLINICAL DATA:  Fall, patient hit forehead 3 days ago. Complains of frontal head pain and headache intermittently. Pt states pain to right side of ribs, believes she "cracked a rib" when she fell 3 days ago. Pain with deep inhalation. EXAM: CT HEAD WITHOUT CONTRAST TECHNIQUE: Contiguous axial images were obtained from the base of the skull through the vertex without intravenous contrast. COMPARISON:  04/13/2011 FINDINGS: The ventricles are normal in configuration there is mild  ventricular enlargement reflecting age related volume loss. There are no parenchymal masses or mass effect. 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 There is no evidence of a recent infarct. There are no extra-axial masses or abnormal fluid collections. There is no intracranial hemorrhage. Tendons secretions are noted in the right sphenoid sinus. Remaining sinuses are clear. Clear mastoid air cells. No skull fracture. IMPRESSION: 1. No acute intracranial abnormalities. 2. 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 Electronically Signed   By: Lajean Manes M.D.   On: 10/28/2015 20:53    Microbiology: Recent Results (from the past 240 hour(s))  MRSA PCR Screening     Status: Abnormal   Collection Time: 10/28/15 11:30 PM  Result Value Ref Range Status   MRSA by PCR POSITIVE (A) NEGATIVE Final    Comment:        The GeneXpert MRSA Assay (FDA approved for NASAL specimens only), is one component of a comprehensive MRSA colonization surveillance program. It is not intended  to diagnose MRSA infection nor to guide or monitor treatment for MRSA infections. RESULT CALLED TO, READ BACK BY AND VERIFIED WITH:  LAVINDER,J @ 0215 ON 10/29/15 BY WOODIE,J      Labs: Basic Metabolic Panel:  Recent Labs Lab 10/29/15 0524 10/30/15 1424 10/31/15 0554 11/01/15 0537 11/02/15 0528  NA 139 132* 134* 134* 135  K 3.7 3.9 4.0 4.2 3.9  CL 116* 112* 111 111 111  CO2 16* 17* 19* 19* 20*  GLUCOSE 85 88 83 77 94  BUN 39* 31* 29* 27* 27*  CREATININE 1.73* 1.65* 1.63* 1.78* 1.61*  CALCIUM 8.6* 8.3* 8.4* 8.3* 8.4*   Liver Function Tests:  Recent Labs Lab 10/28/15 1855 10/29/15 0524  AST 20 17  ALT 20 17  ALKPHOS 84 74  BILITOT 0.6 0.2*  PROT 7.5 6.7  ALBUMIN 3.0* 2.6*   No results for input(s): LIPASE, AMYLASE in the last 168 hours. No results for input(s): AMMONIA in the last 168 hours. CBC:  Recent Labs Lab 10/28/15 1855  10/30/15 1424 10/31/15 0554 11/01/15 0537 11/02/15 0528 11/03/15 0558  WBC 4.9  < > 5.0 3.7* 4.7 3.9* 3.8*  NEUTROABS 3.4  --   --   --   --   --   --   HGB 7.9*  < > 8.9* 8.8* 8.5* 8.3* 8.8*  HCT 23.8*  < > 27.1* 26.6* 25.8* 25.4* 26.4*  MCV 91.5  < > 93.1 92.4 93.1 93.0 94.0  PLT 179  < > 153 161 149* 148* 168  < > = values in this interval not displayed. Cardiac Enzymes:  Recent Labs Lab 10/29/15 0524  TROPONINI 0.06*   BNP: BNP (last 3 results)  Recent Labs  09/16/15 1115  BNP 187.6*    ProBNP (last 3 results) No results for input(s): PROBNP in the last 8760 hours.  CBG: No results for input(s): GLUCAP in the last 168 hours.     Signed:  Kaiyden Simkin MD.  [facility] 11/03/2015, 11:40 AM

## 2015-11-03 NOTE — Care Management Note (Signed)
Case Management Note  Patient Details  Name: Tiffany [name] MRN: [id number] Date of Birth: [birth year]   Expected Discharge Date:    11/03/2015              Expected Discharge Plan:  Home/Self Care  In-House Referral:  NA  Discharge planning Services  CM Consult  Post Acute Care Choice:  NA Choice offered to:  NA  DME Arranged:    DME Agency:     HH Arranged:    [place] Agency:     Status of Service:  Completed, signed off  If discussed at [employer] of Stay Meetings, dates discussed:    Additional Comments: Pt discharging home with self care today. Pt continues to not want [place] services.  Sherald Barge, RN 11/03/2015, 12:24 PM

## 2015-11-09 ENCOUNTER — Telehealth: Payer: Self-pay | Admitting: Adult Health

## 2015-11-09 NOTE — Telephone Encounter (Signed)
Patient had heart cath since stress test,needs fu apt with K lawrence NP, have messaged front desk to schedule

## 2015-11-09 NOTE — Telephone Encounter (Signed)
-----   Message from Bernita Raisin, RN sent at 11/09/2015  8:34 AM EDT ----- Regarding: needs post hosp fu with KL needs post catha and post hosp fu  ty

## 2015-11-09 NOTE — Telephone Encounter (Signed)
Noted-C.Ceylin Dreibelbis RN 

## 2015-11-09 NOTE — Telephone Encounter (Signed)
I reviewed her stress test and it was found to be abnormal. She will need to be seen to discuss need for cardiac cath.

## 2015-11-09 NOTE — Telephone Encounter (Signed)
Patient states that she is going to have daughter to call to make appointment. / tg

## 2015-11-13 DIAGNOSIS — Z79899 Other long term (current) drug therapy: Secondary | ICD-10-CM | POA: Diagnosis not present

## 2015-11-13 DIAGNOSIS — D649 Anemia, unspecified: Secondary | ICD-10-CM | POA: Diagnosis not present

## 2015-11-13 DIAGNOSIS — R531 Weakness: Secondary | ICD-10-CM | POA: Diagnosis not present

## 2015-11-13 DIAGNOSIS — N181 Chronic kidney disease, stage 1: Secondary | ICD-10-CM | POA: Diagnosis not present

## 2015-11-13 DIAGNOSIS — N189 Chronic kidney disease, unspecified: Secondary | ICD-10-CM | POA: Diagnosis not present

## 2015-11-13 DIAGNOSIS — R5381 Other malaise: Secondary | ICD-10-CM | POA: Diagnosis not present

## 2015-11-21 ENCOUNTER — Encounter (HOSPITAL_COMMUNITY): Payer: Self-pay | Admitting: Emergency Medicine

## 2015-11-21 DIAGNOSIS — E785 Hyperlipidemia, unspecified: Secondary | ICD-10-CM | POA: Insufficient documentation

## 2015-11-21 DIAGNOSIS — N183 Chronic kidney disease, stage 3 (moderate): Secondary | ICD-10-CM | POA: Diagnosis not present

## 2015-11-21 DIAGNOSIS — I2510 Atherosclerotic heart disease of native coronary artery without angina pectoris: Secondary | ICD-10-CM | POA: Insufficient documentation

## 2015-11-21 DIAGNOSIS — Z7982 Long term (current) use of aspirin: Secondary | ICD-10-CM | POA: Diagnosis not present

## 2015-11-21 DIAGNOSIS — Z79899 Other long term (current) drug therapy: Secondary | ICD-10-CM | POA: Diagnosis not present

## 2015-11-21 DIAGNOSIS — K5900 Constipation, unspecified: Secondary | ICD-10-CM | POA: Insufficient documentation

## 2015-11-21 DIAGNOSIS — I252 Old myocardial infarction: Secondary | ICD-10-CM | POA: Insufficient documentation

## 2015-11-21 DIAGNOSIS — Z87891 Personal history of nicotine dependence: Secondary | ICD-10-CM | POA: Diagnosis not present

## 2015-11-21 DIAGNOSIS — F329 Major depressive disorder, single episode, unspecified: Secondary | ICD-10-CM | POA: Diagnosis not present

## 2015-11-21 DIAGNOSIS — J449 Chronic obstructive pulmonary disease, unspecified: Secondary | ICD-10-CM | POA: Diagnosis not present

## 2015-11-21 NOTE — ED Notes (Signed)
Pt states it has been about one week since she has had a normal bm.

## 2015-11-22 ENCOUNTER — Emergency Department (HOSPITAL_COMMUNITY)
Admission: EM | Admit: 2015-11-22 | Discharge: 2015-11-22 | Disposition: A | Discharge status: Home / Self Care | Payer: Medicare Other | Attending: Emergency Medicine | Admitting: Emergency Medicine

## 2015-11-22 ENCOUNTER — Emergency Department (HOSPITAL_COMMUNITY): Payer: Medicare Other

## 2015-11-22 DIAGNOSIS — K5900 Constipation, unspecified: Secondary | ICD-10-CM

## 2015-11-22 MED ORDER — BISACODYL 10 MG RE SUPP
10.0000 mg | Freq: Once | RECTAL | Status: AC
  Administered 2015-11-22: 10 mg via RECTAL
  Filled 2015-11-22: qty 1

## 2015-11-22 MED ORDER — FLEET ENEMA 7-19 GM/118ML RE ENEM
1.0000 | ENEMA | Freq: Once | RECTAL | Status: AC
  Administered 2015-11-22: 1 via RECTAL

## 2015-11-22 NOTE — ED Notes (Signed)
Pt had large BM and states she feels much better.

## 2015-11-22 NOTE — ED Provider Notes (Signed)
CSN: [id number]     Arrival date & time 11/21/15  2338 History   First MD Initiated Contact with Patient 11/22/15 0145 AM   Chief Complaint  Patient presents with  . Constipation     (Consider location/radiation/quality/duration/timing/severity/associated sxs/prior Treatment) HPI patient states she has been having constant constipation and she has to take milk of magnesia about every other day to have a bowel movement. However she states her last bowel movement was 3 days ago. She states about 8 PM this evening she felt the need to go to the bathroom to have a bowel movement and she had a large ball that got stuck. She states it wouldn't go back in and it wouldn't go out. She had to use bathroom tissue to help get it to pass. She states she has some mild lower abdominal discomfort. She does not feel like there is something else stuck in her rectum but states she feels like she still constipated. She denies any abdominal swelling, nausea, vomiting, or fever.  PCP Dr Eula Fried  Past Medical History  Diagnosis Date  . Lymphoma ([place])   . Systemic lupus erythematosus ([place])   . Depression   . Hyperlipidemia   . Sjogren's syndrome ([place])   . Orthostatic hypotension   . Dysphagia   . Syncope   . Anemia   . PMB (postmenopausal bleeding) 06/12/2015  . CKD (chronic kidney disease) stage 3, GFR 30-59 ml/min   . Kidney stones   . Heart murmur     "one dr told me I have one"  . Myocardial infarction ([place]) 2006  . CAD (coronary artery disease)   . COPD (chronic obstructive pulmonary disease) ([place]) dx'd 09/28/2015  . Pneumonia 09/28/2015  . History of blood transfusion 09/2015    "blood was low"  . Rheumatoid arthritis ([place])     "back" (09/28/2015)  . Chronic lower back pain    Past Surgical History  Procedure Laterality Date  . Mass excision Left ~ 2000    Lymphoma  . Cataract extraction w/ intraocular lens  implant, bilateral Bilateral   . Breast biopsy Left   . Tonsillectomy Left ~ 2000  .  Laparoscopic cholecystectomy    . Lymph node dissection Left ~ 2000    "neck"  . Eye surgery    . Retinal detachment surgery Right   . Lithotripsy  X 3  . Coronary angioplasty with stent placement  2006    DES to mid RCA   . Dilation and curettage of uterus    . Tubal ligation    . Cardiac catheterization N/A 10/06/2015    Procedure: Right/Left Heart Cath and Coronary Angiography;  Surgeon: Leonie Man, MD;  Location: [facility];  Service: Cardiovascular;  Laterality: N/A;   Family History  Problem Relation Age of Onset  . Coronary artery disease Father   . Coronary artery disease Mother   . Liver disease Mother   . Esophageal cancer Brother   . Breast cancer Sister    Social History  Substance Use Topics  . Smoking status: Former Smoker -- 0.25 packs/day for 23 years    Types: Cigarettes    Start date: 07/14/1986    Quit date: 09/18/2015  . Smokeless tobacco: Never Used  . Alcohol Use: No   Lives at home Lives with daughter  OB History    Gravida Para Term Preterm AB TAB SAB Ectopic Multiple Living   3 3        3  Review of Systems  All other systems reviewed and are negative.     Allergies  Florinef; Iohexol; Sulfonamide derivatives; and Tape  Home Medications   Prior to Admission medications   Medication Sig Start Date End Date Taking? Authorizing Provider  acetaminophen (TYLENOL) 325 MG tablet Take 2 tablets (650 mg total) by mouth every 6 (six) hours as needed for mild pain (or Fever >/= 101). 09/18/15   Robbie Lis, MD  albuterol (PROVENTIL) (2.5 MG/3ML) 0.083% nebulizer solution Take 3 mLs (2.5 mg total) by nebulization every 6 (six) hours as needed for wheezing or shortness of breath. 09/18/15   Robbie Lis, MD  ALPRAZolam Duanne Moron) 0.5 MG tablet Take 0.5 mg by mouth 3 (three) times daily.     Historical Provider, MD  amitriptyline (ELAVIL) 50 MG tablet Take one tablet each night for 6 more days; then take 1 tablet every other night until  further instructions by your primary care physician. 11/03/15   Rexene Alberts, MD  Artificial Tear Solution (SOOTHE XP OP) Apply 1 drop to eye 3 (three) times daily.     Historical Provider, MD  aspirin 81 MG tablet Take 81 mg by mouth daily.    Historical Provider, MD  atorvastatin (LIPITOR) 40 MG tablet Take 1 tablet (40 mg total) by mouth every morning. 11/03/15   Rexene Alberts, MD  Biotin 5000 MCG CAPS Take 5,000 mcg by mouth daily.    Historical Provider, MD  Cholecalciferol (VITAMIN D) 400 UNITS capsule Take 400 Units by mouth every evening.     Historical Provider, MD  clobetasol cream (TEMOVATE) [id number] % Apply 1 application topically 2 (two) times daily.    Historical Provider, MD  cycloSPORINE (RESTASIS) 0.05 % ophthalmic emulsion Place 1 drop into both eyes 2 (two) times daily.    Historical Provider, MD  docusate sodium (COLACE) 100 MG capsule Take 100 mg by mouth 2 (two) times daily as needed for mild constipation.    Historical Provider, MD  FERREX 150 150 MG capsule Take 150 mg by mouth daily. 10/13/15   Historical Provider, MD  folic acid (FOLVITE) 1 MG tablet Take 1 mg by mouth every morning.     Historical Provider, MD  furosemide (LASIX) 20 MG tablet Take 1 tablet by mouth daily as needed for fluid.  10/11/15   Historical Provider, MD  HYDROcodone-acetaminophen (NORCO/VICODIN) 5-325 MG per tablet Take 1 tablet by mouth 3 (three) times daily as needed for moderate pain.     Historical Provider, MD  hydroxychloroquine (PLAQUENIL) 200 MG tablet Take 200 mg by mouth daily. To be taken with food or milk 02/07/13   Historical Provider, MD  KLOR-CON 10 10 MEQ tablet Take 10 mEq by mouth 3 (three) times daily. 10/07/15   Historical Provider, MD  levothyroxine (SYNTHROID, LEVOTHROID) 25 MCG tablet Take 25 mcg by mouth every morning.     Historical Provider, MD  medroxyPROGESTERone (PROVERA) 10 MG tablet Take 1 tablet (10 mg total) by mouth daily. 06/21/15   Florian Buff, MD  midodrine (PROAMATINE) 10  MG tablet Take 2 tablets each morning and 1 tablet at bedtime, to help keep your blood pressure from dropping too much. 11/03/15   Rexene Alberts, MD  Multiple Vitamins-Minerals (PRESERVISION/LUTEIN PO) Take 1 capsule by mouth daily.    Historical Provider, MD  nitroGLYCERIN (NITROSTAT) 0.4 MG SL tablet Place 1 tablet (0.4 mg total) under the tongue every 5 (five) minutes as needed for chest pain. 11/03/15   Rexene Alberts,  MD  omeprazole (PRILOSEC) 20 MG capsule Take 20 mg by mouth daily.     Historical Provider, MD  predniSONE (DELTASONE) 10 MG tablet Take 1 tablet (10 mg total) by mouth daily with breakfast. 11/03/15   Rexene Alberts, MD  raloxifene (EVISTA) 60 MG tablet Take 60 mg by mouth every morning.     Historical Provider, MD  senna-docusate (SENOKOT-S) 8.6-50 MG tablet Take 1 tablet by mouth 2 (two) times daily. For constipation. 11/03/15   Rexene Alberts, MD  sodium bicarbonate 325 MG tablet Take 650 mg by mouth 3 (three) times daily.     Historical Provider, MD  Travoprost, BAK Free, (TRAVATAN) 0.004 % SOLN ophthalmic solution Place 1 drop into both eyes at bedtime.    Historical Provider, MD   BP 107/68 mmHg  Pulse 96  Temp(Src) 98.1 F (36.7 C)  Resp 20  Ht 5\' 10"  (1.778 m)  Wt 113 lb (51.256 kg)  BMI 16.21 kg/m2  SpO2 100%  Vital signs normal   Physical Exam  Constitutional: She is oriented to person, place, and time.  Non-toxic appearance. She does not appear ill. No distress.  Frail elderly female who is alert and cooperative  HENT:  Head: Normocephalic and atraumatic.  Right Ear: External ear normal.  Left Ear: External ear normal.  Nose: Nose normal. No mucosal edema or rhinorrhea.  Mouth/Throat: Oropharynx is clear and moist and mucous membranes are normal. No dental abscesses or uvula swelling.  Eyes: Conjunctivae and EOM are normal. Pupils are equal, round, and reactive to light.  Neck: Normal range of motion and full passive range of motion without pain. Neck supple.   Cardiovascular: Normal rate, regular rhythm and normal heart sounds.  Exam reveals no gallop and no friction rub.   No murmur heard. Pulmonary/Chest: Effort normal and breath sounds normal. No respiratory distress. She has no wheezes. She has no rhonchi. She has no rales. She exhibits no tenderness and no crepitus.  Abdominal: Soft. Normal appearance and bowel sounds are normal. She exhibits no distension. There is no tenderness. There is no rebound and no guarding.  Musculoskeletal: Normal range of motion. She exhibits no edema or tenderness.  Moves all extremities well.   Neurological: She is alert and oriented to person, place, and time. She has normal strength. No cranial nerve deficit.  Skin: Skin is warm, dry and intact. No rash noted. No erythema. No pallor.  Psychiatric: She has a normal mood and affect. Her speech is normal and behavior is normal. Her mood appears not anxious.  Nursing note and vitals reviewed.   ED Course  Procedures (including critical care time)  Medications  sodium phosphate (FLEET) 7-19 GM/118ML enema 1 enema (1 enema Rectal Given 11/22/15 0157)  bisacodyl (DULCOLAX) suppository 10 mg (10 mg Rectal Given 11/22/15 0325)    Patient was given a fleets enema without results. She then had a Dulcolax suppository inserted. Patient then had a large bowel movement and states she feels better. Asked her narcotic pain medication is the most likely etiology for her chronic constipation. She was advised to take MiraLAX to help prevent constipation.   Labs Review Labs Reviewed - No data to display  Imaging Review Dg Abd Acute W/chest  11/22/2015  CLINICAL DATA:  Constipation, rectal pain, and weakness tonight. EXAM: DG ABDOMEN ACUTE W/ 1V CHEST COMPARISON:  Chest 10/28/2015 FINDINGS: Emphysematous changes in the lungs with scattered fibrosis in the lung bases and apical pleural thickening and scarring. No focal airspace disease or  consolidation in the lungs. No blunting  of costophrenic angles. No pneumothorax. Normal heart size and pulmonary vascularity have Gas and stool throughout the colon. No small or large bowel distention. The no free intra-abdominal air. No abnormal air-fluid levels. Surgical clips in the right upper quadrant. Vascular calcifications. Calcifications in the right upper quadrant could represent gallstones. Degenerative changes in the spine and hips. IMPRESSION: Emphysematous changes in the lungs. No evidence of active pulmonary disease. Nonobstructive bowel gas pattern with stool-filled colon. Possible calcified gallstones. Electronically Signed   By: Lucienne Capers M.D.   On: 11/22/2015 00:31   I have personally reviewed and evaluated these images and lab results as part of my medical decision-making.    MDM   Final diagnoses:  Constipation, unspecified constipation type    meds OTC miralax  Plan discharge  Rolland Porter, MD, Barbette Or, MD 11/22/15 [phone]

## 2015-11-22 NOTE — Discharge Instructions (Signed)
Your pain medications will make you have constipation.  Get miralax and put one dose or 17 g in 8 ounces of water,  take 1 dose every 30 minutes for 2-3 hours or until you  get good results and then once or twice daily to prevent constipation. If you have hard stools you can take Dulcolax suppositories that you can by over-the-counter to put up in there to help soften the stool. You can also try the fleets enema that is also over-the-counter. Recheck if you get fever, vomiting, or severe abdominal pain.   Constipation, Adult Constipation is when a person has fewer than three bowel movements a week, has difficulty having a bowel movement, or has stools that are dry, hard, or larger than normal. As people grow older, constipation is more common. A low-fiber diet, not taking in enough fluids, and taking certain medicines may make constipation worse.  CAUSES   Certain medicines, such as antidepressants, pain medicine, iron supplements, antacids, and water pills.   Certain diseases, such as diabetes, irritable bowel syndrome (IBS), thyroid disease, or depression.   Not drinking enough water.   Not eating enough fiber-rich foods.   Stress or travel.   Lack of physical activity or exercise.   Ignoring the urge to have a bowel movement.   Using laxatives too much.  SIGNS AND SYMPTOMS   Having fewer than three bowel movements a week.   Straining to have a bowel movement.   Having stools that are hard, dry, or larger than normal.   Feeling full or bloated.   Pain in the lower abdomen.   Not feeling relief after having a bowel movement.  DIAGNOSIS  Your health care provider will take a medical history and perform a physical exam. Further testing may be done for severe constipation. Some tests may include:  A barium enema X-ray to examine your rectum, colon, and, sometimes, your small intestine.   A sigmoidoscopy to examine your lower colon.   A colonoscopy to examine  your entire colon. TREATMENT  Treatment will depend on the severity of your constipation and what is causing it. Some dietary treatments include drinking more fluids and eating more fiber-rich foods. Lifestyle treatments may include regular exercise. If these diet and lifestyle recommendations do not help, your health care provider may recommend taking over-the-counter laxative medicines to help you have bowel movements. Prescription medicines may be prescribed if over-the-counter medicines do not work.  HOME CARE INSTRUCTIONS   Eat foods that have a lot of fiber, such as fruits, vegetables, whole grains, and beans.  Limit foods high in fat and processed sugars, such as french fries, hamburgers, cookies, candies, and soda.   A fiber supplement may be added to your diet if you cannot get enough fiber from foods.   Drink enough fluids to keep your urine clear or pale yellow.   Exercise regularly or as directed by your health care provider.   Go to the restroom when you have the urge to go. Do not hold it.   Only take over-the-counter or prescription medicines as directed by your health care provider. Do not take other medicines for constipation without talking to your health care provider first.  [place] IF:   You have bright red blood in your stool.   Your constipation lasts for more than 4 days or gets worse.   You have abdominal or rectal pain.   You have thin, pencil-like stools.   You have unexplained weight loss. MAKE  SURE YOU:   Understand these instructions.  Will watch your condition.  Will get help right away if you are not doing well or get worse.   This information is not intended to replace advice given to you by your health care provider. Make sure you discuss any questions you have with your health care provider.   Document Released: 01/26/2004 Document Revised: 05/20/2014 Document Reviewed: 02/08/2013 Elsevier Interactive Patient  Education [employer].

## 2015-11-22 NOTE — ED Notes (Signed)
Pt to bathroom but had no luck having a bowel movement.

## 2015-11-23 ENCOUNTER — Encounter: Payer: Self-pay | Admitting: Adult Health

## 2015-11-23 ENCOUNTER — Ambulatory Visit (INDEPENDENT_AMBULATORY_CARE_PROVIDER_SITE_OTHER): Payer: Medicare Other | Admitting: Adult Health

## 2015-11-23 VITALS — BP 116/54 | HR 109 | Ht 70.0 in | Wt 116.6 lb

## 2015-11-23 DIAGNOSIS — I2510 Atherosclerotic heart disease of native coronary artery without angina pectoris: Secondary | ICD-10-CM | POA: Diagnosis not present

## 2015-11-23 DIAGNOSIS — I951 Orthostatic hypotension: Secondary | ICD-10-CM | POA: Diagnosis not present

## 2015-11-23 DIAGNOSIS — D509 Iron deficiency anemia, unspecified: Secondary | ICD-10-CM

## 2015-11-23 NOTE — Progress Notes (Signed)
Name: Tiffany [name]    DOB: September 07, 1940  Age: 75 y.o.  MR#: [id number]       PCP:  Cleda Mccreedy, MD      Insurance: Payor: [employer] MEDICARE / Plan: [facility] MEDICARE / Product Type: *No Product type* /   CC:   No chief complaint on file.   VS Filed Vitals:   11/23/15 1441  Height: 5\' 10"  (1.778 m)  Weight: 116 lb 9.6 oz (52.889 kg)    Weights Current Weight  11/23/15 116 lb 9.6 oz (52.889 kg)  11/21/15 113 lb (51.256 kg)  11/03/15 115 lb 15.4 oz (52.6 kg)    Blood Pressure  BP Readings from Last 3 Encounters:  11/22/15 107/68  11/03/15 107/56  10/06/15 127/68     Admit date:  (Not on file) Last encounter with RMR:  11/09/2015   Allergy Florinef; Iohexol; Sulfonamide derivatives; and Tape  Current Outpatient Prescriptions  Medication Sig Dispense Refill  . acetaminophen (TYLENOL) 325 MG tablet Take 2 tablets (650 mg total) by mouth every 6 (six) hours as needed for mild pain (or Fever >/= 101). 30 tablet 0  . albuterol (PROVENTIL) (2.5 MG/3ML) 0.083% nebulizer solution Take 3 mLs (2.5 mg total) by nebulization every 6 (six) hours as needed for wheezing or shortness of breath. 75 mL 12  . ALPRAZolam (XANAX) 0.5 MG tablet Take 0.5 mg by mouth 3 (three) times daily.     Marland Kitchen amitriptyline (ELAVIL) 50 MG tablet Take one tablet each night for 6 more days; then take 1 tablet every other night until further instructions by your primary care physician. 30 tablet 0  . Artificial Tear Solution (SOOTHE XP OP) Apply 1 drop to eye 3 (three) times daily.     Marland Kitchen aspirin 81 MG tablet Take 81 mg by mouth daily.    Marland Kitchen atorvastatin (LIPITOR) 40 MG tablet Take 1 tablet (40 mg total) by mouth every morning.    . Biotin 5000 MCG CAPS Take 5,000 mcg by mouth daily.    . Cholecalciferol (VITAMIN D) 400 UNITS capsule Take 400 Units by mouth every evening.     . clobetasol cream (TEMOVATE) [id number] % Apply 1 application topically 2 (two) times daily.    . cycloSPORINE (RESTASIS) 0.05 % ophthalmic emulsion  Place 1 drop into both eyes 2 (two) times daily.    Marland Kitchen docusate sodium (COLACE) 100 MG capsule Take 100 mg by mouth 2 (two) times daily as needed for mild constipation.    Marland Kitchen FERREX 150 150 MG capsule Take 150 mg by mouth daily.  5  . folic acid (FOLVITE) 1 MG tablet Take 1 mg by mouth every morning.     . furosemide (LASIX) 20 MG tablet Take 1 tablet by mouth daily as needed for fluid.   1  . HYDROcodone-acetaminophen (NORCO/VICODIN) 5-325 MG per tablet Take 1 tablet by mouth 3 (three) times daily as needed for moderate pain.     . hydroxychloroquine (PLAQUENIL) 200 MG tablet Take 200 mg by mouth daily. To be taken with food or milk    . KLOR-CON 10 10 MEQ tablet Take 10 mEq by mouth 3 (three) times daily.  3  . levothyroxine (SYNTHROID, LEVOTHROID) 25 MCG tablet Take 25 mcg by mouth every morning.     . medroxyPROGESTERone (PROVERA) 10 MG tablet Take 1 tablet (10 mg total) by mouth daily. 30 tablet 11  . midodrine (PROAMATINE) 10 MG tablet Take 2 tablets each morning and 1 tablet at bedtime, to  help keep your blood pressure from dropping too much. 90 tablet 1  . Multiple Vitamins-Minerals (PRESERVISION/LUTEIN PO) Take 1 capsule by mouth daily.    . nitroGLYCERIN (NITROSTAT) 0.4 MG SL tablet Place 1 tablet (0.4 mg total) under the tongue every 5 (five) minutes as needed for chest pain.    Marland Kitchen omeprazole (PRILOSEC) 20 MG capsule Take 20 mg by mouth daily.     . predniSONE (DELTASONE) 10 MG tablet Take 1 tablet (10 mg total) by mouth daily with breakfast. 5 tablet 0  . raloxifene (EVISTA) 60 MG tablet Take 60 mg by mouth every morning.     . senna-docusate (SENOKOT-S) 8.6-50 MG tablet Take 1 tablet by mouth 2 (two) times daily. For constipation.    . sodium bicarbonate 325 MG tablet Take 650 mg by mouth 3 (three) times daily.     . Travoprost, BAK Free, (TRAVATAN) 0.004 % SOLN ophthalmic solution Place 1 drop into both eyes at bedtime.     No current facility-administered medications for this visit.     Discontinued Meds:   There are no discontinued medications.  Patient Active Problem List   Diagnosis Date Noted  . Lupus (systemic lupus erythematosus) ([place]) 11/02/2015  . Syncope 11/02/2015  . Chronic systolic CHF (congestive heart failure) ([place]) 10/31/2015  . Dehydration 10/28/2015  . Near syncope 10/28/2015  . CKD (chronic kidney disease) stage 4, GFR 15-29 ml/min (HCC) 10/28/2015  . Anemia of chronic disease   . Cardiomyopathy, ischemic 10/05/2015  . Abnormal nuclear stress test 10/05/2015  . Acute bronchitis with COPD ([place]) 09/28/2015  . Recent NSTEMI (non-ST elevated myocardial infarction) ([place]) 09/15/2015  . Fever 09/15/2015  . Non-ST elevated myocardial infarction ([place]) 09/15/2015  . PMB (postmenopausal bleeding) 06/12/2015  . Atypical chest pain 07/13/2014  . DOE (dyspnea on exertion) 10/22/2013  . CHF, acute ([place]) 10/22/2013  . Systemic lupus erythematosus ([place])   . Coronary artery disease involving native heart with angina pectoris ([place])   . Palpitations 04/21/2013  . Chronic anticoagulation 01/29/2012  . Pulmonary embolism ([place]) 11/28/2011  . Chest pain 09/06/2011  . CKD (chronic kidney disease), stage IV ([place]) 05/24/2011  . Essential hypertension   . Hyperlipidemia   . Arteriosclerotic cardiovascular disease (ASCVD)   . Sjogren's syndrome ([place])   . Tobacco abuse 08/08/2010  . WEIGHT LOSS, ABNORMAL 07/05/2010  . Orthostatic hypotension 01/24/2010  . SYNCOPE 08/28/2009  . LYMPHOMA 10/31/2008    LABS    Component Value Date/Time   NA 135 11/02/2015 0528   NA 134* 11/01/2015 0537   NA 134* 10/31/2015 0554   K 3.9 11/02/2015 0528   K 4.2 11/01/2015 0537   K 4.0 10/31/2015 0554   CL 111 11/02/2015 0528   CL 111 11/01/2015 0537   CL 111 10/31/2015 0554   CO2 20* 11/02/2015 0528   CO2 19* 11/01/2015 0537   CO2 19* 10/31/2015 0554   GLUCOSE 94 11/02/2015 0528   GLUCOSE 77 11/01/2015 0537   GLUCOSE 83 10/31/2015 0554   BUN 27* 11/02/2015 0528   BUN  27* 11/01/2015 0537   BUN 29* 10/31/2015 0554   CREATININE 1.61* 11/02/2015 0528   CREATININE 1.78* 11/01/2015 0537   CREATININE 1.63* 10/31/2015 0554   CREATININE 1.73* 02/02/2014 0914   CREATININE 1.72* 06/01/2012 1413   CREATININE 1.87* 05/08/2012 1440   CALCIUM 8.4* 11/02/2015 0528   CALCIUM 8.3* 11/01/2015 0537   CALCIUM 8.4* 10/31/2015 0554   GFRNONAA 31* 11/02/2015 0528   GFRNONAA 27* 11/01/2015 [id number]  GFRNONAA 30* 10/31/2015 0554   GFRAA 36* 11/02/2015 0528   GFRAA 31* 11/01/2015 0537   GFRAA 35* 10/31/2015 0554   CMP     Component Value Date/Time   NA 135 11/02/2015 0528   K 3.9 11/02/2015 0528   CL 111 11/02/2015 0528   CO2 20* 11/02/2015 0528   GLUCOSE 94 11/02/2015 0528   BUN 27* 11/02/2015 0528   CREATININE 1.61* 11/02/2015 0528   CREATININE 1.73* 02/02/2014 0914   CALCIUM 8.4* 11/02/2015 0528   PROT 6.7 10/29/2015 0524   ALBUMIN 2.6* 10/29/2015 0524   AST 17 10/29/2015 0524   ALT 17 10/29/2015 0524   ALKPHOS 74 10/29/2015 0524   BILITOT 0.2* 10/29/2015 0524   GFRNONAA 31* 11/02/2015 0528   GFRAA 36* 11/02/2015 0528       Component Value Date/Time   WBC 3.8* 11/03/2015 0558   WBC 3.9* 11/02/2015 0528   WBC 4.7 11/01/2015 0537   HGB 8.8* 11/03/2015 0558   HGB 8.3* 11/02/2015 0528   HGB 8.5* 11/01/2015 0537   HCT 26.4* 11/03/2015 0558   HCT 25.4* 11/02/2015 0528   HCT 25.8* 11/01/2015 0537   HCT 20.4* 10/29/2015 0524   MCV 94.0 11/03/2015 0558   MCV 93.0 11/02/2015 0528   MCV 93.1 11/01/2015 0537    Lipid Panel     Component Value Date/Time   CHOL 152 05/15/2011 0000   TRIG 61 05/15/2011 0000   HDL 58 05/15/2011 0000   CHOLHDL 2.6 05/15/2011 0000   VLDL 12 05/15/2011 0000   LDLCALC 82 05/15/2011 0000    ABG    Component Value Date/Time   PHART 7.323* 10/06/2015 1610   PCO2ART 31.0* 10/06/2015 1610   PO2ART 76.0* 10/06/2015 1610   HCO3 16.1* 10/06/2015 1610   TCO2 17 10/06/2015 1610   ACIDBASEDEF 9.0* 10/06/2015 1610   O2SAT 94.0  10/06/2015 1610     Lab Results  Component Value Date   TSH 3.536 10/31/2015   BNP (last 3 results)  Recent Labs  09/16/15 1115  BNP 187.6*    ProBNP (last 3 results) No results for input(s): PROBNP in the last 8760 hours.  Cardiac Panel (last 3 results) No results for input(s): CKTOTAL, CKMB, TROPONINI, RELINDX in the last 72 hours.  Iron/TIBC/Ferritin/ %Sat    Component Value Date/Time   IRON 66 10/29/2015 0524   TIBC 193* 10/29/2015 0524   FERRITIN 795* 10/29/2015 0524   IRONPCTSAT 34* 10/29/2015 0524   IRONPCTSAT 14* 05/24/2011 1211     EKG Orders placed or performed during the hospital encounter of 10/28/15  . EKG     Prior Assessment and Plan Problem List as of 11/23/2015      Cardiovascular and Mediastinum   Essential hypertension   Last Assessment & Plan 11/02/2013 Office Visit Written 11/02/2013  1:29 PM by Lendon Colonel, NP    Blood pressure is currently very well-controlled. She has occasional dizziness when she stands or goes from a lying position to a standing position. She is taking her time when getting up. I reviewed her echo results revealing an EF of 45 percent. She is on Lasix every other day. I have asked her to keep track of her blood pressure, if her blood pressure decreases to less than [id number] systolic she is to hold her Lasix that day. I will repeat her echocardiogram in a few months to evaluate LV function improvement. We may be able to get her off the Lasix altogether. Followup BMET will be completed  in 3 months as well to evaluate for kidney function and sodium.      Coronary artery disease involving native heart with angina pectoris [facility])   Last Assessment & Plan 11/02/2013 Office Visit Written 11/02/2013  1:31 PM by Lendon Colonel, NP    She remained stable. She will remain on aspirin statin. She is not on a beta blocker in the setting of emphysema and bronchospasms associated. Blood pressure is currently well-controlled.       Cardiomyopathy, ischemic   Orthostatic hypotension   Last Assessment & Plan 04/21/2013 Office Visit Written 04/21/2013  1:28 PM by Lendon Colonel, NP    I have explained to her that she does not tolerate a lower more normal BP as this causes significant symptoms of dizziness and near syncope. She will continue on florinef 0.1 mg daily. She does not wish to take it at night. I have reviewed labs and found them to be WNL from ER.       SYNCOPE   Last Assessment & Plan 06/01/2012 Office Visit Written 06/01/2012  2:39 PM by Lendon Colonel, NP    No further complaints of syncope, dizziness or near syncope.      Arteriosclerotic cardiovascular disease (ASCVD)   Last Assessment & Plan 06/01/2012 Office Visit Written 06/01/2012  2:39 PM by Lendon Colonel, NP    Other than frequent palpitations, she is without complaint. No chest pain or significiant DOE.      Pulmonary embolism [facility])   Last Assessment & Plan 11/28/2011 Office Visit Written 11/28/2011  4:09 PM by Lendon Colonel, NP    She is currently on Xarelto but has not started this yet. Will begin today. I will check CBC and BMET for follow-up labs with history of anemia and CKD on this medication. Hemoccult cards will be provided.        CHF, acute ([place])   Recent NSTEMI (non-ST elevated myocardial infarction) ([place])   Non-ST elevated myocardial infarction [facility])   Near syncope   Chronic systolic CHF (congestive heart failure) ([place])   Syncope     Respiratory   Acute bronchitis with COPD ([place])     Digestive   Sjogren's syndrome ([place])     Genitourinary   CKD (chronic kidney disease), stage IV [facility])   Last Assessment & Plan 04/16/2012 Office Visit Written 04/16/2012  4:33 PM by Lendon Colonel, NP    Repeat labs in 2 weeks for ongoing assessment.      CKD (chronic kidney disease) stage 4, GFR 15-29 ml/min (HCC)     Other   Hyperlipidemia   Last Assessment & Plan 05/24/2011 Office Visit Written 05/24/2011 12:56 PM by Yehuda Savannah, MD    Lipid control was excellent when last assessed one month ago.  Current therapy will be continued.      LYMPHOMA   WEIGHT LOSS, ABNORMAL   Last Assessment & Plan 05/24/2011 Office Visit Written 05/24/2011  1:01 PM by Yehuda Savannah, MD    Weight has been stable since her last visit.      Tobacco abuse   Chest pain   Last Assessment & Plan 11/02/2013 Office Visit Written 11/02/2013  1:30 PM by Lendon Colonel, NP    Completely resolved. She has had no recurrence with or without exertion.      Chronic anticoagulation   Palpitations   Last Assessment & Plan 04/21/2013 Office Visit Written 04/21/2013  1:31 PM by Lendon Colonel, NP  Review of EKG from ER and from today's office visit shows that she is having PVC's. No couplets or bigeminy. They appear to be benign. Reassurance is given. Will see her in 6 months. She is advised to decrease caffeine as she drinks several Dr. Samson Frederic a day.      Systemic lupus erythematosus (HCC)   DOE (dyspnea on exertion)   Last Assessment & Plan 11/02/2013 Office Visit Written 11/02/2013  1:32 PM by Lendon Colonel, NP    This is chronic for her in the setting of emphysema. She is not very active. States it is not a lot during the summer and winter. She will continue bronchodilators as directed, and followup with primary care as needed.      Atypical chest pain   PMB (postmenopausal bleeding)   Fever   Abnormal nuclear stress test   Dehydration   Anemia of chronic disease   Lupus (systemic lupus erythematosus) (HCC)       Imaging: Ct Abdomen Pelvis Wo Contrast  10/29/2015  CLINICAL DATA:  [place] 3 days ago. RIGHT lower quadrant pain, assess for retroperitoneal hematoma. History of lymphoma, lupus, chronic kidney disease. EXAM: CT ABDOMEN AND PELVIS WITHOUT CONTRAST TECHNIQUE: Multidetector CT imaging of the abdomen and pelvis was performed following the standard protocol without IV contrast. COMPARISON:  CT abdomen and  pelvis September 08, 2015 FINDINGS: LUNG BASES: Similar bilateral lower lobe bronchiectasis. Heart size is normal. Mild coronary artery calcifications. No pericardial effusion. KIDNEYS/BLADDER: Kidneys are orthotopic, relatively cyst symmetric cortical atrophy and scarring. Bilateral nephrolithiasis, RIGHT greater than LEFT measuring up to 5 mm on the RIGHT. Interval passage of LEFT lower pole 6 mm nephrolithiasis. No hydronephrosis ; limited assessment for renal masses on this nonenhanced examination. The unopacified ureters are normal in course and caliber. Urinary bladder is well distended with multiple punctate layering calculi. SOLID ORGANS: The liver, spleen, pancreas and adrenal glands are unremarkable for this non-contrast examination. Status post cholecystectomy. GASTROINTESTINAL TRACT: Small hiatal hernia. The stomach, small and large bowel are normal in course and caliber without inflammatory changes, the sensitivity may be decreased by lack of enteric contrast. Moderate to large amount of retained large bowel stool. The appendix is not discretely identified, however there are no inflammatory changes in the right lower quadrant. PERITONEUM/RETROPERITONEUM: Aortoiliac vessels are normal in course and caliber, severe calcific atherosclerosis. No lymphadenopathy by CT size criteria. Internal reproductive organs are unremarkable. No intraperitoneal free fluid nor free air. SOFT TISSUES/ OSSEOUS STRUCTURES: Nonsuspicious. Osteopenia. Mild chronic T11 compression fracture. Stable degenerative change of the spine. Focal irregular sclerosis bilateral femoral heads. Moderate degenerative change of the hips. IMPRESSION: No acute intra-abdominal or pelvic process, no retroperitoneal hematoma. Bilateral nonobstructing nephrolithiasis and interval passage of LEFT lower pole nephrolithiasis. Moderate to large amount of retained large bowel stool without bowel obstruction. 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 Electronically Signed   By: Elon Alas M.D.   On: 10/29/2015 18:04   Dg Ribs Unilateral W/chest Right  10/28/2015  CLINICAL DATA:  Generalized right rib pain after fall 2 days prior. Difficulty speaking. EXAM: RIGHT RIBS AND CHEST - 3+ VIEW COMPARISON:  Chest radiograph 09/28/2015 FINDINGS: No evidence of acute right rib fracture. Cortical margins are intact. The lungs remain hyperinflated. No pleural effusion, pneumothorax or new focal airspace opacity. Bibasilar opacities, left greater than right, stable and likely scarring. IMPRESSION: No evidence of acute right rib fracture or acute intrathoracic process. Electronically Signed   By: Fonnie Birkenhead.D.  On: 10/28/2015 20:49   Ct Head Wo Contrast  10/28/2015  CLINICAL DATA:  Fall, patient hit forehead 3 days ago. Complains of frontal head pain and headache intermittently. Pt states pain to right side of ribs, believes she "cracked a rib" when she fell 3 days ago. Pain with deep inhalation. EXAM: CT HEAD WITHOUT CONTRAST TECHNIQUE: Contiguous axial images were obtained from the base of the skull through the vertex without intravenous contrast. COMPARISON:  04/13/2011 FINDINGS: The ventricles are normal in configuration there is mild ventricular enlargement reflecting age related volume loss. There are no parenchymal masses or mass effect. 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 There is no evidence of a recent infarct. There are no extra-axial masses or abnormal fluid collections. There is no intracranial hemorrhage. Tendons secretions are noted in the right sphenoid sinus. Remaining sinuses are clear. Clear mastoid air cells. No skull fracture. IMPRESSION: 1. No acute intracranial abnormalities. 2. 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 Electronically Signed   By: Lajean Manes M.D.   On: 10/28/2015 20:53   Dg Abd Acute W/chest  11/22/2015  CLINICAL DATA:   Constipation, rectal pain, and weakness tonight. EXAM: DG ABDOMEN ACUTE W/ 1V CHEST COMPARISON:  Chest 10/28/2015 FINDINGS: Emphysematous changes in the lungs with scattered fibrosis in the lung bases and apical pleural thickening and scarring. No focal airspace disease or consolidation in the lungs. No blunting of costophrenic angles. No pneumothorax. Normal heart size and pulmonary vascularity have Gas and stool throughout the colon. No small or large bowel distention. The no free intra-abdominal air. No abnormal air-fluid levels. Surgical clips in the right upper quadrant. Vascular calcifications. Calcifications in the right upper quadrant could represent gallstones. Degenerative changes in the spine and hips. IMPRESSION: Emphysematous changes in the lungs. No evidence of active pulmonary disease. Nonobstructive bowel gas pattern with stool-filled colon. Possible calcified gallstones. Electronically Signed   By: Lucienne Capers M.D.   On: 11/22/2015 00:31

## 2015-11-23 NOTE — Patient Instructions (Addendum)
Your physician wants you to follow-up in: 6 Months with Dr. McDowell.  You will receive a reminder letter in the mail two months in advance. If you don't receive a letter, please call our office to schedule the follow-up appointment.  Your physician recommends that you continue on your current medications as directed. Please refer to the Current Medication list given to you today.  If you need a refill on your cardiac medications before your next appointment, please call your pharmacy.  Thank you for choosing [facility]! '  

## 2015-11-23 NOTE — Progress Notes (Signed)
Cardiology Office Note   Date:  11/23/2015   ID:  Tiffany [name], DOB 06/26/1940, MRN [id number]  PCP:  Cleda Mccreedy, MD  Cardiologist:  [place]/ Jory Sims, NP   Chief Complaint  Patient presents with  . Hypotension  . Coronary Artery Disease      History of Present Illness: Tiffany [name] is a 75 y.o. female who presents for ongoing assessment and management of coronary artery disease, with stent to the right coronary artery. She is status post cardiac catheterization per Dr. Ellyn Hack on 10/06/2015 in the setting of abnormal stress test. The patient had mild focal disease in the right coronary artery with widely patent stent, otherwise minimal CAD noted. The patient appeared to be somewhat dehydrated with low filling pressures, normal cardiac output. She was continued on her current medication management.  She was hospitalized on 11/03/2015 in the setting of near syncope, orthostatic hypotension, and chronic anemia. Her orthostatic hypotension was possibly secondary to amitriptyline.she was transfused one unit of packed red blood cells during hospitalization. Was felt that her chronic anemia was likely from chronic kidney disease and rheumatological disorders. Carvedilol was discontinued due to chronically low blood pressure.    Past Medical History  Diagnosis Date  . Lymphoma ([place])   . Systemic lupus erythematosus ([place])   . Depression   . Hyperlipidemia   . Sjogren's syndrome ([place])   . Orthostatic hypotension   . Dysphagia   . Syncope   . Anemia   . PMB (postmenopausal bleeding) 06/12/2015  . CKD (chronic kidney disease) stage 3, GFR 30-59 ml/min   . Kidney stones   . Heart murmur     "one dr told me I have one"  . Myocardial infarction ([place]) 2006  . CAD (coronary artery disease)   . COPD (chronic obstructive pulmonary disease) ([place]) dx'd 09/28/2015  . Pneumonia 09/28/2015  . History of blood transfusion 09/2015    "blood was low"  . Rheumatoid arthritis ([place])    "back" (09/28/2015)  . Chronic lower back pain     Past Surgical History  Procedure Laterality Date  . Mass excision Left ~ 2000    Lymphoma  . Cataract extraction w/ intraocular lens  implant, bilateral Bilateral   . Breast biopsy Left   . Tonsillectomy Left ~ 2000  . Laparoscopic cholecystectomy    . Lymph node dissection Left ~ 2000    "neck"  . Eye surgery    . Retinal detachment surgery Right   . Lithotripsy  X 3  . Coronary angioplasty with stent placement  2006    DES to mid RCA   . Dilation and curettage of uterus    . Tubal ligation    . Cardiac catheterization N/A 10/06/2015    Procedure: Right/Left Heart Cath and Coronary Angiography;  Surgeon: Leonie Man, MD;  Location: [facility];  Service: Cardiovascular;  Laterality: N/A;     Current Outpatient Prescriptions  Medication Sig Dispense Refill  . acetaminophen (TYLENOL) 325 MG tablet Take 2 tablets (650 mg total) by mouth every 6 (six) hours as needed for mild pain (or Fever >/= 101). 30 tablet 0  . albuterol (PROVENTIL) (2.5 MG/3ML) 0.083% nebulizer solution Take 3 mLs (2.5 mg total) by nebulization every 6 (six) hours as needed for wheezing or shortness of breath. 75 mL 12  . ALPRAZolam (XANAX) 0.5 MG tablet Take 0.5 mg by mouth 3 (three) times daily.     Marland Kitchen amitriptyline (ELAVIL) 50 MG tablet Take one  tablet each night for 6 more days; then take 1 tablet every other night until further instructions by your primary care physician. (Patient taking differently: Take 25 mg by mouth. Take one tablet each night for 6 more days; then take 1 tablet every other night until further instructions by your primary care physician.) 30 tablet 0  . Artificial Tear Solution (SOOTHE XP OP) Apply 1 drop to eye 3 (three) times daily.     Marland Kitchen aspirin 81 MG tablet Take 81 mg by mouth daily.    Marland Kitchen atorvastatin (LIPITOR) 40 MG tablet Take 1 tablet (40 mg total) by mouth every morning.    . Biotin 5000 MCG CAPS Take 5,000 mcg by mouth  daily.    . Cholecalciferol (VITAMIN D) 400 UNITS capsule Take 400 Units by mouth every evening.     . clobetasol cream (TEMOVATE) [id number] % Apply 1 application topically 2 (two) times daily.    . cycloSPORINE (RESTASIS) 0.05 % ophthalmic emulsion Place 1 drop into both eyes 2 (two) times daily.    Marland Kitchen docusate sodium (COLACE) 100 MG capsule Take 100 mg by mouth 2 (two) times daily as needed for mild constipation.    Marland Kitchen FERREX 150 150 MG capsule Take 150 mg by mouth daily.  5  . folic acid (FOLVITE) 1 MG tablet Take 1 mg by mouth every morning.     Marland Kitchen HYDROcodone-acetaminophen (NORCO/VICODIN) 5-325 MG per tablet Take 1 tablet by mouth 3 (three) times daily as needed for moderate pain.     . hydroxychloroquine (PLAQUENIL) 200 MG tablet Take 200 mg by mouth daily. To be taken with food or milk    . KLOR-CON 10 10 MEQ tablet Take 10 mEq by mouth 3 (three) times daily.  3  . levothyroxine (SYNTHROID, LEVOTHROID) 25 MCG tablet Take 25 mcg by mouth every morning.     . medroxyPROGESTERone (PROVERA) 10 MG tablet Take 1 tablet (10 mg total) by mouth daily. 30 tablet 11  . midodrine (PROAMATINE) 10 MG tablet Take 2 tablets each morning and 1 tablet at bedtime, to help keep your blood pressure from dropping too much. 90 tablet 1  . Multiple Vitamins-Minerals (PRESERVISION/LUTEIN PO) Take 1 capsule by mouth daily.    . nitroGLYCERIN (NITROSTAT) 0.4 MG SL tablet Place 1 tablet (0.4 mg total) under the tongue every 5 (five) minutes as needed for chest pain.    Marland Kitchen omeprazole (PRILOSEC) 20 MG capsule Take 20 mg by mouth daily.     . predniSONE (DELTASONE) 10 MG tablet Take 1 tablet (10 mg total) by mouth daily with breakfast. 5 tablet 0  . raloxifene (EVISTA) 60 MG tablet Take 60 mg by mouth every morning.     . senna-docusate (SENOKOT-S) 8.6-50 MG tablet Take 1 tablet by mouth 2 (two) times daily. For constipation.    . sodium bicarbonate 325 MG tablet Take 650 mg by mouth 3 (three) times daily.     . Travoprost, BAK  Free, (TRAVATAN) 0.004 % SOLN ophthalmic solution Place 1 drop into both eyes at bedtime.    . furosemide (LASIX) 20 MG tablet Take 1 tablet by mouth daily as needed for fluid. Reported on 11/23/2015  1   No current facility-administered medications for this visit.    Allergies:   Florinef; Iohexol; Sulfonamide derivatives; and Tape    Social History:  The patient  reports that she quit smoking about 2 months ago. Her smoking use included Cigarettes. She started smoking about 29 years ago. She  has a 5.75 pack-year smoking history. She has never used smokeless tobacco. She reports that she does not drink alcohol or use illicit drugs.   Family History:  The patient's family history includes Breast cancer in her sister; Coronary artery disease in her father and mother; Esophageal cancer in her brother; Liver disease in her mother.    ROS: All other systems are reviewed and negative. Unless otherwise mentioned in H&P    PHYSICAL EXAM: VS:  BP 116/54 mmHg  Pulse 109  Ht 5\' 10"  (1.778 m)  Wt 116 lb 9.6 oz (52.889 kg)  BMI 16.73 kg/m2  SpO2 99% , BMI Body mass index is 16.73 kg/(m^2). GEN: Well nourished, well developed, in no acute distressThin pale. HEENT: normal Neck: no JVD, carotid bruits, or masses Cardiac: RRR; no murmurs, rubs, or gallops,no edema  Respiratory:  clear to auscultation bilaterally, normal work of breathing GI: soft, nontender, nondistended, + BS MS: no deformity or atrophy Skin: warm and dry, no rash Neuro:  Strength and sensation are intact Psych: euthymic mood, full affect  Recent Labs: 09/16/2015: B Natriuretic Peptide 187.6* 10/29/2015: ALT 17 10/31/2015: TSH 3.536 11/02/2015: BUN 27*; Creatinine, Ser 1.61*; Potassium 3.9; Sodium 135 11/03/2015: Hemoglobin 8.8*; Platelets 168    Lipid Panel    Component Value Date/Time   CHOL 152 05/15/2011 0000   TRIG 61 05/15/2011 0000   HDL 58 05/15/2011 0000   CHOLHDL 2.6 05/15/2011 0000   VLDL 12 05/15/2011 0000    LDLCALC 82 05/15/2011 0000      Wt Readings from Last 3 Encounters:  11/23/15 116 lb 9.6 oz (52.889 kg)  11/21/15 113 lb (51.256 kg)  11/03/15 115 lb 15.4 oz (52.6 kg)      ASSESSMENT AND PLAN:  1.  Orthostatic hypotension: This is been corrected with decreased dose of amitriptyline from 50 mg daily to 25 mg daily. Lasix has been discontinued.Florinef has been changed to Midodrine 10 mg (2 tabs in am and one tablet in pm.). She is feeling better. She continues to have episodes of mild dizziness but not as strong.  2. Coronary artery disease: denies chest pain or dyspnea on exertion. She has remained stable and were made on aspirin and statin. She is not on beta blocker in the setting of emphysema and bronchospasms.  3. Syncope:improved with medication adjustments.  4. Anemia:she received blood transfusions during recent hospitalization. She is due to follow up with GI with plan colonoscopy and EGD, and possibly with hematology.  Current medicines are reviewed at length with the patient today.    Labs/ tests ordered today include:  No orders of the defined types were placed in this encounter.     Disposition:   FU with  6 months  Signed, Jory Sims, NP  11/23/2015 3:10 PM    [facility] [address] [place], [place] [address] Phone: [phone]; Fax: [phone]

## 2015-12-19 ENCOUNTER — Other Ambulatory Visit: Payer: Medicare Other

## 2015-12-19 ENCOUNTER — Ambulatory Visit: Payer: Medicare Other | Admitting: Obstetrics & Gynecology

## 2015-12-26 ENCOUNTER — Encounter: Payer: Self-pay | Admitting: Obstetrics & Gynecology

## 2015-12-26 ENCOUNTER — Ambulatory Visit (INDEPENDENT_AMBULATORY_CARE_PROVIDER_SITE_OTHER): Payer: Medicare Other | Admitting: Obstetrics & Gynecology

## 2015-12-26 ENCOUNTER — Ambulatory Visit (INDEPENDENT_AMBULATORY_CARE_PROVIDER_SITE_OTHER): Payer: Medicare Other

## 2015-12-26 VITALS — BP 110/60 | HR 80 | Wt 114.0 lb

## 2015-12-26 DIAGNOSIS — N854 Malposition of uterus: Secondary | ICD-10-CM

## 2015-12-26 DIAGNOSIS — N950 Postmenopausal bleeding: Secondary | ICD-10-CM

## 2015-12-26 DIAGNOSIS — N858 Other specified noninflammatory disorders of uterus: Secondary | ICD-10-CM

## 2015-12-26 NOTE — Progress Notes (Addendum)
PELVIC US TA/TV: heterogenous anteverted uterus w/mult uterine calcifications,small amount of fluid w/in the endometrium,echogenic endometrial mass 1.3 x .7 x 1.3 cm with color flow,EEC 1.9 mm, normal lt ov,unable to see rt ov,no free fluid,no pain during ultrasound

## 2016-02-04 NOTE — Progress Notes (Signed)
Follow up appointment for results  Chief Complaint  Patient presents with  . Follow-up    ultrasound today    Blood pressure 110/60, pulse 80, weight 114 lb (51.7 kg).  Study Result   GYNECOLOGIC SONOGRAM   Tiffany [name] is a 75 y.o. G3P3 for a pelvic sonogram for postmenopausal bleeding.  Uterus                      3.8 x 3.5 x 2.8 cm, heterogenous anteverted uterus w/mult uterine calcifications  Endometrium          1.9 mm, symmetrical, small amount of fluid w/in the endometrium,echogenic endometrial mass 1.3 x .7 x 1.3 cm with color flow  Right ovary            Unable to see rt ov,rt adnexa wnl  Left ovary                1.3 x 1.3 x .8 cm, wnl  No free fluid,no pain during ultrasound.  Technician Comments:  PELVIC US TA/TV: heterogenous anteverted uterus w/mult uterine calcifications,small amount of fluid w/in the endometrium,echogenic endometrial mass 1.3 x .7 x 1.3 cm with color flow,EEC 1.9 mm, normal lt ov,unable to see rt ov,no free fluid,no pain during ultrasound    [employer] 12/26/2015 2:14 PM       Endometrial stripe less than 3 mm with minimal amount of vaginal bleeding which does not require endometrial biopsy based on the findings of the sonogram  MEDS ordered this encounter: No orders of the defined types were placed in this encounter.   Orders for this encounter: No orders of the defined types were placed in this encounter.   Impression: Post-menopausal bleeding thin endometrium .  Plan: Return if bleeding continues Based on the ultrasound findings and the patient's symptoms no endometrial biopsy indicated at this point but that could change of her bleeding continues  Follow Up: Return if symptoms worsen or fail to improve.       Face to face time:  10 minutes  Greater than 50% of the visit time was spent in counseling and coordination of care with the patient.  The summary and outline of the counseling and care  coordination is summarized in the note above.   All questions were answered.  Past Medical History:  Diagnosis Date  . Anemia   . CAD (coronary artery disease)   . Chronic lower back pain   . CKD (chronic kidney disease) stage 3, GFR 30-59 ml/min   . COPD (chronic obstructive pulmonary disease) ([place]) dx'd 09/28/2015  . Depression   . Dysphagia   . Heart murmur    "one dr told me I have one"  . History of blood transfusion 09/2015   "blood was low"  . Hyperlipidemia   . Kidney stones   . Lymphoma ([place])   . Myocardial infarction ([place]) 2006  . Orthostatic hypotension   . PMB (postmenopausal bleeding) 06/12/2015  . Pneumonia 09/28/2015  . Rheumatoid arthritis ([place])    "back" (09/28/2015)  . Sjogren's syndrome ([place])   . Syncope   . Systemic lupus erythematosus ([place])     Past Surgical History:  Procedure Laterality Date  . BREAST BIOPSY Left   . CARDIAC CATHETERIZATION N/A 10/06/2015   Procedure: Right/Left Heart Cath and Coronary Angiography;  Surgeon: Leonie Man, MD;  Location: [facility];  Service: Cardiovascular;  Laterality: N/A;  . CATARACT EXTRACTION W/ INTRAOCULAR LENS  IMPLANT,  BILATERAL Bilateral   . CORONARY ANGIOPLASTY WITH STENT PLACEMENT  2006   DES to mid RCA   . DILATION AND CURETTAGE OF UTERUS    . EYE SURGERY    . LAPAROSCOPIC CHOLECYSTECTOMY    . LITHOTRIPSY  X 3  . LYMPH NODE DISSECTION Left ~ 2000   "neck"  . MASS EXCISION Left ~ 2000   Lymphoma  . RETINAL DETACHMENT SURGERY Right   . TONSILLECTOMY Left ~ 2000  . TUBAL LIGATION      OB History    Gravida Para Term Preterm AB Living   3 3       3    SAB TAB Ectopic Multiple Live Births           3      Allergies  Allergen Reactions  . Florinef [Fludrocortisone Acetate]     Malaise: Discomfort   . Iohexol Other (See Comments)    Pt. States that Dr. Florene Glen her kidney Dr. Does not want her to receive iv dye due to increased risk of kidney failure.   . Sulfonamide Derivatives Hives   . Tape Rash    ADHESIVE    Social History   Social History  . Marital status: Divorced    Spouse name: N/A  . Number of children: N/A  . Years of education: N/A   Occupational History  . Disabled and retired    Social History Main Topics  . Smoking status: Former Smoker    Packs/day: 0.25    Years: 23.00    Types: Cigarettes    Start date: 07/14/1986    Quit date: 09/18/2015  . Smokeless tobacco: Never Used  . Alcohol use No  . Drug use: No  . Sexual activity: No   Other Topics Concern  . None   Social History Narrative  . None    Family History  Problem Relation Age of Onset  . Coronary artery disease Father   . Coronary artery disease Mother   . Liver disease Mother   . Esophageal cancer Brother   . Breast cancer Sister

## 2016-03-06 ENCOUNTER — Telehealth: Payer: Self-pay | Admitting: Hematology

## 2016-03-06 ENCOUNTER — Encounter: Payer: Self-pay | Admitting: Hematology

## 2016-03-06 NOTE — Telephone Encounter (Signed)
Pt returned the call and confirmed appt date that matched daughter's dates off.  Completed intake, mailed pt letter, faxed referring provider date/time

## 2016-03-14 ENCOUNTER — Emergency Department (HOSPITAL_COMMUNITY): Payer: Medicare Other

## 2016-03-14 ENCOUNTER — Emergency Department (HOSPITAL_COMMUNITY)
Admission: EM | Admit: 2016-03-14 | Discharge: 2016-03-14 | Disposition: A | Discharge status: Home / Self Care | Payer: Medicare Other | Attending: Emergency Medicine | Admitting: Emergency Medicine

## 2016-03-14 ENCOUNTER — Encounter (HOSPITAL_COMMUNITY): Payer: Self-pay

## 2016-03-14 DIAGNOSIS — S0990XA Unspecified injury of head, initial encounter: Secondary | ICD-10-CM | POA: Insufficient documentation

## 2016-03-14 DIAGNOSIS — J449 Chronic obstructive pulmonary disease, unspecified: Secondary | ICD-10-CM | POA: Insufficient documentation

## 2016-03-14 DIAGNOSIS — I2510 Atherosclerotic heart disease of native coronary artery without angina pectoris: Secondary | ICD-10-CM | POA: Diagnosis not present

## 2016-03-14 DIAGNOSIS — S8992XA Unspecified injury of left lower leg, initial encounter: Secondary | ICD-10-CM | POA: Diagnosis present

## 2016-03-14 DIAGNOSIS — S61212A Laceration without foreign body of right middle finger without damage to nail, initial encounter: Secondary | ICD-10-CM | POA: Diagnosis not present

## 2016-03-14 DIAGNOSIS — S82045A Nondisplaced comminuted fracture of left patella, initial encounter for closed fracture: Secondary | ICD-10-CM

## 2016-03-14 DIAGNOSIS — Z23 Encounter for immunization: Secondary | ICD-10-CM | POA: Insufficient documentation

## 2016-03-14 DIAGNOSIS — Z7982 Long term (current) use of aspirin: Secondary | ICD-10-CM | POA: Diagnosis not present

## 2016-03-14 DIAGNOSIS — Z79899 Other long term (current) drug therapy: Secondary | ICD-10-CM | POA: Insufficient documentation

## 2016-03-14 DIAGNOSIS — W0110XA Fall on same level from slipping, tripping and stumbling with subsequent striking against unspecified object, initial encounter: Secondary | ICD-10-CM | POA: Diagnosis not present

## 2016-03-14 DIAGNOSIS — Y999 Unspecified external cause status: Secondary | ICD-10-CM | POA: Insufficient documentation

## 2016-03-14 DIAGNOSIS — S01511A Laceration without foreign body of lip, initial encounter: Secondary | ICD-10-CM | POA: Diagnosis not present

## 2016-03-14 DIAGNOSIS — S0083XA Contusion of other part of head, initial encounter: Secondary | ICD-10-CM

## 2016-03-14 DIAGNOSIS — N184 Chronic kidney disease, stage 4 (severe): Secondary | ICD-10-CM | POA: Diagnosis not present

## 2016-03-14 DIAGNOSIS — Y92531 Health care provider office as the place of occurrence of the external cause: Secondary | ICD-10-CM | POA: Diagnosis not present

## 2016-03-14 DIAGNOSIS — F17210 Nicotine dependence, cigarettes, uncomplicated: Secondary | ICD-10-CM | POA: Insufficient documentation

## 2016-03-14 DIAGNOSIS — W19XXXA Unspecified fall, initial encounter: Secondary | ICD-10-CM

## 2016-03-14 DIAGNOSIS — I5022 Chronic systolic (congestive) heart failure: Secondary | ICD-10-CM | POA: Insufficient documentation

## 2016-03-14 DIAGNOSIS — I130 Hypertensive heart and chronic kidney disease with heart failure and stage 1 through stage 4 chronic kidney disease, or unspecified chronic kidney disease: Secondary | ICD-10-CM | POA: Insufficient documentation

## 2016-03-14 DIAGNOSIS — Y939 Activity, unspecified: Secondary | ICD-10-CM | POA: Insufficient documentation

## 2016-03-14 MED ORDER — OXYCODONE-ACETAMINOPHEN 5-325 MG PO TABS: 1.0000 | ORAL_TABLET | Freq: Four times a day (QID) | ORAL | 0 refills | Status: DC | PRN

## 2016-03-14 MED ORDER — OXYCODONE-ACETAMINOPHEN 5-325 MG PO TABS
1.0000 | ORAL_TABLET | Freq: Once | ORAL | Status: AC
  Administered 2016-03-14: 1 via ORAL
  Filled 2016-03-14: qty 1

## 2016-03-14 MED ORDER — BACITRACIN ZINC 500 UNIT/GM EX OINT
TOPICAL_OINTMENT | CUTANEOUS | Status: AC
  Filled 2016-03-14: qty 1.8

## 2016-03-14 MED ORDER — TETANUS-DIPHTH-ACELL PERTUSSIS 5-2.5-18.5 LF-MCG/0.5 IM SUSP
0.5000 mL | Freq: Once | INTRAMUSCULAR | Status: AC
Start: 2016-03-14 — End: 2016-03-14
  Administered 2016-03-14: 0.5 mL via INTRAMUSCULAR
  Filled 2016-03-14: qty 0.5

## 2016-03-14 NOTE — ED Provider Notes (Signed)
[facility] DEPT Provider Note   CSN: [id number] Arrival date & time: 03/14/16  1152  By signing my name below, I, Sonum Patel, attest that this documentation has been prepared under the direction and in the presence of Forde Dandy, MD. Electronically Signed: Sonum Patel, Education administrator. 03/14/16. 12:30 PM.  History   Chief Complaint Chief Complaint  Patient presents with  . Fall    HPI   HPI Comments: Tiffany [name] is a 75 y.o. female who presents to the Emergency Department complaining of a fall that occurred earlier today. Patient was leaving a doctor's appointment when she tripped on a curb and fell forward, landing on her face. She believes she may have had LOC. She states she was unable to get up by herself and once she was helped up she couldn't walk on her left leg due to pain. She complains of constant facial pain with associated laceration to her lower lip. She also complains of right middle finger laceration and left knee pain.  She denies anti-coagulants but takes a baby ASA. She denies nausea, vomiting, neck pain.   Past Medical History:  Diagnosis Date  . Anemia   . Anemia   . CAD (coronary artery disease)   . Chronic lower back pain   . CKD (chronic kidney disease) stage 3, GFR 30-59 ml/min   . COPD (chronic obstructive pulmonary disease) ([place]) dx'd 09/28/2015  . Depression   . Dysphagia   . Heart murmur    "one dr told me I have one"  . History of blood transfusion 09/2015   "blood was low"  . Hyperlipidemia   . Kidney stones   . Lymphoma ([place])   . Myocardial infarction 2006  . Orthostatic hypotension   . PMB (postmenopausal bleeding) 06/12/2015  . Pneumonia 09/28/2015  . Rheumatoid arthritis ([place])    "back" (09/28/2015)  . Sjogren's syndrome ([place])   . Syncope   . Systemic lupus erythematosus ([place])     Patient Active Problem List   Diagnosis Date Noted  . Lupus (systemic lupus erythematosus) ([place]) 11/02/2015  . Syncope 11/02/2015  . Chronic systolic CHF  (congestive heart failure) ([place]) 10/31/2015  . Dehydration 10/28/2015  . Near syncope 10/28/2015  . CKD (chronic kidney disease) stage 4, GFR 15-29 ml/min (H[facility]) 10/28/2015  . Anemia of chronic disease   . Cardiomyopathy, ischemic 10/05/2015  . Abnormal nuclear stress test 10/05/2015  . Acute bronchitis with COPD ([place]) 09/28/2015  . Recent NSTEMI (non-ST elevated myocardial infarction) ([place]) 09/15/2015  . Fever 09/15/2015  . Non-ST elevated myocardial infarction ([place]) 09/15/2015  . PMB (postmenopausal bleeding) 06/12/2015  . Atypical chest pain 07/13/2014  . DOE (dyspnea on exertion) 10/22/2013  . CHF, acute ([place]) 10/22/2013  . Systemic lupus erythematosus ([place])   . Coronary artery disease involving native heart with angina pectoris ([place])   . Palpitations 04/21/2013  . Chronic anticoagulation 01/29/2012  . Pulmonary embolism ([place]) 11/28/2011  . Chest pain 09/06/2011  . CKD (chronic kidney disease), stage IV ([place]) 05/24/2011  . Essential hypertension   . Hyperlipidemia   . Arteriosclerotic cardiovascular disease (ASCVD)   . Sjogren's syndrome ([place])   . Tobacco abuse 08/08/2010  . WEIGHT LOSS, ABNORMAL 07/05/2010  . Orthostatic hypotension 01/24/2010  . SYNCOPE 08/28/2009  . LYMPHOMA 10/31/2008    Past Surgical History:  Procedure Laterality Date  . BREAST BIOPSY Left   . CARDIAC CATHETERIZATION N/A 10/06/2015   Procedure: Right/Left Heart Cath and Coronary Angiography;  Surgeon: Leonie Man, MD;  Location: [facility];  Service: Cardiovascular;  Laterality: N/A;  . CATARACT EXTRACTION W/ INTRAOCULAR LENS  IMPLANT, BILATERAL Bilateral   . CORONARY ANGIOPLASTY WITH STENT PLACEMENT  2006   DES to mid RCA   . DILATION AND CURETTAGE OF UTERUS    . EYE SURGERY    . LAPAROSCOPIC CHOLECYSTECTOMY    . LITHOTRIPSY  X 3  . LYMPH NODE DISSECTION Left ~ 2000   "neck"  . MASS EXCISION Left ~ 2000   Lymphoma  . RETINAL DETACHMENT SURGERY Right   . TONSILLECTOMY Left ~ 2000    . TUBAL LIGATION      OB History    Gravida Para Term Preterm AB Living   3 3       3    SAB TAB Ectopic Multiple Live Births           3       Home Medications    Prior to Admission medications   Medication Sig Start Date End Date Taking? Authorizing Provider  acetaminophen (TYLENOL) 325 MG tablet Take 2 tablets (650 mg total) by mouth every 6 (six) hours as needed for mild pain (or Fever >/= 101). 09/18/15  Yes Robbie Lis, MD  albuterol (PROVENTIL) (2.5 MG/3ML) 0.083% nebulizer solution Take 3 mLs (2.5 mg total) by nebulization every 6 (six) hours as needed for wheezing or shortness of breath. 09/18/15  Yes Robbie Lis, MD  ALPRAZolam Duanne Moron) 0.5 MG tablet Take 0.5 mg by mouth 3 (three) times daily.    Yes Historical Provider, MD  amitriptyline (ELAVIL) 50 MG tablet Take one tablet each night for 6 more days; then take 1 tablet every other night until further instructions by your primary care physician. Patient taking differently: Take 25 mg by mouth. Take one tablet each night for 6 more days; then take 1 tablet every other night until further instructions by your primary care physician. 11/03/15  Yes Rexene Alberts, MD  Artificial Tear Solution (SOOTHE XP OP) Apply 1 drop to eye 3 (three) times daily.    Yes Historical Provider, MD  aspirin 81 MG tablet Take 81 mg by mouth daily.   Yes Historical Provider, MD  atorvastatin (LIPITOR) 40 MG tablet Take 1 tablet (40 mg total) by mouth every morning. 11/03/15  Yes Rexene Alberts, MD  Cholecalciferol (VITAMIN D) 400 UNITS capsule Take 400 Units by mouth every evening.    Yes Historical Provider, MD  clobetasol cream (TEMOVATE) [id number] % Apply 1 application topically 2 (two) times daily.   Yes Historical Provider, MD  cycloSPORINE (RESTASIS) 0.05 % ophthalmic emulsion Place 1 drop into both eyes 2 (two) times daily.   Yes Historical Provider, MD  docusate sodium (COLACE) 100 MG capsule Take 100 mg by mouth 2 (two) times daily as needed for mild  constipation.   Yes Historical Provider, MD  FERREX 150 150 MG capsule Take 150 mg by mouth daily. 10/13/15  Yes Historical Provider, MD  folic acid (FOLVITE) 1 MG tablet Take 1 mg by mouth every morning.    Yes Historical Provider, MD  HYDROcodone-acetaminophen (NORCO/VICODIN) 5-325 MG per tablet Take 1 tablet by mouth 3 (three) times daily as needed for moderate pain.    Yes Historical Provider, MD  hydroxychloroquine (PLAQUENIL) 200 MG tablet Take 200 mg by mouth daily. To be taken with food or milk 02/07/13  Yes Historical Provider, MD  levothyroxine (SYNTHROID, LEVOTHROID) 25 MCG tablet Take 25 mcg by mouth every morning.  Yes Historical Provider, MD  midodrine (PROAMATINE) 10 MG tablet Take 2 tablets each morning and 1 tablet at bedtime, to help keep your blood pressure from dropping too much. 11/03/15  Yes Rexene Alberts, MD  Multiple Vitamins-Minerals (PRESERVISION/LUTEIN PO) Take 1 capsule by mouth daily.   Yes Historical Provider, MD  nitroGLYCERIN (NITROSTAT) 0.4 MG SL tablet Place 1 tablet (0.4 mg total) under the tongue every 5 (five) minutes as needed for chest pain. 11/03/15  Yes Rexene Alberts, MD  omeprazole (PRILOSEC) 20 MG capsule Take 20 mg by mouth daily.    Yes Historical Provider, MD  raloxifene (EVISTA) 60 MG tablet Take 60 mg by mouth every morning.    Yes Historical Provider, MD  senna-docusate (SENOKOT-S) 8.6-50 MG tablet Take 1 tablet by mouth 2 (two) times daily. For constipation. 11/03/15  Yes Rexene Alberts, MD  sodium bicarbonate 325 MG tablet Take 650 mg by mouth 3 (three) times daily.    Yes Historical Provider, MD  Travoprost, BAK Free, (TRAVATAN) 0.004 % SOLN ophthalmic solution Place 1 drop into both eyes at bedtime.   Yes Historical Provider, MD  medroxyPROGESTERone (PROVERA) 10 MG tablet Take 1 tablet (10 mg total) by mouth daily. Patient not taking: Reported on 03/14/2016 06/21/15   Florian Buff, MD  oxyCODONE-acetaminophen (PERCOCET/ROXICET) 5-325 MG tablet Take 1-2  tablets by mouth every 6 (six) hours as needed for severe pain. 03/14/16   Forde Dandy, MD    Family History Family History  Problem Relation Age of Onset  . Coronary artery disease Father   . Coronary artery disease Mother   . Liver disease Mother   . Esophageal cancer Brother   . Breast cancer Sister     Social History Social History  Substance Use Topics  . Smoking status: Current Every Day Smoker    Packs/day: 0.25    Years: 23.00    Types: Cigarettes    Start date: 07/14/1986    Last attempt to quit: 09/18/2015  . Smokeless tobacco: Never Used  . Alcohol use No     Allergies   Florinef [fludrocortisone acetate]; Iohexol; Sulfonamide derivatives; and Tape   Review of Systems Review of Systems 10 Systems reviewed and all are negative for acute change except as noted in the HPI.    Physical Exam Updated Vital Signs BP 157/74 (BP Location: Left Arm)   Pulse 86   Temp 98.4 F (36.9 C)   Resp 18   Ht 5\' 10"  (1.778 m)   Wt 123 lb (55.8 kg)   SpO2 99%   BMI 17.65 kg/m   Physical Exam  Physical Exam  Nursing note and vitals reviewed. Constitutional: Well developed, well nourished, non-toxic, and in no acute distress Head: Normocephalic. Superficial abrasion over nose. Superficial laceration to lower lip   Mouth/Throat: Oropharynx is clear and moist.  Neck: Normal range of motion. Neck supple.  Cardiovascular: Normal rate and regular rhythm.   Pulmonary/Chest: Effort normal and breath sounds normal. No chest wall tenderness  Abdominal: Soft. There is no tenderness. There is no rebound and no guarding.  Musculoskeletal: Normal range of motion of upper extremities and RLE. Soft tissue swelling and superficial abrasion over left knee. ROM limited in left knee due to pain. No CTLS spine tenderness.  Neurological: Alert, no facial droop, fluent speech, moves all extremities symmetrically. PERRL, EOMI. Sensation to light touch intact throughout.  Skin: Skin is warm and  dry. Skin avulsion over tip of right 3rd digit. Psychiatric: Cooperative  ED Treatments / Results  DIAGNOSTIC STUDIES: Oxygen Saturation is 99% on RA, normal  by my interpretation.    COORDINATION OF CARE: 12:32 PM Discussed treatment plan with pt at bedside and pt agreed to plan.    Labs (all labs ordered are listed, but only abnormal results are displayed) Labs Reviewed - No data to display  EKG  EKG Interpretation None       Radiology Ct Head Wo Contrast  Result Date: 03/14/2016 CLINICAL DATA:  [place] over curb, hit head, forehead bruising EXAM: CT HEAD WITHOUT CONTRAST CT CERVICAL SPINE WITHOUT CONTRAST TECHNIQUE: Multidetector CT imaging of the head and cervical spine was performed following the standard protocol without intravenous contrast. Multiplanar CT image reconstructions of the cervical spine were also generated. COMPARISON:  10/28/2015 FINDINGS: CT HEAD FINDINGS Brain: No intracranial hemorrhage, mass effect or midline shift. No acute cortical infarction. No mass lesion is noted on this unenhanced scan. Stable cerebral atrophy. Stable mild cerebral atrophy. Ventricular size is stable from prior exam. Vascular: Mild atherosclerotic calcifications of carotid siphon. Skull: No skull fracture is noted. Sinuses/Orbits: Paranasal sinuses and mastoid air cells are unremarkable. Other: Minimal scalp swelling in left frontal region please see axial image 12. CT CERVICAL SPINE FINDINGS Alignment: There is normal alignment. Skull base and vertebrae: No acute fracture or subluxation. Degenerative changes are noted C1-C2 articulation. There is diffuse osteopenia. Anterior spurring noted lower endplate of C3 and C4 vertebral body. Mild anterior spurring lower endplate of C5 vertebral body. Soft tissues and spinal canal: No prevertebral soft tissue swelling. Spinal canal is patent. Disc levels:  Disc spaces are preserved. Upper chest: The visualized lung apices shows bilateral apical pleural  parenchymal scarring left greater than right. Some bronchiectasis is noted in left upper lobe. Pleural calcifications are noted bilateral apical. Other: None IMPRESSION: 1. No acute intracranial abnormality. Mild cerebral atrophy. No definite acute cortical infarction. 2. Mild scalp swelling in left frontal region. 3. No cervical spine acute fracture or subluxation. Diffuse osteopenia. Mild degenerative changes as described above. 4. Bilateral lung apices pleural parenchymal scarring left greater than 1. Bilateral apical pleural calcifications are noted. There is bronchiectasis in left apex. Electronically Signed   By: Lahoma Crocker M.D.   On: 03/14/2016 13:45   Ct Cervical Spine Wo Contrast  Result Date: 03/14/2016 CLINICAL DATA:  [place] over curb, hit head, forehead bruising EXAM: CT HEAD WITHOUT CONTRAST CT CERVICAL SPINE WITHOUT CONTRAST TECHNIQUE: Multidetector CT imaging of the head and cervical spine was performed following the standard protocol without intravenous contrast. Multiplanar CT image reconstructions of the cervical spine were also generated. COMPARISON:  10/28/2015 FINDINGS: CT HEAD FINDINGS Brain: No intracranial hemorrhage, mass effect or midline shift. No acute cortical infarction. No mass lesion is noted on this unenhanced scan. Stable cerebral atrophy. Stable mild cerebral atrophy. Ventricular size is stable from prior exam. Vascular: Mild atherosclerotic calcifications of carotid siphon. Skull: No skull fracture is noted. Sinuses/Orbits: Paranasal sinuses and mastoid air cells are unremarkable. Other: Minimal scalp swelling in left frontal region please see axial image 12. CT CERVICAL SPINE FINDINGS Alignment: There is normal alignment. Skull base and vertebrae: No acute fracture or subluxation. Degenerative changes are noted C1-C2 articulation. There is diffuse osteopenia. Anterior spurring noted lower endplate of C3 and C4 vertebral body. Mild anterior spurring lower endplate of C5  vertebral body. Soft tissues and spinal canal: No prevertebral soft tissue swelling. Spinal canal is patent. Disc levels:  Disc spaces are preserved. Upper chest: The visualized lung  apices shows bilateral apical pleural parenchymal scarring left greater than right. Some bronchiectasis is noted in left upper lobe. Pleural calcifications are noted bilateral apical. Other: None IMPRESSION: 1. No acute intracranial abnormality. Mild cerebral atrophy. No definite acute cortical infarction. 2. Mild scalp swelling in left frontal region. 3. No cervical spine acute fracture or subluxation. Diffuse osteopenia. Mild degenerative changes as described above. 4. Bilateral lung apices pleural parenchymal scarring left greater than 1. Bilateral apical pleural calcifications are noted. There is bronchiectasis in left apex. Electronically Signed   By: Lahoma Crocker M.D.   On: 03/14/2016 13:45   Dg Knee Complete 4 Views Left  Result Date: 03/14/2016 CLINICAL DATA:  LEFT knee pain and bruising after fall outside of doctor's office today. EXAM: LEFT KNEE - COMPLETE 4+ VIEW COMPARISON:  None. FINDINGS: Comminuted nondisplaced patellar fracture without distraction. No dislocation. No destructive bony lesions, osteopenia. Small suprapatellar joint effusion. Moderate vascular calcifications. Medial knee soft tissue swelling without subcutaneous gas or radiopaque foreign bodies. IMPRESSION: Acute nondisplaced patellar fracture.  No dislocation. Electronically Signed   By: Elon Alas M.D.   On: 03/14/2016 13:33    Procedures Procedures (including critical care time)  Medications Ordered in ED Medications  bacitracin 500 UNIT/GM ointment (not administered)  oxyCODONE-acetaminophen (PERCOCET/ROXICET) 5-325 MG per tablet 1 tablet (1 tablet Oral Given 03/14/16 1253)  Tdap (BOOSTRIX) injection 0.5 mL (0.5 mLs Intramuscular Given 03/14/16 1254)     Initial Impression / Assessment and Plan / ED Course  I have reviewed the  triage vital signs and the nursing notes.  Pertinent labs & imaging results that were available during my care of the patient were reviewed by me and considered in my medical decision making (see chart for details).  Clinical Course    Presents after mechanical fall.She is mentating normally, with normal neurological exam. Vital signs non-concerning. CT head and cervical spine visualized and without traumatic injuries of the head or neck. X-ray of the left knee reveals patellar fracture. She has intact extensor mechanism. We will place any immobilizer and have her follow-up with Dr. Aline Brochure from orthopedic surgery who she is seen in the past. No other injuries are noted on exam. With some superficial abrasions over the face and right hand. I discussed supportive care management for home. Able to ambulate. Strict return and follow-up instructions reviewed. She expressed understanding of all discharge instructions and felt comfortable with the plan of care.   Final Clinical Impressions(s) / ED Diagnoses   Final diagnoses:  Fall, initial encounter  Contusion of face, initial encounter  Closed nondisplaced comminuted fracture of left patella, initial encounter    New Prescriptions New Prescriptions   OXYCODONE-ACETAMINOPHEN (PERCOCET/ROXICET) 5-325 MG TABLET    Take 1-2 tablets by mouth every 6 (six) hours as needed for severe pain.   I personally performed the services described in this documentation, which was scribed in my presence. The recorded information has been reviewed and is accurate.     Forde Dandy, MD 03/14/16 [phone]

## 2016-03-14 NOTE — ED Triage Notes (Signed)
Pt fell over a curb at the doctor's office. Injuries to face, hands, L knee, wrist and fingers. Pt reports LOC. Pt alert and oriented at present.

## 2016-03-14 NOTE — ED Notes (Signed)
Patient transported to X-ray 

## 2016-03-14 NOTE — ED Notes (Signed)
Pt has stood up in room and has taken steps within room without difficultly

## 2016-03-14 NOTE — Discharge Instructions (Signed)
Please return without fail for worsening symptoms, including recurrent falls, inability to walk, or any other symptoms concerning to you.  Follow-up with Dr. Aline Brochure from orthopedic surgery regarding your left patellar fracture. In the meantime, wear knee immobilizer for support. You can bear weight as tolerated on the left leg.

## 2016-03-15 ENCOUNTER — Telehealth: Payer: Self-pay | Admitting: Orthopedic Surgery

## 2016-03-15 NOTE — Telephone Encounter (Signed)
Call received from patient's granddaughter and designated party cotnact, Caryl Pina, approximately 8:20a.m this morning -- following patient visit to [facility] last night, for problem seen for is patellar fracture, left knee.  Michela Pitcher would like to schedule appointment as soon as possible.  Relayed that both our providers are out of office today, and also out Monday, 03/18/16; therefore, gave her the contact phone number for the orthopaedist on call, Dr. Dot Lanes of [facility], [phone],  (verified per our practice administrator).  States will call this office to schedule.

## 2016-04-02 ENCOUNTER — Telehealth: Payer: Self-pay | Admitting: Hematology

## 2016-04-02 ENCOUNTER — Encounter: Payer: Self-pay | Admitting: Hematology

## 2016-04-02 ENCOUNTER — Ambulatory Visit (HOSPITAL_BASED_OUTPATIENT_CLINIC_OR_DEPARTMENT_OTHER): Payer: Medicare Other

## 2016-04-02 ENCOUNTER — Ambulatory Visit (HOSPITAL_BASED_OUTPATIENT_CLINIC_OR_DEPARTMENT_OTHER): Payer: Medicare Other | Admitting: Hematology

## 2016-04-02 VITALS — BP 128/72 | HR 73 | Temp 98.2°F | Resp 18

## 2016-04-02 DIAGNOSIS — D649 Anemia, unspecified: Secondary | ICD-10-CM

## 2016-04-02 DIAGNOSIS — D892 Hypergammaglobulinemia, unspecified: Secondary | ICD-10-CM

## 2016-04-02 LAB — CBC & DIFF AND RETIC
BASO%: 0.5 % (ref 0.0–2.0)
BASOS ABS: 0 10*3/uL (ref 0.0–0.1)
EOS ABS: 0.2 10*3/uL (ref 0.0–0.5)
EOS%: 3.2 % (ref 0.0–7.0)
HEMATOCRIT: 32.3 % — AB (ref 34.8–46.6)
HGB: 10.4 g/dL — ABNORMAL LOW (ref 11.6–15.9)
IMMATURE RETIC FRACT: 4.5 % (ref 1.60–10.00)
LYMPH%: 29.9 % (ref 14.0–49.7)
MCH: 30.2 pg (ref 25.1–34.0)
MCHC: 32.2 g/dL (ref 31.5–36.0)
MCV: 93.9 fL (ref 79.5–101.0)
MONO#: 0.5 10*3/uL (ref 0.1–0.9)
MONO%: 9.5 % (ref 0.0–14.0)
NEUT#: 3.2 10*3/uL (ref 1.5–6.5)
NEUT%: 56.9 % (ref 38.4–76.8)
PLATELETS: 190 10*3/uL (ref 145–400)
RBC: 3.44 10*6/uL — ABNORMAL LOW (ref 3.70–5.45)
RDW: 13.2 % (ref 11.2–14.5)
RETIC CT ABS: 39.22 10*3/uL (ref 33.70–90.70)
Retic %: 1.14 % (ref 0.70–2.10)
WBC: 5.6 10*3/uL (ref 3.9–10.3)
lymph#: 1.7 10*3/uL (ref 0.9–3.3)
nRBC: 0 % (ref 0–0)

## 2016-04-02 LAB — IRON AND TIBC
%SAT: 16 % — AB (ref 21–57)
IRON: 42 ug/dL (ref 41–142)
TIBC: 261 ug/dL (ref 236–444)
UIBC: 219 ug/dL (ref 120–384)

## 2016-04-02 LAB — COMPREHENSIVE METABOLIC PANEL
ALT: 18 U/L (ref 0–55)
ANION GAP: 10 meq/L (ref 3–11)
AST: 22 U/L (ref 5–34)
Albumin: 3.4 g/dL — ABNORMAL LOW (ref 3.5–5.0)
Alkaline Phosphatase: 69 U/L (ref 40–150)
BILIRUBIN TOTAL: 0.28 mg/dL (ref 0.20–1.20)
BUN: 35.2 mg/dL — AB (ref 7.0–26.0)
CALCIUM: 10 mg/dL (ref 8.4–10.4)
CO2: 19 meq/L — AB (ref 22–29)
CREATININE: 1.7 mg/dL — AB (ref 0.6–1.1)
Chloride: 110 mEq/L — ABNORMAL HIGH (ref 98–109)
EGFR: 30 mL/min/{1.73_m2} — ABNORMAL LOW (ref >90)
Glucose: 86 mg/dl (ref 70–140)
Potassium: 4.2 mEq/L (ref 3.5–5.1)
Sodium: 139 mEq/L (ref 136–145)
TOTAL PROTEIN: 8.5 g/dL — AB (ref 6.4–8.3)

## 2016-04-02 LAB — LACTATE DEHYDROGENASE: LDH: 233 U/L (ref 125–245)

## 2016-04-02 LAB — CHCC SMEAR

## 2016-04-02 LAB — FERRITIN: FERRITIN: 241 ng/mL (ref 9–269)

## 2016-04-02 NOTE — Telephone Encounter (Signed)
Appointments scheduled per 11/21 LOS. Patient given AVS report and calendars with future scheduled appointments. °

## 2016-04-02 NOTE — Progress Notes (Signed)
Marland Kitchen    HEMATOLOGY/ONCOLOGY CONSULTATION NOTE  Date of Service: 04/02/2016  Patient Care Team: Cleda Mccreedy, MD as PCP - General (Internal Medicine) Unice Bailey, MD (Internal Medicine) Estanislado Emms, MD (Nephrology) Allyn Kenner, MD (Dermatology) Yehuda Savannah, MD (Cardiology) Tillie Rung (rheumatology)  CHIEF COMPLAINTS/PURPOSE OF CONSULTATION:  Anemia Hypergammaglobulinemia  HISTORY OF PRESENTING ILLNESS:   Tiffany [name] is a wonderful 75 y.o. female who has been referred to [place] by Dr Eula Fried for evaluation and management of anemia and hypergammaglobulinemia.  Patient has multiple medical comorbidities including chronic kidney disease, COPD, previous MI with history of coronary artery disease, Sjogren's syndrome (sicca symptoms, moderate was previously on steroids and plaquenil, follows with Dr Dossie Der), osteoporosis (o [place]).  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 UPEP showed a some of M protein (TP  37.8mg /dL with 28.2% M spike.). She has CKD with creatinine in mid-high 1 range.  She was noted to have NCNC Anemia with hgb of 8.8. No overt bleeding.  She was referred to [place] for evaluation of her anemia and hypergammaglobulinemia. No focal bone pains  MEDICAL HISTORY:  Past Medical History:  Diagnosis Date  . Anemia   . Anemia   . CAD (coronary artery disease)   . Chronic lower back pain   . CKD (chronic kidney disease) stage 3, GFR 30-59 ml/min   . COPD (chronic obstructive pulmonary disease) ([place]) dx'd 09/28/2015  . Depression   . Dysphagia   . Heart murmur    "one dr told me I have one"  . History of blood transfusion 09/2015   "blood was low"  . Hyperlipidemia   . Kidney stones   . Lymphoma ([place])   . Myocardial infarction 2006  . Orthostatic hypotension   . PMB (postmenopausal bleeding) 06/12/2015  . Pneumonia 09/28/2015  . Rheumatoid arthritis ([place])    "back" (09/28/2015)  .  Sjogren's syndrome ([place])   . Syncope   . Systemic lupus erythematosus ([place])     SURGICAL HISTORY: Past Surgical History:  Procedure Laterality Date  . BREAST BIOPSY Left   . CARDIAC CATHETERIZATION N/A 10/06/2015   Procedure: Right/Left Heart Cath and Coronary Angiography;  Surgeon: Leonie Man, MD;  Location: [facility];  Service: Cardiovascular;  Laterality: N/A;  . CATARACT EXTRACTION W/ INTRAOCULAR LENS  IMPLANT, BILATERAL Bilateral   . CORONARY ANGIOPLASTY WITH STENT PLACEMENT  2006   DES to mid RCA   . DILATION AND CURETTAGE OF UTERUS    . EYE SURGERY    . LAPAROSCOPIC CHOLECYSTECTOMY    . LITHOTRIPSY  X 3  . LYMPH NODE DISSECTION Left ~ 2000   "neck"  . MASS EXCISION Left ~ 2000   Lymphoma  . RETINAL DETACHMENT SURGERY Right   . TONSILLECTOMY Left ~ 2000  . TUBAL LIGATION      SOCIAL HISTORY: Social History   Social History  . Marital status: Divorced    Spouse name: N/A  . Number of children: N/A  . Years of education: N/A   Occupational History  . Disabled and retired    Social History Main Topics  . Smoking status: Current Every Day Smoker    Packs/day: 0.25    Years: 23.00    Types: Cigarettes    Start date: 07/14/1986    Last attempt to quit: 09/18/2015  . Smokeless tobacco: Never Used  . Alcohol use No  . Drug use: No  .  Sexual activity: No   Other Topics Concern  . Not on file   Social History Narrative  . No narrative on file    FAMILY HISTORY: Family History  Problem Relation Age of Onset  . Coronary artery disease Father   . Coronary artery disease Mother   . Liver disease Mother   . Esophageal cancer Brother   . Breast cancer Sister     ALLERGIES:  is allergic to florinef [fludrocortisone acetate]; iohexol; sulfonamide derivatives; and tape.  MEDICATIONS:  Current Outpatient Prescriptions  Medication Sig Dispense Refill  . acetaminophen (TYLENOL) 325 MG tablet Take 2 tablets (650 mg total) by mouth every 6 (six) hours  as needed for mild pain (or Fever >/= 101). 30 tablet 0  . albuterol (PROVENTIL) (2.5 MG/3ML) 0.083% nebulizer solution Take 3 mLs (2.5 mg total) by nebulization every 6 (six) hours as needed for wheezing or shortness of breath. 75 mL 12  . ALPRAZolam (XANAX) 0.5 MG tablet Take 0.5 mg by mouth 3 (three) times daily.     Marland Kitchen amitriptyline (ELAVIL) 50 MG tablet Take one tablet each night for 6 more days; then take 1 tablet every other night until further instructions by your primary care physician. (Patient taking differently: Take 25 mg by mouth. Take one tablet each night for 6 more days; then take 1 tablet every other night until further instructions by your primary care physician.) 30 tablet 0  . Artificial Tear Solution (SOOTHE XP OP) Apply 1 drop to eye 3 (three) times daily.     Marland Kitchen aspirin 81 MG tablet Take 81 mg by mouth daily.    Marland Kitchen atorvastatin (LIPITOR) 40 MG tablet Take 1 tablet (40 mg total) by mouth every morning.    . Cholecalciferol (VITAMIN D) 400 UNITS capsule Take 400 Units by mouth every evening.     . clobetasol cream (TEMOVATE) [id number] % Apply 1 application topically 2 (two) times daily.    . cycloSPORINE (RESTASIS) 0.05 % ophthalmic emulsion Place 1 drop into both eyes 2 (two) times daily.    Marland Kitchen docusate sodium (COLACE) 100 MG capsule Take 100 mg by mouth 2 (two) times daily as needed for mild constipation.    Marland Kitchen FERREX 150 150 MG capsule Take 150 mg by mouth daily.  5  . folic acid (FOLVITE) 1 MG tablet Take 1 mg by mouth every morning.     Marland Kitchen HYDROcodone-acetaminophen (NORCO/VICODIN) 5-325 MG per tablet Take 1 tablet by mouth 3 (three) times daily as needed for moderate pain.     . hydroxychloroquine (PLAQUENIL) 200 MG tablet Take 200 mg by mouth daily. To be taken with food or milk    . levothyroxine (SYNTHROID, LEVOTHROID) 25 MCG tablet Take 25 mcg by mouth every morning.     . medroxyPROGESTERone (PROVERA) 10 MG tablet Take 1 tablet (10 mg total) by mouth daily. (Patient not taking:  Reported on 03/14/2016) 30 tablet 11  . midodrine (PROAMATINE) 10 MG tablet Take 2 tablets each morning and 1 tablet at bedtime, to help keep your blood pressure from dropping too much. 90 tablet 1  . Multiple Vitamins-Minerals (PRESERVISION/LUTEIN PO) Take 1 capsule by mouth daily.    . nitroGLYCERIN (NITROSTAT) 0.4 MG SL tablet Place 1 tablet (0.4 mg total) under the tongue every 5 (five) minutes as needed for chest pain.    Marland Kitchen omeprazole (PRILOSEC) 20 MG capsule Take 20 mg by mouth daily.     Marland Kitchen oxyCODONE-acetaminophen (PERCOCET/ROXICET) 5-325 MG tablet Take 1-2 tablets by  mouth every 6 (six) hours as needed for severe pain. 10 tablet 0  . raloxifene (EVISTA) 60 MG tablet Take 60 mg by mouth every morning.     . senna-docusate (SENOKOT-S) 8.6-50 MG tablet Take 1 tablet by mouth 2 (two) times daily. For constipation.    . sodium bicarbonate 325 MG tablet Take 650 mg by mouth 3 (three) times daily.     . Travoprost, BAK Free, (TRAVATAN) 0.004 % SOLN ophthalmic solution Place 1 drop into both eyes at bedtime.     No current facility-administered medications for this visit.     REVIEW OF SYSTEMS:    10 Point review of Systems was done is negative except as noted above.  PHYSICAL EXAMINATION: ECOG PERFORMANCE STATUS: 2 - Symptomatic, <50% confined to bed  . Vitals:   04/02/16 1100  BP: 128/72  Pulse: 73  Resp: 18  Temp: 98.2 F (36.8 C)   Filed Weights   .There is no height or weight on file to calculate BMI.  GENERAL:alert, in no acute distress and comfortable SKIN: skin color, texture, turgor are normal, no rashes or significant lesions EYES: normal, conjunctiva are pink and non-injected, sclera clear OROPHARYNX:no exudate, no erythema and lips, buccal mucosa, and tongue normal  NECK: supple, no JVD, thyroid normal size, non-tender, without nodularity LYMPH:  no palpable lymphadenopathy in the cervical, axillary or inguinal LUNGS: clear to auscultation with normal respiratory  effort HEART: regular rate & rhythm,  no murmurs and no lower extremity edema ABDOMEN: abdomen soft, non-tender, normoactive bowel sounds  Musculoskeletal: no cyanosis of digits and no clubbing  PSYCH: alert & oriented x 3 with fluent speech NEURO: no focal motor/sensory deficits  LABORATORY DATA:  I have reviewed the data as listed  Component     Latest Ref Rng & Units 04/02/2016  WBC     3.9 - 10.3 10e3/uL 5.6  NEUT#     1.5 - 6.5 10e3/uL 3.2  Hemoglobin     11.6 - 15.9 g/dL 10.4 (L)  HCT     34.8 - 46.6 % 32.3 (L)  Platelets     145 - 400 10e3/uL 190  MCV     79.5 - 101.0 fL 93.9  MCH     25.1 - 34.0 pg 30.2  MCHC     31.5 - 36.0 g/dL 32.2  RBC     3.70 - 5.45 10e6/uL 3.44 (L)  RDW     11.2 - 14.5 % 13.2  lymph#     0.9 - 3.3 10e3/uL 1.7  MONO#     0.1 - 0.9 10e3/uL 0.5  Eosinophils Absolute     0.0 - 0.5 10e3/uL 0.2  Basophils Absolute     0.0 - 0.1 10e3/uL 0.0  NEUT%     38.4 - 76.8 % 56.9  LYMPH%     14.0 - 49.7 % 29.9  MONO%     0.0 - 14.0 % 9.5  EOS%     0.0 - 7.0 % 3.2  BASO%     0.0 - 2.0 % 0.5  nRBC     0 - 0 % 0  Retic %     0.70 - 2.10 % 1.14  Retic Ct Abs     33.70 - 90.70 10e3/uL 39.22  Immature Retic Fract     1.60 - 10.00 % 4.50   . CBC Latest Ref Rng & Units 11/03/2015 11/02/2015 11/01/2015  WBC 4.0 - 10.5 K/uL 3.8(L) 3.9(L) 4.7  Hemoglobin 12.0 -  15.0 g/dL 8.8(L) 8.3(L) 8.5(L)  Hematocrit 36.0 - 46.0 % 26.4(L) 25.4(L) 25.8(L)  Platelets 150 - 400 K/uL 168 148(L) 149(L)   .Marland Kitchen CMP Latest Ref Rng & Units 11/02/2015 11/01/2015 10/31/2015  Glucose 65 - 99 mg/dL 94 77 83  BUN 6 - 20 mg/dL 27(H) 27(H) 29(H)  Creatinine 0.44 - 1.00 mg/dL 1.61(H) 1.78(H) 1.63(H)  Sodium 135 - 145 mmol/L 135 134(L) 134(L)  Potassium 3.5 - 5.1 mmol/L 3.9 4.2 4.0  Chloride 101 - 111 mmol/L 111 111 111  CO2 22 - 32 mmol/L 20(L) 19(L) 19(L)  Calcium 8.9 - 10.3 mg/dL 8.4(L) 8.3(L) 8.4(L)  Total Protein 6.5 - 8.1 g/dL - - -  Total Bilirubin 0.3 - 1.2 mg/dL - -  -  Alkaline Phos 38 - 126 U/L - - -  AST 15 - 41 U/L - - -  ALT 14 - 54 U/L - - -   Component     Latest Ref Rng & Units 04/02/2016  IgG (Immunoglobin G), Serum     700 - 1600 mg/dL 2,329 (H)  IgA/Immunoglobulin A, Serum     64 - 422 mg/dL 222  IgM, Qn, Serum     26 - 217 mg/dL 17 (L)  Total Protein     6.0 - 8.5 g/dL 7.7  Albumin SerPl Elph-Mcnc     2.9 - 4.4 g/dL 3.4  Alpha 1     0.0 - 0.4 g/dL 0.4  Alpha2 Glob SerPl Elph-Mcnc     0.4 - 1.0 g/dL 0.8  B-Globulin SerPl Elph-Mcnc     0.7 - 1.3 g/dL 1.0  Gamma Glob SerPl Elph-Mcnc     0.4 - 1.8 g/dL 2.2 (H)  M Protein SerPl Elph-Mcnc     Not Observed g/dL Not Observed  Globulin, Total     2.2 - 3.9 g/dL 4.3 (H)  Albumin/Glob SerPl     0.7 - 1.7 0.8  IFE 1      Comment  Please Note (HCV):      Comment  Iron     41 - 142 ug/dL 42  TIBC     236 - 444 ug/dL 261  UIBC     120 - 384 ug/dL 219  %SAT     21 - 57 % 16 (L)  Ig Kappa Free Light Chain     3.3 - 19.4 mg/L 189.9 (H)  Ig Lambda Free Light Chain     5.7 - 26.3 mg/L 60.9 (H)  Kappa/Lambda FluidC Ratio     0.26 - 1.65 3.12 (H)  LDH     125 - 245 U/L 233  Haptoglobin     34 - 200 mg/dL 145  Erythropoietin     2.6 - 18.5 mIU/mL 9.4  Sed Rate     0 - 40 mm/hr 48 (H)  CRP     0.0 - 4.9 mg/L 4.1  Ferritin     9 - 269 ng/ml 241      RADIOGRAPHIC STUDIES: I have personally reviewed the radiological images as listed and agreed with the findings in the report. Ct Head Wo Contrast  Result Date: 03/14/2016 CLINICAL DATA:  [place] over curb, hit head, forehead bruising EXAM: CT HEAD WITHOUT CONTRAST CT CERVICAL SPINE WITHOUT CONTRAST TECHNIQUE: Multidetector CT imaging of the head and cervical spine was performed following the standard protocol without intravenous contrast. Multiplanar CT image reconstructions of the cervical spine were also generated. COMPARISON:  10/28/2015 FINDINGS: CT HEAD FINDINGS Brain: No intracranial hemorrhage, mass effect  or midline shift.  No acute cortical infarction. No mass lesion is noted on this unenhanced scan. Stable cerebral atrophy. Stable mild cerebral atrophy. Ventricular size is stable from prior exam. Vascular: Mild atherosclerotic calcifications of carotid siphon. Skull: No skull fracture is noted. Sinuses/Orbits: Paranasal sinuses and mastoid air cells are unremarkable. Other: Minimal scalp swelling in left frontal region please see axial image 12. CT CERVICAL SPINE FINDINGS Alignment: There is normal alignment. Skull base and vertebrae: No acute fracture or subluxation. Degenerative changes are noted C1-C2 articulation. There is diffuse osteopenia. Anterior spurring noted lower endplate of C3 and C4 vertebral body. Mild anterior spurring lower endplate of C5 vertebral body. Soft tissues and spinal canal: No prevertebral soft tissue swelling. Spinal canal is patent. Disc levels:  Disc spaces are preserved. Upper chest: The visualized lung apices shows bilateral apical pleural parenchymal scarring left greater than right. Some bronchiectasis is noted in left upper lobe. Pleural calcifications are noted bilateral apical. Other: None IMPRESSION: 1. No acute intracranial abnormality. Mild cerebral atrophy. No definite acute cortical infarction. 2. Mild scalp swelling in left frontal region. 3. No cervical spine acute fracture or subluxation. Diffuse osteopenia. Mild degenerative changes as described above. 4. Bilateral lung apices pleural parenchymal scarring left greater than 1. Bilateral apical pleural calcifications are noted. There is bronchiectasis in left apex. Electronically Signed   By: Lahoma Crocker M.D.   On: 03/14/2016 13:45   Ct Cervical Spine Wo Contrast  Result Date: 03/14/2016 CLINICAL DATA:  [place] over curb, hit head, forehead bruising EXAM: CT HEAD WITHOUT CONTRAST CT CERVICAL SPINE WITHOUT CONTRAST TECHNIQUE: Multidetector CT imaging of the head and cervical spine was performed following the standard protocol without  intravenous contrast. Multiplanar CT image reconstructions of the cervical spine were also generated. COMPARISON:  10/28/2015 FINDINGS: CT HEAD FINDINGS Brain: No intracranial hemorrhage, mass effect or midline shift. No acute cortical infarction. No mass lesion is noted on this unenhanced scan. Stable cerebral atrophy. Stable mild cerebral atrophy. Ventricular size is stable from prior exam. Vascular: Mild atherosclerotic calcifications of carotid siphon. Skull: No skull fracture is noted. Sinuses/Orbits: Paranasal sinuses and mastoid air cells are unremarkable. Other: Minimal scalp swelling in left frontal region please see axial image 12. CT CERVICAL SPINE FINDINGS Alignment: There is normal alignment. Skull base and vertebrae: No acute fracture or subluxation. Degenerative changes are noted C1-C2 articulation. There is diffuse osteopenia. Anterior spurring noted lower endplate of C3 and C4 vertebral body. Mild anterior spurring lower endplate of C5 vertebral body. Soft tissues and spinal canal: No prevertebral soft tissue swelling. Spinal canal is patent. Disc levels:  Disc spaces are preserved. Upper chest: The visualized lung apices shows bilateral apical pleural parenchymal scarring left greater than right. Some bronchiectasis is noted in left upper lobe. Pleural calcifications are noted bilateral apical. Other: None IMPRESSION: 1. No acute intracranial abnormality. Mild cerebral atrophy. No definite acute cortical infarction. 2. Mild scalp swelling in left frontal region. 3. No cervical spine acute fracture or subluxation. Diffuse osteopenia. Mild degenerative changes as described above. 4. Bilateral lung apices pleural parenchymal scarring left greater than 1. Bilateral apical pleural calcifications are noted. There is bronchiectasis in left apex. Electronically Signed   By: Lahoma Crocker M.D.   On: 03/14/2016 13:45   Dg Knee Complete 4 Views Left  Result Date: 03/14/2016 CLINICAL DATA:  LEFT knee pain and  bruising after fall outside of doctor's office today. EXAM: LEFT KNEE - COMPLETE 4+ VIEW COMPARISON:  None. FINDINGS: Comminuted nondisplaced patellar  fracture without distraction. No dislocation. No destructive bony lesions, osteopenia. Small suprapatellar joint effusion. Moderate vascular calcifications. Medial knee soft tissue swelling without subcutaneous gas or radiopaque foreign bodies. IMPRESSION: Acute nondisplaced patellar fracture.  No dislocation. Electronically Signed   By: Elon Alas M.D.   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 This pattern would be consistent with a reactive process and is likely related to the patient Sjogrens disease in keeping with elevated sed rate.  Increased Serum FLC with some increased K/L ratio - also appears to be reactive and partly due to differential excretion fo FLC in the urine in the setting of CKD from to other etiologies. No hypercalcemia, no bone pain, no acutely worsening renal function. No clear indication of plasma cell neoplasm at this time.  2) [place] Anemia . hgb was 8.8 on OSH. Rpt labs today shows improvement in hgb to 10.4 No evidence of hemolysis. 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 Unlikely to be Myeloma Ferritin is 241 with iron saturation of 16% --- ferritin appears to be elevated as an accurate phase reactant and hiding some element of Iron deficiency PLAN -continue ferrex 150mg  po daily. -discussed labs findings with patient and my interpretation. -would correct iron deficiency to Iron sat of >30% with PO iron -optimize management of her autoimmune condition as per rheumatology. -if anemia continues to improve with Iron replacement will monitor. -if anemia worsens again might need to consider BM Bx vs empiric use  of EPO after adequate iron replacement. -given low suspicion for clonal plasma cell disorder, improving anemia and alternative explanation of hypergammaglobulinemia would hold of on BM Bx at this time.  RTC with Dr Irene Limbo in 6-8 weeks with rpt labs.   All of the patients questions were answered with apparent satisfaction. The patient knows to call the clinic with any problems, questions or concerns.  I spent 47minutes counseling the patient face to face. The total time spent in the appointment was 60 minutes and more than 50% was on counseling and direct patient cares.    Sullivan Lone MD [place] [place] AAHIVMS [facility] Hematology/Oncology Physician [facility]  (Office):       [phone] (Work cell):  [phone] (Fax):           [phone]  04/02/2016 11:42 AM

## 2016-04-03 LAB — HAPTOGLOBIN: HAPTOGLOBIN: 145 mg/dL (ref 34–200)

## 2016-04-03 LAB — ERYTHROPOIETIN: Erythropoietin: 9.4 m[IU]/mL (ref 2.6–18.5)

## 2016-04-03 LAB — KAPPA/LAMBDA LIGHT CHAINS
IG KAPPA FREE LIGHT CHAIN: 189.9 mg/L — AB (ref 3.3–19.4)
Ig Lambda Free Light Chain: 60.9 mg/L — ABNORMAL HIGH (ref 5.7–26.3)
KAPPA/LAMBDA FLC RATIO: 3.12 — AB (ref 0.26–1.65)

## 2016-04-03 LAB — SEDIMENTATION RATE: Sedimentation Rate-Westergren: 48 mm/hr — ABNORMAL HIGH (ref 0–40)

## 2016-04-03 LAB — C-REACTIVE PROTEIN: CRP: 4.1 mg/L (ref 0.0–4.9)

## 2016-04-04 LAB — MULTIPLE MYELOMA PANEL, SERUM
ALBUMIN/GLOB SERPL: 0.8 (ref 0.7–1.7)
Albumin SerPl Elph-Mcnc: 3.4 g/dL (ref 2.9–4.4)
Alpha 1: 0.4 g/dL (ref 0.0–0.4)
Alpha2 Glob SerPl Elph-Mcnc: 0.8 g/dL (ref 0.4–1.0)
B-Globulin SerPl Elph-Mcnc: 1 g/dL (ref 0.7–1.3)
GAMMA GLOB SERPL ELPH-MCNC: 2.2 g/dL — AB (ref 0.4–1.8)
GLOBULIN, TOTAL: 4.3 g/dL — AB (ref 2.2–3.9)
IGA/IMMUNOGLOBULIN A, SERUM: 222 mg/dL (ref 64–422)
IgG, Qn, Serum: 2329 mg/dL — ABNORMAL HIGH (ref 700–1600)
IgM, Qn, Serum: 17 mg/dL — ABNORMAL LOW (ref 26–217)
Total Protein: 7.7 g/dL (ref 6.0–8.5)

## 2016-05-10 ENCOUNTER — Other Ambulatory Visit: Payer: Self-pay | Admitting: *Deleted

## 2016-05-10 DIAGNOSIS — D638 Anemia in other chronic diseases classified elsewhere: Secondary | ICD-10-CM

## 2016-05-14 ENCOUNTER — Other Ambulatory Visit (HOSPITAL_BASED_OUTPATIENT_CLINIC_OR_DEPARTMENT_OTHER): Payer: Medicare Other

## 2016-05-14 ENCOUNTER — Encounter: Payer: Self-pay | Admitting: Hematology

## 2016-05-14 ENCOUNTER — Ambulatory Visit (HOSPITAL_BASED_OUTPATIENT_CLINIC_OR_DEPARTMENT_OTHER): Payer: Medicare Other | Admitting: Hematology

## 2016-05-14 VITALS — BP 156/85 | HR 86 | Temp 97.6°F | Resp 16 | Wt 119.4 lb

## 2016-05-14 DIAGNOSIS — D638 Anemia in other chronic diseases classified elsewhere: Secondary | ICD-10-CM

## 2016-05-14 DIAGNOSIS — D649 Anemia, unspecified: Secondary | ICD-10-CM | POA: Diagnosis not present

## 2016-05-14 DIAGNOSIS — D892 Hypergammaglobulinemia, unspecified: Secondary | ICD-10-CM

## 2016-05-14 LAB — CBC WITH DIFFERENTIAL/PLATELET
BASO%: 0.1 % (ref 0.0–2.0)
Basophils Absolute: 0 10*3/uL (ref 0.0–0.1)
EOS%: 1.6 % (ref 0.0–7.0)
Eosinophils Absolute: 0.1 10*3/uL (ref 0.0–0.5)
HCT: 37.2 % (ref 34.8–46.6)
HEMOGLOBIN: 11.7 g/dL (ref 11.6–15.9)
LYMPH#: 1.6 10*3/uL (ref 0.9–3.3)
LYMPH%: 20.2 % (ref 14.0–49.7)
MCH: 30.5 pg (ref 25.1–34.0)
MCHC: 31.5 g/dL (ref 31.5–36.0)
MCV: 96.9 fL (ref 79.5–101.0)
MONO#: 0.6 10*3/uL (ref 0.1–0.9)
MONO%: 7.8 % (ref 0.0–14.0)
NEUT%: 70.3 % (ref 38.4–76.8)
NEUTROS ABS: 5.4 10*3/uL (ref 1.5–6.5)
Platelets: 182 10*3/uL (ref 145–400)
RBC: 3.84 10*6/uL (ref 3.70–5.45)
RDW: 13.9 % (ref 11.2–14.5)
WBC: 7.7 10*3/uL (ref 3.9–10.3)

## 2016-05-14 LAB — COMPREHENSIVE METABOLIC PANEL
ALBUMIN: 3.6 g/dL (ref 3.5–5.0)
ALK PHOS: 98 U/L (ref 40–150)
ALT: 16 U/L (ref 0–55)
AST: 19 U/L (ref 5–34)
Anion Gap: 10 mEq/L (ref 3–11)
BILIRUBIN TOTAL: 0.27 mg/dL (ref 0.20–1.20)
BUN: 29.3 mg/dL — AB (ref 7.0–26.0)
CO2: 21 mEq/L — ABNORMAL LOW (ref 22–29)
CREATININE: 1.6 mg/dL — AB (ref 0.6–1.1)
Calcium: 9.8 mg/dL (ref 8.4–10.4)
Chloride: 110 mEq/L — ABNORMAL HIGH (ref 98–109)
EGFR: 31 mL/min/{1.73_m2} — ABNORMAL LOW (ref >90)
GLUCOSE: 84 mg/dL (ref 70–140)
POTASSIUM: 4.5 meq/L (ref 3.5–5.1)
SODIUM: 141 meq/L (ref 136–145)
TOTAL PROTEIN: 8.2 g/dL (ref 6.4–8.3)

## 2016-05-14 LAB — IRON AND TIBC
%SAT: 15 % — ABNORMAL LOW (ref 21–57)
Iron: 43 ug/dL (ref 41–142)
TIBC: 295 ug/dL (ref 236–444)
UIBC: 252 ug/dL (ref 120–384)

## 2016-05-14 LAB — FERRITIN: Ferritin: 235 ng/ml (ref 9–269)

## 2016-05-15 NOTE — Progress Notes (Signed)
Marland Kitchen    HEMATOLOGY/ONCOLOGY CLINIC NOTE  Date of Service: 05/14/2016  Patient Care Team: Cleda Mccreedy, MD as PCP - General (Internal Medicine) Unice Bailey, MD (Internal Medicine) Estanislado Emms, MD (Nephrology) Allyn Kenner, MD (Dermatology) Yehuda Savannah, MD (Cardiology) Tillie Rung (rheumatology)  CHIEF COMPLAINTS/PURPOSE OF CONSULTATION:  Anemia Hypergammaglobulinemia  HISTORY OF PRESENTING ILLNESS:   Tiffany [name] is a wonderful 76 y.o. female who has been referred to [place] by Dr Eula Fried for evaluation and management of anemia and hypergammaglobulinemia.  Patient has multiple medical comorbidities including chronic kidney disease, COPD, previous MI with history of coronary artery disease, Sjogren's syndrome (sicca symptoms, moderate was previously on steroids and plaquenil, follows with Dr Dossie Der), osteoporosis (o Evista).  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 UPEP showed a some of M protein (TP  37.8mg /dL with 28.2% M spike.). She has CKD with creatinine in mid-high 1 range.  She was noted to have NCNC Anemia with hgb of 8.8. No overt bleeding.  She was referred to [place] for evaluation of her anemia and hypergammaglobulinemia. No focal bone pains  INTERVAL HISTORY  Patient is here for her scheduled up. She notes that her patellar injury is resolving. Notes no acute new symptoms. Her anemia has significantly improved with ongoing po iron and hgb is up to 11.7 from 10.4 from 8.8. No new bone pain.  MEDICAL HISTORY:  Past Medical History:  Diagnosis Date  . Anemia   . Anemia   . CAD (coronary artery disease)   . Chronic lower back pain   . CKD (chronic kidney disease) stage 3, GFR 30-59 ml/min   . COPD (chronic obstructive pulmonary disease) ([place]) dx'd 09/28/2015  . Depression   . Dysphagia   . Heart murmur    "one dr told me I have one"  . History of blood transfusion 09/2015   "blood was  low"  . Hyperlipidemia   . Kidney stones   . Lymphoma ([place])   . Myocardial infarction 2006  . Orthostatic hypotension   . PMB (postmenopausal bleeding) 06/12/2015  . Pneumonia 09/28/2015  . Rheumatoid arthritis ([place])    "back" (09/28/2015)  . Sjogren's syndrome ([place])   . Syncope   . Systemic lupus erythematosus ([place])     SURGICAL HISTORY: Past Surgical History:  Procedure Laterality Date  . BREAST BIOPSY Left   . CARDIAC CATHETERIZATION N/A 10/06/2015   Procedure: Right/Left Heart Cath and Coronary Angiography;  Surgeon: Leonie Man, MD;  Location: [facility];  Service: Cardiovascular;  Laterality: N/A;  . CATARACT EXTRACTION W/ INTRAOCULAR LENS  IMPLANT, BILATERAL Bilateral   . CORONARY ANGIOPLASTY WITH STENT PLACEMENT  2006   DES to mid RCA   . DILATION AND CURETTAGE OF UTERUS    . EYE SURGERY    . LAPAROSCOPIC CHOLECYSTECTOMY    . LITHOTRIPSY  X 3  . LYMPH NODE DISSECTION Left ~ 2000   "neck"  . MASS EXCISION Left ~ 2000   Lymphoma  . RETINAL DETACHMENT SURGERY Right   . TONSILLECTOMY Left ~ 2000  . TUBAL LIGATION      SOCIAL HISTORY: Social History   Social History  . Marital status: Divorced    Spouse name: N/A  . Number of children: N/A  . Years of education: N/A   Occupational History  . Disabled and retired    Social History Main Topics  . Smoking status: Current Every Day  Smoker    Packs/day: 0.25    Years: 23.00    Types: Cigarettes    Start date: 07/14/1986    Last attempt to quit: 09/18/2015  . Smokeless tobacco: Never Used  . Alcohol use No  . Drug use: No  . Sexual activity: No   Other Topics Concern  . Not on file   Social History Narrative  . No narrative on file    FAMILY HISTORY: Family History  Problem Relation Age of Onset  . Coronary artery disease Father   . Coronary artery disease Mother   . Liver disease Mother   . Esophageal cancer Brother   . Breast cancer Sister     ALLERGIES:  is allergic to florinef  [fludrocortisone acetate]; iohexol; sulfonamide derivatives; and tape.  MEDICATIONS:  Current Outpatient Prescriptions  Medication Sig Dispense Refill  . acetaminophen (TYLENOL) 325 MG tablet Take 2 tablets (650 mg total) by mouth every 6 (six) hours as needed for mild pain (or Fever >/= 101). 30 tablet 0  . albuterol (PROVENTIL) (2.5 MG/3ML) 0.083% nebulizer solution Take 3 mLs (2.5 mg total) by nebulization every 6 (six) hours as needed for wheezing or shortness of breath. 75 mL 12  . ALPRAZolam (XANAX) 0.5 MG tablet Take 0.5 mg by mouth 3 (three) times daily.     Marland Kitchen amitriptyline (ELAVIL) 50 MG tablet Take one tablet each night for 6 more days; then take 1 tablet every other night until further instructions by your primary care physician. (Patient taking differently: Take 25 mg by mouth. Take one tablet each night for 6 more days; then take 1 tablet every other night until further instructions by your primary care physician.) 30 tablet 0  . Artificial Tear Solution (SOOTHE XP OP) Apply 1 drop to eye 3 (three) times daily.     Marland Kitchen aspirin 81 MG tablet Take 81 mg by mouth daily.    Marland Kitchen atorvastatin (LIPITOR) 40 MG tablet Take 1 tablet (40 mg total) by mouth every morning.    . Cholecalciferol (VITAMIN D) 400 UNITS capsule Take 400 Units by mouth every evening.     . clobetasol cream (TEMOVATE) [id number] % Apply 1 application topically 2 (two) times daily.    . cycloSPORINE (RESTASIS) 0.05 % ophthalmic emulsion Place 1 drop into both eyes 2 (two) times daily.    Marland Kitchen docusate sodium (COLACE) 100 MG capsule Take 100 mg by mouth 2 (two) times daily as needed for mild constipation.    Marland Kitchen FERREX 150 150 MG capsule Take 150 mg by mouth daily.  5  . folic acid (FOLVITE) 1 MG tablet Take 1 mg by mouth every morning.     Marland Kitchen HYDROcodone-acetaminophen (NORCO/VICODIN) 5-325 MG per tablet Take 1 tablet by mouth 3 (three) times daily as needed for moderate pain.     . hydroxychloroquine (PLAQUENIL) 200 MG tablet Take 200 mg  by mouth daily. To be taken with food or milk    . levothyroxine (SYNTHROID, LEVOTHROID) 25 MCG tablet Take 25 mcg by mouth every morning.     . medroxyPROGESTERone (PROVERA) 10 MG tablet Take 1 tablet (10 mg total) by mouth daily. 30 tablet 11  . midodrine (PROAMATINE) 10 MG tablet Take 2 tablets each morning and 1 tablet at bedtime, to help keep your blood pressure from dropping too much. 90 tablet 1  . Multiple Vitamins-Minerals (PRESERVISION/LUTEIN PO) Take 1 capsule by mouth daily.    . nitroGLYCERIN (NITROSTAT) 0.4 MG SL tablet Place 1 tablet (0.4 mg  total) under the tongue every 5 (five) minutes as needed for chest pain.    Marland Kitchen omeprazole (PRILOSEC) 20 MG capsule Take 20 mg by mouth daily.     Marland Kitchen oxyCODONE-acetaminophen (PERCOCET/ROXICET) 5-325 MG tablet Take 1-2 tablets by mouth every 6 (six) hours as needed for severe pain. 10 tablet 0  . raloxifene (EVISTA) 60 MG tablet Take 60 mg by mouth every morning.     . senna-docusate (SENOKOT-S) 8.6-50 MG tablet Take 1 tablet by mouth 2 (two) times daily. For constipation.    . sodium bicarbonate 325 MG tablet Take 650 mg by mouth 3 (three) times daily.     . Travoprost, BAK Free, (TRAVATAN) 0.004 % SOLN ophthalmic solution Place 1 drop into both eyes at bedtime.     No current facility-administered medications for this visit.     REVIEW OF SYSTEMS:    10 Point review of Systems was done is negative except as noted above.  PHYSICAL EXAMINATION: ECOG PERFORMANCE STATUS: 2 - Symptomatic, <50% confined to bed  . Vitals:   05/14/16 1258  BP: (!) 156/85  Pulse: 86  Resp: 16  Temp: 97.6 F (36.4 C)   Filed Weights   05/14/16 1258  Weight: 119 lb 6.4 oz (54.2 kg)   .Body mass index is 17.13 kg/m.  GENERAL:alert, in no acute distress and comfortable SKIN: skin color, texture, turgor are normal, no rashes or significant lesions EYES: normal, conjunctiva are pink and non-injected, sclera clear OROPHARYNX:no exudate, no erythema and  lips, buccal mucosa, and tongue normal  NECK: supple, no JVD, thyroid normal size, non-tender, without nodularity LYMPH:  no palpable lymphadenopathy in the cervical, axillary or inguinal LUNGS: clear to auscultation with normal respiratory effort HEART: regular rate & rhythm,  no murmurs and no lower extremity edema ABDOMEN: abdomen soft, non-tender, normoactive bowel sounds  Musculoskeletal: no cyanosis of digits and no clubbing  PSYCH: alert & oriented x 3 with fluent speech NEURO: no focal motor/sensory deficits  LABORATORY DATA:  I have reviewed the data as listed . CBC Latest Ref Rng & Units 05/14/2016 04/02/2016 11/03/2015  WBC 3.9 - 10.3 10e3/uL 7.7 5.6 3.8(L)  Hemoglobin 11.6 - 15.9 g/dL 11.7 10.4(L) 8.8(L)  Hematocrit 34.8 - 46.6 % 37.2 32.3(L) 26.4(L)  Platelets 145 - 400 10e3/uL 182 190 168   . CMP Latest Ref Rng & Units 05/14/2016 04/02/2016 04/02/2016  Glucose 70 - 140 mg/dl 84 86 -  BUN 7.0 - 26.0 mg/dL 29.3(H) 35.2(H) -  Creatinine 0.6 - 1.1 mg/dL 1.6(H) 1.7(H) -  Sodium 136 - 145 mEq/L 141 139 -  Potassium 3.5 - 5.1 mEq/L 4.5 4.2 -  Chloride 101 - 111 mmol/L - - -  CO2 22 - 29 mEq/L 21(L) 19(L) -  Calcium 8.4 - 10.4 mg/dL 9.8 10.0 -  Total Protein 6.4 - 8.3 g/dL 8.2 8.5(H) 7.7  Total Bilirubin 0.20 - 1.20 mg/dL 0.27 0.28 -  Alkaline Phos 40 - 150 U/L 98 69 -  AST 5 - 34 U/L 19 22 -  ALT 0 - 55 U/L 16 18 -   Component     Latest Ref Rng & Units 05/14/2016  Iron     41 - 142 ug/dL 43  TIBC     236 - 444 ug/dL 295  UIBC     120 - 384 ug/dL 252  %SAT     21 - 57 % 15 (L)  Ferritin     9 - 269 ng/ml 235  Component     Latest Ref Rng & Units 04/02/2016  WBC     3.9 - 10.3 10e3/uL 5.6  NEUT#     1.5 - 6.5 10e3/uL 3.2  Hemoglobin     11.6 - 15.9 g/dL 10.4 (L)  HCT     34.8 - 46.6 % 32.3 (L)  Platelets     145 - 400 10e3/uL 190  MCV     79.5 - 101.0 fL 93.9  MCH     25.1 - 34.0 pg 30.2  MCHC     31.5 - 36.0 g/dL 32.2  RBC     3.70 - 5.45 10e6/uL  3.44 (L)  RDW     11.2 - 14.5 % 13.2  lymph#     0.9 - 3.3 10e3/uL 1.7  MONO#     0.1 - 0.9 10e3/uL 0.5  Eosinophils Absolute     0.0 - 0.5 10e3/uL 0.2  Basophils Absolute     0.0 - 0.1 10e3/uL 0.0  NEUT%     38.4 - 76.8 % 56.9  LYMPH%     14.0 - 49.7 % 29.9  MONO%     0.0 - 14.0 % 9.5  EOS%     0.0 - 7.0 % 3.2  BASO%     0.0 - 2.0 % 0.5  nRBC     0 - 0 % 0  Retic %     0.70 - 2.10 % 1.14  Retic Ct Abs     33.70 - 90.70 10e3/uL 39.22  Immature Retic Fract     1.60 - 10.00 % 4.50   . CBC Latest Ref Rng & Units 05/14/2016 04/02/2016 11/03/2015  WBC 3.9 - 10.3 10e3/uL 7.7 5.6 3.8(L)  Hemoglobin 11.6 - 15.9 g/dL 11.7 10.4(L) 8.8(L)  Hematocrit 34.8 - 46.6 % 37.2 32.3(L) 26.4(L)  Platelets 145 - 400 10e3/uL 182 190 168   .Marland Kitchen CMP Latest Ref Rng & Units 05/14/2016 04/02/2016 04/02/2016  Glucose 70 - 140 mg/dl 84 86 -  BUN 7.0 - 26.0 mg/dL 29.3(H) 35.2(H) -  Creatinine 0.6 - 1.1 mg/dL 1.6(H) 1.7(H) -  Sodium 136 - 145 mEq/L 141 139 -  Potassium 3.5 - 5.1 mEq/L 4.5 4.2 -  Chloride 101 - 111 mmol/L - - -  CO2 22 - 29 mEq/L 21(L) 19(L) -  Calcium 8.4 - 10.4 mg/dL 9.8 10.0 -  Total Protein 6.4 - 8.3 g/dL 8.2 8.5(H) 7.7  Total Bilirubin 0.20 - 1.20 mg/dL 0.27 0.28 -  Alkaline Phos 40 - 150 U/L 98 69 -  AST 5 - 34 U/L 19 22 -  ALT 0 - 55 U/L 16 18 -   Component     Latest Ref Rng & Units 04/02/2016  IgG (Immunoglobin G), Serum     700 - 1600 mg/dL 2,329 (H)  IgA/Immunoglobulin A, Serum     64 - 422 mg/dL 222  IgM, Qn, Serum     26 - 217 mg/dL 17 (L)  Total Protein     6.0 - 8.5 g/dL 7.7  Albumin SerPl Elph-Mcnc     2.9 - 4.4 g/dL 3.4  Alpha 1     0.0 - 0.4 g/dL 0.4  Alpha2 Glob SerPl Elph-Mcnc     0.4 - 1.0 g/dL 0.8  B-Globulin SerPl Elph-Mcnc     0.7 - 1.3 g/dL 1.0  Gamma Glob SerPl Elph-Mcnc     0.4 - 1.8 g/dL 2.2 (H)  M Protein SerPl Elph-Mcnc     Not Observed  g/dL Not Observed  Globulin, Total     2.2 - 3.9 g/dL 4.3 (H)  Albumin/Glob SerPl     0.7 - 1.7  0.8  IFE 1      Comment  Please Note (HCV):      Comment  Iron     41 - 142 ug/dL 42  TIBC     236 - 444 ug/dL 261  UIBC     120 - 384 ug/dL 219  %SAT     21 - 57 % 16 (L)  Ig Kappa Free Light Chain     3.3 - 19.4 mg/L 189.9 (H)  Ig Lambda Free Light Chain     5.7 - 26.3 mg/L 60.9 (H)  Kappa/Lambda FluidC Ratio     0.26 - 1.65 3.12 (H)  LDH     125 - 245 U/L 233  Haptoglobin     34 - 200 mg/dL 145  Erythropoietin     2.6 - 18.5 mIU/mL 9.4  Sed Rate     0 - 40 mm/hr 48 (H)  CRP     0.0 - 4.9 mg/L 4.1  Ferritin     9 - 269 ng/ml 241      RADIOGRAPHIC STUDIES: I have personally reviewed the radiological images as listed and agreed with the findings in the report. No results found.  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 This pattern would be consistent with a reactive process and is likely related to the patient Sjogrens disease in keeping with elevated sed rate.  Increased Serum FLC with some increased K/L ratio - also appears to be reactive and partly due to differential excretion fo FLC in the urine in the setting of CKD from to other etiologies. No hypercalcemia, no bone pain, no acutely worsening renal function. No clear indication of plasma cell neoplasm at this time.  2) [place] Anemia . hgb was 8.8 on OSH thus has progressively improved to  10.4 and today to 11.7 -- further reducing likelihood of a plasma cell neoplasm. No evidence of hemolysis. 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 Ferritin is 241 with iron saturation of 16% --- ferritin appears to be elevated as an accurate phase reactant and hiding some element of Iron deficiency. Improve in hgb likely from combination of iron replacement and ?improvement in systemic inflammation. PLAN -continue ferrex 150mg  po daily and a  daily multivitamin --would correct iron deficiency to Iron sat of >30% with PO iron -optimize management of her autoimmune condition as per rheumatology. -if anemia worsens again might need to consider BM Bx vs empiric use of EPO after adequate iron replacement. -given low suspicion for clonal plasma cell disorder, improving anemia and alternative explanation of hypergammaglobulinemia would hold of on BM Bx at this time.  Continue followup with PCP and rheumatology. Plz reconsults [place] if any new questions or concerns arise.   All of the patients questions were answered with apparent satisfaction. The patient knows to call the clinic with any problems, questions or concerns.  I spent 1minutes counseling the patient face to face. The total time spent in the appointment was 25 minutes and more than 50% was on counseling and direct patient cares.    Sullivan Lone MD [place] AAHIVMS [facility] Hematology/Oncology Physician [place]  (Office):  [phone] (Work cell):  [phone] (Fax):           [phone]

## 2016-06-24 ENCOUNTER — Ambulatory Visit (INDEPENDENT_AMBULATORY_CARE_PROVIDER_SITE_OTHER): Payer: Medicare Other | Admitting: Obstetrics & Gynecology

## 2016-06-24 ENCOUNTER — Encounter: Payer: Self-pay | Admitting: Obstetrics & Gynecology

## 2016-06-24 VITALS — BP 120/80 | HR 84 | Wt 167.0 lb

## 2016-06-24 DIAGNOSIS — N950 Postmenopausal bleeding: Secondary | ICD-10-CM | POA: Diagnosis not present

## 2016-06-24 MED ORDER — MEDROXYPROGESTERONE ACETATE 10 MG PO TABS: 10.0000 mg | ORAL_TABLET | Freq: Every day | ORAL | 11 refills | Status: DC

## 2016-06-24 NOTE — Progress Notes (Signed)
Chief Complaint  Patient presents with  . Follow-up    Blood pressure 120/80, pulse 84, weight 167 lb (75.8 kg).  76 y.o. G3P3 No LMP recorded. Patient is postmenopausal. The current method of family planning is post menopausal status.  Outpatient Encounter Prescriptions as of 06/24/2016  Medication Sig  . albuterol (PROVENTIL) (2.5 MG/3ML) 0.083% nebulizer solution Take 3 mLs (2.5 mg total) by nebulization every 6 (six) hours as needed for wheezing or shortness of breath.  . ALPRAZolam (XANAX) 0.5 MG tablet Take 0.5 mg by mouth 3 (three) times daily.   Marland Kitchen amitriptyline (ELAVIL) 50 MG tablet Take one tablet each night for 6 more days; then take 1 tablet every other night until further instructions by your primary care physician. (Patient taking differently: Take 25 mg by mouth. Take one tablet each night for 6 more days; then take 1 tablet every other night until further instructions by your primary care physician.)  . Artificial Tear Solution (SOOTHE XP OP) Apply 1 drop to eye 3 (three) times daily.   Marland Kitchen aspirin 81 MG tablet Take 81 mg by mouth daily.  Marland Kitchen atorvastatin (LIPITOR) 40 MG tablet Take 1 tablet (40 mg total) by mouth every morning.  . Cholecalciferol (VITAMIN D) 400 UNITS capsule Take 400 Units by mouth every evening.   . clobetasol cream (TEMOVATE) [id number] % Apply 1 application topically 2 (two) times daily.  . cycloSPORINE (RESTASIS) 0.05 % ophthalmic emulsion Place 1 drop into both eyes 2 (two) times daily.  Marland Kitchen docusate sodium (COLACE) 100 MG capsule Take 100 mg by mouth 2 (two) times daily as needed for mild constipation.  Marland Kitchen FERREX 150 150 MG capsule Take 150 mg by mouth daily.  . folic acid (FOLVITE) 1 MG tablet Take 1 mg by mouth every morning.   . hydroxychloroquine (PLAQUENIL) 200 MG tablet Take 200 mg by mouth daily. To be taken with food or milk  . levothyroxine (SYNTHROID, LEVOTHROID) 25 MCG tablet Take 25 mcg by mouth every morning.   . medroxyPROGESTERone (PROVERA)  10 MG tablet Take 1 tablet (10 mg total) by mouth daily.  . midodrine (PROAMATINE) 10 MG tablet Take 2 tablets each morning and 1 tablet at bedtime, to help keep your blood pressure from dropping too much.  . Multiple Vitamins-Minerals (PRESERVISION/LUTEIN PO) Take 1 capsule by mouth daily.  Marland Kitchen omeprazole (PRILOSEC) 20 MG capsule Take 20 mg by mouth daily.   . raloxifene (EVISTA) 60 MG tablet Take 60 mg by mouth every morning.   . senna-docusate (SENOKOT-S) 8.6-50 MG tablet Take 1 tablet by mouth 2 (two) times daily. For constipation.  . sodium bicarbonate 325 MG tablet Take 650 mg by mouth 3 (three) times daily.   . Travoprost, BAK Free, (TRAVATAN) 0.004 % SOLN ophthalmic solution Place 1 drop into both eyes at bedtime.  . [DISCONTINUED] HYDROcodone-acetaminophen (NORCO/VICODIN) 5-325 MG per tablet Take 1 tablet by mouth 3 (three) times daily as needed for moderate pain.   . [DISCONTINUED] medroxyPROGESTERone (PROVERA) 10 MG tablet Take 1 tablet (10 mg total) by mouth daily.  Marland Kitchen acetaminophen (TYLENOL) 325 MG tablet Take 2 tablets (650 mg total) by mouth every 6 (six) hours as needed for mild pain (or Fever >/= 101). (Patient not taking: Reported on 06/24/2016)  . nitroGLYCERIN (NITROSTAT) 0.4 MG SL tablet Place 1 tablet (0.4 mg total) under the tongue every 5 (five) minutes as needed for chest pain. (Patient not taking: Reported on 06/24/2016)  . [DISCONTINUED] oxyCODONE-acetaminophen (PERCOCET/ROXICET) 5-325  MG tablet Take 1-2 tablets by mouth every 6 (six) hours as needed for severe pain.   No facility-administered encounter medications on file as of 06/24/2016.     Subjective Pt saw me last February with PMB and a thickened endometrium EMB was benign Placed on provera for suppression with occasional spotting Sonogram for endometrial e was 1.9 mm on the provera In today for re evluation  Has occasional very light spotting nothing heavy Taking her provera 10 mg daily Objective   Pertinent  ROS No burning with urination, frequency or urgency No nausea, vomiting or diarrhea Nor fever chills or other constitutional symptoms   Labs or studies reviewed    Impression Diagnoses this Encounter::   ICD-9-CM ICD-10-CM   1. Post-menopausal bleeding 627.1 N95.0     Established relevant diagnosis(es):   Plan/Recommendations: Meds ordered this encounter  Medications  . medroxyPROGESTERone (PROVERA) 10 MG tablet    Sig: Take 1 tablet (10 mg total) by mouth daily.    Dispense:  30 tablet    Refill:  11    Labs or Scans Ordered: No orders of the defined types were placed in this encounter.   Management:: Continue provera 10 mg daily for endometrial suppression, if bleeding worsens will re evaluate with sonogram in the meantime no evlaution needed  Follow up Return in about 1 year (around 06/24/2017) for Follow up, yearly, with Dr Elonda Husky.        Face to face time:  15 minutes  Greater than 50% of the visit time was spent in counseling and coordination of care with the patient.  The summary and outline of the counseling and care coordination is summarized in the note above.   All questions were answered.  Past Medical History:  Diagnosis Date  . Anemia   . Anemia   . CAD (coronary artery disease)   . Chronic lower back pain   . CKD (chronic kidney disease) stage 3, GFR 30-59 ml/min   . COPD (chronic obstructive pulmonary disease) ([place]) dx'd 09/28/2015  . Depression   . Dysphagia   . Heart murmur    "one dr told me I have one"  . History of blood transfusion 09/2015   "blood was low"  . Hyperlipidemia   . Kidney stones   . Lymphoma ([place])   . Myocardial infarction 2006  . Orthostatic hypotension   . PMB (postmenopausal bleeding) 06/12/2015  . Pneumonia 09/28/2015  . Rheumatoid arthritis ([place])    "back" (09/28/2015)  . 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 Syncope   . Systemic lupus erythematosus ([place])     Past Surgical History:  Procedure Laterality Date  .  BREAST BIOPSY Left   . CARDIAC CATHETERIZATION N/A 10/06/2015   Procedure: Right/Left Heart Cath and Coronary Angiography;  Surgeon: Leonie Man, MD;  Location: [facility];  Service: Cardiovascular;  Laterality: N/A;  . CATARACT EXTRACTION W/ INTRAOCULAR LENS  IMPLANT, BILATERAL Bilateral   . CORONARY ANGIOPLASTY WITH STENT PLACEMENT  2006   DES to mid RCA   . DILATION AND CURETTAGE OF UTERUS    . EYE SURGERY    . LAPAROSCOPIC CHOLECYSTECTOMY    . LITHOTRIPSY  X 3  . LYMPH NODE DISSECTION Left ~ 2000   "neck"  . MASS EXCISION Left ~ 2000   Lymphoma  . RETINAL DETACHMENT SURGERY Right   . TONSILLECTOMY Left ~ 2000  . TUBAL LIGATION      OB History    Gravida Para Term Preterm  AB Living   3 3       3    SAB TAB Ectopic Multiple Live Births           3      Allergies  Allergen Reactions  . Florinef [Fludrocortisone Acetate]     Malaise: Discomfort   . Iohexol Other (See Comments)    Pt. States that Dr. Florene Glen her kidney Dr. Does not want her to receive iv dye due to increased risk of kidney failure.   . Sulfonamide Derivatives Hives  . Tape Rash    ADHESIVE    Social History   Social History  . Marital status: Divorced    Spouse name: N/A  . Number of children: N/A  . Years of education: N/A   Occupational History  . Disabled and retired    Social History Main Topics  . Smoking status: Current Every Day Smoker    Packs/day: 0.25    Years: 23.00    Types: Cigarettes    Start date: 07/14/1986    Last attempt to quit: 09/18/2015  . Smokeless tobacco: Never Used  . Alcohol use No  . Drug use: No  . Sexual activity: No   Other Topics Concern  . None   Social History Narrative  . None    Family History  Problem Relation Age of Onset  . Coronary artery disease Father   . Coronary artery disease Mother   . Liver disease Mother   . Esophageal cancer Brother   . Breast cancer Sister

## 2016-06-27 ENCOUNTER — Ambulatory Visit: Payer: Medicare Other | Admitting: Obstetrics & Gynecology

## 2016-07-03 ENCOUNTER — Encounter: Payer: Self-pay | Admitting: Pediatrics

## 2016-07-03 ENCOUNTER — Ambulatory Visit (INDEPENDENT_AMBULATORY_CARE_PROVIDER_SITE_OTHER): Payer: Medicare Other | Admitting: Pediatrics

## 2016-07-03 VITALS — BP 134/82 | HR 77 | Temp 97.7°F | Ht 70.0 in | Wt 117.0 lb

## 2016-07-03 DIAGNOSIS — G8929 Other chronic pain: Secondary | ICD-10-CM

## 2016-07-03 DIAGNOSIS — I10 Essential (primary) hypertension: Secondary | ICD-10-CM | POA: Diagnosis not present

## 2016-07-03 DIAGNOSIS — M3500 Sicca syndrome, unspecified: Secondary | ICD-10-CM

## 2016-07-03 DIAGNOSIS — F419 Anxiety disorder, unspecified: Secondary | ICD-10-CM

## 2016-07-03 DIAGNOSIS — D649 Anemia, unspecified: Secondary | ICD-10-CM

## 2016-07-03 DIAGNOSIS — E039 Hypothyroidism, unspecified: Secondary | ICD-10-CM | POA: Diagnosis not present

## 2016-07-03 DIAGNOSIS — M545 Low back pain: Secondary | ICD-10-CM

## 2016-07-03 DIAGNOSIS — M549 Dorsalgia, unspecified: Secondary | ICD-10-CM

## 2016-07-03 MED ORDER — FOLIC ACID 1 MG PO TABS: 1.0000 mg | ORAL_TABLET | Freq: Every morning | ORAL | 5 refills | Status: DC

## 2016-07-03 MED ORDER — CITALOPRAM HYDROBROMIDE 10 MG PO TABS: 10.0000 mg | ORAL_TABLET | Freq: Every day | ORAL | 2 refills | Status: DC

## 2016-07-03 MED ORDER — HYDROCODONE-ACETAMINOPHEN 5-325 MG PO TABS
1.0000 | ORAL_TABLET | Freq: Three times a day (TID) | ORAL | 0 refills | Status: DC | PRN
Start: 2016-07-03 — End: 2016-08-08

## 2016-07-03 MED ORDER — ALPRAZOLAM 0.5 MG PO TABS: 0.2500 mg | ORAL_TABLET | Freq: Two times a day (BID) | ORAL | 0 refills | Status: DC | PRN

## 2016-07-03 MED ORDER — PAROXETINE HCL 10 MG PO TABS: 10.0000 mg | ORAL_TABLET | Freq: Every day | ORAL | 2 refills | Status: DC

## 2016-07-03 NOTE — Progress Notes (Signed)
Subjective:   Patient ID: Tiffany [name], female    DOB: [birth year], 76 y.o.   MRN: [id number] CC: Establish Care  HPI: Tiffany [name] is a 76 y.o. female presenting for Establish Care  Had colonoscopy and EGD at [facility] within last 6 months Pt says she was told all looked fine  Feels unsteady on her feet off and on [place] when sun light hit her eyes coming out of eye doctor several months ago off a curb, broke her knee cap No recent falls  Breathing has been doing fine Not recently needing albuterol  Did get flu shot this year in October  Takes xanax apprx twice a day for anxiety Also thinks it helps her sleep Has been having a hard time sleeping for past several years hasnt tried anything else for sleep Has a hard time stopping her thoughts, worries a lot at night and during the day, keeps her awake  Takes vicodin apprx twice a day for lower back pain Hurts more when she is up walking a lot Has history of arthritis Pain does not go down backs of her legs  Weight has been stable Eats regular meals nutirtion supplements daily  Past Medical History:  Diagnosis Date  . Anemia   . Anemia   . CAD (coronary artery disease)   . Chronic lower back pain   . CKD (chronic kidney disease) stage 3, GFR 30-59 ml/min   . COPD (chronic obstructive pulmonary disease) ([place]) dx'd 09/28/2015  . Depression   . Dysphagia   . Heart murmur    "one dr told me I have one"  . History of blood transfusion 09/2015   "blood was low"  . Hyperlipidemia   . Kidney stones   . Lymphoma ([place])   . Myocardial infarction 2006  . Orthostatic hypotension   . PMB (postmenopausal bleeding) 06/12/2015  . Pneumonia 09/28/2015  . Rheumatoid arthritis ([place])    "back" (09/28/2015)  . Sjogren's syndrome ([place])   . Syncope   . Systemic lupus erythematosus (HCC)    Family History  Problem Relation Age of Onset  . Coronary artery disease Father   . Coronary artery disease Mother   . Liver disease Mother   .  Esophageal cancer Brother   . Breast cancer Sister    Social History   Social History  . Marital status: Divorced    Spouse name: N/A  . Number of children: N/A  . Years of education: N/A   Occupational History  . Disabled and retired    Social History Main Topics  . Smoking status: Current Every Day Smoker    Packs/day: 0.25    Years: 23.00    Types: Cigarettes    Start date: 07/14/1986    Last attempt to quit: 09/18/2015  . Smokeless tobacco: Never Used  . Alcohol use No  . Drug use: No  . Sexual activity: No   Other Topics Concern  . None   Social History Narrative  . None   ROS: All systems negative other than what is in HPI  Objective:    BP 134/82   Pulse 77   Temp 97.7 F (36.5 C) (Oral)   Ht '5\' 10"'  (1.778 m)   Wt 117 lb (53.1 kg)   BMI 16.79 kg/m   Wt Readings from Last 3 Encounters:  07/03/16 117 lb (53.1 kg)  06/24/16 167 lb (75.8 kg)  05/14/16 119 lb 6.4 oz (54.2 kg)    Gen: NAD, alert, cooperative with  exam, NCAT EYES: EOMI, no conjunctival injection, or no icterus ENT:  TMs pearly gray b/l, OP without erythema LYMPH: no cervical LAD CV: NRRR, normal S1/S2, no murmur, distal pulses 2+ b/l Resp: CTABL, no wheezes, normal WOB Abd: +BS, soft, scaphoid, NTND. no guarding or organomegaly Neuro: Alert and oriented  Assessment & Plan:  Tiffany [name] was seen today for establish care.  Diagnoses and all orders for this visit:  hypotension On midodrine for orthostatic hypotension, not on BP meds now -     CMP14+EGFR  Sjogren's syndrome, with unspecified organ involvement (HCC) Dry mouth, uses salivary replacement as needed  Hypothyroidism, unspecified type Cont levothyroxine, check TSH -     TSH  Anxiety Ongoing symptoms, makes it hard to sleep Discussed goal of stopping xanax over next few months, on vicodin for back pain, I prefer to get her off of one of them Will start paroxetine for anxiety Take xanax 1/2 to whole tab no more than twice a  day Must be seen for refills -     ALPRAZolam (XANAX) 0.5 MG tablet; Take 0.5-1 tablets (0.25-0.5 mg total) by mouth 2 (two) times daily as needed for anxiety. DO NOT FILL UNTIL 07/07/2016 -     PARoxetine (PAXIL) 10 MG tablet; Take 1 tablet (10 mg total) by mouth daily.  Chronic bilateral low back pain without sciatica Chronic problem Has been taking usually just twice a day Does help her do her ADLs Will need f/u appt for pain contract, briefly discussed with pt and daughter, both aware -     HYDROcodone-acetaminophen (NORCO/VICODIN) 5-325 MG tablet; Take 1 tablet by mouth every 8 (eight) hours as needed for moderate pain.  Anemia, unspecified type Improved Hg last check -     CBC with Differential/Platelet -     folic acid (FOLVITE) 1 MG tablet; Take 1 tablet (1 mg total) by mouth every morning.   Follow up plan: Return in about 4 weeks (around 07/31/2016) for follow up. multiple med problems. Assunta Found, MD [place]

## 2016-07-04 LAB — CBC WITH DIFFERENTIAL/PLATELET
BASOS: 0 %
Basophils Absolute: 0 10*3/uL (ref 0.0–0.2)
EOS (ABSOLUTE): 0.3 10*3/uL (ref 0.0–0.4)
EOS: 5 %
HEMATOCRIT: 33.2 % — AB (ref 34.0–46.6)
HEMOGLOBIN: 11.1 g/dL (ref 11.1–15.9)
IMMATURE GRANS (ABS): 0 10*3/uL (ref 0.0–0.1)
Immature Granulocytes: 0 %
Lymphocytes Absolute: 1.6 10*3/uL (ref 0.7–3.1)
Lymphs: 23 %
MCH: 31 pg (ref 26.6–33.0)
MCHC: 33.4 g/dL (ref 31.5–35.7)
MCV: 93 fL (ref 79–97)
MONOCYTES: 10 %
Monocytes Absolute: 0.7 10*3/uL (ref 0.1–0.9)
Neutrophils Absolute: 4.2 10*3/uL (ref 1.4–7.0)
Neutrophils: 62 %
Platelets: 195 10*3/uL (ref 150–379)
RBC: 3.58 x10E6/uL — ABNORMAL LOW (ref 3.77–5.28)
RDW: 14.3 % (ref 12.3–15.4)
WBC: 6.8 10*3/uL (ref 3.4–10.8)

## 2016-07-04 LAB — CMP14+EGFR
ALBUMIN: 4 g/dL (ref 3.5–4.8)
ALK PHOS: 69 IU/L (ref 39–117)
ALT: 11 IU/L (ref 0–32)
AST: 15 IU/L (ref 0–40)
Albumin/Globulin Ratio: 1.4 (ref 1.2–2.2)
BUN/Creatinine Ratio: 16 (ref 12–28)
BUN: 27 mg/dL (ref 8–27)
Bilirubin Total: <0.2 mg/dL (ref 0.0–1.2)
CALCIUM: 9.7 mg/dL (ref 8.7–10.3)
CO2: 21 mmol/L (ref 18–29)
CREATININE: 1.74 mg/dL — AB (ref 0.57–1.00)
Chloride: 103 mmol/L (ref 96–106)
GFR calc Af Amer: 33 — ABNORMAL LOW (ref >59)
GFR, EST NON AFRICAN AMERICAN: 28 — AB (ref >59)
GLOBULIN, TOTAL: 2.9 (ref 1.5–4.5)
GLUCOSE: 91 mg/dL (ref 65–99)
Potassium: 4.7 mmol/L (ref 3.5–5.2)
SODIUM: 143 mmol/L (ref 134–144)
Total Protein: 6.9 g/dL (ref 6.0–8.5)

## 2016-07-04 LAB — TSH: TSH: 4.43 u[IU]/mL (ref 0.450–4.500)

## 2016-07-08 ENCOUNTER — Ambulatory Visit: Payer: Medicare Other | Admitting: Pediatrics

## 2016-07-20 ENCOUNTER — Other Ambulatory Visit: Payer: Self-pay | Admitting: Pediatrics

## 2016-07-31 ENCOUNTER — Ambulatory Visit: Payer: Medicare Other | Admitting: Pediatrics

## 2016-08-08 ENCOUNTER — Encounter: Payer: Self-pay | Admitting: Pediatrics

## 2016-08-08 ENCOUNTER — Ambulatory Visit (INDEPENDENT_AMBULATORY_CARE_PROVIDER_SITE_OTHER): Payer: Medicare Other | Admitting: Pediatrics

## 2016-08-08 VITALS — BP 143/68 | HR 59 | Temp 97.3°F | Ht 70.0 in | Wt 119.0 lb

## 2016-08-08 DIAGNOSIS — M810 Age-related osteoporosis without current pathological fracture: Secondary | ICD-10-CM | POA: Diagnosis not present

## 2016-08-08 DIAGNOSIS — M545 Low back pain: Secondary | ICD-10-CM | POA: Diagnosis not present

## 2016-08-08 DIAGNOSIS — G4700 Insomnia, unspecified: Secondary | ICD-10-CM

## 2016-08-08 DIAGNOSIS — Z0289 Encounter for other administrative examinations: Secondary | ICD-10-CM

## 2016-08-08 DIAGNOSIS — Z720 Tobacco use: Secondary | ICD-10-CM

## 2016-08-08 DIAGNOSIS — G8929 Other chronic pain: Secondary | ICD-10-CM

## 2016-08-08 MED ORDER — SUVOREXANT 10 MG PO TABS: 10.0000 mg | ORAL_TABLET | Freq: Every evening | ORAL | 0 refills | Status: DC | PRN

## 2016-08-08 MED ORDER — HYDROCODONE-ACETAMINOPHEN 5-325 MG PO TABS: 1.0000 | ORAL_TABLET | Freq: Three times a day (TID) | ORAL | 0 refills | Status: DC | PRN

## 2016-08-08 NOTE — Patient Instructions (Addendum)
Stop xanax.  Take belsomra at night, 30 min before bed.  I am going to try to set up reclast infusion at the hospital. We will call you about this.

## 2016-08-08 NOTE — Progress Notes (Signed)
  Subjective:   Patient ID: Tiffany [name], female    DOB: [birth year], 76 y.o.   MRN: [id number] CC: Follow-up (4 wk rck) multiple med problems HPI: Tiffany [name] is a 76 y.o. female presenting for Follow-up (4 wk rck)  Here today with her grandaughter who is with her during the days Recently transferred care to [facility] Pt says she has been doing fine grandaughter says appetite remains low  Insomnia: Goes to bed at 11pm, wakes up every hr after 3am Very bothered with insomnia Tried melatonin after last appt Has been on ambien in the past, also doesn't think it helped Did not help  CKD: Followed by Dr. Florene Glen in [place] back pain: Worse with movement, takes 2-3 vicodin a day Helps get her up moving around and doing ADLs No constipation, normal stooling Has been on same dose for a long time  Anxiety: Taking xanax apprx once or twice a day Doesn't think it is helping Has been tried on other medications for anxiety in the past Doesn't feel anxious now she says  Tobacco use: smoking < 10 cig a day Working on decreasing  Relevant past medical, surgical, family and social history reviewed. Allergies and medications reviewed and updated. History  Smoking Status  . Current Every Day Smoker  . Packs/day: 0.25  . Years: 23.00  . Types: Cigarettes  . Start date: 07/14/1986  . Last attempt to quit: 09/18/2015  Smokeless Tobacco  . Never Used   ROS: Per HPI   Objective:    BP (!) 143/68   Pulse (!) 59   Temp 97.3 F (36.3 C) (Oral)   Ht '5\' 10"'$  (1.778 m)   Wt 119 lb (54 kg)   BMI 17.07 kg/m   Wt Readings from Last 3 Encounters:  08/08/16 119 lb (54 kg)  07/03/16 117 lb (53.1 kg)  06/24/16 167 lb (75.8 kg)    Gen: NAD, alert, thin, cooperative with exam, NCAT EYES: EOMI, no conjunctival injection, or no icterus ENT:  TMs pearly gray b/l, OP without erythema LYMPH: no cervical LAD CV: NRRR, normal S1/S2, no murmur, distal pulses 2+ b/l Resp: CTABL, no wheezes, normal  WOB Abd: +BS, soft, NTND.  Ext: No edema, warm Neuro: Alert and oriented  Assessment & Plan:  Tiffany [name] was seen today for follow-up multiple med problems  Diagnoses and all orders for this visit:  Insomnia, unspecified type Discussed sleep hygiene Stop xanax Trial of below -     Suvorexant (BELSOMRA) 10 MG TABS; Take 10 mg by mouth at bedtime as needed.  Osteoporosis, unspecified osteoporosis type, unspecified pathological fracture presence Recent DEXA, rec reclast Will try to get scheduled through [place] -     Phosphorus -     Magnesium  Tobacco abuse Ongoing Cont to encourage cessation, discussed cessation strategies  Chronic bilateral low back pain without sciatica Pain medication helps with ADLs She is able to get up and do more Will continue for now Needs to initial pain appt next visit -     HYDROcodone-acetaminophen (NORCO/VICODIN) 5-325 MG tablet; Take 1 tablet by mouth every 8 (eight) hours as needed for moderate pain.   Follow up plan: Return in about 4 weeks (around 09/05/2016). Assunta Found, MD [place]

## 2016-08-09 LAB — BMP8+EGFR
BUN/Creatinine Ratio: 17 (ref 12–28)
BUN: 28 mg/dL — AB (ref 8–27)
CALCIUM: 9.7 mg/dL (ref 8.7–10.3)
CHLORIDE: 104 mmol/L (ref 96–106)
CO2: 23 mmol/L (ref 18–29)
Creatinine, Ser: 1.61 mg/dL — ABNORMAL HIGH (ref 0.57–1.00)
GFR calc Af Amer: 36 mL/min/{1.73_m2} — ABNORMAL LOW (ref >59)
GFR calc non Af Amer: 31 mL/min/{1.73_m2} — ABNORMAL LOW (ref >59)
Glucose: 87 mg/dL (ref 65–99)
Potassium: 4.6 mmol/L (ref 3.5–5.2)
Sodium: 143 mmol/L (ref 134–144)

## 2016-08-09 LAB — PHOSPHORUS: Phosphorus: 3.7 mg/dL (ref 2.5–4.5)

## 2016-08-09 LAB — MAGNESIUM: Magnesium: 2.6 mg/dL — ABNORMAL HIGH (ref 1.6–2.3)

## 2016-09-09 ENCOUNTER — Ambulatory Visit (INDEPENDENT_AMBULATORY_CARE_PROVIDER_SITE_OTHER): Payer: Medicare Other | Admitting: Pediatrics

## 2016-09-09 ENCOUNTER — Encounter: Payer: Self-pay | Admitting: Pediatrics

## 2016-09-09 VITALS — BP 139/76 | HR 72 | Temp 97.5°F | Ht 70.0 in | Wt 120.4 lb

## 2016-09-09 DIAGNOSIS — D649 Anemia, unspecified: Secondary | ICD-10-CM | POA: Diagnosis not present

## 2016-09-09 DIAGNOSIS — Z0289 Encounter for other administrative examinations: Secondary | ICD-10-CM

## 2016-09-09 DIAGNOSIS — M545 Low back pain: Secondary | ICD-10-CM | POA: Diagnosis not present

## 2016-09-09 DIAGNOSIS — G8929 Other chronic pain: Secondary | ICD-10-CM

## 2016-09-09 DIAGNOSIS — F419 Anxiety disorder, unspecified: Secondary | ICD-10-CM

## 2016-09-09 DIAGNOSIS — G4700 Insomnia, unspecified: Secondary | ICD-10-CM

## 2016-09-09 MED ORDER — HYDROCODONE-ACETAMINOPHEN 5-325 MG PO TABS: 1.0000 | ORAL_TABLET | Freq: Two times a day (BID) | ORAL | 0 refills | Status: DC | PRN

## 2016-09-09 MED ORDER — SODIUM BICARBONATE 325 MG PO TABS: 650.0000 mg | ORAL_TABLET | Freq: Three times a day (TID) | ORAL | 3 refills | Status: DC

## 2016-09-09 MED ORDER — ALPRAZOLAM 0.25 MG PO TABS: 0.2500 mg | ORAL_TABLET | Freq: Two times a day (BID) | ORAL | 2 refills | Status: DC | PRN

## 2016-09-09 MED ORDER — FOLIC ACID 1 MG PO TABS: 1.0000 mg | ORAL_TABLET | Freq: Every morning | ORAL | 5 refills | Status: DC

## 2016-09-09 MED ORDER — SUVOREXANT 20 MG PO TABS: 20.0000 mg | ORAL_TABLET | Freq: Every day | ORAL | 2 refills | Status: DC

## 2016-09-09 MED ORDER — FERREX 150 150 MG PO CAPS: 150.0000 mg | ORAL_CAPSULE | Freq: Every day | ORAL | 5 refills | Status: DC

## 2016-09-09 NOTE — Progress Notes (Signed)
Subjective:   Patient ID: Tiffany [name], female    DOB: February 20, 1941, 76 y.o.   MRN: [id number] CC: Pain Management and Trouble sleeping  HPI: Tiffany [name] is a 76 y.o. female presenting for Pain Management and Trouble sleeping  Here today with granddaughter and GD's husband  Anxiety: taking one xanax a day, 0.5mg  sometimes breaking in half  Insomnia: 10mg  belsomra not helping Still wakes up 230am after falling asleep around 11pm  Sjogrens: on plaquenil, following with rheumatology  [facility] Controlled Substance Abuse database reviewed- Yes If yes- were their any concerning findings : no Depression screen [facility] 2/9 09/09/2016 08/08/2016 07/03/2016  Decreased Interest 0 0 0  Down, Depressed, Hopeless 0 0 0  PHQ - 2 Score 0 0 0    GAD 7 : Generalized Anxiety Score 09/09/2016  Nervous, Anxious, on Edge 0  Control/stop worrying 0  Worry too much - different things 1  Trouble relaxing 1  Restless 0  Easily annoyed or irritable 0  Afraid - awful might happen 0  Total GAD 7 Score 2    [employer] drug screen performed- Yes  SOAPP  0= never  1= seldom  2=sometimes  3= often  4= very often  How often do you have mood swings? 0 How often do you smoke a cigarette within an hour after waling up? 4 How often have you taken medication other than the way that it was prescribed?0 How often have you used illegal drugs in the past 5 years? 0 How often, in your lifetime, have you had legal problems or been arrested? 0  Score 4   Alcohol Audit - How often during the last year have found that you: 0-Never   1- Less than monthly   2- Monthly     3-Weekly     4-daily or almost daily  - found that you were not able to stop drinking once you started- 0 -failed to do what was normally expected of you because of drinking- 0 -needed a first drink in the morning- 0 -had a feeling of guilt or remorse after drinking- 0 -are/were unable to remember what happened the night before because of  your drinking- 0  0- NO   2- yes but not in last year  4- yes during last year -Have you or someone else been injured because of your drinking- 0 - Has anyone been concerned about your drinking or suggested you cut down- 0        TOTAL- 0   ( 0-7- alcohol education, 8-15- simple advice, 16-19 simple advice plus counseling, 20-40 referral for evaluation and treatment 0   Pain assessment: Cause of pain- arthritis, has kyphosis Pain location- mid and low back Pain on scale of 1-10- 8/10 at worst, at best never goes away, is a 3-4/10 Frequency- every day What increases pain- walking, sweeping makes it worse What makes pain Better- pain medication helps, resting Effects on ADL - pain medication helps get her up moving around some  Prior treatments tried and failed-  Current treatments- hydrocodone Morphine mg equivalent- 15  Pain management agreement reviewed and signed- Yes  Relevant past medical, surgical, family and social history reviewed. Allergies and medications reviewed and updated. History  Smoking Status  . Current Every Day Smoker  . Packs/day: 0.25  . Years: 23.00  . Types: Cigarettes  . Start date: 07/14/1986  . Last attempt to quit: 09/18/2015  Smokeless Tobacco  . Never Used   ROS: Per HPI  Objective:    BP 139/76   Pulse 72   Temp 97.5 F (36.4 C) (Oral)   Ht 5\' 10"  (1.778 m)   Wt 120 lb 6.4 oz (54.6 kg)   BMI 17.28 kg/m   Wt Readings from Last 3 Encounters:  09/09/16 120 lb 6.4 oz (54.6 kg)  08/08/16 119 lb (54 kg)  07/03/16 117 lb (53.1 kg)    Gen: NAD, alert, cooperative with exam, thin, NCAT EYES: EOMI, no conjunctival injection, or no icterus CV: NRRR, normal S1/S2, no murmur, distal pulses 2+ b/l Resp: CTABL, no wheezes, normal WOB Abd: +BS, soft, NTND. no guarding or organomegaly Ext: No edema, warm Neuro: Alert and oriented MSK: kyphosis present, no point tenderness over spine  Assessment & Plan:  Tamie was seen today for pain  management and trouble sleeping.  Diagnoses and all orders for this visit:  insomnia Discussed sleep hygiene Increase to 20mg  belsomra at night -     Suvorexant (BELSOMRA) 20 MG TABS; Take 20 mg by mouth at bedtime.  Anxiety Below prn, must be seen for refills Discussed danger of bein gon both xanax and pain medication Open to weaning off of xanax -     ALPRAZolam (XANAX) 0.25 MG tablet; Take 1 tablet (0.25 mg total) by mouth 2 (two) times daily as needed for anxiety. DO NOT FILL UNTIL 07/07/2016  Chronic bilateral low back pain without sciatica Signed pain agreement today Rx for below given Must be seen for more refills Helps pt with ADLs, improves quality of life Will continue for now -     HYDROcodone-acetaminophen (NORCO/VICODIN) 5-325 MG tablet; Take 1 tablet by mouth every 12 (twelve) hours as needed for moderate pain. -     HYDROcodone-acetaminophen (NORCO/VICODIN) 5-325 MG tablet; Take 1 tablet by mouth every 12 (twelve) hours as needed for moderate pain. -     HYDROcodone-acetaminophen (NORCO/VICODIN) 5-325 MG tablet; Take 1 tablet by mouth every 12 (twelve) hours as needed for moderate pain.  Insomnia, unspecified type -     Suvorexant (BELSOMRA) 20 MG TABS; Take 20 mg by mouth at bedtime.  Pain medication agreement signed -     Tox[employer] [facility] 13 (MW), Urine   Follow up plan: Return in about 3 months (around 12/09/2016). Assunta Found, MD [place]

## 2016-09-13 LAB — TOXASSURE SELECT 13 (MW), URINE

## 2016-10-08 ENCOUNTER — Other Ambulatory Visit: Payer: Self-pay | Admitting: Pediatrics

## 2016-10-15 ENCOUNTER — Encounter: Payer: Self-pay | Admitting: Cardiology

## 2016-10-15 NOTE — Progress Notes (Signed)
Cardiology Office Note  Date: 10/16/2016   ID: Tiffany [name], DOB December 23, 1940, MRN [id number]  PCP: Tiffany Maize, MD  Primary Cardiologist: Tiffany Lesches, MD   Chief Complaint  Patient presents with  . Coronary Artery Disease  . Cardiomyopathy    History of Present Illness: Tiffany [name] is a medically complex 76 y.o. female that I met back in 2016. She was more recently evaluated by Tiffany [name] in July 2017. I reviewed her interval records and updated the chart. She presents overdue for follow-up with her daughter. She does not report any angina symptoms. She is fairly sedentary, moves about in her home using a cane, also enjoys reading. She does have a feeling of fast heart rate sometimes when she lies down at nighttime but this goes away in a few minutes. She has had no dizziness or syncope. No orthopnea or leg swelling.  Most recent cardiac history includes cardiac catheterization in May 2017 which revealed widely patent RCA stent site with fairly minimal atherosclerosis otherwise. 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 Complicating matters is orthostatic hypotension which has limited medical therapy for cardiomyopathy to some degree.  I personally reviewed her ECG today which shows sinus arrhythmia with LVH and repolarization abnormalities.  Current cardiac regimen includes aspirin, Lipitor, and ProAmatine. She is not on beta blocker or ARB due to orthostasis history.  Past Medical History:  Diagnosis Date  . Anemia   . CAD (coronary artery disease)    DES to mid RCA 2006  . Cardiomyopathy ([place])   . Chronic lower back pain   . CKD (chronic kidney disease) stage 3, GFR 30-59 ml/min   . COPD (chronic obstructive pulmonary disease) ([place]) Dx'd 09/28/2015  . Depression   . Dysphagia   . History of blood transfusion 09/2015  . Hyperlipidemia   . Kidney stones   . Lymphoma ([place])   . Orthostatic hypotension   . PMB  (postmenopausal bleeding) 06/12/2015  . Pneumonia 09/28/2015  . Rheumatoid arthritis ([place])   . Sjogren's syndrome ([place])   . Syncope   . Systemic lupus erythematosus ([place])     Past Surgical History:  Procedure Laterality Date  . BREAST BIOPSY Left   . CARDIAC CATHETERIZATION N/A 10/06/2015   Procedure: Right/Left Heart Cath and Coronary Angiography;  Surgeon: Tiffany Man, MD;  Location: [place] [facility];  Service: Cardiovascular;  Laterality: N/A;  . CATARACT EXTRACTION W/ INTRAOCULAR LENS  IMPLANT, BILATERAL Bilateral   . CORONARY ANGIOPLASTY WITH STENT PLACEMENT  2006   DES to mid RCA   . DILATION AND CURETTAGE OF UTERUS    . EYE SURGERY    . LAPAROSCOPIC CHOLECYSTECTOMY    . LITHOTRIPSY  X 3  . LYMPH NODE DISSECTION Left ~ 2000   "neck"  . MASS EXCISION Left ~ 2000   Lymphoma  . RETINAL DETACHMENT SURGERY Right   . TONSILLECTOMY Left ~ 2000  . TUBAL LIGATION      Current Outpatient Prescriptions  Medication Sig Dispense Refill  . acetaminophen (TYLENOL) 325 MG tablet Take 2 tablets (650 mg total) by mouth every 6 (six) hours as needed for mild pain (or Fever >/= 101). 30 tablet 0  . albuterol (PROVENTIL) (2.5 MG/3ML) 0.083% nebulizer solution Take 3 mLs (2.5 mg total) by nebulization every 6 (six) hours as needed for wheezing or shortness of breath. 75 mL 12  . ALPRAZolam (XANAX) 0.25 MG tablet Take 1 tablet (  0.25 mg total) by mouth 2 (two) times daily as needed for anxiety. DO NOT FILL UNTIL 07/07/2016 60 tablet 2  . Artificial Tear Solution (SOOTHE XP OP) Apply 1 drop to eye 3 (three) times daily.     Marland Kitchen aspirin 81 MG tablet Take 81 mg by mouth daily.    Marland Kitchen atorvastatin (LIPITOR) 40 MG tablet TAKE 1 TABLET EVERY DAY 90 tablet 0  . Cholecalciferol (VITAMIN D) 400 UNITS capsule Take 400 Units by mouth every evening.     . clobetasol cream (TEMOVATE) 7.78 % Apply 1 application topically 2 (two) times daily.    . cycloSPORINE (RESTASIS) 0.05 % ophthalmic emulsion Place 1 drop  into both eyes 2 (two) times daily.    Marland Kitchen docusate sodium (COLACE) 100 MG capsule Take 100 mg by mouth 2 (two) times daily as needed for mild constipation.    Marland Kitchen FERREX 150 150 MG capsule Take 1 capsule (150 mg total) by mouth daily. 30 capsule 5  . folic acid (FOLVITE) 1 MG tablet Take 1 tablet (1 mg total) by mouth every morning. 30 tablet 5  . HYDROcodone-acetaminophen (NORCO/VICODIN) 5-325 MG tablet Take 1 tablet by mouth every 12 (twelve) hours as needed for moderate pain. 60 tablet 0  . HYDROcodone-acetaminophen (NORCO/VICODIN) 5-325 MG tablet Take 1 tablet by mouth every 12 (twelve) hours as needed for moderate pain. 60 tablet 0  . HYDROcodone-acetaminophen (NORCO/VICODIN) 5-325 MG tablet Take 1 tablet by mouth every 12 (twelve) hours as needed for moderate pain. 60 tablet 0  . hydroxychloroquine (PLAQUENIL) 200 MG tablet Take 200 mg by mouth daily. To be taken with food or milk    . midodrine (PROAMATINE) 10 MG tablet Take 2 tablets each morning and 1 tablet at bedtime, to help keep your blood pressure from dropping too much. 90 tablet 1  . Multiple Vitamins-Minerals (PRESERVISION/LUTEIN PO) Take 1 capsule by mouth daily.    . nitroGLYCERIN (NITROSTAT) 0.4 MG SL tablet Place 1 tablet (0.4 mg total) under the tongue every 5 (five) minutes as needed for chest pain.    Marland Kitchen omeprazole (PRILOSEC) 20 MG capsule Take 20 mg by mouth daily.     . raloxifene (EVISTA) 60 MG tablet TAKE 1 TABLET BY MOUTH DAILY 90 tablet 1  . senna-docusate (SENOKOT-S) 8.6-50 MG tablet Take 1 tablet by mouth 2 (two) times daily. For constipation.    . sodium bicarbonate 325 MG tablet Take 2 tablets (650 mg total) by mouth 3 (three) times daily. 180 tablet 3  . Suvorexant (BELSOMRA) 20 MG TABS Take 20 mg by mouth at bedtime. 30 tablet 2  . Travoprost, BAK Free, (TRAVATAN) 0.004 % SOLN ophthalmic solution Place 1 drop into both eyes at bedtime.     No current facility-administered medications for this visit.    Allergies:   Florinef [fludrocortisone acetate]; Iohexol; Sulfonamide derivatives; and Tape   Social History: The patient  reports that she has been smoking Cigarettes.  She started smoking about 30 years ago. She has a 5.75 pack-year smoking history. She has never used smokeless tobacco. She reports that she does not drink alcohol or use drugs.   ROS:  Please see the history of present illness. Otherwise, complete review of systems is positive for arthritic pains.  All other systems are reviewed and negative.   Physical Exam: VS:  BP 130/74   Pulse 92   Ht '5\' 10"'  (1.778 m)   Wt 117 lb (53.1 kg)   SpO2 96%   BMI 16.79  kg/m , BMI Body mass index is 16.79 kg/m.  Wt Readings from Last 3 Encounters:  10/16/16 117 lb (53.1 kg)  09/09/16 120 lb 6.4 oz (54.6 kg)  08/08/16 119 lb (54 kg)    General: Thin elderly woman, appears comfortable at rest. HEENT: Conjunctiva and lids normal, oropharynx clear. Neck: Supple, no elevated JVP or carotid bruits, no thyromegaly. Lungs: Clear to auscultation, nonlabored breathing at rest. Cardiac: Regular rate and rhythm, no S3, soft systolic murmur, no pericardial rub. Abdomen: Soft, nontender, bowel sounds present. Extremities: No pitting edema, distal pulses 2+. Skin: Warm and dry. Musculoskeletal: Mild kyphosis. Neuropsychiatric: Alert and oriented x3, affect grossly appropriate.  ECG: I personally reviewed the tracing from 10/28/2015 which showed sinus tachycardia with possible old inferolateral infarct pattern.  Recent Labwork: 07/03/2016: ALT 11; AST 15; Hemoglobin 11.1; Platelets 195; TSH 4.430 08/08/2016: BUN 28; Creatinine, Ser 1.61; Magnesium 2.6; Potassium 4.6; Sodium 143   Other Studies Reviewed Today:  Cardiac catheterization 10/06/2015:  Mild focal disease in the RCA with widely patent stent.  Otherwise minimal CAD noted.  The patient appears to be somewhat dehydrated with low filling pressures.  Normal cardiac output  Echocardiogram  09/16/2015: Cardiomyopathy Study Conclusions  - Left ventricle: The cavity size was mildly dilated. Wall   thickness was normal. Systolic function was moderately to   severely reduced. The estimated ejection fraction was in the   range of 30% to 35%. Diffuse hypokinesis. Doppler parameters are   consistent with abnormal left ventricular relaxation (grade 1   diastolic dysfunction). - Mitral valve: Calcified annulus. There was moderate   regurgitation. - Left atrium: The atrium was mildly dilated.  Impressions:  - Moderate to severe global reduction in LV function; grade 1   diastolic dysfunction; mild LVE; mild LAE; moderate MR.  Assessment and Plan:  1. CAD status post DES to the mid RCA in 2006. Cardiac catheterization from May 2017 showed widely patent stent site and otherwise minimal disease. Continue aspirin and statin. No active angina symptoms.  2. History of nonischemic cardiomyopathy with LVEF 30-35% by echocardiogram from May 2017. Medical therapy is limited by orthostatic hypotension. Plan to follow-up echocardiogram for reassessment.  3. Orthostatic hypotension, currently stable on Proamatine. No syncope recently.  4. Hyperlipidemia, on Lipitor.  Current medicines were reviewed with the patient today.   Orders Placed This Encounter  Procedures  . EKG 12-Lead  . ECHOCARDIOGRAM COMPLETE    Disposition: Follow-up in 6 months.  Signed, Satira Sark, MD, [facility] 10/16/2016 2:15 PM    [facility] [address], [place], [place] [address] Phone: [phone]; Fax: [phone]

## 2016-10-16 ENCOUNTER — Encounter: Payer: Self-pay | Admitting: Cardiology

## 2016-10-16 ENCOUNTER — Ambulatory Visit (INDEPENDENT_AMBULATORY_CARE_PROVIDER_SITE_OTHER): Payer: Medicare Other | Admitting: Cardiology

## 2016-10-16 VITALS — BP 130/74 | HR 92 | Ht 70.0 in | Wt 117.0 lb

## 2016-10-16 DIAGNOSIS — I428 Other cardiomyopathies: Secondary | ICD-10-CM

## 2016-10-16 DIAGNOSIS — E782 Mixed hyperlipidemia: Secondary | ICD-10-CM

## 2016-10-16 DIAGNOSIS — I2510 Atherosclerotic heart disease of native coronary artery without angina pectoris: Secondary | ICD-10-CM | POA: Diagnosis not present

## 2016-10-16 DIAGNOSIS — I951 Orthostatic hypotension: Secondary | ICD-10-CM | POA: Diagnosis not present

## 2016-10-16 NOTE — Patient Instructions (Signed)

## 2016-10-24 ENCOUNTER — Ambulatory Visit (HOSPITAL_COMMUNITY)
Admission: RE | Admit: 2016-10-24 | Discharge: 2016-10-24 | Disposition: A | Discharge status: Home / Self Care | Payer: Medicare Other | Source: Ambulatory Visit | Attending: Cardiology | Admitting: Cardiology

## 2016-10-24 DIAGNOSIS — I081 Rheumatic disorders of both mitral and tricuspid valves: Secondary | ICD-10-CM | POA: Diagnosis not present

## 2016-10-24 DIAGNOSIS — I428 Other cardiomyopathies: Secondary | ICD-10-CM | POA: Diagnosis present

## 2016-10-24 LAB — ECHOCARDIOGRAM COMPLETE
AVLVOTPG: 4 mmHg
CHL CUP DOP CALC LVOT VTI: 25.8 cm
CHL CUP MV DEC (S): 528
CHL CUP STROKE VOLUME: 41 mL
CHL CUP TV REG PEAK VELOCITY: 277 cm/s
E/e' ratio: 14.94
EWDT: 528 ms
FS: 17 % — AB (ref 28–44)
IV/PV OW: 1.08
LA ID, A-P, ES: 35 mm
LA diam end sys: 35 mm
LA diam index: 2.19 cm/m2
LA vol index: 34.7 mL/m2
LA vol: 55.5 mL
LAVOLA4C: 57 mL
LDCA: 2.84 cm2
LV E/e' medial: 14.94
LV E/e'average: 14.94
LV PW d: 9.29 mm — AB (ref 0.6–1.1)
LV TDI E'MEDIAL: 3.92
LV dias vol index: 53 mL/m2
LV e' LATERAL: 4.68 cm/s
LV sys vol index: 27 mL/m2
LV sys vol: 44 mL — AB
LVDIAVOL: 85 mL (ref 46–106)
LVOT SV: 73 mL
LVOTD: 19 mm
LVOTPV: 106 cm/s
MV pk E vel: 69.9 m/s
MVPKAVEL: 81.9 m/s
RV LATERAL S' VELOCITY: 13.4 cm/s
RV sys press: 34 mmHg
Simpson's disk: 48
TAPSE: 22.3 mm
TDI e' lateral: 4.68
TR max vel: 277 cm/s

## 2016-10-24 NOTE — Progress Notes (Signed)
*  PRELIMINARY RESULTS* Echocardiogram 2D Echocardiogram has been performed.  Tiffany [name] 10/24/2016, 1:56 PM

## 2016-10-25 ENCOUNTER — Other Ambulatory Visit: Payer: Self-pay | Admitting: Pediatrics

## 2016-12-05 ENCOUNTER — Encounter: Payer: Self-pay | Admitting: Pediatrics

## 2016-12-05 ENCOUNTER — Ambulatory Visit (INDEPENDENT_AMBULATORY_CARE_PROVIDER_SITE_OTHER): Payer: Medicare Other | Admitting: Pediatrics

## 2016-12-05 VITALS — BP 137/81 | HR 71 | Temp 97.5°F | Ht 70.0 in | Wt 121.4 lb

## 2016-12-05 DIAGNOSIS — R5383 Other fatigue: Secondary | ICD-10-CM

## 2016-12-05 DIAGNOSIS — F419 Anxiety disorder, unspecified: Secondary | ICD-10-CM | POA: Diagnosis not present

## 2016-12-05 DIAGNOSIS — N189 Chronic kidney disease, unspecified: Secondary | ICD-10-CM

## 2016-12-05 DIAGNOSIS — F39 Unspecified mood [affective] disorder: Secondary | ICD-10-CM

## 2016-12-05 DIAGNOSIS — M545 Low back pain, unspecified: Secondary | ICD-10-CM

## 2016-12-05 DIAGNOSIS — G8929 Other chronic pain: Secondary | ICD-10-CM | POA: Diagnosis not present

## 2016-12-05 MED ORDER — HYDROCODONE-ACETAMINOPHEN 5-325 MG PO TABS: 1.0000 | ORAL_TABLET | Freq: Two times a day (BID) | ORAL | 0 refills | Status: DC | PRN

## 2016-12-05 MED ORDER — MIRTAZAPINE 7.5 MG PO TABS: 7.5000 mg | ORAL_TABLET | Freq: Every day | ORAL | 2 refills | Status: DC

## 2016-12-05 MED ORDER — DIAZEPAM 2 MG PO TABS: ORAL_TABLET | ORAL | 2 refills | Status: DC

## 2016-12-05 NOTE — Progress Notes (Signed)
  Subjective:   Patient ID: Tiffany [name], female    DOB: [birth year], 76 y.o.   MRN: [id number] CC: Follow-up; Fatigue; and Trouble sleeping  HPI: Tiffany [name] is a 76 y.o. female presenting for Follow-up; Fatigue; and Trouble sleeping  Here today with her daughter  Eating 2 meals every day Appetite has been about the same, down for some time  Insomnia: Not improved on belsomra  Indication for chronic opioid: back pain, bothered most  Medication and dose: hydrocodone '5mg'$  # pills per month: #60 Last UDS date: 08/2016 Pain contract signed (Y/N): Y Date narcotic database last reviewed (include red flags): reviewed, appropriate  Mood has been down some days  Relevant past medical, surgical, family and social history reviewed. Allergies and medications reviewed and updated. History  Smoking Status  . Current Every Day Smoker  . Packs/day: 0.25  . Years: 23.00  . Types: Cigarettes  . Start date: 07/14/1986  Smokeless Tobacco  . Never Used   ROS: Per HPI   Objective:    BP 137/81   Pulse 71   Temp (!) 97.5 F (36.4 C) (Oral)   Ht '5\' 10"'$  (1.778 m)   Wt 121 lb 6.4 oz (55.1 kg)   BMI 17.42 kg/m   Wt Readings from Last 3 Encounters:  12/05/16 121 lb 6.4 oz (55.1 kg)  10/16/16 117 lb (53.1 kg)  09/09/16 120 lb 6.4 oz (54.6 kg)    Gen: NAD, alert, thin, cooperative with exam, NCAT EYES: EOMI, no conjunctival injection, or no icterus ENT:  TMs pearly gray b/l, OP without erythema LYMPH: no cervical LAD CV: NRRR, normal S1/S2, no murmur, distal pulses 2+ b/l Resp: CTABL, no wheezes, normal WOB Abd: +BS, soft, NTND. no guarding or organomegaly Ext: No edema, warm Neuro: Alert and oriented  Assessment & Plan:  Tiffany [name] was seen today for follow-up, fatigue and trouble sleeping.  Diagnoses and all orders for this visit:  Chronic kidney disease, unspecified CKD stage Stable, follows with nephrology  Other fatigue Repeat blood work Eat 3-4 small meals/snacks a day -      CBC with Differential/Platelet -     CMP14+EGFR  Mood disorder ([place]) Trial below for mood, sleep, appetite -     mirtazapine (REMERON) 7.5 MG tablet; Take 1 tablet (7.5 mg total) by mouth at bedtime.  Anxiety Stop xanax Start diazepam as below -     diazepam (VALIUM) 2 MG tablet; One tab in the morning, 1-2 tabs at night  Chronic bilateral low back pain without sciatica Stable symptoms, improved ability to do ADLs with below Has been on for years, will cont '5mg'$  of below, BID prn Discussed risk of being on multiple medications that can cause fatigue, don't take hydrocodone same time as valium -     HYDROcodone-acetaminophen (NORCO/VICODIN) 5-325 MG tablet; Take 1 tablet by mouth every 12 (twelve) hours as needed for moderate pain. -     HYDROcodone-acetaminophen (NORCO/VICODIN) 5-325 MG tablet; Take 1 tablet by mouth every 12 (twelve) hours as needed for moderate pain. -     HYDROcodone-acetaminophen (NORCO/VICODIN) 5-325 MG tablet; Take 1 tablet by mouth every 12 (twelve) hours as needed for moderate pain.   Follow up plan: Return in about 3 months (around 03/07/2017) for med f/u. Assunta Found, MD [place]

## 2016-12-06 LAB — CBC WITH DIFFERENTIAL/PLATELET
BASOS: 0 %
Basophils Absolute: 0 10*3/uL (ref 0.0–0.2)
EOS (ABSOLUTE): 0.3 10*3/uL (ref 0.0–0.4)
EOS: 4 %
HEMATOCRIT: 35.6 % (ref 34.0–46.6)
Hemoglobin: 11.6 g/dL (ref 11.1–15.9)
IMMATURE GRANS (ABS): 0 10*3/uL (ref 0.0–0.1)
Immature Granulocytes: 0 %
LYMPHS: 27 %
Lymphocytes Absolute: 1.7 10*3/uL (ref 0.7–3.1)
MCH: 30.7 pg (ref 26.6–33.0)
MCHC: 32.6 g/dL (ref 31.5–35.7)
MCV: 94 fL (ref 79–97)
MONOS ABS: 0.7 10*3/uL (ref 0.1–0.9)
Monocytes: 11 %
NEUTROS ABS: 3.7 10*3/uL (ref 1.4–7.0)
Neutrophils: 58 %
PLATELETS: 168 10*3/uL (ref 150–379)
RBC: 3.78 x10E6/uL (ref 3.77–5.28)
RDW: 14.5 % (ref 12.3–15.4)
WBC: 6.4 10*3/uL (ref 3.4–10.8)

## 2016-12-06 LAB — CMP14+EGFR
A/G RATIO: 1.4 (ref 1.2–2.2)
ALBUMIN: 4.2 g/dL (ref 3.5–4.8)
ALT: 11 IU/L (ref 0–32)
AST: 18 IU/L (ref 0–40)
Alkaline Phosphatase: 61 IU/L (ref 39–117)
BILIRUBIN TOTAL: 0.2 mg/dL (ref 0.0–1.2)
BUN / CREAT RATIO: 16 (ref 12–28)
BUN: 27 mg/dL (ref 8–27)
CO2: 25 mmol/L (ref 20–29)
Calcium: 9.6 mg/dL (ref 8.7–10.3)
Chloride: 101 mmol/L (ref 96–106)
Creatinine, Ser: 1.74 mg/dL — ABNORMAL HIGH (ref 0.57–1.00)
GFR calc Af Amer: 33 mL/min/{1.73_m2} — ABNORMAL LOW (ref >59)
GFR, EST NON AFRICAN AMERICAN: 28 mL/min/{1.73_m2} — AB (ref >59)
GLOBULIN, TOTAL: 3 g/dL (ref 1.5–4.5)
Glucose: 86 mg/dL (ref 65–99)
POTASSIUM: 4.4 mmol/L (ref 3.5–5.2)
SODIUM: 141 mmol/L (ref 134–144)
TOTAL PROTEIN: 7.2 g/dL (ref 6.0–8.5)

## 2017-01-10 ENCOUNTER — Other Ambulatory Visit: Payer: Self-pay | Admitting: Pediatrics

## 2017-01-14 ENCOUNTER — Other Ambulatory Visit: Payer: Self-pay | Admitting: Pediatrics

## 2017-01-17 ENCOUNTER — Other Ambulatory Visit: Payer: Self-pay | Admitting: Pediatrics

## 2017-01-20 ENCOUNTER — Other Ambulatory Visit: Payer: Self-pay | Admitting: Pediatrics

## 2017-01-22 ENCOUNTER — Other Ambulatory Visit: Payer: Self-pay | Admitting: Pediatrics

## 2017-01-30 ENCOUNTER — Ambulatory Visit (INDEPENDENT_AMBULATORY_CARE_PROVIDER_SITE_OTHER): Payer: Medicare Other | Admitting: Pediatrics

## 2017-01-30 VITALS — BP 115/62 | HR 88 | Temp 98.5°F | Ht 70.0 in | Wt 126.0 lb

## 2017-01-30 DIAGNOSIS — S60922A Unspecified superficial injury of left hand, initial encounter: Secondary | ICD-10-CM

## 2017-01-30 DIAGNOSIS — K130 Diseases of lips: Secondary | ICD-10-CM

## 2017-01-30 DIAGNOSIS — T148XXA Other injury of unspecified body region, initial encounter: Secondary | ICD-10-CM

## 2017-01-30 DIAGNOSIS — B001 Herpesviral vesicular dermatitis: Secondary | ICD-10-CM | POA: Diagnosis not present

## 2017-01-30 MED ORDER — VALACYCLOVIR HCL 1 G PO TABS: 1000.0000 mg | ORAL_TABLET | Freq: Two times a day (BID) | ORAL | 0 refills | Status: AC

## 2017-01-30 MED ORDER — KETOCONAZOLE 2 % EX CREA: 1.0000 "application " | TOPICAL_CREAM | Freq: Every day | CUTANEOUS | 0 refills | Status: AC

## 2017-01-30 MED ORDER — MUPIROCIN 2 % EX OINT: TOPICAL_OINTMENT | CUTANEOUS | 0 refills | Status: DC

## 2017-01-30 NOTE — Progress Notes (Signed)
  Subjective:   Patient ID: Tiffany [name], female    DOB: 1941/03/21, 77 y.o.   MRN: [id number] CC: sore on mouth  HPI: Tiffany [name] is a 76 y.o. female presenting for sore on mouth here today with her daughter  Started within past two days Side of her R outh Hurts when she opens her mouth wide hasnt been able to remove her teeth past two days Happened in the past, was given topical medicine, improved  No fevers Normal appetite but trouble eating due to not able to open mouth wide No abd pain No other rash Otherwise feeling well  Tripped in the yard and scraped L dorsum of hand on stick  Relevant past medical, surgical, family and social history reviewed. Allergies and medications reviewed and updated. History  Smoking Status  . Current Every Day Smoker  . Packs/day: 0.25  . Years: 23.00  . Types: Cigarettes  . Start date: 07/14/1986  Smokeless Tobacco  . Never Used   ROS: Per HPI   Objective:    BP 115/62   Pulse 88   Temp 98.5 F (36.9 C) (Oral)   Ht 5\' 10"  (1.778 m)   Wt 126 lb (57.2 kg)   BMI 18.08 kg/m   Wt Readings from Last 3 Encounters:  01/30/17 126 lb (57.2 kg)  12/05/16 121 lb 6.4 oz (55.1 kg)  10/16/16 117 lb (53.1 kg)    Gen: NAD, alert, cooperative with exam, NCAT EYES: EOMI, no conjunctival injection, or no icterus LYMPH: no cervical LAD CV: NRRR, normal S1/S2 Resp: CTABL, no wheezes, normal WOB Ext: No edema, warm Neuro: Alert and oriented MSK: normal muscle bulk Skin: red crusted ulceration R corner of mouth Dorsum of L hand with superficial skin tear apprx 3 cm, no redness, no induration  Assessment & Plan:  Tiffany [name] was seen today for sore on mouth.  Diagnoses and all orders for this visit:  Angular cheilitis Herpes labialis Treat with below, renally dosed -     valACYclovir (VALTREX) 1000 MG tablet; Take 1 tablet (1,000 mg total) by mouth 2 (two) times daily. -     ketoconazole (NIZORAL) 2 % cream; Apply 1 application topically  daily. Use on corner of mouth  Abrasion -     mupirocin ointment (BACTROBAN) 2 %; Use twice a day on hand  Follow up plan: Return if symptoms worsen or fail to improve. Assunta Found, MD [place]

## 2017-01-31 ENCOUNTER — Ambulatory Visit: Payer: Medicare Other | Admitting: Pediatrics

## 2017-02-02 ENCOUNTER — Other Ambulatory Visit: Payer: Self-pay | Admitting: Pediatrics

## 2017-02-02 DIAGNOSIS — D649 Anemia, unspecified: Secondary | ICD-10-CM

## 2017-02-13 ENCOUNTER — Telehealth: Payer: Self-pay | Admitting: Internal Medicine

## 2017-02-13 NOTE — Telephone Encounter (Signed)
Letter mailed to pt.  

## 2017-02-13 NOTE — Telephone Encounter (Signed)
Recall for tcs °

## 2017-02-21 ENCOUNTER — Ambulatory Visit: Payer: Medicare Other | Admitting: Pediatrics

## 2017-03-04 ENCOUNTER — Encounter: Payer: Self-pay | Admitting: Pediatrics

## 2017-03-04 ENCOUNTER — Ambulatory Visit (INDEPENDENT_AMBULATORY_CARE_PROVIDER_SITE_OTHER): Payer: Medicare Other | Admitting: Pediatrics

## 2017-03-04 VITALS — BP 115/74 | HR 73 | Temp 97.2°F | Ht 70.0 in | Wt 126.0 lb

## 2017-03-04 DIAGNOSIS — F39 Unspecified mood [affective] disorder: Secondary | ICD-10-CM

## 2017-03-04 DIAGNOSIS — E785 Hyperlipidemia, unspecified: Secondary | ICD-10-CM | POA: Diagnosis not present

## 2017-03-04 DIAGNOSIS — M545 Low back pain, unspecified: Secondary | ICD-10-CM

## 2017-03-04 DIAGNOSIS — Z23 Encounter for immunization: Secondary | ICD-10-CM

## 2017-03-04 DIAGNOSIS — Z720 Tobacco use: Secondary | ICD-10-CM | POA: Diagnosis not present

## 2017-03-04 DIAGNOSIS — F419 Anxiety disorder, unspecified: Secondary | ICD-10-CM

## 2017-03-04 DIAGNOSIS — G8929 Other chronic pain: Secondary | ICD-10-CM

## 2017-03-04 MED ORDER — ATORVASTATIN CALCIUM 40 MG PO TABS: 40.0000 mg | ORAL_TABLET | Freq: Every day | ORAL | 1 refills | Status: AC

## 2017-03-04 MED ORDER — HYDROCODONE-ACETAMINOPHEN 5-325 MG PO TABS: 1.0000 | ORAL_TABLET | Freq: Two times a day (BID) | ORAL | 0 refills | Status: DC | PRN

## 2017-03-04 MED ORDER — DIAZEPAM 2 MG PO TABS: ORAL_TABLET | ORAL | 2 refills | Status: DC

## 2017-03-04 MED ORDER — SODIUM BICARBONATE 325 MG PO TABS: 650.0000 mg | ORAL_TABLET | Freq: Three times a day (TID) | ORAL | 3 refills | Status: AC

## 2017-03-04 MED ORDER — MIRTAZAPINE 15 MG PO TABS: 15.0000 mg | ORAL_TABLET | Freq: Every day | ORAL | 1 refills | Status: AC

## 2017-03-04 NOTE — Progress Notes (Signed)
Subjective:   Patient ID: Tiffany [name], female    DOB: [birth year], 76 y.o.   MRN: [id number] CC: f/u chronic med problems  HPI: Tiffany [name] is a 76 y.o. female presenting for f/u pain  Here today with daughter and granddaughter  Indication for chronic opioid: arthritis in back, chronic kidney disease Medication and dose: hydrocodone 5mg /325mg , takes twice a day # pills per month: #60 Last UDS date: 08/2016  Pain contract signed (Y/N): yes Date narcotic database last reviewed (include red flags): today, no red flags Pain at worst about a 6/10, never goes away but improves with medication  Insomnia: valium helps her sleep about 4 hrs Gets to bed around 10p-12a  Sjogren's: on plaquenil Has appt next month Following with rheum  CKD: following with Dr Florene Glen  Osteoporosis: has been on evista for years Now GFR around 30   Decreased appetite: wt has been stable Eating three times a day Daughter says not always eating full meal  Relevant past medical, surgical, family and social history reviewed. Allergies and medications reviewed and updated. History  Smoking Status  . Current Every Day Smoker  . Packs/day: 0.25  . Years: 23.00  . Types: Cigarettes  . Start date: 07/14/1986  Smokeless Tobacco  . Never Used   ROS: Per HPI   Objective:    BP 115/74   Pulse 73   Temp (!) 97.2 F (36.2 C) (Oral)   Ht 5\' 10"  (1.778 m)   Wt 126 lb (57.2 kg)   BMI 18.08 kg/m   Wt Readings from Last 3 Encounters:  03/04/17 126 lb (57.2 kg)  01/30/17 126 lb (57.2 kg)  12/05/16 121 lb 6.4 oz (55.1 kg)    Gen: NAD, alert, thin, cooperative with exam, NCAT EYES: EOMI, no conjunctival injection, or no icterus ENT:   OP without erythema LYMPH: no cervical LAD CV: NRRR, normal S1/S2, no murmur, distal pulses 2+ b/l Resp: CTABL, no wheezes, normal WOB Ext: No edema, warm Neuro: Alert and oriented  Assessment & Plan:  Diagnoses and all orders for this visit:  Hyperlipidemia,  unspecified hyperlipidemia type Stable, cont below -     atorvastatin (LIPITOR) 40 MG tablet; Take 1 tablet (40 mg total) by mouth daily.  Anxiety Stable, cont below -     diazepam (VALIUM) 2 MG tablet; One tab in the morning, 1-2 tabs at night  Chronic bilateral low back pain without sciatica Taking pain medication improves quality of life, helps keep her active, moving Will cont below 3 Rx of below given, #60 tabs -     HYDROcodone-acetaminophen (NORCO/VICODIN) 5-325 MG tablet; Take 1 tablet by mouth every 12 (twelve) hours as needed for moderate pain. -     HYDROcodone-acetaminophen (NORCO/VICODIN) 5-325 MG tablet; Take 1 tablet by mouth every 12 (twelve) hours as needed for moderate pain. -     HYDROcodone-acetaminophen (NORCO/VICODIN) 5-325 MG tablet; Take 1 tablet by mouth every 12 (twelve) hours as needed for moderate pain.  Mood disorder (HCC) Increase to 15mg , may help with appetite and insomnia some too -     mirtazapine (REMERON) 15 MG tablet; Take 1 tablet (15 mg total) by mouth at  bedtime.  Tobacco use Quit smoking 2 weeks ago  Other orders -     sodium bicarbonate 325 MG tablet; Take 2 tablets (650 mg total) by mouth 3 (three) times daily. -     Pneumococcal conjugate vaccine 13-valent  Osteoporosis GFR now around 30, will d/c bisphosphonate Refer to  endo for further management  Sjogrens On plaquenil, gets eye exams regularly, due next month per pt Following with rheum  Follow up plan: Return in about 3 months (around 06/04/2017). Assunta Found, MD [place]

## 2017-03-04 NOTE — Patient Instructions (Signed)
Ensure 2-3 times a day  Increase mirtazepine to 15mg  at night before bed

## 2017-03-07 ENCOUNTER — Telehealth: Payer: Self-pay | Admitting: Pediatrics

## 2017-03-07 MED ORDER — NYSTATIN 100000 UNIT/ML MT SUSP
5.0000 mL | Freq: Four times a day (QID) | OROMUCOSAL | 0 refills | Status: DC
Start: 2017-03-07 — End: 2017-05-31

## 2017-03-07 NOTE — Telephone Encounter (Signed)
Nystatin rx sent to pharmacy 

## 2017-03-07 NOTE — Telephone Encounter (Signed)
;  atient aware that rx was called in

## 2017-03-10 ENCOUNTER — Ambulatory Visit: Payer: Medicare Other | Admitting: Pediatrics

## 2017-03-10 ENCOUNTER — Other Ambulatory Visit: Payer: Self-pay | Admitting: Pediatrics

## 2017-03-10 DIAGNOSIS — D649 Anemia, unspecified: Secondary | ICD-10-CM

## 2017-03-12 ENCOUNTER — Other Ambulatory Visit: Payer: Self-pay | Admitting: Pediatrics

## 2017-04-04 ENCOUNTER — Other Ambulatory Visit: Payer: Self-pay | Admitting: Pediatrics

## 2017-04-08 ENCOUNTER — Other Ambulatory Visit: Payer: Self-pay | Admitting: Pediatrics

## 2017-04-23 ENCOUNTER — Other Ambulatory Visit: Payer: Self-pay | Admitting: Physician Assistant

## 2017-04-23 ENCOUNTER — Other Ambulatory Visit: Payer: Self-pay | Admitting: Pediatrics

## 2017-05-07 ENCOUNTER — Telehealth: Payer: Self-pay | Admitting: Pediatrics

## 2017-05-14 NOTE — Telephone Encounter (Signed)
Pharmacy aware

## 2017-05-14 NOTE — Telephone Encounter (Signed)
Pharmacy states that they have to make sure Dr. Evette Doffing is aware that patent is on diazepam and norco. Both are prescribed by Dr. Evette Doffing but they need a verbal that provider is aware to put on file.

## 2017-05-14 NOTE — Telephone Encounter (Signed)
Thanks, yes aware, decreasing both as able.

## 2017-05-29 NOTE — Progress Notes (Unsigned)
Cardiology Office Note  Date: 05/29/2017   ID: Tiffany [name], DOB [birth year], MRN [id number]  PCP: Eustaquio Maize, MD  Primary Cardiologist: Rozann Lesches, MD   No chief complaint on file.   History of Present Illness: Tiffany [name] is a medically complex 77 y.o. female last seen in June 2018.  Most recent cardiac history includes cardiac catheterization in May 2017 which revealed widely patent RCA stent site with fairly minimal atherosclerosis otherwise. She has a nonischemic cardiomyopathy with LVEF improved to the range of 40-45% by follow-up echocardiogram in June 2018. Moderate mitral regurgitation also noted.  Past Medical History:  Diagnosis Date  . Anemia   . CAD (coronary artery disease)    DES to mid RCA 2006  . Cardiomyopathy ([place])   . Chronic lower back pain   . CKD (chronic kidney disease) stage 3, GFR 30-59 ml/min (HCC)   . COPD (chronic obstructive pulmonary disease) ([place]) Dx'd 09/28/2015  . Depression   . Dysphagia   . History of blood transfusion 09/2015  . Hyperlipidemia   . Kidney stones   . Lymphoma ([place])   . Orthostatic hypotension   . PMB (postmenopausal bleeding) 06/12/2015  . Pneumonia 09/28/2015  . Rheumatoid arthritis ([place])   . Sjogren's syndrome ([place])   . Syncope   . Systemic lupus erythematosus ([place])     Past Surgical History:  Procedure Laterality Date  . BREAST BIOPSY Left   . CARDIAC CATHETERIZATION N/A 10/06/2015   Procedure: Right/Left Heart Cath and Coronary Angiography;  Surgeon: Leonie Man, MD;  Location: [facility];  Service: Cardiovascular;  Laterality: N/A;  . CATARACT EXTRACTION W/ INTRAOCULAR LENS  IMPLANT, BILATERAL Bilateral   . CORONARY ANGIOPLASTY WITH STENT PLACEMENT  2006   DES to mid RCA   . DILATION AND CURETTAGE OF UTERUS    . EYE SURGERY    . LAPAROSCOPIC CHOLECYSTECTOMY    . LITHOTRIPSY  X 3  . LYMPH NODE DISSECTION Left ~ 2000   "neck"  . MASS EXCISION Left ~ 2000   Lymphoma  . RETINAL  DETACHMENT SURGERY Right   . TONSILLECTOMY Left ~ 2000  . TUBAL LIGATION      Current Outpatient Medications  Medication Sig Dispense Refill  . albuterol (PROVENTIL) (2.5 MG/3ML) 0.083% nebulizer solution Take 3 mLs (2.5 mg total) by nebulization every 6 (six) hours as needed for wheezing or shortness of breath. 75 mL 12  . Artificial Tear Solution (SOOTHE XP OP) Apply 1 drop to eye 3 (three) times daily.     Marland Kitchen aspirin 81 MG tablet Take 81 mg by mouth daily.    Marland Kitchen atorvastatin (LIPITOR) 40 MG tablet Take 1 tablet (40 mg total) by mouth daily. 90 tablet 1  . Cholecalciferol (VITAMIN D) 400 UNITS capsule Take 400 Units by mouth every evening.     . clobetasol cream (TEMOVATE) 7.90 % Apply 1 application topically 2 (two) times daily.    . cycloSPORINE (RESTASIS) 0.05 % ophthalmic emulsion Place 1 drop into both eyes 2 (two) times daily.    . diazepam (VALIUM) 2 MG tablet One tab in the morning, 1-2 tabs at night 90 tablet 2  . docusate sodium (COLACE) 100 MG capsule Take 100 mg by mouth 2 (two) times daily as needed for mild constipation.    Marland Kitchen FERREX 150 150 MG capsule TAKE 1 CAPSULE (150 MG TOTAL) BY MOUTH DAILY. 30 capsule 2  . FERREX 150 150 MG capsule TAKE 1 CAPSULE (  150 MG TOTAL) BY MOUTH DAILY. 30 capsule 2  . folic acid (FOLVITE) 1 MG tablet TAKE 1 TABLET (1 MG TOTAL) BY MOUTH EVERY MORNING. 90 tablet 1  . HYDROcodone-acetaminophen (NORCO/VICODIN) 5-325 MG tablet Take 1 tablet by mouth every 12 (twelve) hours as needed for moderate pain. 60 tablet 0  . HYDROcodone-acetaminophen (NORCO/VICODIN) 5-325 MG tablet Take 1 tablet by mouth every 12 (twelve) hours as needed for moderate pain. 60 tablet 0  . HYDROcodone-acetaminophen (NORCO/VICODIN) 5-325 MG tablet Take 1 tablet by mouth every 12 (twelve) hours as needed for moderate pain. 60 tablet 0  . hydroxychloroquine (PLAQUENIL) 200 MG tablet TAKE 2 TABLET WITH FOOD OR MILK ONCE A DAY ORALLY 30 DAYS 180 tablet 0  . ketoconazole (NIZORAL) 2 %  cream Apply 1 application topically daily. Use on corner of mouth 15 g 0  . midodrine (PROAMATINE) 10 MG tablet TAKE 2 TABLETS EVERY MORNING AND TAKE 1 TABLET EVERY EVENING 270 tablet 1  . mirtazapine (REMERON) 15 MG tablet Take 1 tablet (15 mg total) by mouth at bedtime. 90 tablet 1  . Multiple Vitamins-Minerals (PRESERVISION/LUTEIN PO) Take 1 capsule by mouth daily.    . mupirocin ointment (BACTROBAN) 2 % Use twice a day on hand 22 g 0  . nitroGLYCERIN (NITROSTAT) 0.4 MG SL tablet Place 1 tablet (0.4 mg total) under the tongue every 5 (five) minutes as needed for chest pain.    Marland Kitchen nystatin (MYCOSTATIN) 100000 UNIT/ML suspension Take 5 mLs (500,000 Units total) by mouth 4 (four) times daily. 60 mL 0  . omeprazole (PRILOSEC) 20 MG capsule TAKE 1 CAPSULE BY MOUTH TWICE A DAY 180 capsule 0  . senna-docusate (SENOKOT-S) 8.6-50 MG tablet Take 1 tablet by mouth 2 (two) times daily. For constipation.    . sodium bicarbonate 325 MG tablet Take 2 tablets (650 mg total) by mouth 3 (three) times daily. 180 tablet 3  . Travoprost, BAK Free, (TRAVATAN) 0.004 % SOLN ophthalmic solution Place 1 drop into both eyes at bedtime.     No current facility-administered medications for this visit.    Allergies:  Florinef [fludrocortisone acetate]; Iohexol; Sulfonamide derivatives; and Tape   Social History: The patient  reports that she quit smoking about 3 months ago. Her smoking use included cigarettes. She started smoking about 30 years ago. She has a 5.75 pack-year smoking history. she has never used smokeless tobacco. She reports that she does not drink alcohol or use drugs.   Family History: The patient's family history includes Breast cancer in her sister; Coronary artery disease in her father and mother; Esophageal cancer in her brother; Liver disease in her mother.   ROS:  Please see the history of present illness. Otherwise, complete review of systems is positive for {NONE DEFAULTED:18576::"none"}.  All other  systems are reviewed and negative.   Physical Exam: VS:  There were no vitals taken for this visit., BMI There is no height or weight on file to calculate BMI.  Wt Readings from Last 3 Encounters:  03/04/17 126 lb (57.2 kg)  01/30/17 126 lb (57.2 kg)  12/05/16 121 lb 6.4 oz (55.1 kg)    General: Patient appears comfortable at rest. HEENT: Conjunctiva and lids normal, oropharynx clear with moist mucosa. Neck: Supple, no elevated JVP or carotid bruits, no thyromegaly. Lungs: Clear to auscultation, nonlabored breathing at rest. Cardiac: Regular rate and rhythm, no S3 or significant systolic murmur, no pericardial rub. Abdomen: Soft, nontender, no hepatomegaly, bowel sounds present, no guarding or rebound.  Extremities: No pitting edema, distal pulses 2+. Skin: Warm and dry. Musculoskeletal: No kyphosis. Neuropsychiatric: Alert and oriented x3, affect grossly appropriate.  ECG: I personally reviewed the tracing from 10/16/2016 which showed sinus rhythm with PAC, increased voltage and repolarization abnormalities.  Recent Labwork: 07/03/2016: TSH 4.430 08/08/2016: Magnesium 2.6 12/05/2016: ALT 11; AST 18; BUN 27; Creatinine, Ser 1.74; Hemoglobin 11.6; Platelets 168; Potassium 4.4; Sodium 141   Other Studies Reviewed Today:  Echocardiogram 10/24/2016: Study Conclusions  - Left ventricle: The cavity size was normal. Wall thickness was   normal. Systolic function was mildly to moderately reduced. The   estimated ejection fraction was in the range of 40% to 45%.   Doppler parameters are consistent with abnormal left ventricular   relaxation (grade 1 diastolic dysfunction). Doppler parameters   are consistent with high ventricular filling pressure. - Regional wall motion abnormality: Hypokinesis of the mid   inferoseptal and basal-mid inferior myocardium. - Mitral valve: Mildly calcified annulus. There was moderate   regurgitation. - Left atrium: The atrium was mildly dilated. - Tricuspid  valve: There was mild regurgitation. - Pulmonary arteries: PA peak pressure: 34 mm Hg (S).  Assessment and Plan:    Current medicines were reviewed with the patient today.  No orders of the defined types were placed in this encounter.   Disposition:  Signed, Satira Sark, MD, [facility] 05/29/2017 2:59 PM    [facility] [place], [place], [place] [address] Phone: [phone]; Fax: [phone]

## 2017-05-30 ENCOUNTER — Ambulatory Visit: Payer: Medicare Other | Admitting: Cardiology

## 2017-05-30 ENCOUNTER — Observation Stay (HOSPITAL_COMMUNITY)
Admission: EM | Admit: 2017-05-30 | Discharge: 2017-06-04 | Disposition: A | Discharge status: Home / Self Care | Payer: Medicare Other | Attending: Internal Medicine | Admitting: Internal Medicine

## 2017-05-30 ENCOUNTER — Other Ambulatory Visit: Payer: Self-pay

## 2017-05-30 ENCOUNTER — Emergency Department (HOSPITAL_COMMUNITY): Payer: Medicare Other

## 2017-05-30 ENCOUNTER — Encounter (HOSPITAL_COMMUNITY): Payer: Self-pay | Admitting: Emergency Medicine

## 2017-05-30 DIAGNOSIS — M3500 Sicca syndrome, unspecified: Secondary | ICD-10-CM | POA: Diagnosis not present

## 2017-05-30 DIAGNOSIS — F17210 Nicotine dependence, cigarettes, uncomplicated: Secondary | ICD-10-CM | POA: Insufficient documentation

## 2017-05-30 DIAGNOSIS — J449 Chronic obstructive pulmonary disease, unspecified: Secondary | ICD-10-CM | POA: Diagnosis not present

## 2017-05-30 DIAGNOSIS — R9389 Abnormal findings on diagnostic imaging of other specified body structures: Secondary | ICD-10-CM | POA: Diagnosis present

## 2017-05-30 DIAGNOSIS — R935 Abnormal findings on diagnostic imaging of other abdominal regions, including retroperitoneum: Secondary | ICD-10-CM | POA: Diagnosis present

## 2017-05-30 DIAGNOSIS — N184 Chronic kidney disease, stage 4 (severe): Secondary | ICD-10-CM | POA: Diagnosis not present

## 2017-05-30 DIAGNOSIS — E785 Hyperlipidemia, unspecified: Secondary | ICD-10-CM | POA: Diagnosis present

## 2017-05-30 DIAGNOSIS — R1011 Right upper quadrant pain: Secondary | ICD-10-CM | POA: Diagnosis not present

## 2017-05-30 DIAGNOSIS — I130 Hypertensive heart and chronic kidney disease with heart failure and stage 1 through stage 4 chronic kidney disease, or unspecified chronic kidney disease: Secondary | ICD-10-CM | POA: Insufficient documentation

## 2017-05-30 DIAGNOSIS — R1010 Upper abdominal pain, unspecified: Principal | ICD-10-CM | POA: Insufficient documentation

## 2017-05-30 DIAGNOSIS — M329 Systemic lupus erythematosus, unspecified: Secondary | ICD-10-CM | POA: Diagnosis present

## 2017-05-30 DIAGNOSIS — I5022 Chronic systolic (congestive) heart failure: Secondary | ICD-10-CM | POA: Insufficient documentation

## 2017-05-30 DIAGNOSIS — R0789 Other chest pain: Secondary | ICD-10-CM | POA: Insufficient documentation

## 2017-05-30 DIAGNOSIS — G8929 Other chronic pain: Secondary | ICD-10-CM | POA: Diagnosis present

## 2017-05-30 DIAGNOSIS — N179 Acute kidney failure, unspecified: Secondary | ICD-10-CM | POA: Diagnosis not present

## 2017-05-30 DIAGNOSIS — I951 Orthostatic hypotension: Secondary | ICD-10-CM | POA: Diagnosis present

## 2017-05-30 DIAGNOSIS — I2510 Atherosclerotic heart disease of native coronary artery without angina pectoris: Secondary | ICD-10-CM | POA: Insufficient documentation

## 2017-05-30 DIAGNOSIS — Z79899 Other long term (current) drug therapy: Secondary | ICD-10-CM | POA: Diagnosis not present

## 2017-05-30 DIAGNOSIS — Z7982 Long term (current) use of aspirin: Secondary | ICD-10-CM | POA: Insufficient documentation

## 2017-05-30 DIAGNOSIS — Z720 Tobacco use: Secondary | ICD-10-CM | POA: Diagnosis present

## 2017-05-30 DIAGNOSIS — M069 Rheumatoid arthritis, unspecified: Secondary | ICD-10-CM | POA: Insufficient documentation

## 2017-05-30 DIAGNOSIS — J479 Bronchiectasis, uncomplicated: Secondary | ICD-10-CM

## 2017-05-30 DIAGNOSIS — M545 Low back pain: Secondary | ICD-10-CM

## 2017-05-30 DIAGNOSIS — R160 Hepatomegaly, not elsewhere classified: Secondary | ICD-10-CM

## 2017-05-30 DIAGNOSIS — D649 Anemia, unspecified: Secondary | ICD-10-CM | POA: Diagnosis present

## 2017-05-30 DIAGNOSIS — I10 Essential (primary) hypertension: Secondary | ICD-10-CM | POA: Diagnosis present

## 2017-05-30 DIAGNOSIS — R079 Chest pain, unspecified: Secondary | ICD-10-CM | POA: Diagnosis present

## 2017-05-30 DIAGNOSIS — N189 Chronic kidney disease, unspecified: Secondary | ICD-10-CM

## 2017-05-30 DIAGNOSIS — I25119 Atherosclerotic heart disease of native coronary artery with unspecified angina pectoris: Secondary | ICD-10-CM | POA: Diagnosis present

## 2017-05-30 LAB — BASIC METABOLIC PANEL
Anion gap: 13 (ref 5–15)
BUN: 39 mg/dL — AB (ref 6–20)
CO2: 22 mmol/L (ref 22–32)
Calcium: 10.9 mg/dL — ABNORMAL HIGH (ref 8.9–10.3)
Chloride: 102 mmol/L (ref 101–111)
Creatinine, Ser: 2.61 mg/dL — ABNORMAL HIGH (ref 0.44–1.00)
GFR calc Af Amer: 20 mL/min — ABNORMAL LOW (ref >60)
GFR calc non Af Amer: 17 mL/min — ABNORMAL LOW (ref >60)
Glucose, Bld: 101 mg/dL — ABNORMAL HIGH (ref 65–99)
Potassium: 4.1 mmol/L (ref 3.5–5.1)
SODIUM: 137 mmol/L (ref 135–145)

## 2017-05-30 LAB — CBC
HEMATOCRIT: 30 % — AB (ref 36.0–46.0)
Hemoglobin: 9.3 g/dL — ABNORMAL LOW (ref 12.0–15.0)
MCH: 31.2 pg (ref 26.0–34.0)
MCHC: 31 g/dL (ref 30.0–36.0)
MCV: 100.7 fL — ABNORMAL HIGH (ref 78.0–100.0)
Platelets: 174 10*3/uL (ref 150–400)
RBC: 2.98 MIL/uL — ABNORMAL LOW (ref 3.87–5.11)
RDW: 14.5 % (ref 11.5–15.5)
WBC: 7.1 10*3/uL (ref 4.0–10.5)

## 2017-05-30 LAB — TROPONIN I
TROPONIN I: 0.03 ng/mL — AB (ref <0.03)
Troponin I: <0.03 ng/mL (ref <0.03)

## 2017-05-30 LAB — MAGNESIUM: Magnesium: 2.4 mg/dL (ref 1.7–2.4)

## 2017-05-30 MED ORDER — CYCLOSPORINE 0.05 % OP EMUL
1.0000 [drp] | Freq: Two times a day (BID) | OPHTHALMIC | Status: DC
  Administered 2017-05-31 – 2017-06-04 (×9): 1 [drp] via OPHTHALMIC
  Filled 2017-05-30 (×10): qty 1

## 2017-05-30 MED ORDER — PANTOPRAZOLE SODIUM 40 MG PO TBEC
40.0000 mg | DELAYED_RELEASE_TABLET | Freq: Every day | ORAL | Status: DC
Start: 2017-05-30 — End: 2017-06-04
  Administered 2017-05-30 – 2017-06-04 (×6): 40 mg via ORAL
  Filled 2017-05-30 (×6): qty 1

## 2017-05-30 MED ORDER — DOCUSATE SODIUM 100 MG PO CAPS: 100.0000 mg | ORAL_CAPSULE | Freq: Two times a day (BID) | ORAL | Status: DC | PRN

## 2017-05-30 MED ORDER — SODIUM CHLORIDE 0.9 % IV SOLN: INTRAVENOUS | Status: DC

## 2017-05-30 MED ORDER — DIAZEPAM 2 MG PO TABS
2.0000 mg | ORAL_TABLET | Freq: Every day | ORAL | Status: DC | PRN
  Administered 2017-06-01 – 2017-06-02 (×2): 2 mg via ORAL
  Filled 2017-05-30 (×3): qty 1

## 2017-05-30 MED ORDER — POLYSACCHARIDE IRON COMPLEX 150 MG PO CAPS
150.0000 mg | ORAL_CAPSULE | Freq: Every day | ORAL | Status: DC
  Administered 2017-05-30 – 2017-06-04 (×6): 150 mg via ORAL
  Filled 2017-05-30 (×6): qty 1

## 2017-05-30 MED ORDER — ATORVASTATIN CALCIUM 40 MG PO TABS
40.0000 mg | ORAL_TABLET | Freq: Every day | ORAL | Status: DC
  Administered 2017-05-30 – 2017-06-03 (×5): 40 mg via ORAL
  Filled 2017-05-30 (×5): qty 1

## 2017-05-30 MED ORDER — HYDROXYCHLOROQUINE SULFATE 200 MG PO TABS
400.0000 mg | ORAL_TABLET | Freq: Every day | ORAL | Status: DC
Start: 2017-05-31 — End: 2017-05-31
  Administered 2017-05-31: 400 mg via ORAL
  Filled 2017-05-30: qty 2

## 2017-05-30 MED ORDER — ONDANSETRON HCL 4 MG PO TABS: 4.0000 mg | ORAL_TABLET | Freq: Four times a day (QID) | ORAL | Status: DC | PRN

## 2017-05-30 MED ORDER — CHOLECALCIFEROL 10 MCG (400 UNIT) PO TABS
400.0000 [IU] | ORAL_TABLET | Freq: Every evening | ORAL | Status: DC
  Administered 2017-05-30 – 2017-05-31 (×2): 400 [IU] via ORAL
  Filled 2017-05-30 (×2): qty 1

## 2017-05-30 MED ORDER — ASPIRIN 81 MG PO CHEW
81.0000 mg | CHEWABLE_TABLET | Freq: Every day | ORAL | Status: DC
  Administered 2017-05-30: 81 mg via ORAL
  Filled 2017-05-30 (×2): qty 1

## 2017-05-30 MED ORDER — MIDODRINE HCL 5 MG PO TABS
20.0000 mg | ORAL_TABLET | Freq: Every day | ORAL | Status: DC
  Administered 2017-05-31 – 2017-06-04 (×5): 20 mg via ORAL
  Filled 2017-05-30 (×5): qty 4

## 2017-05-30 MED ORDER — ALBUTEROL SULFATE (2.5 MG/3ML) 0.083% IN NEBU: 2.5000 mg | INHALATION_SOLUTION | Freq: Four times a day (QID) | RESPIRATORY_TRACT | Status: DC | PRN

## 2017-05-30 MED ORDER — ENOXAPARIN SODIUM 30 MG/0.3ML ~~LOC~~ SOLN
30.0000 mg | SUBCUTANEOUS | Status: AC
  Administered 2017-05-30 – 2017-05-31 (×2): 30 mg via SUBCUTANEOUS
  Filled 2017-05-30 (×2): qty 0.3

## 2017-05-30 MED ORDER — TEMAZEPAM 15 MG PO CAPS
15.0000 mg | ORAL_CAPSULE | Freq: Every evening | ORAL | Status: DC | PRN
  Administered 2017-05-30 – 2017-06-03 (×4): 15 mg via ORAL
  Filled 2017-05-30 (×4): qty 1

## 2017-05-30 MED ORDER — OXYCODONE-ACETAMINOPHEN 5-325 MG PO TABS
1.0000 | ORAL_TABLET | Freq: Four times a day (QID) | ORAL | Status: DC | PRN
  Administered 2017-05-31: 1 via ORAL
  Filled 2017-05-30 (×2): qty 1

## 2017-05-30 MED ORDER — FOLIC ACID 1 MG PO TABS
1.0000 mg | ORAL_TABLET | Freq: Every morning | ORAL | Status: DC
  Administered 2017-05-31 – 2017-06-04 (×5): 1 mg via ORAL
  Filled 2017-05-30 (×5): qty 1

## 2017-05-30 MED ORDER — KETOCONAZOLE 2 % EX CREA
1.0000 "application " | TOPICAL_CREAM | Freq: Two times a day (BID) | CUTANEOUS | Status: DC | PRN
  Filled 2017-05-30: qty 15

## 2017-05-30 MED ORDER — LATANOPROST 0.005 % OP SOLN
1.0000 [drp] | Freq: Every day | OPHTHALMIC | Status: DC
  Administered 2017-05-31 – 2017-06-02 (×3): 1 [drp] via OPHTHALMIC
  Filled 2017-05-30: qty 2.5

## 2017-05-30 MED ORDER — SODIUM CHLORIDE 0.9 % IV SOLN
INTRAVENOUS | Status: DC
  Administered 2017-05-30: 23:00:00 via INTRAVENOUS

## 2017-05-30 MED ORDER — SENNOSIDES-DOCUSATE SODIUM 8.6-50 MG PO TABS
1.0000 | ORAL_TABLET | Freq: Two times a day (BID) | ORAL | Status: DC
  Administered 2017-05-30 – 2017-06-03 (×4): 1 via ORAL
  Filled 2017-05-30 (×8): qty 1

## 2017-05-30 MED ORDER — ONDANSETRON HCL 4 MG/2ML IJ SOLN: 4.0000 mg | Freq: Four times a day (QID) | INTRAMUSCULAR | Status: DC | PRN

## 2017-05-30 MED ORDER — MIDODRINE HCL 5 MG PO TABS: 10.0000 mg | ORAL_TABLET | Freq: Two times a day (BID) | ORAL | Status: DC

## 2017-05-30 MED ORDER — SODIUM BICARBONATE 650 MG PO TABS
650.0000 mg | ORAL_TABLET | Freq: Three times a day (TID) | ORAL | Status: DC
  Administered 2017-05-30 – 2017-06-04 (×14): 650 mg via ORAL
  Filled 2017-05-30 (×14): qty 1

## 2017-05-30 MED ORDER — MIDODRINE HCL 5 MG PO TABS
10.0000 mg | ORAL_TABLET | Freq: Every day | ORAL | Status: DC
  Administered 2017-05-31 – 2017-06-03 (×4): 10 mg via ORAL
  Filled 2017-05-30 (×4): qty 2

## 2017-05-30 MED ORDER — MORPHINE SULFATE (PF) 4 MG/ML IV SOLN
4.0000 mg | Freq: Once | INTRAVENOUS | Status: AC
  Administered 2017-05-30: 4 mg via INTRAVENOUS
  Filled 2017-05-30: qty 1

## 2017-05-30 NOTE — ED Notes (Signed)
Family at bedside Pt reports meds decreased pain, but not much  She then reports that I takes a lot of pain medicine for her  Awaiting bed ready

## 2017-05-30 NOTE — ED Triage Notes (Signed)
Patient reports chest pain that started around 1300 today. No nausea or emesis, pt reports diaphoresis. Patient reports pain down her L arm.

## 2017-05-30 NOTE — ED Notes (Signed)
Pt reports CP at 1000 this am into her L arm and shoulder  She reports choking on Monday ad family member did the [place]   Now states she has no CP, but has pain to her R rib and abd area

## 2017-05-30 NOTE — Progress Notes (Signed)
Critical lab value received. Troponin 0.03. [facility] have been notified @ [phone]. Awaiting for response.

## 2017-05-30 NOTE — ED Notes (Signed)
Pt returned from CT °

## 2017-05-30 NOTE — H&P (Signed)
History and Physical    MALEEHA HALLS [name]:[id number] DOB: [birth year] DOA: 05/30/2017  PCP: Eustaquio Maize, MD   Patient coming from: Home.  I have personally briefly reviewed patient's old medical records in [place]  Chief Complaint: Chest pain.  HPI: Tiffany [name] is a 77 y.o. female with medical history significant of anemia, CAD, cardiomyopathy, chronic lower back pain, stage III chronic kidney disease, COPD, depression, dysphagia, history of blood transfusion, hyperlipidemia, urolithiasis, lymphoma, orthostatic hypotension on midodrine, history of postmenopausal bleeding, history of pneumonia, rheumatoid arthritis, Sjogren's syndrome, SLE, history of syncope who is coming to the emergency department with complaints of chest pain that started around 1300 today associated with diaphoresis.  She denies dyspnea, dizziness, nausea, emesis, lower extremity edema, PND orthopnea.  She gets frequent palpitations, but denies any palpitations at the time of the chest pain.  She mentions that the pain also extends to her upper abdomen, and she believes that this is due to having the Heimlich maneuver performed on her on Monday.  She denies headache, fever, chills, sore throat, recent wheezing, hemoptysis, diarrhea, melena or hematochezia.  She gets frequent constipation.  She denies dysuria, frequency, hematuria or oliguria.  Denies polyuria, polydipsia, polyphagia or blurred vision.  ED Course: Initial vital signs temperature 36.8C (98.3 F), pulse 76, respirations 18, blood pressure 130/69 mmHg and O2 sat 97% on room air.  Her workup shows a CBC with a white count of 7.1, hemoglobin 9.3 and platelets 174.  Chemistry shows normal electrolytes.  Glucose is 101, BUN 39, creatinine 2.60, magnesium 2.4 and calcium 10.9 mg/dL.  Her baseline creatinine usually is 0.8-1.1 mg/dL lower.  Troponin was 0.03 ng/mL.  EKG was normal sinus rhythm with sinus arrhythmia.   CT Chest/abdomen/pelvis without  contrast  IMPRESSION: 1. Negative for pneumothorax or pleural effusion. No definite acute displaced rib fracture is seen. 2. Interim development of multiple ill-defined hypodense liver masses, concerning for metastatic disease. 3. Ill-defined masslike process in the left upper quadrant, between the upper pole of left kidney and spleen, uncertain if this is related to the tail of the pancreas or intraperitoneal inflammatory process. Metastatic disease is also considered given the new findings of multiple liver masses. Contrast-enhanced examination would be helpful to further evaluate. 4. Similar appearance of mediastinal, upper abdominal and retroperitoneal mild adenopathy. 5. Multifocal areas of bronchiectasis and parenchymal scarring in the lungs. Mild mucous plugging in the left lower lobe with mild ground-glass density in the right middle and left lower lobes, findings could be secondary to chronic infection such as MAI. 6. Multiple stones within the bilateral kidneys.  No hydronephrosis.   Review of Systems: As per HPI otherwise 10 point review of systems negative.    Past Medical History:  Diagnosis Date  . Anemia   . CAD (coronary artery disease)    DES to mid RCA 2006  . Cardiomyopathy ([place])   . Chronic lower back pain   . CKD (chronic kidney disease) stage 3, GFR 30-59 ml/min (HCC)   . COPD (chronic obstructive pulmonary disease) ([place]) Dx'd 09/28/2015  . Depression   . Dysphagia   . History of blood transfusion 09/2015  . Hyperlipidemia   . Kidney stones   . Lymphoma ([place])   . Orthostatic hypotension   . PMB (postmenopausal bleeding) 06/12/2015  . Pneumonia 09/28/2015  . Rheumatoid arthritis ([place])   . Sjogren's syndrome ([place])   . Syncope   . Systemic lupus erythematosus (HCC)     Past  Surgical History:  Procedure Laterality Date  . BREAST BIOPSY Left   . CARDIAC CATHETERIZATION N/A 10/06/2015   Procedure: Right/Left Heart Cath and Coronary Angiography;   Surgeon: Leonie Man, MD;  Location: [facility];  Service: Cardiovascular;  Laterality: N/A;  . CATARACT EXTRACTION W/ INTRAOCULAR LENS  IMPLANT, BILATERAL Bilateral   . CORONARY ANGIOPLASTY WITH STENT PLACEMENT  2006   DES to mid RCA   . DILATION AND CURETTAGE OF UTERUS    . EYE SURGERY    . LAPAROSCOPIC CHOLECYSTECTOMY    . LITHOTRIPSY  X 3  . LYMPH NODE DISSECTION Left ~ 2000   "neck"  . MASS EXCISION Left ~ 2000   Lymphoma  . RETINAL DETACHMENT SURGERY Right   . TONSILLECTOMY Left ~ 2000  . TUBAL LIGATION       reports that she has been smoking cigarettes.  She started smoking about 30 years ago. She has a 5.75 pack-year smoking history. she has never used smokeless tobacco. She reports that she does not drink alcohol or use drugs.  Allergies  Allergen Reactions  . Florinef [Fludrocortisone Acetate]     Malaise: Discomfort   . Iohexol Other (See Comments)    Pt. States that Dr. Florene Glen her kidney Dr. Does not want her to receive iv dye due to increased risk of kidney failure.   . Sulfonamide Derivatives Hives  . Tape Rash    ADHESIVE    Family History  Problem Relation Age of Onset  . Coronary artery disease Father   . Coronary artery disease Mother   . Liver disease Mother   . Esophageal cancer Brother   . Breast cancer Sister     Prior to Admission medications   Medication Sig Start Date End Date Taking? Authorizing Provider  albuterol (PROVENTIL) (2.5 MG/3ML) 0.083% nebulizer solution Take 3 mLs (2.5 mg total) by nebulization every 6 (six) hours as needed for wheezing or shortness of breath. 09/18/15   Robbie Lis, MD  Artificial Tear Solution (SOOTHE XP OP) Apply 1 drop to eye 3 (three) times daily.     [provider]  aspirin 81 MG tablet Take 81 mg by mouth daily.    [provider]  atorvastatin (LIPITOR) 40 MG tablet Take 1 tablet (40 mg total) by mouth daily. 03/04/17   Eustaquio Maize, MD  Cholecalciferol (VITAMIN D) 400  UNITS capsule Take 400 Units by mouth every evening.     [provider]  clobetasol cream (TEMOVATE) 8.52 % Apply 1 application topically 2 (two) times daily.    [provider]  cycloSPORINE (RESTASIS) 0.05 % ophthalmic emulsion Place 1 drop into both eyes 2 (two) times daily.    [provider]  diazepam (VALIUM) 2 MG tablet One tab in the morning, 1-2 tabs at night 03/04/17   Eustaquio Maize, MD  docusate sodium (COLACE) 100 MG capsule Take 100 mg by mouth 2 (two) times daily as needed for mild constipation.    [provider]  FERREX 150 150 MG capsule TAKE 1 CAPSULE (150 MG TOTAL) BY MOUTH DAILY. 01/15/17   Eustaquio Maize, MD  FERREX 150 150 MG capsule TAKE 1 CAPSULE (150 MG TOTAL) BY MOUTH DAILY. 04/23/17   Eustaquio Maize, MD  folic acid (FOLVITE) 1 MG tablet TAKE 1 TABLET (1 MG TOTAL) BY MOUTH EVERY MORNING. 02/03/17   Eustaquio Maize, MD  HYDROcodone-acetaminophen (NORCO/VICODIN) 5-325 MG tablet Take 1 tablet by mouth every 12 (  twelve) hours as needed for moderate pain. 03/04/17   Eustaquio Maize, MD  HYDROcodone-acetaminophen (NORCO/VICODIN) 5-325 MG tablet Take 1 tablet by mouth every 12 (twelve) hours as needed for moderate pain. 03/04/17   Eustaquio Maize, MD  HYDROcodone-acetaminophen (NORCO/VICODIN) 5-325 MG tablet Take 1 tablet by mouth every 12 (twelve) hours as needed for moderate pain. 03/04/17   Eustaquio Maize, MD  hydroxychloroquine (PLAQUENIL) 200 MG tablet TAKE 2 TABLET WITH FOOD OR MILK ONCE A DAY ORALLY 30 DAYS 04/08/17   Eustaquio Maize, MD  ketoconazole (NIZORAL) 2 % cream Apply 1 application topically daily. Use on corner of mouth 01/30/17   Eustaquio Maize, MD  midodrine (PROAMATINE) 10 MG tablet TAKE 2 TABLETS EVERY MORNING AND TAKE 1 TABLET EVERY EVENING 03/13/17   Eustaquio Maize, MD  mirtazapine (REMERON) 15 MG tablet Take 1 tablet (15 mg total) by mouth at bedtime. 03/04/17   Eustaquio Maize, MD  Multiple Vitamins-Minerals  (PRESERVISION/LUTEIN PO) Take 1 capsule by mouth daily.    [provider]  mupirocin ointment (BACTROBAN) 2 % Use twice a day on hand 01/30/17   Eustaquio Maize, MD  nystatin (MYCOSTATIN) 100000 UNIT/ML suspension Take 5 mLs (500,000 Units total) by mouth 4 (four) times daily. 03/07/17   Hassell Done Stevi-Margaret, FNP  omeprazole (PRILOSEC) 20 MG capsule TAKE 1 CAPSULE BY MOUTH TWICE A DAY 04/23/17   Terald Sleeper, PA-C  senna-docusate (SENOKOT-S) 8.6-50 MG tablet Take 1 tablet by mouth 2 (two) times daily. For constipation. 11/03/15   Rexene Alberts, MD  sodium bicarbonate 325 MG tablet Take 2 tablets (650 mg total) by mouth 3 (three) times daily. 03/04/17   Eustaquio Maize, MD  Travoprost, BAK Free, (TRAVATAN) 0.004 % SOLN ophthalmic solution Place 1 drop into both eyes at bedtime.    [provider]    Physical Exam: Vitals:   05/30/17 1930 05/30/17 2002 05/30/17 2051 05/30/17 2112  BP: (!) 141/67 (!) 141/67  (!) 147/65  Pulse:  68  67  Resp: 17 12  14   Temp:    98.3 F (36.8 C)  TempSrc:    Oral  SpO2:  100% 100% 92%  Weight:      Height:        Constitutional: NAD, calm, comfortable Eyes: PERRL, lids and conjunctivae normal ENMT: Mucous membranes are dry. Posterior pharynx clear of any exudate or lesions. Neck: normal, supple, no masses, no thyromegaly Respiratory: clear to auscultation bilaterally, no wheezing, no crackles. Normal respiratory effort. No accessory muscle use.  Cardiovascular: Regular rate and rhythm, no murmurs / rubs / gallops. No extremity edema. 2+ pedal pulses. No carotid bruits.   Chest: Positive lower rib cage and substernal tenderness on palpation.  abdomen: Nondistended.  Bowel sounds positive.soft, mild epigastric tenderness on palpation, no guarding/rebound/masses palpated. No hepatosplenomegaly.   Musculoskeletal: no clubbing / cyanosis.Good ROM, no contractures. Normal muscle tone.  Skin: no significant rashes, lesions, ulcers on  limited dermatological exam. Neurologic: CN 2-12 grossly intact. Sensation intact, DTR normal. Strength 5/5 in all 4.  Psychiatric: Normal judgment and insight. Alert and oriented x 3. Normal mood.    Labs on Admission: I have personally reviewed following labs and imaging studies  CBC: Recent Labs  Lab 05/30/17 1457  WBC 7.1  HGB 9.3*  HCT 30.0*  MCV 100.7*  PLT 166   Basic Metabolic Panel: Recent Labs  Lab 05/30/17 1457  NA 137  K 4.1  CL 102  CO2 22  GLUCOSE 101*  BUN 39*  CREATININE 2.61*  CALCIUM 10.9*   GFR: Estimated Creatinine Clearance: 16.8 mL/min (A) (by C-G formula based on SCr of 2.61 mg/dL (H)). Liver Function Tests: No results for input(s): AST, ALT, ALKPHOS, BILITOT, PROT, ALBUMIN in the last 168 hours. No results for input(s): LIPASE, AMYLASE in the last 168 hours. No results for input(s): AMMONIA in the last 168 hours. Coagulation Profile: No results for input(s): INR, PROTIME in the last 168 hours. Cardiac Enzymes: Recent Labs  Lab 05/30/17 1457  TROPONINI <0.03   BNP (last 3 results) No results for input(s): PROBNP in the last 8760 hours. HbA1C: No results for input(s): HGBA1C in the last 72 hours. CBG: No results for input(s): GLUCAP in the last 168 hours. Lipid Profile: No results for input(s): CHOL, HDL, LDLCALC, TRIG, CHOLHDL, LDLDIRECT in the last 72 hours. Thyroid Function Tests: No results for input(s): TSH, T4TOTAL, FREET4, T3FREE, THYROIDAB in the last 72 hours. Anemia Panel: No results for input(s): VITAMINB12, FOLATE, FERRITIN, TIBC, IRON, RETICCTPCT in the last 72 hours. Urine analysis:    Component Value Date/Time   COLORURINE YELLOW 10/28/2015 2023   APPEARANCEUR CLEAR 10/28/2015 2023   LABSPEC 1.020 10/28/2015 2023   PHURINE 6.0 10/28/2015 2023   GLUCOSEU NEGATIVE 10/28/2015 2023   HGBUR NEGATIVE 10/28/2015 2023   BILIRUBINUR NEGATIVE 10/28/2015 2023   KETONESUR NEGATIVE 10/28/2015 2023   PROTEINUR 30 (A) 10/28/2015  2023   UROBILINOGEN 0.2 11/03/2014 1100   NITRITE NEGATIVE 10/28/2015 2023   LEUKOCYTESUR TRACE (A) 10/28/2015 2023    Radiological Exams on Admission: Ct Abdomen Pelvis Wo Contrast  Result Date: 05/30/2017 CLINICAL DATA:  Chest pain pain to the right rib and abdominal area EXAM: CT CHEST, ABDOMEN AND PELVIS WITHOUT CONTRAST TECHNIQUE: Multidetector CT imaging of the chest, abdomen and pelvis was performed following the standard protocol without IV contrast. COMPARISON:  CT abdomen pelvis 10/29/2015, 09/08/2015, CT chest 09/06/2011; chest radiograph 05/30/2017 FINDINGS: CT CHEST FINDINGS Cardiovascular: Limited evaluation without intravenous contrast. Nonaneurysmal aorta. Moderate aortic atherosclerotic calcifications. Coronary artery calcification. Normal heart size. No pericardial effusion Mediastinum/Nodes: Midline trachea. No thyroid mass. 12 mm pretracheal lymph node, similar compared to 2013 CT chest. Esophagus shows a small distal hiatal hernia Lungs/Pleura: Bronchiectasis in the left upper lobe with parenchymal scarring. Scattered scarring at the right apex. Mild bronchiectasis in the right middle lobe and bilateral lower lobes with mild mucous plugging in the left lower lobe dilated bronchi scattered ground-glass densities in the left lower lobe and right middle lobe. Negative for pneumothorax or pleural effusion Musculoskeletal: Degenerative changes of the spine. No acute or suspicious bone lesion CT ABDOMEN PELVIS FINDINGS Hepatobiliary: Interim development of multiple hypodense liver masses. Surgical clips at the gallbladder fossa. No biliary dilatation Pancreas: Atrophic.  No inflammation Spleen: Normal in size without focal abnormality. Adrenals/Urinary Tract: Adrenal glands are within normal limits. Multiple stones are present within both kidneys, the largest on the right is seen in the upper pole and measures 7 mm, the largest on the left is seen in the lower pole and measures 6 mm. Cyst in  the upper pole of the left kidney. Scarring in the bilateral kidneys. No hydronephrosis. Lobulated contour of the mid right kidney. Bladder unremarkable Stomach/Bowel: The stomach is nonenlarged. No dilated small bowel. No colon wall thickening. Vascular/Lymphatic: Extensive aortic atherosclerosis. No aneurysmal dilatation. Lymph nodes adjacent to the GE junction unchanged. Multiple upper abdominal and periaortic lymph nodes, similar compared to the prior CT. 2.1 by 0.9 cm  left periaortic lymph node, series 8, image number 24 is unchanged. Reproductive: Uterus is unremarkable.  No adnexal masses Other: Negative for free air or ascites. Ill-defined masslike process within the left upper quadrant of the abdomen between the spleen and upper pole of the left kidney. This is not seen on the prior study. Musculoskeletal: Degenerative changes of the spine. No suspicious bone lesions. 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 IMPRESSION: 1. Negative for pneumothorax or pleural effusion. No definite acute displaced rib fracture is seen. 2. Interim development of multiple ill-defined hypodense liver masses, concerning for metastatic disease. 3. Ill-defined masslike process in the left upper quadrant, between the upper pole of left kidney and spleen, uncertain if this is related to the tail of the pancreas or intraperitoneal inflammatory process. Metastatic disease is also considered given the new findings of multiple liver masses. Contrast-enhanced examination would be helpful to further evaluate. 4. Similar appearance of mediastinal, upper abdominal and retroperitoneal mild adenopathy. 5. Multifocal areas of bronchiectasis and parenchymal scarring in the lungs. Mild mucous plugging in the left lower lobe with mild ground-glass density in the right middle and left lower lobes, findings could be secondary to chronic infection such as MAI. 6. Multiple stones within the bilateral kidneys.  No hydronephrosis. Electronically Signed   By:  Donavan Foil M.D.   On: 05/30/2017 18:38   Dg Chest 2 View  Result Date: 05/30/2017 CLINICAL DATA:  Bilateral lower rib pain. EXAM: CHEST  2 VIEW COMPARISON:  11/22/2015 FINDINGS: Cardiomediastinal silhouette is normal. Mediastinal contours appear intact. Calcific atherosclerotic disease of the aorta. There is no evidence of focal airspace consolidation, pleural effusion or pneumothorax. Chronic hyperinflation of the lungs with upper lobe predominant emphysematous changes and mild chronic interstitial lung changes. Bilateral apical pleural/subpleural linear and nodular thickening. Osseous structures are without acute abnormality. No displaced rib fractures are seen. Soft tissues are grossly normal. IMPRESSION: No displaced rib fractures are seen. Chronic changes of COPD. Calcific atherosclerotic disease of the aorta. Electronically Signed   By: Fidela Salisbury M.D.   On: 05/30/2017 15:33   Ct Chest Wo Contrast  Result Date: 05/30/2017 CLINICAL DATA:  Chest pain pain to the right rib and abdominal area EXAM: CT CHEST, ABDOMEN AND PELVIS WITHOUT CONTRAST TECHNIQUE: Multidetector CT imaging of the chest, abdomen and pelvis was performed following the standard protocol without IV contrast. COMPARISON:  CT abdomen pelvis 10/29/2015, 09/08/2015, CT chest 09/06/2011; chest radiograph 05/30/2017 FINDINGS: CT CHEST FINDINGS Cardiovascular: Limited evaluation without intravenous contrast. Nonaneurysmal aorta. Moderate aortic atherosclerotic calcifications. Coronary artery calcification. Normal heart size. No pericardial effusion Mediastinum/Nodes: Midline trachea. No thyroid mass. 12 mm pretracheal lymph node, similar compared to 2013 CT chest. Esophagus shows a small distal hiatal hernia Lungs/Pleura: Bronchiectasis in the left upper lobe with parenchymal scarring. Scattered scarring at the right apex. Mild bronchiectasis in the right middle lobe and bilateral lower lobes with mild mucous plugging in the left  lower lobe dilated bronchi scattered ground-glass densities in the left lower lobe and right middle lobe. Negative for pneumothorax or pleural effusion Musculoskeletal: Degenerative changes of the spine. No acute or suspicious bone lesion CT ABDOMEN PELVIS FINDINGS Hepatobiliary: Interim development of multiple hypodense liver masses. Surgical clips at the gallbladder fossa. No biliary dilatation Pancreas: Atrophic.  No inflammation Spleen: Normal in size without focal abnormality. Adrenals/Urinary Tract: Adrenal glands are within normal limits. Multiple stones are present within both kidneys, the largest on the right is seen in the upper pole and measures 7 mm, the  largest on the left is seen in the lower pole and measures 6 mm. Cyst in the upper pole of the left kidney. Scarring in the bilateral kidneys. No hydronephrosis. Lobulated contour of the mid right kidney. Bladder unremarkable Stomach/Bowel: The stomach is nonenlarged. No dilated small bowel. No colon wall thickening. Vascular/Lymphatic: Extensive aortic atherosclerosis. No aneurysmal dilatation. Lymph nodes adjacent to the GE junction unchanged. Multiple upper abdominal and periaortic lymph nodes, similar compared to the prior CT. 2.1 by 0.9 cm left periaortic lymph node, series 8, image number 24 is unchanged. Reproductive: Uterus is unremarkable.  No adnexal masses Other: Negative for free air or ascites. Ill-defined masslike process within the left upper quadrant of the abdomen between the spleen and upper pole of the left kidney. This is not seen on the prior study. Musculoskeletal: Degenerative changes of the spine. No suspicious bone lesions. 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 IMPRESSION: 1. Negative for pneumothorax or pleural effusion. No definite acute displaced rib fracture is seen. 2. Interim development of multiple ill-defined hypodense liver masses, concerning for metastatic disease. 3. Ill-defined masslike process in the left upper  quadrant, between the upper pole of left kidney and spleen, uncertain if this is related to the tail of the pancreas or intraperitoneal inflammatory process. Metastatic disease is also considered given the new findings of multiple liver masses. Contrast-enhanced examination would be helpful to further evaluate. 4. Similar appearance of mediastinal, upper abdominal and retroperitoneal mild adenopathy. 5. Multifocal areas of bronchiectasis and parenchymal scarring in the lungs. Mild mucous plugging in the left lower lobe with mild ground-glass density in the right middle and left lower lobes, findings could be secondary to chronic infection such as MAI. 6. Multiple stones within the bilateral kidneys.  No hydronephrosis. Electronically Signed   By: Donavan Foil M.D.   On: 05/30/2017 18:38   10/24/2016 echocardiogram complete ------------------------------------------------------------------- LV EF: 40% -   45%  ------------------------------------------------------------------- Indications:      Cardiomyopathy - nonischemic.  ------------------------------------------------------------------- History:   PMH:   Syncope.  Coronary artery disease.  PMH: Pulmonary embolism, Chronic anticoagulation, NSTEMI (non-ST elevated myocardial infarction) Sjogren&'s syndrome LYMPHOMA.  Risk factors:  Current tobacco use. Hypertension. Dyslipidemia.  ------------------------------------------------------------------- Study Conclusions  - Left ventricle: The cavity size was normal. Wall thickness was   normal. Systolic function was mildly to moderately reduced. The   estimated ejection fraction was in the range of 40% to 45%.   Doppler parameters are consistent with abnormal left ventricular   relaxation (grade 1 diastolic dysfunction). Doppler parameters   are consistent with high ventricular filling pressure. - Regional wall motion abnormality: Hypokinesis of the mid   inferoseptal and basal-mid  inferior myocardium. - Mitral valve: Mildly calcified annulus. There was moderate   regurgitation. - Left atrium: The atrium was mildly dilated. - Tricuspid valve: There was mild regurgitation. - Pulmonary arteries: PA peak pressure: 34 mm Hg (S).  EKG: Independently reviewed.  Vent. rate 76 BPM PR interval 162 ms QRS duration 90 ms QT/QTc 404/454 ms P-R-T axes 78 77 51 Normal sinus rhythm with sinus arrhythmia Normal ECG  Assessment/Plan Principal Problem:   AKI (acute kidney injury) (HCC)   CKD (chronic kidney disease) stage 4, GFR 15-29 ml/min (HCC) Continue gentle IV hydration. Monitor intake and output. Follow-up BUN, creatinine and electrolytes. Will get renal imaging in a.m. She is scheduled for MRI of the abdomen.   Active Problems:   Abnormal CT of the abdomen Discussed findings with the patient. The patient declined having  contrast due to her renal history. She has agreed to get an MRI of the abdomen without contrast.    Abnormal CT of the chest No fever, no cough, no leukocytosis. Could this be autoimmune in nature? Will defer IV antibiotics for now    Tobacco abuse Declined nicotine replacement therapy for the moment.    Essential hypertension Off antihypertensive medications due to hypotension. On Midodrin 100 mg in the morning and 10 mg in the evening Monitor blood pressure.    Hyperlipidemia Continue atorvastatin 40 mg p.o. daily. Monitor LFTs as needed. Fasting lipid panel follow-up as an outpatient per PCP.    Anemia Monitor hematocrit and hemoglobin.    Rheumatoid arthritis (HCC)   Sjogren's syndrome (HCC)   Systemic lupus erythematosus (HCC) Continue hydroxychloroquine 400 mg p.o. daily    Coronary artery disease involving native heart with angina pectoris (HCC) Continue aspirin and atorvastatin. Not on beta-blocker therapy due to chronic hypotension.    Chronic lower back pain Per patient, her hydrocodone is working as well. She  seems to have better results with oxycodone, which she used for 2 weeks after knee surgery. Will try oxycodone and discontinue hydrocodone.    Hypercalcemia Likely due to a combination of dehydration and oral supplementation. Continue gentle/time limited IV hydration. Recheck calcium level in a.m. Consider checking PTH and vitamin D level if it persists.    DVT prophylaxis: Lovenox SQ. Code Status: Full code. Family Communication:  Disposition Plan: Admit for IV hydration and MRI abdomen without contrast in a.m. Consults called: Routine pulmonology consult. Admission status: Observation/telemetry.   Reubin Milan MD [facility] Pager [phone].  If 7PM-7AM, please contact night-coverage [email or web address] Password [facility]  05/30/2017, 9:14 PM

## 2017-05-30 NOTE — ED Provider Notes (Signed)
[facility] EMERGENCY DEPARTMENT Provider Note   CSN: [id number] Arrival date & time: 05/30/17  1435     History   Chief Complaint Chief Complaint  Patient presents with  . Chest Pain    HPI Tiffany [name] is a 77 y.o. female.  Patient states that she is having some chest and abdominal pain.  She attributes this to a recent attempt with a Heimlich maneuver on her.   The history is provided by the patient. No language interpreter was used.  Abdominal Pain   This is a new problem. The current episode started more than 2 days ago. The problem occurs constantly. The problem has not changed since onset.The pain is associated with an unknown factor. The pain is located in the RUQ and epigastric region. The quality of the pain is aching. The pain is at a severity of 5/10. Pertinent negatives include diarrhea, frequency, hematuria and headaches.    Past Medical History:  Diagnosis Date  . Anemia   . CAD (coronary artery disease)    DES to mid RCA 2006  . Cardiomyopathy ([place])   . Chronic lower back pain   . CKD (chronic kidney disease) stage 3, GFR 30-59 ml/min (HCC)   . COPD (chronic obstructive pulmonary disease) ([place]) Dx'd 09/28/2015  . Depression   . Dysphagia   . History of blood transfusion 09/2015  . Hyperlipidemia   . Kidney stones   . Lymphoma ([place])   . Orthostatic hypotension   . PMB (postmenopausal bleeding) 06/12/2015  . Pneumonia 09/28/2015  . Rheumatoid arthritis ([place])   . Sjogren's syndrome ([place])   . Syncope   . Systemic lupus erythematosus ([place])     Patient Active Problem List   Diagnosis Date Noted  . AKI (acute kidney injury) ([place]) 05/30/2017  . Hypothyroid 07/03/2016  . Anxiety 07/03/2016  . Chronic back pain 07/03/2016  . Lupus (systemic lupus erythematosus) ([place]) 11/02/2015  . Syncope 11/02/2015  . Chronic systolic CHF (congestive heart failure) ([place]) 10/31/2015  . Dehydration 10/28/2015  . Near syncope 10/28/2015  . CKD (chronic kidney disease)  stage 4, GFR 15-29 ml/min (HCC) 10/28/2015  . Anemia of chronic disease   . Cardiomyopathy, ischemic 10/05/2015  . Abnormal nuclear stress test 10/05/2015  . Acute bronchitis with COPD ([place]) 09/28/2015  . Recent NSTEMI (non-ST elevated myocardial infarction) ([place]) 09/15/2015  . Fever 09/15/2015  . Non-ST elevated myocardial infarction ([place]) 09/15/2015  . PMB (postmenopausal bleeding) 06/12/2015  . Atypical chest pain 07/13/2014  . DOE (dyspnea on exertion) 10/22/2013  . CHF, acute ([place]) 10/22/2013  . Systemic lupus erythematosus ([place])   . Coronary artery disease involving native heart with angina pectoris ([place])   . Palpitations 04/21/2013  . Chronic anticoagulation 01/29/2012  . Pulmonary embolism ([place]) 11/28/2011  . Chest pain 09/06/2011  . CKD (chronic kidney disease), stage IV ([place]) 05/24/2011  . Essential hypertension   . Hyperlipidemia   . Arteriosclerotic cardiovascular disease (ASCVD)   . Sjogren's syndrome ([place])   . Tobacco abuse 08/08/2010  . WEIGHT LOSS, ABNORMAL 07/05/2010  . Orthostatic hypotension 01/24/2010  . SYNCOPE 08/28/2009  . LYMPHOMA 10/31/2008    Past Surgical History:  Procedure Laterality Date  . BREAST BIOPSY Left   . CARDIAC CATHETERIZATION N/A 10/06/2015   Procedure: Right/Left Heart Cath and Coronary Angiography;  Surgeon: Leonie Man, MD;  Location: [facility];  Service: Cardiovascular;  Laterality: N/A;  . CATARACT EXTRACTION W/ INTRAOCULAR LENS  IMPLANT, BILATERAL Bilateral   . CORONARY  ANGIOPLASTY WITH STENT PLACEMENT  2006   DES to mid RCA   . DILATION AND CURETTAGE OF UTERUS    . EYE SURGERY    . LAPAROSCOPIC CHOLECYSTECTOMY    . LITHOTRIPSY  X 3  . LYMPH NODE DISSECTION Left ~ 2000   "neck"  . MASS EXCISION Left ~ 2000   Lymphoma  . RETINAL DETACHMENT SURGERY Right   . TONSILLECTOMY Left ~ 2000  . TUBAL LIGATION      OB History    Gravida Para Term Preterm AB Living   3 3       3    SAB TAB Ectopic Multiple Live Births            3       Home Medications    Prior to Admission medications   Medication Sig Start Date End Date Taking? Authorizing Provider  albuterol (PROVENTIL) (2.5 MG/3ML) 0.083% nebulizer solution Take 3 mLs (2.5 mg total) by nebulization every 6 (six) hours as needed for wheezing or shortness of breath. 09/18/15   Robbie Lis, MD  Artificial Tear Solution (SOOTHE XP OP) Apply 1 drop to eye 3 (three) times daily.     [provider]  aspirin 81 MG tablet Take 81 mg by mouth daily.    [provider]  atorvastatin (LIPITOR) 40 MG tablet Take 1 tablet (40 mg total) by mouth daily. 03/04/17   Eustaquio Maize, MD  Cholecalciferol (VITAMIN D) 400 UNITS capsule Take 400 Units by mouth every evening.     [provider]  clobetasol cream (TEMOVATE) 3.84 % Apply 1 application topically 2 (two) times daily.    [provider]  cycloSPORINE (RESTASIS) 0.05 % ophthalmic emulsion Place 1 drop into both eyes 2 (two) times daily.    [provider]  diazepam (VALIUM) 2 MG tablet One tab in the morning, 1-2 tabs at night 03/04/17   Eustaquio Maize, MD  docusate sodium (COLACE) 100 MG capsule Take 100 mg by mouth 2 (two) times daily as needed for mild constipation.    [provider]  FERREX 150 150 MG capsule TAKE 1 CAPSULE (150 MG TOTAL) BY MOUTH DAILY. 01/15/17   Eustaquio Maize, MD  FERREX 150 150 MG capsule TAKE 1 CAPSULE (150 MG TOTAL) BY MOUTH DAILY. 04/23/17   Eustaquio Maize, MD  folic acid (FOLVITE) 1 MG tablet TAKE 1 TABLET (1 MG TOTAL) BY MOUTH EVERY MORNING. 02/03/17   Eustaquio Maize, MD  HYDROcodone-acetaminophen (NORCO/VICODIN) 5-325 MG tablet Take 1 tablet by mouth every 12 (twelve) hours as needed for moderate pain. 03/04/17   Eustaquio Maize, MD  HYDROcodone-acetaminophen (NORCO/VICODIN) 5-325 MG tablet Take 1 tablet by mouth every 12 (twelve) hours as needed for moderate pain. 03/04/17   Eustaquio Maize, MD    HYDROcodone-acetaminophen (NORCO/VICODIN) 5-325 MG tablet Take 1 tablet by mouth every 12 (twelve) hours as needed for moderate pain. 03/04/17   Eustaquio Maize, MD  hydroxychloroquine (PLAQUENIL) 200 MG tablet TAKE 2 TABLET WITH FOOD OR MILK ONCE A DAY ORALLY 30 DAYS 04/08/17   Eustaquio Maize, MD  ketoconazole (NIZORAL) 2 % cream Apply 1 application topically daily. Use on corner of mouth 01/30/17   Eustaquio Maize, MD  midodrine (PROAMATINE) 10 MG tablet TAKE 2 TABLETS EVERY MORNING AND TAKE 1 TABLET EVERY EVENING 03/13/17   Eustaquio Maize, MD  mirtazapine (REMERON) 15 MG tablet Take 1 tablet (15 mg total) by mouth at  bedtime. 03/04/17   Eustaquio Maize, MD  Multiple Vitamins-Minerals (PRESERVISION/LUTEIN PO) Take 1 capsule by mouth daily.    [provider]  mupirocin ointment (BACTROBAN) 2 % Use twice a day on hand 01/30/17   Eustaquio Maize, MD  nitroGLYCERIN (NITROSTAT) 0.4 MG SL tablet Place 1 tablet (0.4 mg total) under the tongue every 5 (five) minutes as needed for chest pain. 11/03/15   Rexene Alberts, MD  nystatin (MYCOSTATIN) 100000 UNIT/ML suspension Take 5 mLs (500,000 Units total) by mouth 4 (four) times daily. 03/07/17   Hassell Done Darionna-Margaret, FNP  omeprazole (PRILOSEC) 20 MG capsule TAKE 1 CAPSULE BY MOUTH TWICE A DAY 04/23/17   Terald Sleeper, PA-C  senna-docusate (SENOKOT-S) 8.6-50 MG tablet Take 1 tablet by mouth 2 (two) times daily. For constipation. 11/03/15   Rexene Alberts, MD  sodium bicarbonate 325 MG tablet Take 2 tablets (650 mg total) by mouth 3 (three) times daily. 03/04/17   Eustaquio Maize, MD  Travoprost, BAK Free, (TRAVATAN) 0.004 % SOLN ophthalmic solution Place 1 drop into both eyes at bedtime.    [provider]    Family History Family History  Problem Relation Age of Onset  . Coronary artery disease Father   . Coronary artery disease Mother   . Liver disease Mother   . Esophageal cancer Brother   . Breast cancer Sister      Social History Social History   Tobacco Use  . Smoking status: Current Every Day Smoker    Packs/day: 0.25    Years: 23.00    Pack years: 5.75    Types: Cigarettes    Start date: 07/14/1986    Last attempt to quit: 02/25/2017    Years since quitting: 0.2  . Smokeless tobacco: Never Used  Substance Use Topics  . Alcohol use: No    Alcohol/week: 0.0 oz  . Drug use: No     Allergies   Florinef [fludrocortisone acetate]; Iohexol; Sulfonamide derivatives; and Tape   Review of Systems Review of Systems  Constitutional: Negative for appetite change and fatigue.  HENT: Negative for congestion, ear discharge and sinus pressure.   Eyes: Negative for discharge.  Respiratory: Negative for cough.   Cardiovascular: Negative for chest pain.  Gastrointestinal: Positive for abdominal pain. Negative for diarrhea.  Genitourinary: Negative for frequency and hematuria.  Musculoskeletal: Negative for back pain.  Skin: Negative for rash.  Neurological: Negative for seizures and headaches.  Psychiatric/Behavioral: Negative for hallucinations.     Physical Exam Updated Vital Signs BP 131/69   Pulse 62   Temp 98.3 F (36.8 C) (Oral)   Resp 15   Ht 5\' 10"  (1.778 m)   Wt 57.2 kg (126 lb)   SpO2 98%   BMI 18.08 kg/m   Physical Exam  Constitutional: She is oriented to person, place, and time. She appears well-developed.  HENT:  Head: Normocephalic.  Eyes: Conjunctivae and EOM are normal. No scleral icterus.  Neck: Neck supple. No thyromegaly present.  Cardiovascular: Normal rate and regular rhythm. Exam reveals no gallop and no friction rub.  No murmur heard. Pulmonary/Chest: No stridor. She has no wheezes. She has no rales. She exhibits tenderness.  Abdominal: She exhibits no distension. There is tenderness. There is no rebound.  Musculoskeletal: Normal range of motion. She exhibits no edema.  Lymphadenopathy:    She has no cervical adenopathy.  Neurological: She is oriented  to person, place, and time. She exhibits normal muscle tone. Coordination normal.  Skin: No rash  noted. No erythema.  Psychiatric: She has a normal mood and affect. Her behavior is normal.     ED Treatments / Results  Labs (all labs ordered are listed, but only abnormal results are displayed) Labs Reviewed  BASIC METABOLIC PANEL - Abnormal; Notable for the following components:      Result Value   Glucose, Bld 101 (*)    BUN 39 (*)    Creatinine, Ser 2.61 (*)    Calcium 10.9 (*)    GFR calc non Af Amer 17 (*)    GFR calc Af Amer 20 (*)    All other components within normal limits  CBC - Abnormal; Notable for the following components:   RBC 2.98 (*)    Hemoglobin 9.3 (*)    HCT 30.0 (*)    MCV 100.7 (*)    All other components within normal limits  TROPONIN I    EKG  EKG Interpretation  Date/Time:  Friday May 30 2017 14:40:34 EST Ventricular Rate:  76 PR Interval:  162 QRS Duration: 90 QT Interval:  404 QTC Calculation: 454 R Axis:   77 Text Interpretation:  Normal sinus rhythm with sinus arrhythmia Normal ECG Confirmed by Milton Ferguson [phone]) on 05/30/2017 5:03:16 PM       Radiology Ct Abdomen Pelvis Wo Contrast  Result Date: 05/30/2017 CLINICAL DATA:  Chest pain pain to the right rib and abdominal area EXAM: CT CHEST, ABDOMEN AND PELVIS WITHOUT CONTRAST TECHNIQUE: Multidetector CT imaging of the chest, abdomen and pelvis was performed following the standard protocol without IV contrast. COMPARISON:  CT abdomen pelvis 10/29/2015, 09/08/2015, CT chest 09/06/2011; chest radiograph 05/30/2017 FINDINGS: CT CHEST FINDINGS Cardiovascular: Limited evaluation without intravenous contrast. Nonaneurysmal aorta. Moderate aortic atherosclerotic calcifications. Coronary artery calcification. Normal heart size. No pericardial effusion Mediastinum/Nodes: Midline trachea. No thyroid mass. 12 mm pretracheal lymph node, similar compared to 2013 CT chest. Esophagus shows a small  distal hiatal hernia Lungs/Pleura: Bronchiectasis in the left upper lobe with parenchymal scarring. Scattered scarring at the right apex. Mild bronchiectasis in the right middle lobe and bilateral lower lobes with mild mucous plugging in the left lower lobe dilated bronchi scattered ground-glass densities in the left lower lobe and right middle lobe. Negative for pneumothorax or pleural effusion Musculoskeletal: Degenerative changes of the spine. No acute or suspicious bone lesion CT ABDOMEN PELVIS FINDINGS Hepatobiliary: Interim development of multiple hypodense liver masses. Surgical clips at the gallbladder fossa. No biliary dilatation Pancreas: Atrophic.  No inflammation Spleen: Normal in size without focal abnormality. Adrenals/Urinary Tract: Adrenal glands are within normal limits. Multiple stones are present within both kidneys, the largest on the right is seen in the upper pole and measures 7 mm, the largest on the left is seen in the lower pole and measures 6 mm. Cyst in the upper pole of the left kidney. Scarring in the bilateral kidneys. No hydronephrosis. Lobulated contour of the mid right kidney. Bladder unremarkable Stomach/Bowel: The stomach is nonenlarged. No dilated small bowel. No colon wall thickening. Vascular/Lymphatic: Extensive aortic atherosclerosis. No aneurysmal dilatation. Lymph nodes adjacent to the GE junction unchanged. Multiple upper abdominal and periaortic lymph nodes, similar compared to the prior CT. 2.1 by 0.9 cm left periaortic lymph node, series 8, image number 24 is unchanged. Reproductive: Uterus is unremarkable.  No adnexal masses Other: Negative for free air or ascites. Ill-defined masslike process within the left upper quadrant of the abdomen between the spleen and upper pole of the left kidney. This is not seen on  the prior study. Musculoskeletal: Degenerative changes of the spine. No suspicious bone lesions. 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 IMPRESSION: 1. Negative for  pneumothorax or pleural effusion. No definite acute displaced rib fracture is seen. 2. Interim development of multiple ill-defined hypodense liver masses, concerning for metastatic disease. 3. Ill-defined masslike process in the left upper quadrant, between the upper pole of left kidney and spleen, uncertain if this is related to the tail of the pancreas or intraperitoneal inflammatory process. Metastatic disease is also considered given the new findings of multiple liver masses. Contrast-enhanced examination would be helpful to further evaluate. 4. Similar appearance of mediastinal, upper abdominal and retroperitoneal mild adenopathy. 5. Multifocal areas of bronchiectasis and parenchymal scarring in the lungs. Mild mucous plugging in the left lower lobe with mild ground-glass density in the right middle and left lower lobes, findings could be secondary to chronic infection such as MAI. 6. Multiple stones within the bilateral kidneys.  No hydronephrosis. Electronically Signed   By: Donavan Foil M.D.   On: 05/30/2017 18:38   Dg Chest 2 View  Result Date: 05/30/2017 CLINICAL DATA:  Bilateral lower rib pain. EXAM: CHEST  2 VIEW COMPARISON:  11/22/2015 FINDINGS: Cardiomediastinal silhouette is normal. Mediastinal contours appear intact. Calcific atherosclerotic disease of the aorta. There is no evidence of focal airspace consolidation, pleural effusion or pneumothorax. Chronic hyperinflation of the lungs with upper lobe predominant emphysematous changes and mild chronic interstitial lung changes. Bilateral apical pleural/subpleural linear and nodular thickening. Osseous structures are without acute abnormality. No displaced rib fractures are seen. Soft tissues are grossly normal. IMPRESSION: No displaced rib fractures are seen. Chronic changes of COPD. Calcific atherosclerotic disease of the aorta. Electronically Signed   By: Fidela Salisbury M.D.   On: 05/30/2017 15:33   Ct Chest Wo Contrast  Result Date:  05/30/2017 CLINICAL DATA:  Chest pain pain to the right rib and abdominal area EXAM: CT CHEST, ABDOMEN AND PELVIS WITHOUT CONTRAST TECHNIQUE: Multidetector CT imaging of the chest, abdomen and pelvis was performed following the standard protocol without IV contrast. COMPARISON:  CT abdomen pelvis 10/29/2015, 09/08/2015, CT chest 09/06/2011; chest radiograph 05/30/2017 FINDINGS: CT CHEST FINDINGS Cardiovascular: Limited evaluation without intravenous contrast. Nonaneurysmal aorta. Moderate aortic atherosclerotic calcifications. Coronary artery calcification. Normal heart size. No pericardial effusion Mediastinum/Nodes: Midline trachea. No thyroid mass. 12 mm pretracheal lymph node, similar compared to 2013 CT chest. Esophagus shows a small distal hiatal hernia Lungs/Pleura: Bronchiectasis in the left upper lobe with parenchymal scarring. Scattered scarring at the right apex. Mild bronchiectasis in the right middle lobe and bilateral lower lobes with mild mucous plugging in the left lower lobe dilated bronchi scattered ground-glass densities in the left lower lobe and right middle lobe. Negative for pneumothorax or pleural effusion Musculoskeletal: Degenerative changes of the spine. No acute or suspicious bone lesion CT ABDOMEN PELVIS FINDINGS Hepatobiliary: Interim development of multiple hypodense liver masses. Surgical clips at the gallbladder fossa. No biliary dilatation Pancreas: Atrophic.  No inflammation Spleen: Normal in size without focal abnormality. Adrenals/Urinary Tract: Adrenal glands are within normal limits. Multiple stones are present within both kidneys, the largest on the right is seen in the upper pole and measures 7 mm, the largest on the left is seen in the lower pole and measures 6 mm. Cyst in the upper pole of the left kidney. Scarring in the bilateral kidneys. No hydronephrosis. Lobulated contour of the mid right kidney. Bladder unremarkable Stomach/Bowel: The stomach is nonenlarged. No  dilated small bowel. No colon wall  thickening. Vascular/Lymphatic: Extensive aortic atherosclerosis. No aneurysmal dilatation. Lymph nodes adjacent to the GE junction unchanged. Multiple upper abdominal and periaortic lymph nodes, similar compared to the prior CT. 2.1 by 0.9 cm left periaortic lymph node, series 8, image number 24 is unchanged. Reproductive: Uterus is unremarkable.  No adnexal masses Other: Negative for free air or ascites. Ill-defined masslike process within the left upper quadrant of the abdomen between the spleen and upper pole of the left kidney. This is not seen on the prior study. Musculoskeletal: Degenerative changes of the spine. No suspicious bone lesions. 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 IMPRESSION: 1. Negative for pneumothorax or pleural effusion. No definite acute displaced rib fracture is seen. 2. Interim development of multiple ill-defined hypodense liver masses, concerning for metastatic disease. 3. Ill-defined masslike process in the left upper quadrant, between the upper pole of left kidney and spleen, uncertain if this is related to the tail of the pancreas or intraperitoneal inflammatory process. Metastatic disease is also considered given the new findings of multiple liver masses. Contrast-enhanced examination would be helpful to further evaluate. 4. Similar appearance of mediastinal, upper abdominal and retroperitoneal mild adenopathy. 5. Multifocal areas of bronchiectasis and parenchymal scarring in the lungs. Mild mucous plugging in the left lower lobe with mild ground-glass density in the right middle and left lower lobes, findings could be secondary to chronic infection such as MAI. 6. Multiple stones within the bilateral kidneys.  No hydronephrosis. Electronically Signed   By: Donavan Foil M.D.   On: 05/30/2017 18:38    Procedures Procedures (including critical care time)  Medications Ordered in ED Medications  morphine 4 MG/ML injection 4 mg (4 mg Intravenous  Given 05/30/17 1801)     Initial Impression / Assessment and Plan / ED Course  I have reviewed the triage vital signs and the nursing notes.  Pertinent labs & imaging results that were available during my care of the patient were reviewed by me and considered in my medical decision making (see chart for details).     Patient's labs show an AK I.  CT scan of the abdomen and and chest shows most likely metastatic disease in her liver with a possible mass in her abdomen patient will be admitted to medicine for further workup and treatment of the AK I. Final Clinical Impressions(s) / ED Diagnoses   Final diagnoses:  Pain of upper abdomen    ED Discharge Orders    None       Milton Ferguson, MD 05/30/17 [phone]

## 2017-05-30 NOTE — ED Notes (Signed)
To CT

## 2017-05-31 ENCOUNTER — Other Ambulatory Visit: Payer: Self-pay

## 2017-05-31 ENCOUNTER — Observation Stay (HOSPITAL_COMMUNITY): Payer: Medicare Other

## 2017-05-31 DIAGNOSIS — I951 Orthostatic hypotension: Secondary | ICD-10-CM | POA: Diagnosis not present

## 2017-05-31 DIAGNOSIS — N179 Acute kidney failure, unspecified: Secondary | ICD-10-CM | POA: Diagnosis not present

## 2017-05-31 DIAGNOSIS — R160 Hepatomegaly, not elsewhere classified: Secondary | ICD-10-CM | POA: Diagnosis not present

## 2017-05-31 DIAGNOSIS — R0789 Other chest pain: Secondary | ICD-10-CM | POA: Diagnosis not present

## 2017-05-31 DIAGNOSIS — N184 Chronic kidney disease, stage 4 (severe): Secondary | ICD-10-CM

## 2017-05-31 DIAGNOSIS — E782 Mixed hyperlipidemia: Secondary | ICD-10-CM | POA: Diagnosis not present

## 2017-05-31 DIAGNOSIS — I25119 Atherosclerotic heart disease of native coronary artery with unspecified angina pectoris: Secondary | ICD-10-CM

## 2017-05-31 DIAGNOSIS — Z720 Tobacco use: Secondary | ICD-10-CM

## 2017-05-31 DIAGNOSIS — J479 Bronchiectasis, uncomplicated: Secondary | ICD-10-CM | POA: Diagnosis not present

## 2017-05-31 DIAGNOSIS — I10 Essential (primary) hypertension: Secondary | ICD-10-CM

## 2017-05-31 DIAGNOSIS — M3500 Sicca syndrome, unspecified: Secondary | ICD-10-CM

## 2017-05-31 DIAGNOSIS — R1010 Upper abdominal pain, unspecified: Secondary | ICD-10-CM | POA: Diagnosis not present

## 2017-05-31 LAB — COMPREHENSIVE METABOLIC PANEL
ALBUMIN: 2.7 g/dL — AB (ref 3.5–5.0)
ALT: 38 U/L (ref 14–54)
AST: 58 U/L — AB (ref 15–41)
Alkaline Phosphatase: 152 U/L — ABNORMAL HIGH (ref 38–126)
Anion gap: 11 (ref 5–15)
BUN: 37 mg/dL — AB (ref 6–20)
CHLORIDE: 104 mmol/L (ref 101–111)
CO2: 22 mmol/L (ref 22–32)
CREATININE: 2.5 mg/dL — AB (ref 0.44–1.00)
Calcium: 10.1 mg/dL (ref 8.9–10.3)
GFR calc non Af Amer: 18 mL/min — ABNORMAL LOW (ref >60)
GFR, EST AFRICAN AMERICAN: 21 mL/min — AB (ref >60)
GLUCOSE: 90 mg/dL (ref 65–99)
Potassium: 3.9 mmol/L (ref 3.5–5.1)
SODIUM: 137 mmol/L (ref 135–145)
Total Bilirubin: 0.4 mg/dL (ref 0.3–1.2)
Total Protein: 6.2 g/dL — ABNORMAL LOW (ref 6.5–8.1)

## 2017-05-31 LAB — CBC WITH DIFFERENTIAL/PLATELET
BASOS ABS: 0 10*3/uL (ref 0.0–0.1)
Basophils Relative: 0 %
EOS ABS: 0.2 10*3/uL (ref 0.0–0.7)
EOS PCT: 5 %
HCT: 26.5 % — ABNORMAL LOW (ref 36.0–46.0)
HEMOGLOBIN: 8.2 g/dL — AB (ref 12.0–15.0)
Lymphocytes Relative: 29 %
Lymphs Abs: 1.2 10*3/uL (ref 0.7–4.0)
MCH: 31.2 pg (ref 26.0–34.0)
MCHC: 30.9 g/dL (ref 30.0–36.0)
MCV: 100.8 fL — ABNORMAL HIGH (ref 78.0–100.0)
Monocytes Absolute: 0.8 10*3/uL (ref 0.1–1.0)
Monocytes Relative: 19 %
NEUTROS PCT: 47 %
Neutro Abs: 2 10*3/uL (ref 1.7–7.7)
PLATELETS: 152 10*3/uL (ref 150–400)
RBC: 2.63 MIL/uL — AB (ref 3.87–5.11)
RDW: 14.5 % (ref 11.5–15.5)
WBC: 4.2 10*3/uL (ref 4.0–10.5)

## 2017-05-31 LAB — TSH: TSH: 7.106 u[IU]/mL — AB (ref 0.350–4.500)

## 2017-05-31 LAB — TROPONIN I: Troponin I: 0.03 ng/mL (ref <0.03)

## 2017-05-31 LAB — MRSA PCR SCREENING: MRSA by PCR: POSITIVE — AB

## 2017-05-31 MED ORDER — POTASSIUM CHLORIDE IN NACL 20-0.9 MEQ/L-% IV SOLN
INTRAVENOUS | Status: DC
  Administered 2017-05-31 – 2017-06-01 (×2): via INTRAVENOUS

## 2017-05-31 MED ORDER — POLYETHYLENE GLYCOL 3350 17 G PO PACK
17.0000 g | PACK | Freq: Every day | ORAL | Status: DC
  Administered 2017-05-31 – 2017-06-03 (×3): 17 g via ORAL
  Filled 2017-05-31 (×5): qty 1

## 2017-05-31 MED ORDER — MUPIROCIN 2 % EX OINT
1.0000 "application " | TOPICAL_OINTMENT | Freq: Two times a day (BID) | CUTANEOUS | Status: DC
  Administered 2017-06-01 – 2017-06-04 (×7): 1 via NASAL
  Filled 2017-05-31: qty 22

## 2017-05-31 MED ORDER — CHLORHEXIDINE GLUCONATE CLOTH 2 % EX PADS
6.0000 | MEDICATED_PAD | Freq: Every day | CUTANEOUS | Status: DC
  Administered 2017-06-01 – 2017-06-04 (×4): 6 via TOPICAL

## 2017-05-31 MED ORDER — HYDROXYCHLOROQUINE SULFATE 200 MG PO TABS
100.0000 mg | ORAL_TABLET | Freq: Every day | ORAL | Status: DC
  Administered 2017-06-01 – 2017-06-04 (×4): 100 mg via ORAL
  Filled 2017-05-31 (×4): qty 1

## 2017-05-31 MED ORDER — BISACODYL 10 MG RE SUPP
10.0000 mg | Freq: Once | RECTAL | Status: AC
  Administered 2017-05-31: 10 mg via RECTAL
  Filled 2017-05-31: qty 1

## 2017-05-31 MED ORDER — ASPIRIN 81 MG PO CHEW
81.0000 mg | CHEWABLE_TABLET | Freq: Every day | ORAL | Status: DC
  Administered 2017-05-31 – 2017-06-03 (×2): 81 mg via ORAL
  Filled 2017-05-31 (×3): qty 1

## 2017-05-31 MED ORDER — OXYCODONE-ACETAMINOPHEN 5-325 MG PO TABS
1.0000 | ORAL_TABLET | ORAL | Status: DC | PRN
  Administered 2017-05-31 – 2017-06-01 (×5): 1 via ORAL
  Filled 2017-05-31 (×5): qty 1

## 2017-05-31 MED ORDER — IPRATROPIUM-ALBUTEROL 0.5-2.5 (3) MG/3ML IN SOLN
3.0000 mL | Freq: Three times a day (TID) | RESPIRATORY_TRACT | Status: DC
  Administered 2017-05-31: 3 mL via RESPIRATORY_TRACT
  Filled 2017-05-31: qty 3

## 2017-05-31 NOTE — Progress Notes (Signed)
Patient gives permission for her sister, Tiffany [name], to get information about her when she calls.

## 2017-05-31 NOTE — Progress Notes (Signed)
Spoke with Dr. Carles Collet who requests liver biopsy as an inpatient.  Patient is tp remain hospitalized at this time.   Spoke with RN, Ander Purpura, who is aware arrangements will need to be made for transfer to [facility] Monday or Tuesday of next week once schedule better formalized.   Have made patient NPO after midnight Sunday night and will place order to hold lovenox starting tomorrow.   If patient discharges home over the weekend, will work towards biopsy as an outpatient.   Brynda Greathouse, MS RD PA-C 10:45 AM

## 2017-05-31 NOTE — Progress Notes (Signed)
Spoke with Brynda Greathouse from IR at [facility].  She says the patient will not be able to have Liver biopsy until Monday at the earliest. Also, we will not know the time to transport her until Monday.  IR will touch base with her nurse Monday morning to give a time for the pt to be transported for the liver biopsy. If they are unable to fit the patient in on Monday.  They will do the biopsy Tuesday, according to [facility].   If the pt D/C's over the weekend, the biopsy needs to be scheduled outpt.

## 2017-05-31 NOTE — Progress Notes (Addendum)
PROGRESS NOTE  Tiffany [name] HFW:[id number] DOB: [birth year] DOA: 05/30/2017 PCP: Eustaquio Maize, MD  Brief History:  77 year old female with a history of coronary artery disease, cardia myopathy, CKD stage III, COPD, tobacco abuse, orthostatic hypotension, SLE/Sjogren's, and remote history of lymphoma presenting with epigastric pain and rib pain.  The patient had a choking episode resulting in a Heimlich maneuver performed on her on 05/26/2017.  Since that period of time, the patient has been complaining of "rib pain", epigastric pain, and right upper quadrant pain without exacerbating or alleviating factors.  She denies any fevers, chills, nausea, vomiting, diarrhea, dysuria, hematuria. CT of the chest, abdomen, and pelvis revealed multiple abnormalities.  It showed LUL bronchiectasis.  There is also bronchiectasis of the bilateral lower lobes with mucous plugging.  There are scattered groundglass densities in the LLL and RML.  There was also a new hypodense liver masses and a LUQ masslike process between the spleen and left upper kidney.  Assessment/Plan: Atypical chest pain -Likely related to recent Heimlich maneuver -Troponins unremarkable so far -Finish cycling troponins -Echocardiogram -Personally reviewed EKG--sinus rhythm, no ST-T wave changes -Personally reviewed chest x-ray--no infiltrates or edema -continue ASA  Liver masses/left upper quadrant mass -Request IR for biopsy of liver masses -Check CEA and AFP -12/29/2015 colonoscopy normal  Hypercalcemia -Uncorrected calcium 10.9--there was no albumin was drawn to correct the calcium -Continue IV fluids -Intact PTH -Check 25 vitamin D -TSH  Acute on chronic renal failure--CKD stage 4 -Baseline creatinine 1.6-1.7 -Secondary to volume depletion -Continue bicarbonate -continue IVF  COPD/tobacco abuse -Approximately 15-pack-year history -Continues to smoke 3 cigarettes/day  Bronchiectasis without  exacerbation -Mucinex -Stable on room air -Start bronchodilators  Coronary artery disease -status post DES to the mid RCA in 2006.  -Cardiac catheterization from May 2017 showed widely patent stent site and otherwise minimal disease.  -Continue aspirin and statin. No active angina symptoms  Orthostatic Hypotension -currently stable on Proamatine. No syncope recently  nonischemic cardiomyopathy  -May 2017 Echo with LVEF 30-35%  Lupus/Sjogren's syndrome -Continue Plaquenil   Disposition Plan:   Home in 2-3 days  Family Communication:  No Family at bedside  Consultants:  IR  Code Status:  FULL   DVT Prophylaxis:  [place] Lovenox   Procedures: As Listed in Progress Note Above  Antibiotics: None    Subjective: Patient continues to complain of upper abdominal pain without any nausea, vomiting, diarrhea.  She states that the pain is intermittent and moderate in severity and sharp in nature.  There is no dysuria or hematuria.  She denies any shortness of breath but complains of "rib pain".  There is no headache or neck pain.  Objective: Vitals:   05/30/17 2002 05/30/17 2051 05/30/17 2112 05/31/17 0607  BP: (!) 141/67  (!) 147/65 121/62  Pulse: 68  67 62  Resp: 12  14 12   Temp:   98.3 F (36.8 C) 98.3 F (36.8 C)  TempSrc:   Oral Oral  SpO2: 100% 100% 92% 97%  Weight:      Height:        Intake/Output Summary (Last 24 hours) at 05/31/2017 0277 Last data filed at 05/31/2017 0300 Gross per 24 hour  Intake 387.2 ml  Output -  Net 387.2 ml   Weight change:  Exam:   General:  Pt is alert, follows commands appropriately, not in acute distress  HEENT: No icterus, No thrush, No neck mass, [place]/AT  Cardiovascular: RRR, S1/S2,  no rubs, no gallops  Respiratory: Bibasilar crackles but no wheezing.  Good air movement.  Abdomen: Soft/+BS, RUQ, LLQ, epigastric tender, non distended, no guarding  Extremities: No edema, No lymphangitis, No petechiae, No rashes, no  synovitis   Data Reviewed: I have personally reviewed following labs and imaging studies Basic Metabolic Panel: Recent Labs  Lab 05/30/17 1457 05/30/17 2117  NA 137  --   K 4.1  --   CL 102  --   CO2 22  --   GLUCOSE 101*  --   BUN 39*  --   CREATININE 2.61*  --   CALCIUM 10.9*  --   MG  --  2.4   Liver Function Tests: No results for input(s): AST, ALT, ALKPHOS, BILITOT, PROT, ALBUMIN in the last 168 hours. No results for input(s): LIPASE, AMYLASE in the last 168 hours. No results for input(s): AMMONIA in the last 168 hours. Coagulation Profile: No results for input(s): INR, PROTIME in the last 168 hours. CBC: Recent Labs  Lab 05/30/17 1457 05/31/17 0707  WBC 7.1 4.2  NEUTROABS  --  2.0  HGB 9.3* 8.2*  HCT 30.0* 26.5*  MCV 100.7* 100.8*  PLT 174 152   Cardiac Enzymes: Recent Labs  Lab 05/30/17 1457 05/30/17 2117  TROPONINI <0.03 0.03*   BNP: Invalid input(s): POCBNP CBG: No results for input(s): GLUCAP in the last 168 hours. HbA1C: No results for input(s): HGBA1C in the last 72 hours. Urine analysis:    Component Value Date/Time   COLORURINE YELLOW 10/28/2015 2023   APPEARANCEUR CLEAR 10/28/2015 2023   LABSPEC 1.020 10/28/2015 2023   PHURINE 6.0 10/28/2015 2023   GLUCOSEU NEGATIVE 10/28/2015 2023   HGBUR NEGATIVE 10/28/2015 2023   BILIRUBINUR NEGATIVE 10/28/2015 2023   KETONESUR NEGATIVE 10/28/2015 2023   PROTEINUR 30 (A) 10/28/2015 2023   UROBILINOGEN 0.2 11/03/2014 1100   NITRITE NEGATIVE 10/28/2015 2023   LEUKOCYTESUR TRACE (A) 10/28/2015 2023   Sepsis Labs: @LABRCNTIP (procalcitonin:4,lacticidven:4) )No results found for this or any previous visit (from the past 240 hour(s)).   Scheduled Meds: . aspirin  81 mg Oral Daily  . atorvastatin  40 mg Oral q1800  . cholecalciferol  400 Units Oral QPM  . cycloSPORINE  1 drop Both Eyes BID  . enoxaparin (LOVENOX) injection  30 mg Subcutaneous Q24H  . folic acid  1 mg Oral q morning - 10a  .  hydroxychloroquine  400 mg Oral Daily  . iron polysaccharides  150 mg Oral Daily  . latanoprost  1 drop Both Eyes QHS  . midodrine  20 mg Oral Q breakfast   And  . midodrine  10 mg Oral Q supper  . pantoprazole  40 mg Oral Daily  . senna-docusate  1 tablet Oral BID  . sodium bicarbonate  650 mg Oral TID   Continuous Infusions: . sodium chloride 88 mL/hr at 05/30/17 2236    Procedures/Studies: Ct Abdomen Pelvis Wo Contrast  Result Date: 05/30/2017 CLINICAL DATA:  Chest pain pain to the right rib and abdominal area EXAM: CT CHEST, ABDOMEN AND PELVIS WITHOUT CONTRAST TECHNIQUE: Multidetector CT imaging of the chest, abdomen and pelvis was performed following the standard protocol without IV contrast. COMPARISON:  CT abdomen pelvis 10/29/2015, 09/08/2015, CT chest 09/06/2011; chest radiograph 05/30/2017 FINDINGS: CT CHEST FINDINGS Cardiovascular: Limited evaluation without intravenous contrast. Nonaneurysmal aorta. Moderate aortic atherosclerotic calcifications. Coronary artery calcification. Normal heart size. No pericardial effusion Mediastinum/Nodes: Midline trachea. No thyroid mass. 12 mm pretracheal lymph node, similar compared to 2013  CT chest. Esophagus shows a small distal hiatal hernia Lungs/Pleura: Bronchiectasis in the left upper lobe with parenchymal scarring. Scattered scarring at the right apex. Mild bronchiectasis in the right middle lobe and bilateral lower lobes with mild mucous plugging in the left lower lobe dilated bronchi scattered ground-glass densities in the left lower lobe and right middle lobe. Negative for pneumothorax or pleural effusion Musculoskeletal: Degenerative changes of the spine. No acute or suspicious bone lesion CT ABDOMEN PELVIS FINDINGS Hepatobiliary: Interim development of multiple hypodense liver masses. Surgical clips at the gallbladder fossa. No biliary dilatation Pancreas: Atrophic.  No inflammation Spleen: Normal in size without focal abnormality.  Adrenals/Urinary Tract: Adrenal glands are within normal limits. Multiple stones are present within both kidneys, the largest on the right is seen in the upper pole and measures 7 mm, the largest on the left is seen in the lower pole and measures 6 mm. Cyst in the upper pole of the left kidney. Scarring in the bilateral kidneys. No hydronephrosis. Lobulated contour of the mid right kidney. Bladder unremarkable Stomach/Bowel: The stomach is nonenlarged. No dilated small bowel. No colon wall thickening. Vascular/Lymphatic: Extensive aortic atherosclerosis. No aneurysmal dilatation. Lymph nodes adjacent to the GE junction unchanged. Multiple upper abdominal and periaortic lymph nodes, similar compared to the prior CT. 2.1 by 0.9 cm left periaortic lymph node, series 8, image number 24 is unchanged. Reproductive: Uterus is unremarkable.  No adnexal masses Other: Negative for free air or ascites. Ill-defined masslike process within the left upper quadrant of the abdomen between the spleen and upper pole of the left kidney. This is not seen on the prior study. Musculoskeletal: Degenerative changes of the spine. No suspicious bone lesions. 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 IMPRESSION: 1. Negative for pneumothorax or pleural effusion. No definite acute displaced rib fracture is seen. 2. Interim development of multiple ill-defined hypodense liver masses, concerning for metastatic disease. 3. Ill-defined masslike process in the left upper quadrant, between the upper pole of left kidney and spleen, uncertain if this is related to the tail of the pancreas or intraperitoneal inflammatory process. Metastatic disease is also considered given the new findings of multiple liver masses. Contrast-enhanced examination would be helpful to further evaluate. 4. Similar appearance of mediastinal, upper abdominal and retroperitoneal mild adenopathy. 5. Multifocal areas of bronchiectasis and parenchymal scarring in the lungs. Mild mucous  plugging in the left lower lobe with mild ground-glass density in the right middle and left lower lobes, findings could be secondary to chronic infection such as MAI. 6. Multiple stones within the bilateral kidneys.  No hydronephrosis. Electronically Signed   By: Donavan Foil M.D.   On: 05/30/2017 18:38   Dg Chest 2 View  Result Date: 05/30/2017 CLINICAL DATA:  Bilateral lower rib pain. EXAM: CHEST  2 VIEW COMPARISON:  11/22/2015 FINDINGS: Cardiomediastinal silhouette is normal. Mediastinal contours appear intact. Calcific atherosclerotic disease of the aorta. There is no evidence of focal airspace consolidation, pleural effusion or pneumothorax. Chronic hyperinflation of the lungs with upper lobe predominant emphysematous changes and mild chronic interstitial lung changes. Bilateral apical pleural/subpleural linear and nodular thickening. Osseous structures are without acute abnormality. No displaced rib fractures are seen. Soft tissues are grossly normal. IMPRESSION: No displaced rib fractures are seen. Chronic changes of COPD. Calcific atherosclerotic disease of the aorta. Electronically Signed   By: Fidela Salisbury M.D.   On: 05/30/2017 15:33   Ct Chest Wo Contrast  Result Date: 05/30/2017 CLINICAL DATA:  Chest pain pain to the right  rib and abdominal area EXAM: CT CHEST, ABDOMEN AND PELVIS WITHOUT CONTRAST TECHNIQUE: Multidetector CT imaging of the chest, abdomen and pelvis was performed following the standard protocol without IV contrast. COMPARISON:  CT abdomen pelvis 10/29/2015, 09/08/2015, CT chest 09/06/2011; chest radiograph 05/30/2017 FINDINGS: CT CHEST FINDINGS Cardiovascular: Limited evaluation without intravenous contrast. Nonaneurysmal aorta. Moderate aortic atherosclerotic calcifications. Coronary artery calcification. Normal heart size. No pericardial effusion Mediastinum/Nodes: Midline trachea. No thyroid mass. 12 mm pretracheal lymph node, similar compared to 2013 CT chest. Esophagus  shows a small distal hiatal hernia Lungs/Pleura: Bronchiectasis in the left upper lobe with parenchymal scarring. Scattered scarring at the right apex. Mild bronchiectasis in the right middle lobe and bilateral lower lobes with mild mucous plugging in the left lower lobe dilated bronchi scattered ground-glass densities in the left lower lobe and right middle lobe. Negative for pneumothorax or pleural effusion Musculoskeletal: Degenerative changes of the spine. No acute or suspicious bone lesion CT ABDOMEN PELVIS FINDINGS Hepatobiliary: Interim development of multiple hypodense liver masses. Surgical clips at the gallbladder fossa. No biliary dilatation Pancreas: Atrophic.  No inflammation Spleen: Normal in size without focal abnormality. Adrenals/Urinary Tract: Adrenal glands are within normal limits. Multiple stones are present within both kidneys, the largest on the right is seen in the upper pole and measures 7 mm, the largest on the left is seen in the lower pole and measures 6 mm. Cyst in the upper pole of the left kidney. Scarring in the bilateral kidneys. No hydronephrosis. Lobulated contour of the mid right kidney. Bladder unremarkable Stomach/Bowel: The stomach is nonenlarged. No dilated small bowel. No colon wall thickening. Vascular/Lymphatic: Extensive aortic atherosclerosis. No aneurysmal dilatation. Lymph nodes adjacent to the GE junction unchanged. Multiple upper abdominal and periaortic lymph nodes, similar compared to the prior CT. 2.1 by 0.9 cm left periaortic lymph node, series 8, image number 24 is unchanged. Reproductive: Uterus is unremarkable.  No adnexal masses Other: Negative for free air or ascites. Ill-defined masslike process within the left upper quadrant of the abdomen between the spleen and upper pole of the left kidney. This is not seen on the prior study. Musculoskeletal: Degenerative changes of the spine. No suspicious bone lesions. 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 IMPRESSION: 1.  Negative for pneumothorax or pleural effusion. No definite acute displaced rib fracture is seen. 2. Interim development of multiple ill-defined hypodense liver masses, concerning for metastatic disease. 3. Ill-defined masslike process in the left upper quadrant, between the upper pole of left kidney and spleen, uncertain if this is related to the tail of the pancreas or intraperitoneal inflammatory process. Metastatic disease is also considered given the new findings of multiple liver masses. Contrast-enhanced examination would be helpful to further evaluate. 4. Similar appearance of mediastinal, upper abdominal and retroperitoneal mild adenopathy. 5. Multifocal areas of bronchiectasis and parenchymal scarring in the lungs. Mild mucous plugging in the left lower lobe with mild ground-glass density in the right middle and left lower lobes, findings could be secondary to chronic infection such as MAI. 6. Multiple stones within the bilateral kidneys.  No hydronephrosis. Electronically Signed   By: Donavan Foil M.D.   On: 05/30/2017 18:38    Orson Eva, DO  [facility] Pager [phone]  If 7PM-7AM, please contact night-coverage [email or web address] Password [email or web address] 05/31/2017, 8:22 AM   LOS: 0 days

## 2017-06-01 ENCOUNTER — Observation Stay (HOSPITAL_BASED_OUTPATIENT_CLINIC_OR_DEPARTMENT_OTHER): Payer: Medicare Other

## 2017-06-01 DIAGNOSIS — R0789 Other chest pain: Secondary | ICD-10-CM | POA: Diagnosis not present

## 2017-06-01 DIAGNOSIS — J479 Bronchiectasis, uncomplicated: Secondary | ICD-10-CM | POA: Diagnosis not present

## 2017-06-01 DIAGNOSIS — I340 Nonrheumatic mitral (valve) insufficiency: Secondary | ICD-10-CM

## 2017-06-01 DIAGNOSIS — R1010 Upper abdominal pain, unspecified: Secondary | ICD-10-CM | POA: Diagnosis not present

## 2017-06-01 DIAGNOSIS — E782 Mixed hyperlipidemia: Secondary | ICD-10-CM

## 2017-06-01 DIAGNOSIS — N179 Acute kidney failure, unspecified: Secondary | ICD-10-CM | POA: Diagnosis not present

## 2017-06-01 DIAGNOSIS — I10 Essential (primary) hypertension: Secondary | ICD-10-CM | POA: Diagnosis not present

## 2017-06-01 LAB — ECHOCARDIOGRAM COMPLETE
AVLVOTPG: 7 mmHg
CHL CUP DOP CALC LVOT VTI: 28.9 cm
CHL CUP MV DEC (S): 232
E decel time: 232 msec
E/e' ratio: 10.87
FS: 43 % (ref 28–44)
Height: 70 in
IV/PV OW: 1.05
LA diam index: 2.1 cm/m2
LA vol A4C: 106 ml
LA vol: 83.8 mL
LASIZE: 35 mm
LAVOLIN: 50.3 mL/m2
LEFT ATRIUM END SYS DIAM: 35 mm
LV E/e'average: 10.87
LV PW d: 10.5 mm — AB (ref 0.6–1.1)
LV TDI E'LATERAL: 8.59
LV TDI E'MEDIAL: 8.92
LV dias vol index: 47 mL/m2
LV dias vol: 79 mL (ref 46–106)
LV sys vol index: 20 mL/m2
LV sys vol: 34 mL (ref 14–42)
LVEEMED: 10.87
LVELAT: 8.59 cm/s
LVOT area: 2.54 cm2
LVOT peak vel: 135 cm/s
LVOTD: 18 mm
LVOTSV: 73 mL
Lateral S' vel: 15 cm/s
MV pk E vel: 93.4 m/s
MVPG: 3 mmHg
MVPKAVEL: 88.3 m/s
RV TAPSE: 27.7 mm
RV sys press: 30 mmHg
Reg peak vel: 259 cm/s
Simpson's disk: 57
Stroke v: 45 ml
TRMAXVEL: 259 cm/s
Weight: 2016 oz

## 2017-06-01 LAB — PARATHYROID HORMONE, INTACT (NO CA): PTH: 11 pg/mL — AB (ref 15–65)

## 2017-06-01 LAB — COMPREHENSIVE METABOLIC PANEL
ALBUMIN: 2.6 g/dL — AB (ref 3.5–5.0)
ALK PHOS: 141 U/L — AB (ref 38–126)
ALT: 43 U/L (ref 14–54)
AST: 62 U/L — AB (ref 15–41)
Anion gap: 11 (ref 5–15)
BILIRUBIN TOTAL: 0.4 mg/dL (ref 0.3–1.2)
BUN: 34 mg/dL — AB (ref 6–20)
CALCIUM: 10.1 mg/dL (ref 8.9–10.3)
CO2: 21 mmol/L — ABNORMAL LOW (ref 22–32)
CREATININE: 2.5 mg/dL — AB (ref 0.44–1.00)
Chloride: 105 mmol/L (ref 101–111)
GFR calc Af Amer: 21 mL/min — ABNORMAL LOW (ref >60)
GFR, EST NON AFRICAN AMERICAN: 18 mL/min — AB (ref >60)
GLUCOSE: 87 mg/dL (ref 65–99)
Potassium: 4.6 mmol/L (ref 3.5–5.1)
Sodium: 137 mmol/L (ref 135–145)
TOTAL PROTEIN: 6.3 g/dL — AB (ref 6.5–8.1)

## 2017-06-01 LAB — AFP TUMOR MARKER: AFP, SERUM, TUMOR MARKER: 2.5 ng/mL (ref 0.0–8.3)

## 2017-06-01 LAB — CEA: CEA: 3.7 ng/mL (ref 0.0–4.7)

## 2017-06-01 LAB — GLUCOSE, CAPILLARY: GLUCOSE-CAPILLARY: 93 mg/dL (ref 65–99)

## 2017-06-01 MED ORDER — OXYCODONE-ACETAMINOPHEN 5-325 MG PO TABS
2.0000 | ORAL_TABLET | ORAL | Status: DC | PRN
  Administered 2017-06-01 – 2017-06-04 (×11): 2 via ORAL
  Filled 2017-06-01 (×11): qty 2

## 2017-06-01 MED ORDER — SODIUM CHLORIDE 0.9 % IV SOLN
INTRAVENOUS | Status: DC
  Administered 2017-06-01 – 2017-06-03 (×7): via INTRAVENOUS

## 2017-06-01 NOTE — Progress Notes (Addendum)
PROGRESS NOTE  Tiffany [name] HER:[id number] DOB: Nov 27, 1940 DOA: 05/30/2017 PCP: Eustaquio Maize, MD   Brief History:  77 year old female with a history of coronary artery disease, cardia myopathy, CKD stage III, COPD, tobacco abuse, orthostatic hypotension, SLE/Sjogren's, and remote history of lymphoma presenting with epigastric pain and rib pain.  The patient had a choking episode resulting in a Heimlich maneuver performed on her on 05/26/2017.  Since that period of time, the patient has been complaining of "rib pain", epigastric pain, and right upper quadrant pain without exacerbating or alleviating factors.  She denies any fevers, chills, nausea, vomiting, diarrhea, dysuria, hematuria. CT of the chest, abdomen, and pelvis revealed multiple abnormalities.  It showed LUL bronchiectasis.  There is also bronchiectasis of the bilateral lower lobes with mucous plugging.  There are scattered groundglass densities in the LLL and RML.  There was also a new hypodense liver masses and a LUQ masslike process between the spleen and left upper kidney.  Assessment/Plan: Atypical chest pain -Likely related to recent Heimlich maneuver -Troponins unremarkable so far -Finish cycling troponins--flat at 0.03 -Echocardiogram -Personally reviewed EKG--sinus rhythm, no ST-T wave changes -Personally reviewed chest x-ray--no infiltrates or edema -continue ASA  Liver masses/left upper quadrant mass -Request IR for biopsy of liver masses--tentatively 06/02/17 -Check CEA and AFP--both unremarkable -12/29/2015 colonoscopy normal  Hypercalcemia--nonparathyroid mediated -Corrected calcium 11.9 at time of admission -Continue IV fluids-increase rate to 125cc/hr -Intact PTH--11 -Check 25 vitamin D -TSH--7.106 -check PTH-rp -check 1,25-vitamin D -d/c vitamin D supplement  Acute on chronic renal failure--CKD stage 4 -Baseline creatinine 1.6-1.7 -Secondary to volume depletion -Continue  bicarbonate -no improvement in last 24 hours -continue IVF--increase rate to 125cc/hr -renal US  RUQ Abdominal pain -likely related to liver masses -increase percocet to 2 tabs q 4 hours prn -start IV dilaudid for severe breakthrough pain if percocet does not help  COPD/tobacco abuse -Approximately 15-pack-year history -Continues to smoke 3 cigarettes/day  Bronchiectasis without exacerbation -Mucinex -Stable on room air -Start bronchodilators  Coronary artery disease -status post DES to the mid RCA in 2006.  -Cardiac catheterization from May 2017 showed widely patent stent site and otherwise minimal disease.  -Continue aspirin and statin. No active angina symptoms  Orthostatic Hypotension -currently stable on Proamatine. No syncope recently  nonischemic cardiomyopathy  -May 2017 Echo with LVEF 30-35%  Lupus/Sjogren's syndrome -Continue Plaquenil   Disposition Plan:   Home in 1-2  days  Family Communication: Brother updated at bedside; sister updated on phone --Total time spent 35 minutes.  Greater than 50% spent face to face counseling and coordinating care.   Consultants:  IR  Code Status:  FULL   DVT Prophylaxis:  [place] Lovenox   Procedures: As Listed in Progress Note Above  Antibiotics: None      Subjective: Patient complains of right upper quadrant pain, but she denies any fevers, chills, chest pain, shortness of breath, nausea, vomiting, diarrhea.  She states that the opioids only helped for about 2 hours.  She denies any coughing, hemoptysis,  Objective: Vitals:   05/31/17 2056 06/01/17 0255 06/01/17 0630 06/01/17 1300  BP: 127/60  (!) 120/53 128/69  Pulse: 85  73 77  Resp: 16  16 19   Temp: 98.5 F (36.9 C)  98.8 F (37.1 C) 98.3 F (36.8 C)  TempSrc: Oral  Oral Oral  SpO2: 97% 95% 96% 97%  Weight:      Height:  Intake/Output Summary (Last 24 hours) at 06/01/2017 1622 Last data filed at 06/01/2017 1500 Gross per 24 hour   Intake 2606.25 ml  Output 1550 ml  Net 1056.25 ml   Weight change:  Exam:   General:  Pt is alert, follows commands appropriately, not in acute distress  HEENT: No icterus, No thrush, No neck mass, [place]/AT  Cardiovascular: RRR, S1/S2, no rubs, no gallops  Respiratory: Bibasilar rales.  Diminished breath sounds at the bases.  No wheezing.  Abdomen: Soft/+BS, RUQ tender, non distended, no guarding  Extremities: No edema, No lymphangitis, No petechiae, No rashes, no synovitis   Data Reviewed: I have personally reviewed following labs and imaging studies Basic Metabolic Panel: Recent Labs  Lab 05/30/17 1457 05/30/17 2117 05/31/17 0707 06/01/17 0610  NA 137  --  137 137  K 4.1  --  3.9 4.6  CL 102  --  104 105  CO2 22  --  22 21*  GLUCOSE 101*  --  90 87  BUN 39*  --  37* 34*  CREATININE 2.61*  --  2.50* 2.50*  CALCIUM 10.9*  --  10.1 10.1  MG  --  2.4  --   --    Liver Function Tests: Recent Labs  Lab 05/31/17 0707 06/01/17 0610  AST 58* 62*  ALT 38 43  ALKPHOS 152* 141*  BILITOT 0.4 0.4  PROT 6.2* 6.3*  ALBUMIN 2.7* 2.6*   No results for input(s): LIPASE, AMYLASE in the last 168 hours. No results for input(s): AMMONIA in the last 168 hours. Coagulation Profile: No results for input(s): INR, PROTIME in the last 168 hours. CBC: Recent Labs  Lab 05/30/17 1457 05/31/17 0707  WBC 7.1 4.2  NEUTROABS  --  2.0  HGB 9.3* 8.2*  HCT 30.0* 26.5*  MCV 100.7* 100.8*  PLT 174 152   Cardiac Enzymes: Recent Labs  Lab 05/30/17 1457 05/30/17 2117 05/31/17 0707  TROPONINI <0.03 0.03* 0.03*   BNP: Invalid input(s): POCBNP CBG: Recent Labs  Lab 06/01/17 0834  GLUCAP 93   HbA1C: No results for input(s): HGBA1C in the last 72 hours. Urine analysis:    Component Value Date/Time   COLORURINE YELLOW 10/28/2015 2023   APPEARANCEUR CLEAR 10/28/2015 2023   LABSPEC 1.020 10/28/2015 2023   PHURINE 6.0 10/28/2015 2023   GLUCOSEU NEGATIVE 10/28/2015 2023    HGBUR NEGATIVE 10/28/2015 2023   BILIRUBINUR NEGATIVE 10/28/2015 2023   KETONESUR NEGATIVE 10/28/2015 2023   PROTEINUR 30 (A) 10/28/2015 2023   UROBILINOGEN 0.2 11/03/2014 1100   NITRITE NEGATIVE 10/28/2015 2023   LEUKOCYTESUR TRACE (A) 10/28/2015 2023   Sepsis Labs: @LABRCNTIP (procalcitonin:4,lacticidven:4) ) Recent Results (from the past 240 hour(s))  MRSA PCR Screening     Status: Abnormal   Collection Time: 05/31/17  9:06 PM  Result Value Ref Range Status   MRSA by PCR POSITIVE (A) NEGATIVE Final    Comment: RESULT CALLED TO, READ BACK BY AND VERIFIED WITH: LIGHTNER,N @ 2336 ON 05/31/17 BY JUW        The GeneXpert MRSA Assay (FDA approved for NASAL specimens only), is one component of a comprehensive MRSA colonization surveillance program. It is not intended to diagnose MRSA infection nor to guide or monitor treatment for MRSA infections.      Scheduled Meds: . aspirin  81 mg Oral Daily  . atorvastatin  40 mg Oral q1800  . Chlorhexidine Gluconate Cloth  6 each Topical Q0600  . cycloSPORINE  1 drop Both Eyes BID  .  folic acid  1 mg Oral q morning - 10a  . hydroxychloroquine  100 mg Oral Daily  . iron polysaccharides  150 mg Oral Daily  . latanoprost  1 drop Both Eyes QHS  . midodrine  20 mg Oral Q breakfast   And  . midodrine  10 mg Oral Q supper  . mupirocin ointment  1 application Nasal BID  . pantoprazole  40 mg Oral Daily  . polyethylene glycol  17 g Oral Daily  . senna-docusate  1 tablet Oral BID  . sodium bicarbonate  650 mg Oral TID   Continuous Infusions: . sodium chloride 125 mL/hr at 06/01/17 1327    Procedures/Studies: Ct Abdomen Pelvis Wo Contrast  Result Date: 05/30/2017 CLINICAL DATA:  Chest pain pain to the right rib and abdominal area EXAM: CT CHEST, ABDOMEN AND PELVIS WITHOUT CONTRAST TECHNIQUE: Multidetector CT imaging of the chest, abdomen and pelvis was performed following the standard protocol without IV contrast. COMPARISON:  CT abdomen  pelvis 10/29/2015, 09/08/2015, CT chest 09/06/2011; chest radiograph 05/30/2017 FINDINGS: CT CHEST FINDINGS Cardiovascular: Limited evaluation without intravenous contrast. Nonaneurysmal aorta. Moderate aortic atherosclerotic calcifications. Coronary artery calcification. Normal heart size. No pericardial effusion Mediastinum/Nodes: Midline trachea. No thyroid mass. 12 mm pretracheal lymph node, similar compared to 2013 CT chest. Esophagus shows a small distal hiatal hernia Lungs/Pleura: Bronchiectasis in the left upper lobe with parenchymal scarring. Scattered scarring at the right apex. Mild bronchiectasis in the right middle lobe and bilateral lower lobes with mild mucous plugging in the left lower lobe dilated bronchi scattered ground-glass densities in the left lower lobe and right middle lobe. Negative for pneumothorax or pleural effusion Musculoskeletal: Degenerative changes of the spine. No acute or suspicious bone lesion CT ABDOMEN PELVIS FINDINGS Hepatobiliary: Interim development of multiple hypodense liver masses. Surgical clips at the gallbladder fossa. No biliary dilatation Pancreas: Atrophic.  No inflammation Spleen: Normal in size without focal abnormality. Adrenals/Urinary Tract: Adrenal glands are within normal limits. Multiple stones are present within both kidneys, the largest on the right is seen in the upper pole and measures 7 mm, the largest on the left is seen in the lower pole and measures 6 mm. Cyst in the upper pole of the left kidney. Scarring in the bilateral kidneys. No hydronephrosis. Lobulated contour of the mid right kidney. Bladder unremarkable Stomach/Bowel: The stomach is nonenlarged. No dilated small bowel. No colon wall thickening. Vascular/Lymphatic: Extensive aortic atherosclerosis. No aneurysmal dilatation. Lymph nodes adjacent to the GE junction unchanged. Multiple upper abdominal and periaortic lymph nodes, similar compared to the prior CT. 2.1 by 0.9 cm left periaortic  lymph node, series 8, image number 24 is unchanged. Reproductive: Uterus is unremarkable.  No adnexal masses Other: Negative for free air or ascites. Ill-defined masslike process within the left upper quadrant of the abdomen between the spleen and upper pole of the left kidney. This is not seen on the prior study. Musculoskeletal: Degenerative changes of the spine. No suspicious bone lesions. 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 IMPRESSION: 1. Negative for pneumothorax or pleural effusion. No definite acute displaced rib fracture is seen. 2. Interim development of multiple ill-defined hypodense liver masses, concerning for metastatic disease. 3. Ill-defined masslike process in the left upper quadrant, between the upper pole of left kidney and spleen, uncertain if this is related to the tail of the pancreas or intraperitoneal inflammatory process. Metastatic disease is also considered given the new findings of multiple liver masses. Contrast-enhanced examination would be helpful to further evaluate. 4.  Similar appearance of mediastinal, upper abdominal and retroperitoneal mild adenopathy. 5. Multifocal areas of bronchiectasis and parenchymal scarring in the lungs. Mild mucous plugging in the left lower lobe with mild ground-glass density in the right middle and left lower lobes, findings could be secondary to chronic infection such as MAI. 6. Multiple stones within the bilateral kidneys.  No hydronephrosis. Electronically Signed   By: Donavan Foil M.D.   On: 05/30/2017 18:38   Dg Chest 2 View  Result Date: 05/30/2017 CLINICAL DATA:  Bilateral lower rib pain. EXAM: CHEST  2 VIEW COMPARISON:  11/22/2015 FINDINGS: Cardiomediastinal silhouette is normal. Mediastinal contours appear intact. Calcific atherosclerotic disease of the aorta. There is no evidence of focal airspace consolidation, pleural effusion or pneumothorax. Chronic hyperinflation of the lungs with upper lobe predominant emphysematous changes and mild  chronic interstitial lung changes. Bilateral apical pleural/subpleural linear and nodular thickening. Osseous structures are without acute abnormality. No displaced rib fractures are seen. Soft tissues are grossly normal. IMPRESSION: No displaced rib fractures are seen. Chronic changes of COPD. Calcific atherosclerotic disease of the aorta. Electronically Signed   By: Fidela Salisbury M.D.   On: 05/30/2017 15:33   Ct Chest Wo Contrast  Result Date: 05/30/2017 CLINICAL DATA:  Chest pain pain to the right rib and abdominal area EXAM: CT CHEST, ABDOMEN AND PELVIS WITHOUT CONTRAST TECHNIQUE: Multidetector CT imaging of the chest, abdomen and pelvis was performed following the standard protocol without IV contrast. COMPARISON:  CT abdomen pelvis 10/29/2015, 09/08/2015, CT chest 09/06/2011; chest radiograph 05/30/2017 FINDINGS: CT CHEST FINDINGS Cardiovascular: Limited evaluation without intravenous contrast. Nonaneurysmal aorta. Moderate aortic atherosclerotic calcifications. Coronary artery calcification. Normal heart size. No pericardial effusion Mediastinum/Nodes: Midline trachea. No thyroid mass. 12 mm pretracheal lymph node, similar compared to 2013 CT chest. Esophagus shows a small distal hiatal hernia Lungs/Pleura: Bronchiectasis in the left upper lobe with parenchymal scarring. Scattered scarring at the right apex. Mild bronchiectasis in the right middle lobe and bilateral lower lobes with mild mucous plugging in the left lower lobe dilated bronchi scattered ground-glass densities in the left lower lobe and right middle lobe. Negative for pneumothorax or pleural effusion Musculoskeletal: Degenerative changes of the spine. No acute or suspicious bone lesion CT ABDOMEN PELVIS FINDINGS Hepatobiliary: Interim development of multiple hypodense liver masses. Surgical clips at the gallbladder fossa. No biliary dilatation Pancreas: Atrophic.  No inflammation Spleen: Normal in size without focal abnormality.  Adrenals/Urinary Tract: Adrenal glands are within normal limits. Multiple stones are present within both kidneys, the largest on the right is seen in the upper pole and measures 7 mm, the largest on the left is seen in the lower pole and measures 6 mm. Cyst in the upper pole of the left kidney. Scarring in the bilateral kidneys. No hydronephrosis. Lobulated contour of the mid right kidney. Bladder unremarkable Stomach/Bowel: The stomach is nonenlarged. No dilated small bowel. No colon wall thickening. Vascular/Lymphatic: Extensive aortic atherosclerosis. No aneurysmal dilatation. Lymph nodes adjacent to the GE junction unchanged. Multiple upper abdominal and periaortic lymph nodes, similar compared to the prior CT. 2.1 by 0.9 cm left periaortic lymph node, series 8, image number 24 is unchanged. Reproductive: Uterus is unremarkable.  No adnexal masses Other: Negative for free air or ascites. Ill-defined masslike process within the left upper quadrant of the abdomen between the spleen and upper pole of the left kidney. This is not seen on the prior study. Musculoskeletal: Degenerative changes of the spine. No suspicious bone lesions. 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  IMPRESSION: 1. Negative for pneumothorax or pleural effusion. No definite acute displaced rib fracture is seen. 2. Interim development of multiple ill-defined hypodense liver masses, concerning for metastatic disease. 3. Ill-defined masslike process in the left upper quadrant, between the upper pole of left kidney and spleen, uncertain if this is related to the tail of the pancreas or intraperitoneal inflammatory process. Metastatic disease is also considered given the new findings of multiple liver masses. Contrast-enhanced examination would be helpful to further evaluate. 4. Similar appearance of mediastinal, upper abdominal and retroperitoneal mild adenopathy. 5. Multifocal areas of bronchiectasis and parenchymal scarring in the lungs. Mild mucous  plugging in the left lower lobe with mild ground-glass density in the right middle and left lower lobes, findings could be secondary to chronic infection such as MAI. 6. Multiple stones within the bilateral kidneys.  No hydronephrosis. Electronically Signed   By: Donavan Foil M.D.   On: 05/30/2017 18:38    Orson Eva, DO  [facility] Pager [phone]  If 7PM-7AM, please contact night-coverage [email or web address] Password [email or web address] 06/01/2017, 4:22 PM   LOS: 0 days

## 2017-06-01 NOTE — Progress Notes (Signed)
*  PRELIMINARY RESULTS* Echocardiogram 2D Echocardiogram has been performed.  Tiffany [name] 06/01/2017, 2:23 PM

## 2017-06-02 ENCOUNTER — Ambulatory Visit: Payer: Medicare Other | Admitting: Pediatrics

## 2017-06-02 ENCOUNTER — Observation Stay (HOSPITAL_COMMUNITY): Payer: Medicare Other

## 2017-06-02 DIAGNOSIS — N179 Acute kidney failure, unspecified: Secondary | ICD-10-CM | POA: Diagnosis not present

## 2017-06-02 DIAGNOSIS — R1010 Upper abdominal pain, unspecified: Secondary | ICD-10-CM | POA: Diagnosis not present

## 2017-06-02 DIAGNOSIS — R160 Hepatomegaly, not elsewhere classified: Secondary | ICD-10-CM | POA: Diagnosis not present

## 2017-06-02 DIAGNOSIS — I10 Essential (primary) hypertension: Secondary | ICD-10-CM | POA: Diagnosis not present

## 2017-06-02 DIAGNOSIS — J479 Bronchiectasis, uncomplicated: Secondary | ICD-10-CM | POA: Diagnosis not present

## 2017-06-02 LAB — COMPREHENSIVE METABOLIC PANEL
ALT: 60 U/L — ABNORMAL HIGH (ref 14–54)
ANION GAP: 8 (ref 5–15)
AST: 85 U/L — ABNORMAL HIGH (ref 15–41)
Albumin: 2.7 g/dL — ABNORMAL LOW (ref 3.5–5.0)
Alkaline Phosphatase: 144 U/L — ABNORMAL HIGH (ref 38–126)
BUN: 30 mg/dL — ABNORMAL HIGH (ref 6–20)
CO2: 20 mmol/L — AB (ref 22–32)
CREATININE: 2.33 mg/dL — AB (ref 0.44–1.00)
Calcium: 9.9 mg/dL (ref 8.9–10.3)
Chloride: 107 mmol/L (ref 101–111)
GFR, EST AFRICAN AMERICAN: 22 mL/min — AB (ref >60)
GFR, EST NON AFRICAN AMERICAN: 19 mL/min — AB (ref >60)
Glucose, Bld: 77 mg/dL (ref 65–99)
POTASSIUM: 5.1 mmol/L (ref 3.5–5.1)
SODIUM: 135 mmol/L (ref 135–145)
Total Bilirubin: 0.7 mg/dL (ref 0.3–1.2)
Total Protein: 6.5 g/dL (ref 6.5–8.1)

## 2017-06-02 LAB — PROTIME-INR
INR: 1.11
PROTHROMBIN TIME: 14.2 s (ref 11.4–15.2)

## 2017-06-02 LAB — T4, FREE: Free T4: 0.88 ng/dL (ref 0.61–1.12)

## 2017-06-02 LAB — SURGICAL PCR SCREEN
MRSA, PCR: POSITIVE — AB
Staphylococcus aureus: POSITIVE — AB

## 2017-06-02 LAB — VITAMIN D 25 HYDROXY (VIT D DEFICIENCY, FRACTURES): Vit D, 25-Hydroxy: 35.2 ng/mL (ref 30.0–100.0)

## 2017-06-02 MED ORDER — MUPIROCIN 2 % EX OINT: 1.0000 "application " | TOPICAL_OINTMENT | Freq: Two times a day (BID) | CUTANEOUS | Status: DC

## 2017-06-02 MED ORDER — CHLORHEXIDINE GLUCONATE CLOTH 2 % EX PADS: 6.0000 | MEDICATED_PAD | Freq: Every day | CUTANEOUS | Status: DC

## 2017-06-02 MED ORDER — POLYETHYLENE GLYCOL 3350 17 G PO PACK: 17.0000 g | PACK | Freq: Every day | ORAL | 0 refills | Status: DC

## 2017-06-02 NOTE — Progress Notes (Signed)
PT NPO since 2345 06/01/17. CHG wipe provided and administered at 0600. PT able to void and have small bowel movement. MRSA positive. IR to call with time and travel to be arranged once called. NS infusing. Blood thinners held for procedure.

## 2017-06-02 NOTE — Progress Notes (Signed)
PROGRESS NOTE  Tiffany [name] JAS:[id number] DOB: [birth year] DOA: 05/30/2017 PCP: Eustaquio Maize, MD Brief History: 77 year old female with a history of coronary artery disease, cardia myopathy, CKD stage III, COPD, tobacco abuse, orthostatic hypotension, SLE/Sjogren's, and remote history of lymphoma presenting with epigastric pain and rib pain. The patient had a choking episode resulting in a Heimlich maneuver performed on her on 05/26/2017. Since that period of time, the patient has been complaining of "rib pain", epigastric pain, and right upper quadrant pain without exacerbating or alleviating factors. She denies any fevers, chills, nausea, vomiting, diarrhea, dysuria, hematuria. CT of the chest, abdomen, and pelvis revealed multiple abnormalities. It showed LULbronchiectasis. There is also bronchiectasis of the bilateral lower lobes with mucous plugging. There are scattered groundglass densities in the LLL and RML.There was also a new hypodense liver masses and aLUQmasslike process between the spleen and left upper kidney.  Assessment/Plan: Atypical chest pain -Likely related to recent Heimlich maneuver -Troponins unremarkable so far -Finish cycling troponins--flat at 0.03 -Echocardiogram--EF 55%, grade 2 DD, no WMA, mild MR -Personally reviewed EKG--sinus rhythm, no ST-T wave changes -Personally reviewed chest x-ray--no infiltrates or edema -continue ASA  Liver masses/left upper quadrant mass -Request IR for biopsy of liver masses--tentatively 06/03/17 -Check CEA and AFP--both unremarkable -8/18/2017colonoscopy normal -06/02/2017 MR abdomen--left retroperitoneal soft tissue mass at the pancreatic tail; numerous T2 hyperintense liver masses, likely metastasis  Hypercalcemia--nonparathyroid mediated -Corrected calcium 11.9 at time of admission -Continue IV fluids-increased rate to 125cc/hr -Intact PTH--11 -Check 25 vitamin D--35 -TSH--7.106; Free T4  0.88 -check PTH-rp -check 1,25-vitamin D -d/c vitamin D supplement  Acute on chronic renal failure--CKD stage4 -Baseline creatinine 1.6-1.7 -Secondary to volume depletion -Continue bicarbonate -no improvement in last 24 hours -continue IVF--increase rate to 125cc/hr -renal us--no hydronephrosis  RUQ Abdominal pain -likely related to liver masses -increase percocet to 2 tabs q 4 hours prn -start IV dilaudid for severe breakthrough pain if percocet does not help  COPD/tobacco abuse -Approximately 15-pack-year history -Continues to smoke 3 cigarettes/day  Bronchiectasis without exacerbation -Mucinex -Stable on room air -Start bronchodilators  Coronary artery disease -status post DES to the mid RCA in 2006. -Cardiac catheterization from May 2017 showed widely patent stent site and otherwise minimal disease. -Continue aspirin and statin. No active angina symptoms  Orthostatic Hypotension -currently stable on Proamatine. No syncope recently  nonischemic cardiomyopathy -May 2017 Ech[place] LVEF 30-35%  Lupus/Sjogren's syndrome -Continue Plaquenil   Disposition Plan: Home after liver biopsy if stable Family Communication:Daughter updated at bedside   Consultants:IR  Code Status: FULL   DVT Prophylaxis: [place] Lovenox   Procedures: As Listed in Progress Note Above  Antibiotics: None        Subjective: Patient complains of right upper quadrant pain but it is better controlled with increasing to 2 Percocets every 4 hours.  She denies any nausea, vomiting, diarrhea, dysuria, hematuria..  She feels hungry.  She denies any fevers, chills, chest pain.  Objective: Vitals:   06/01/17 0630 06/01/17 1300 06/01/17 2207 06/02/17 0455  BP: (!) 120/53 128/69 131/64 131/61  Pulse: 73 77 63 81  Resp: 16 19 18 16   Temp: 98.8 F (37.1 C) 98.3 F (36.8 C) 98.1 F (36.7 C) 99 F (37.2 C)  TempSrc: Oral Oral Oral Oral  SpO2: 96% 97% 99% 93%   Weight:      Height:        Intake/Output Summary (Last 24 hours) at 06/02/2017 1720 Last data  filed at 06/02/2017 0820 Gross per 24 hour  Intake 2269.58 ml  Output 1400 ml  Net 869.58 ml   Weight change:  Exam:   General:  Pt is alert, follows commands appropriately, not in acute distress  HEENT: No icterus, No thrush, No neck mass, [place]/AT  Cardiovascular: RRR, S1/S2, no rubs, no gallops  Respiratory: Bibasilar crackles.  Good air movement.  No wheezing.  Abdomen: Soft/+BS, non tender, non distended, no guarding  Extremities: No edema, No lymphangitis, No petechiae, No rashes, no synovitis   Data Reviewed: I have personally reviewed following labs and imaging studies Basic Metabolic Panel: Recent Labs  Lab 05/30/17 1457 05/30/17 2117 05/31/17 0707 06/01/17 0610 06/02/17 0448  NA 137  --  137 137 135  K 4.1  --  3.9 4.6 5.1  CL 102  --  104 105 107  CO2 22  --  22 21* 20*  GLUCOSE 101*  --  90 87 77  BUN 39*  --  37* 34* 30*  CREATININE 2.61*  --  2.50* 2.50* 2.33*  CALCIUM 10.9*  --  10.1 10.1 9.9  MG  --  2.4  --   --   --    Liver Function Tests: Recent Labs  Lab 05/31/17 0707 06/01/17 0610 06/02/17 0448  AST 58* 62* 85*  ALT 38 43 60*  ALKPHOS 152* 141* 144*  BILITOT 0.4 0.4 0.7  PROT 6.2* 6.3* 6.5  ALBUMIN 2.7* 2.6* 2.7*   No results for input(s): LIPASE, AMYLASE in the last 168 hours. No results for input(s): AMMONIA in the last 168 hours. Coagulation Profile: Recent Labs  Lab 06/02/17 1235  INR 1.11   CBC: Recent Labs  Lab 05/30/17 1457 05/31/17 0707  WBC 7.1 4.2  NEUTROABS  --  2.0  HGB 9.3* 8.2*  HCT 30.0* 26.5*  MCV 100.7* 100.8*  PLT 174 152   Cardiac Enzymes: Recent Labs  Lab 05/30/17 1457 05/30/17 2117 05/31/17 0707  TROPONINI <0.03 0.03* 0.03*   BNP: Invalid input(s): POCBNP CBG: Recent Labs  Lab 06/01/17 0834  GLUCAP 93   HbA1C: No results for input(s): HGBA1C in the last 72 hours. Urine analysis:     Component Value Date/Time   COLORURINE YELLOW 10/28/2015 2023   APPEARANCEUR CLEAR 10/28/2015 2023   LABSPEC 1.020 10/28/2015 2023   PHURINE 6.0 10/28/2015 2023   GLUCOSEU NEGATIVE 10/28/2015 2023   HGBUR NEGATIVE 10/28/2015 2023   BILIRUBINUR NEGATIVE 10/28/2015 2023   KETONESUR NEGATIVE 10/28/2015 2023   PROTEINUR 30 (A) 10/28/2015 2023   UROBILINOGEN 0.2 11/03/2014 1100   NITRITE NEGATIVE 10/28/2015 2023   LEUKOCYTESUR TRACE (A) 10/28/2015 2023   Sepsis Labs: @LABRCNTIP (procalcitonin:4,lacticidven:4) ) Recent Results (from the past 240 hour(s))  MRSA PCR Screening     Status: Abnormal   Collection Time: 05/31/17  9:06 PM  Result Value Ref Range Status   MRSA by PCR POSITIVE (A) NEGATIVE Final    Comment: RESULT CALLED TO, READ BACK BY AND VERIFIED WITH: LIGHTNER,N @ 2336 ON 05/31/17 BY JUW        The GeneXpert MRSA Assay (FDA approved for NASAL specimens only), is one component of a comprehensive MRSA colonization surveillance program. It is not intended to diagnose MRSA infection nor to guide or monitor treatment for MRSA infections.      Scheduled Meds: . aspirin  81 mg Oral Daily  . atorvastatin  40 mg Oral q1800  . Chlorhexidine Gluconate Cloth  6 each Topical Q0600  . cycloSPORINE  1 drop Both Eyes BID  . folic acid  1 mg Oral q morning - 10a  . hydroxychloroquine  100 mg Oral Daily  . iron polysaccharides  150 mg Oral Daily  . latanoprost  1 drop Both Eyes QHS  . midodrine  20 mg Oral Q breakfast   And  . midodrine  10 mg Oral Q supper  . mupirocin ointment  1 application Nasal BID  . pantoprazole  40 mg Oral Daily  . polyethylene glycol  17 g Oral Daily  . senna-docusate  1 tablet Oral BID  . sodium bicarbonate  650 mg Oral TID   Continuous Infusions: . sodium chloride 125 mL/hr at 06/02/17 1528    Procedures/Studies: Ct Abdomen Pelvis Wo Contrast  Result Date: 05/30/2017 CLINICAL DATA:  Chest pain pain to the right rib and abdominal area EXAM:  CT CHEST, ABDOMEN AND PELVIS WITHOUT CONTRAST TECHNIQUE: Multidetector CT imaging of the chest, abdomen and pelvis was performed following the standard protocol without IV contrast. COMPARISON:  CT abdomen pelvis 10/29/2015, 09/08/2015, CT chest 09/06/2011; chest radiograph 05/30/2017 FINDINGS: CT CHEST FINDINGS Cardiovascular: Limited evaluation without intravenous contrast. Nonaneurysmal aorta. Moderate aortic atherosclerotic calcifications. Coronary artery calcification. Normal heart size. No pericardial effusion Mediastinum/Nodes: Midline trachea. No thyroid mass. 12 mm pretracheal lymph node, similar compared to 2013 CT chest. Esophagus shows a small distal hiatal hernia Lungs/Pleura: Bronchiectasis in the left upper lobe with parenchymal scarring. Scattered scarring at the right apex. Mild bronchiectasis in the right middle lobe and bilateral lower lobes with mild mucous plugging in the left lower lobe dilated bronchi scattered ground-glass densities in the left lower lobe and right middle lobe. Negative for pneumothorax or pleural effusion Musculoskeletal: Degenerative changes of the spine. No acute or suspicious bone lesion CT ABDOMEN PELVIS FINDINGS Hepatobiliary: Interim development of multiple hypodense liver masses. Surgical clips at the gallbladder fossa. No biliary dilatation Pancreas: Atrophic.  No inflammation Spleen: Normal in size without focal abnormality. Adrenals/Urinary Tract: Adrenal glands are within normal limits. Multiple stones are present within both kidneys, the largest on the right is seen in the upper pole and measures 7 mm, the largest on the left is seen in the lower pole and measures 6 mm. Cyst in the upper pole of the left kidney. Scarring in the bilateral kidneys. No hydronephrosis. Lobulated contour of the mid right kidney. Bladder unremarkable Stomach/Bowel: The stomach is nonenlarged. No dilated small bowel. No colon wall thickening. Vascular/Lymphatic: Extensive aortic  atherosclerosis. No aneurysmal dilatation. Lymph nodes adjacent to the GE junction unchanged. Multiple upper abdominal and periaortic lymph nodes, similar compared to the prior CT. 2.1 by 0.9 cm left periaortic lymph node, series 8, image number 24 is unchanged. Reproductive: Uterus is unremarkable.  No adnexal masses Other: Negative for free air or ascites. Ill-defined masslike process within the left upper quadrant of the abdomen between the spleen and upper pole of the left kidney. This is not seen on the prior study. Musculoskeletal: Degenerative changes of the spine. No suspicious bone lesions. 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 IMPRESSION: 1. Negative for pneumothorax or pleural effusion. No definite acute displaced rib fracture is seen. 2. Interim development of multiple ill-defined hypodense liver masses, concerning for metastatic disease. 3. Ill-defined masslike process in the left upper quadrant, between the upper pole of left kidney and spleen, uncertain if this is related to the tail of the pancreas or intraperitoneal inflammatory process. Metastatic disease is also considered given the new findings of multiple liver masses. Contrast-enhanced examination  would be helpful to further evaluate. 4. Similar appearance of mediastinal, upper abdominal and retroperitoneal mild adenopathy. 5. Multifocal areas of bronchiectasis and parenchymal scarring in the lungs. Mild mucous plugging in the left lower lobe with mild ground-glass density in the right middle and left lower lobes, findings could be secondary to chronic infection such as MAI. 6. Multiple stones within the bilateral kidneys.  No hydronephrosis. Electronically Signed   By: Donavan Foil M.D.   On: 05/30/2017 18:38   Dg Chest 2 View  Result Date: 05/30/2017 CLINICAL DATA:  Bilateral lower rib pain. EXAM: CHEST  2 VIEW COMPARISON:  11/22/2015 FINDINGS: Cardiomediastinal silhouette is normal. Mediastinal contours appear intact. Calcific  atherosclerotic disease of the aorta. There is no evidence of focal airspace consolidation, pleural effusion or pneumothorax. Chronic hyperinflation of the lungs with upper lobe predominant emphysematous changes and mild chronic interstitial lung changes. Bilateral apical pleural/subpleural linear and nodular thickening. Osseous structures are without acute abnormality. No displaced rib fractures are seen. Soft tissues are grossly normal. IMPRESSION: No displaced rib fractures are seen. Chronic changes of COPD. Calcific atherosclerotic disease of the aorta. Electronically Signed   By: Fidela Salisbury M.D.   On: 05/30/2017 15:33   Ct Chest Wo Contrast  Result Date: 05/30/2017 CLINICAL DATA:  Chest pain pain to the right rib and abdominal area EXAM: CT CHEST, ABDOMEN AND PELVIS WITHOUT CONTRAST TECHNIQUE: Multidetector CT imaging of the chest, abdomen and pelvis was performed following the standard protocol without IV contrast. COMPARISON:  CT abdomen pelvis 10/29/2015, 09/08/2015, CT chest 09/06/2011; chest radiograph 05/30/2017 FINDINGS: CT CHEST FINDINGS Cardiovascular: Limited evaluation without intravenous contrast. Nonaneurysmal aorta. Moderate aortic atherosclerotic calcifications. Coronary artery calcification. Normal heart size. No pericardial effusion Mediastinum/Nodes: Midline trachea. No thyroid mass. 12 mm pretracheal lymph node, similar compared to 2013 CT chest. Esophagus shows a small distal hiatal hernia Lungs/Pleura: Bronchiectasis in the left upper lobe with parenchymal scarring. Scattered scarring at the right apex. Mild bronchiectasis in the right middle lobe and bilateral lower lobes with mild mucous plugging in the left lower lobe dilated bronchi scattered ground-glass densities in the left lower lobe and right middle lobe. Negative for pneumothorax or pleural effusion Musculoskeletal: Degenerative changes of the spine. No acute or suspicious bone lesion CT ABDOMEN PELVIS FINDINGS  Hepatobiliary: Interim development of multiple hypodense liver masses. Surgical clips at the gallbladder fossa. No biliary dilatation Pancreas: Atrophic.  No inflammation Spleen: Normal in size without focal abnormality. Adrenals/Urinary Tract: Adrenal glands are within normal limits. Multiple stones are present within both kidneys, the largest on the right is seen in the upper pole and measures 7 mm, the largest on the left is seen in the lower pole and measures 6 mm. Cyst in the upper pole of the left kidney. Scarring in the bilateral kidneys. No hydronephrosis. Lobulated contour of the mid right kidney. Bladder unremarkable Stomach/Bowel: The stomach is nonenlarged. No dilated small bowel. No colon wall thickening. Vascular/Lymphatic: Extensive aortic atherosclerosis. No aneurysmal dilatation. Lymph nodes adjacent to the GE junction unchanged. Multiple upper abdominal and periaortic lymph nodes, similar compared to the prior CT. 2.1 by 0.9 cm left periaortic lymph node, series 8, image number 24 is unchanged. Reproductive: Uterus is unremarkable.  No adnexal masses Other: Negative for free air or ascites. Ill-defined masslike process within the left upper quadrant of the abdomen between the spleen and upper pole of the left kidney. This is not seen on the prior study. Musculoskeletal: Degenerative changes of the spine. No suspicious bone  lesions. 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 IMPRESSION: 1. Negative for pneumothorax or pleural effusion. No definite acute displaced rib fracture is seen. 2. Interim development of multiple ill-defined hypodense liver masses, concerning for metastatic disease. 3. Ill-defined masslike process in the left upper quadrant, between the upper pole of left kidney and spleen, uncertain if this is related to the tail of the pancreas or intraperitoneal inflammatory process. Metastatic disease is also considered given the new findings of multiple liver masses. Contrast-enhanced examination  would be helpful to further evaluate. 4. Similar appearance of mediastinal, upper abdominal and retroperitoneal mild adenopathy. 5. Multifocal areas of bronchiectasis and parenchymal scarring in the lungs. Mild mucous plugging in the left lower lobe with mild ground-glass density in the right middle and left lower lobes, findings could be secondary to chronic infection such as MAI. 6. Multiple stones within the bilateral kidneys.  No hydronephrosis. Electronically Signed   By: Donavan Foil M.D.   On: 05/30/2017 18:38   Mr Abdomen Wo Contrast  Result Date: 06/02/2017 CLINICAL DATA:  Multiple new liver masses on recent unenhanced CT abdomen/pelvis. EXAM: MRI ABDOMEN WITHOUT CONTRAST TECHNIQUE: Multiplanar multisequence MR imaging was performed without the administration of intravenous contrast. COMPARISON:  05/30/2017 unenhanced CT abdomen/pelvis. FINDINGS: Lower chest: Small dependent bilateral pleural effusions. There is a 2.6 x 2.5 cm nodular opacity in the medial right middle lobe (series 4/image 7). Patchy left lower lobe opacity is not convincingly changed from recent chest CT. Hepatobiliary: No hepatic steatosis. There are numerous (greater than 10) similar-appearing masses of various sizes scattered throughout the liver, each demonstrating mild T2 hyperintensity and T1 hypointensity with mildly restricted diffusion. Representative 2.9 x 2.0 cm peripheral right liver lobe mass (series 4/image 16) and 2.7 x 2.5 cm anterior left liver lobe mass (series 4/image 23). These masses all appear new since 10/29/2015 CT. Cholecystectomy. Bile ducts are within normal post cholecystectomy limits. Common bile duct diameter 5 mm. No evidence of choledocholithiasis. Pancreas: There is a poorly marginated 4.6 x 4.3 cm upper left retroperitoneal soft tissue mass centered in the pancreatic tail (series 14/image 17), new since 10/29/2015 CT, which demonstrates restricted diffusion and mild T2 hyperintensity and T1  hypointensity compared to the spleen, which may invade upper pole of the left kidney and splenic hilum. No pancreatic duct dilation. No additional pancreatic mass. No evidence of pancreas divisum. Spleen: Normal size. Adrenals/Urinary Tract: Normal right adrenal. The left retroperitoneal mass appears to involve the left adrenal gland. No hydronephrosis. Simple appearing 1.6 cm upper right renal cyst. Stomach/Bowel: Small hiatal hernia. Otherwise normal nondistended stomach. Visualized small and large bowel is normal caliber, with no bowel wall thickening. Vascular/Lymphatic: Normal caliber abdominal aorta. Mild left para-aortic adenopathy measuring up to 1.0 cm (series 7/image 54), not definitely changed since 10/29/2015 CT. Other: No abdominal ascites or focal fluid collection. Musculoskeletal: No aggressive appearing focal osseous lesions. IMPRESSION: 1. Poorly marginated 4.6 x 4.3 cm upper left retroperitoneal soft tissue mass centered in the pancreatic tail with involvement/invasion of the splenic hilum, upper pole left kidney and left adrenal gland, new since 10/29/2015 CT. 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 2. Numerous (greater than 10) mildly T2 hyperintense liver masses scattered throughout the liver, suspect metastases. 3. Nonspecific mild left para-aortic adenopathy. 4. Medial right middle lobe nodular pulmonary opacity, indeterminate for neoplastic versus inflammatory etiology. Patchy left lower lobe opacity, unchanged, suspect inflammatory etiology. 5. Small dependent bilateral pleural effusions. 6. Small hiatal hernia. Electronically Signed  By: Ilona Sorrel M.D.   On: 06/02/2017 09:01   US Renal  Result Date: 06/02/2017 CLINICAL DATA:  Acute on chronic renal failure.  Nephrolithiasis. EXAM: RENAL / URINARY TRACT ULTRASOUND COMPLETE COMPARISON:  Abdominal MRI dated 06/02/2017 and CT scan of the abdomen and pelvis dated  05/30/2016 FINDINGS: Right Kidney: Length: 7.4 cm. Echogenicity within normal limits. Several small echogenic areas in the right kidney consistent with stones. No solid mass or hydronephrosis visualized. 1.4 cm cyst on the upper pole. Left Kidney: Length: 9.0 cm. Echogenicity within normal limits. No mass or hydronephrosis visualized. The ill-defined soft tissue density adjacent to the upper pole of the left kidney visible on CT scan of 05/30/2017 is not apparent on this exam. Bladder: Appears normal for degree of bladder distention. Multiple hypoechoic lesions in the liver consistent with metastatic disease are noted. IMPRESSION: 1. Multiple right renal stones. Small cyst in the upper pole the right kidney. 2. Normal appearing left kidney. The small stones visible on CT scan are not apparent on the ultrasound exam. 3. The ill-defined soft tissue mass adjacent to the upper pole of the left kidney on CT scan is not appreciable on this ultrasound. 4. Multiple masses in the liver consistent with metastatic disease. Electronically Signed   By: Lorriane Shire M.D.   On: 06/02/2017 08:45    Orson Eva, DO  [facility] Pager [phone]  If 7PM-7AM, please contact night-coverage [email or web address] Password [email or web address] 06/02/2017, 5:20 PM   LOS: 0 days

## 2017-06-02 NOTE — Discharge Summary (Signed)
Physician Discharge Summary  Tiffany [name] BZJ:[id number] DOB: [birth year] DOA: 05/30/2017  PCP: Eustaquio Maize, MD  Admit date: 05/30/2017 Discharge date: 06/04/2017  Admitted From: Home Disposition:  Home   Recommendations for Outpatient Follow-up:  1. Follow up with Dr. Assunta Found 06/04/17 at 2PM 2. Please schedule outpatient liver biopsy with interventional radiology   Home Health: YES Equipment/Devices: HHPT  Discharge Condition: Stable CODE STATUS:FULL Diet recommendation: Heart Healthy   Brief/Interim Summary: 77 year old female with a history of coronary artery disease, cardia myopathy, CKD stage III, COPD, tobacco abuse, orthostatic hypotension, SLE/Sjogren's, and remote history of lymphoma presenting with epigastric pain and rib pain. The patient had a choking episode resulting in a Heimlich maneuver performed on her on 05/26/2017. Since that period of time, the patient has been complaining of "rib pain", epigastric pain, and right upper quadrant pain without exacerbating or alleviating factors. She denies any fevers, chills, nausea, vomiting, diarrhea, dysuria, hematuria. CT of the chest, abdomen, and pelvis revealed multiple abnormalities. It showed LULbronchiectasis. There is also bronchiectasis of the bilateral lower lobes with mucous plugging. There are scattered groundglass densities in the LLL and RML.There was also a new hypodense liver masses and aLUQmasslike process between the spleen and left upper kidney.  The MRI of the abdomen and pelvis suggested numerous liver masses with a left retroperitoneal mass at the pancreatic tail.  IR was consulted for liver biopsy.  This was performed 06/03/2017.  On 06/03/2017, IR did not feel comfortable performing liver biospy due to patient's drop in Hgb.  I spoke with IR, Dr. Barbie Banner, who felt the patient has a component of active blood loss and cancelled liver biopsy.  Patient's family demanded PRBC transfusion despite my  explanation that her Hgb was 7.6 and represented a dilutional drop.  The patient remained hemodynamically stable without tachycardia throughout the hospitalization.  Nevertheless, 2 units PRBC were ordered and transfused  After discussion of the risks, benefits, and alternatives with the patient which she understood and accepted.  I discussed the case with the patient's primary care practice, Dr. Claretta Fraise.  I requested that they set up an outpatient liver biopsy.  Patient has appointment with her PCP, Dr. Assunta Found on 06/04/17 at [facility]      Discharge Diagnoses:  Atypical chest pain -Likely related to recent Heimlich maneuver -Troponins unremarkable  -Finish cycling troponins--flat at 0.03 -Echocardiogram--EF 55%, grade 2 DD, no WMA, mild MR -Personally reviewed EKG--sinus rhythm, no ST-T wave changes -Personally reviewed chest x-ray--no infiltrates or edema -continue ASA  Liver masses/left upper quadrant mass -Request IR for biopsy of liver masses--tentatively 06/03/17 -Check CEA and AFP--both unremarkable -8/18/2017colonoscopy normal -06/02/2017 MR abdomen--left retroperitoneal soft tissue mass at the pancreatic tail; numerous T2 hyperintense liver masses, likely metastasis  Acute Blood Loss Anemia -No obvious evidence of active blood loss during the hospitalization -drop in Hgb dilutional due to aggressive fluid resuscitation -several FOBTs in past were neg -FOBT was ordered this admission, but pt had not had BM -Patient has remained hemodynamically stable without tachycardia and asymptomatic throughout the entire hospitalization -Patient's family demanded PRBC transfusion despite my explanation that her Hgb was 7.6 and represented a dilutional drop. -Nevertheless, 2 units PRBC were ordered and transfused  After discussion of the risks, benefits, and alternatives with the patient which she understood and accepted.   Hypercalcemia--nonparathyroid mediated -Corrected calcium  11.9 at time of admission -likely related to yet undiagnosed malignancy -Continue IV fluids-increased rate to 125cc/hr -Intact PTH--11 -Check 25 vitamin D--35 -TSH--7.106;  Free T4 0.88 -check PTH-rp--pending at time of d/c -check 1,25-vitamin D--70.3 (WNL) -d/c vitamin D supplement  Acute on chronic renal failure--CKD stage4 -Baseline creatinine 1.6-1.7 -Secondary to volume depletion -Continue bicarbonate -no improvement in last 24 hours -continue IVF--increase rate to 125cc/hr -renal us--no hydronephrosis  RUQ Abdominal pain -likely related to liver masses -increase percocet to 2 tabs q 4 hours prn pain  COPD/tobacco abuse -Approximately 15-pack-year history -Continues to smoke 3 cigarettes/day  Bronchiectasis without exacerbation -Mucinex -Stable on room air -Start bronchodilators  Coronary artery disease -status post DES to the mid RCA in 2006. -Cardiac catheterization from May 2017 showed widely patent stent site and otherwise minimal disease. -Continue aspirin and statin. No active angina symptoms  Orthostatic Hypotension -currently stable on Proamatine. No syncope recently  nonischemic cardiomyopathy -May 2017 Echowith LVEF 30-35%  Lupus/Sjogren's syndrome -Continue Plaquenil     Discharge Instructions   Allergies as of 06/03/2017      Reactions   Florinef [fludrocortisone Acetate]    Malaise: Discomfort   Iohexol Other (See Comments)   Pt. States that Dr. Florene Glen her kidney Dr. Does not want her to receive iv dye due to increased risk of kidney failure.    Sulfonamide Derivatives Hives   Tape Rash   ADHESIVE      Medication List    STOP taking these medications   HYDROcodone-acetaminophen 5-325 MG tablet Commonly known as:  NORCO/VICODIN   Vitamin D 400 units capsule     TAKE these medications   albuterol (2.5 MG/3ML) 0.083% nebulizer solution Commonly known as:  PROVENTIL Take 3 mLs (2.5 mg total) by nebulization every 6  (six) hours as needed for wheezing or shortness of breath.   aspirin 81 MG tablet Take 81 mg by mouth daily.   atorvastatin 40 MG tablet Commonly known as:  LIPITOR Take 1 tablet (40 mg total) by mouth daily.   clobetasol cream 0.05 % Commonly known as:  TEMOVATE Apply 1 application topically 2 (two) times daily.   cycloSPORINE 0.05 % ophthalmic emulsion Commonly known as:  RESTASIS Place 1 drop into both eyes 2 (two) times daily.   diazepam 2 MG tablet Commonly known as:  VALIUM One tab in the morning, 1-2 tabs at night   FERREX 150 150 MG capsule Generic drug:  iron polysaccharides TAKE 1 CAPSULE (150 MG TOTAL) BY MOUTH DAILY.   folic acid 1 MG tablet Commonly known as:  FOLVITE TAKE 1 TABLET (1 MG TOTAL) BY MOUTH EVERY MORNING.   hydroxychloroquine 200 MG tablet Commonly known as:  PLAQUENIL TAKE 2 TABLET WITH FOOD OR MILK ONCE A DAY ORALLY 30 DAYS   ketoconazole 2 % cream Commonly known as:  NIZORAL Apply 1 application topically daily. Use on corner of mouth   midodrine 10 MG tablet Commonly known as:  PROAMATINE TAKE 2 TABLETS EVERY MORNING AND TAKE 1 TABLET EVERY EVENING   mirtazapine 15 MG tablet Commonly known as:  REMERON Take 1 tablet (15 mg total) by mouth at bedtime.   nitroGLYCERIN 0.4 MG SL tablet Commonly known as:  NITROSTAT 1 TABLET UNDER TONGUE AT ONSET OF CHEST PAIN YOU MAY REPEAT EVERY 5 MINUTES FOR UP TO 3 DOSES.   omeprazole 20 MG capsule Commonly known as:  PRILOSEC TAKE 1 CAPSULE BY MOUTH TWICE A DAY   oxyCODONE-acetaminophen 5-325 MG tablet Commonly known as:  PERCOCET/ROXICET Take 2 tablets by mouth every 4 (four) hours as needed for moderate pain or severe pain.   polyethylene glycol packet Commonly  known as:  MIRALAX / GLYCOLAX Take 17 g by mouth daily.   PRESERVISION/LUTEIN PO Take 1 capsule by mouth daily.   senna-docusate 8.6-50 MG tablet Commonly known as:  Senokot-S Take 1 tablet by mouth 2 (two) times daily. For  constipation.   sodium bicarbonate 325 MG tablet Take 2 tablets (650 mg total) by mouth 3 (three) times daily.   Travoprost (BAK Free) 0.004 % Soln ophthalmic solution Commonly known as:  TRAVATAN Place 1 drop into both eyes at bedtime.      Follow-up Information    Eustaquio Maize, MD Follow up on 06/04/2017.   Specialty:  Pediatrics Why:  2pm Contact information: [place] [id number] [phone]          Allergies  Allergen Reactions  . Florinef [Fludrocortisone Acetate]     Malaise: Discomfort   . Iohexol Other (See Comments)    Pt. States that Dr. Florene Glen her kidney Dr. Does not want her to receive iv dye due to increased risk of kidney failure.   . Sulfonamide Derivatives Hives  . Tape Rash    ADHESIVE    Consultations:  IR   Procedures/Studies: Ct Abdomen Pelvis Wo Contrast  Result Date: 05/30/2017 CLINICAL DATA:  Chest pain pain to the right rib and abdominal area EXAM: CT CHEST, ABDOMEN AND PELVIS WITHOUT CONTRAST TECHNIQUE: Multidetector CT imaging of the chest, abdomen and pelvis was performed following the standard protocol without IV contrast. COMPARISON:  CT abdomen pelvis 10/29/2015, 09/08/2015, CT chest 09/06/2011; chest radiograph 05/30/2017 FINDINGS: CT CHEST FINDINGS Cardiovascular: Limited evaluation without intravenous contrast. Nonaneurysmal aorta. Moderate aortic atherosclerotic calcifications. Coronary artery calcification. Normal heart size. No pericardial effusion Mediastinum/Nodes: Midline trachea. No thyroid mass. 12 mm pretracheal lymph node, similar compared to 2013 CT chest. Esophagus shows a small distal hiatal hernia Lungs/Pleura: Bronchiectasis in the left upper lobe with parenchymal scarring. Scattered scarring at the right apex. Mild bronchiectasis in the right middle lobe and bilateral lower lobes with mild mucous plugging in the left lower lobe dilated bronchi scattered ground-glass densities in the left lower lobe and  right middle lobe. Negative for pneumothorax or pleural effusion Musculoskeletal: Degenerative changes of the spine. No acute or suspicious bone lesion CT ABDOMEN PELVIS FINDINGS Hepatobiliary: Interim development of multiple hypodense liver masses. Surgical clips at the gallbladder fossa. No biliary dilatation Pancreas: Atrophic.  No inflammation Spleen: Normal in size without focal abnormality. Adrenals/Urinary Tract: Adrenal glands are within normal limits. Multiple stones are present within both kidneys, the largest on the right is seen in the upper pole and measures 7 mm, the largest on the left is seen in the lower pole and measures 6 mm. Cyst in the upper pole of the left kidney. Scarring in the bilateral kidneys. No hydronephrosis. Lobulated contour of the mid right kidney. Bladder unremarkable Stomach/Bowel: The stomach is nonenlarged. No dilated small bowel. No colon wall thickening. Vascular/Lymphatic: Extensive aortic atherosclerosis. No aneurysmal dilatation. Lymph nodes adjacent to the GE junction unchanged. Multiple upper abdominal and periaortic lymph nodes, similar compared to the prior CT. 2.1 by 0.9 cm left periaortic lymph node, series 8, image number 24 is unchanged. Reproductive: Uterus is unremarkable.  No adnexal masses Other: Negative for free air or ascites. Ill-defined masslike process within the left upper quadrant of the abdomen between the spleen and upper pole of the left kidney. This is not seen on the prior study. Musculoskeletal: Degenerative changes of the spine. No suspicious bone lesions. Probable AVN of the  femoral heads. IMPRESSION: 1. Negative for pneumothorax or pleural effusion. No definite acute displaced rib fracture is seen. 2. Interim development of multiple ill-defined hypodense liver masses, concerning for metastatic disease. 3. Ill-defined masslike process in the left upper quadrant, between the upper pole of left kidney and spleen, uncertain if this is related to the  tail of the pancreas or intraperitoneal inflammatory process. Metastatic disease is also considered given the new findings of multiple liver masses. Contrast-enhanced examination would be helpful to further evaluate. 4. Similar appearance of mediastinal, upper abdominal and retroperitoneal mild adenopathy. 5. Multifocal areas of bronchiectasis and parenchymal scarring in the lungs. Mild mucous plugging in the left lower lobe with mild ground-glass density in the right middle and left lower lobes, findings could be secondary to chronic infection such as MAI. 6. Multiple stones within the bilateral kidneys.  No hydronephrosis. Electronically Signed   By: Donavan Foil M.D.   On: 05/30/2017 18:38   Dg Chest 2 View  Result Date: 05/30/2017 CLINICAL DATA:  Bilateral lower rib pain. EXAM: CHEST  2 VIEW COMPARISON:  11/22/2015 FINDINGS: Cardiomediastinal silhouette is normal. Mediastinal contours appear intact. Calcific atherosclerotic disease of the aorta. There is no evidence of focal airspace consolidation, pleural effusion or pneumothorax. Chronic hyperinflation of the lungs with upper lobe predominant emphysematous changes and mild chronic interstitial lung changes. Bilateral apical pleural/subpleural linear and nodular thickening. Osseous structures are without acute abnormality. No displaced rib fractures are seen. Soft tissues are grossly normal. IMPRESSION: No displaced rib fractures are seen. Chronic changes of COPD. Calcific atherosclerotic disease of the aorta. Electronically Signed   By: Fidela Salisbury M.D.   On: 05/30/2017 15:33   Ct Chest Wo Contrast  Result Date: 05/30/2017 CLINICAL DATA:  Chest pain pain to the right rib and abdominal area EXAM: CT CHEST, ABDOMEN AND PELVIS WITHOUT CONTRAST TECHNIQUE: Multidetector CT imaging of the chest, abdomen and pelvis was performed following the standard protocol without IV contrast. COMPARISON:  CT abdomen pelvis 10/29/2015, 09/08/2015, CT chest  09/06/2011; chest radiograph 05/30/2017 FINDINGS: CT CHEST FINDINGS Cardiovascular: Limited evaluation without intravenous contrast. Nonaneurysmal aorta. Moderate aortic atherosclerotic calcifications. Coronary artery calcification. Normal heart size. No pericardial effusion Mediastinum/Nodes: Midline trachea. No thyroid mass. 12 mm pretracheal lymph node, similar compared to 2013 CT chest. Esophagus shows a small distal hiatal hernia Lungs/Pleura: Bronchiectasis in the left upper lobe with parenchymal scarring. Scattered scarring at the right apex. Mild bronchiectasis in the right middle lobe and bilateral lower lobes with mild mucous plugging in the left lower lobe dilated bronchi scattered ground-glass densities in the left lower lobe and right middle lobe. Negative for pneumothorax or pleural effusion Musculoskeletal: Degenerative changes of the spine. No acute or suspicious bone lesion CT ABDOMEN PELVIS FINDINGS Hepatobiliary: Interim development of multiple hypodense liver masses. Surgical clips at the gallbladder fossa. No biliary dilatation Pancreas: Atrophic.  No inflammation Spleen: Normal in size without focal abnormality. Adrenals/Urinary Tract: Adrenal glands are within normal limits. Multiple stones are present within both kidneys, the largest on the right is seen in the upper pole and measures 7 mm, the largest on the left is seen in the lower pole and measures 6 mm. Cyst in the upper pole of the left kidney. Scarring in the bilateral kidneys. No hydronephrosis. Lobulated contour of the mid right kidney. Bladder unremarkable Stomach/Bowel: The stomach is nonenlarged. No dilated small bowel. No colon wall thickening. Vascular/Lymphatic: Extensive aortic atherosclerosis. No aneurysmal dilatation. Lymph nodes adjacent to the GE junction unchanged. Multiple upper  abdominal and periaortic lymph nodes, similar compared to the prior CT. 2.1 by 0.9 cm left periaortic lymph node, series 8, image number 24 is  unchanged. Reproductive: Uterus is unremarkable.  No adnexal masses Other: Negative for free air or ascites. Ill-defined masslike process within the left upper quadrant of the abdomen between the spleen and upper pole of the left kidney. This is not seen on the prior study. Musculoskeletal: Degenerative changes of the spine. No suspicious bone lesions. 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 IMPRESSION: 1. Negative for pneumothorax or pleural effusion. No definite acute displaced rib fracture is seen. 2. Interim development of multiple ill-defined hypodense liver masses, concerning for metastatic disease. 3. Ill-defined masslike process in the left upper quadrant, between the upper pole of left kidney and spleen, uncertain if this is related to the tail of the pancreas or intraperitoneal inflammatory process. Metastatic disease is also considered given the new findings of multiple liver masses. Contrast-enhanced examination would be helpful to further evaluate. 4. Similar appearance of mediastinal, upper abdominal and retroperitoneal mild adenopathy. 5. Multifocal areas of bronchiectasis and parenchymal scarring in the lungs. Mild mucous plugging in the left lower lobe with mild ground-glass density in the right middle and left lower lobes, findings could be secondary to chronic infection such as MAI. 6. Multiple stones within the bilateral kidneys.  No hydronephrosis. Electronically Signed   By: Donavan Foil M.D.   On: 05/30/2017 18:38   Mr Abdomen Wo Contrast  Result Date: 06/02/2017 CLINICAL DATA:  Multiple new liver masses on recent unenhanced CT abdomen/pelvis. EXAM: MRI ABDOMEN WITHOUT CONTRAST TECHNIQUE: Multiplanar multisequence MR imaging was performed without the administration of intravenous contrast. COMPARISON:  05/30/2017 unenhanced CT abdomen/pelvis. FINDINGS: Lower chest: Small dependent bilateral pleural effusions. There is a 2.6 x 2.5 cm nodular opacity in the medial right middle lobe (series  4/image 7). Patchy left lower lobe opacity is not convincingly changed from recent chest CT. Hepatobiliary: No hepatic steatosis. There are numerous (greater than 10) similar-appearing masses of various sizes scattered throughout the liver, each demonstrating mild T2 hyperintensity and T1 hypointensity with mildly restricted diffusion. Representative 2.9 x 2.0 cm peripheral right liver lobe mass (series 4/image 16) and 2.7 x 2.5 cm anterior left liver lobe mass (series 4/image 23). These masses all appear new since 10/29/2015 CT. Cholecystectomy. Bile ducts are within normal post cholecystectomy limits. Common bile duct diameter 5 mm. No evidence of choledocholithiasis. Pancreas: There is a poorly marginated 4.6 x 4.3 cm upper left retroperitoneal soft tissue mass centered in the pancreatic tail (series 14/image 17), new since 10/29/2015 CT, which demonstrates restricted diffusion and mild T2 hyperintensity and T1 hypointensity compared to the spleen, which may invade upper pole of the left kidney and splenic hilum. No pancreatic duct dilation. No additional pancreatic mass. No evidence of pancreas divisum. Spleen: Normal size. Adrenals/Urinary Tract: Normal right adrenal. The left retroperitoneal mass appears to involve the left adrenal gland. No hydronephrosis. Simple appearing 1.6 cm upper right renal cyst. Stomach/Bowel: Small hiatal hernia. Otherwise normal nondistended stomach. Visualized small and large bowel is normal caliber, with no bowel wall thickening. Vascular/Lymphatic: Normal caliber abdominal aorta. Mild left para-aortic adenopathy measuring up to 1.0 cm (series 7/image 54), not definitely changed since 10/29/2015 CT. Other: No abdominal ascites or focal fluid collection. Musculoskeletal: No aggressive appearing focal osseous lesions. IMPRESSION: 1. Poorly marginated 4.6 x 4.3 cm upper left retroperitoneal soft tissue mass centered in the pancreatic tail with involvement/invasion of the splenic  hilum, upper pole  left kidney and left adrenal gland, new since 10/29/2015 CT. 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 2. Numerous (greater than 10) mildly T2 hyperintense liver masses scattered throughout the liver, suspect metastases. 3. Nonspecific mild left para-aortic adenopathy. 4. Medial right middle lobe nodular pulmonary opacity, indeterminate for neoplastic versus inflammatory etiology. Patchy left lower lobe opacity, unchanged, suspect inflammatory etiology. 5. Small dependent bilateral pleural effusions. 6. Small hiatal hernia. Electronically Signed   By: Ilona Sorrel M.D.   On: 06/02/2017 09:01   US Renal  Result Date: 06/02/2017 CLINICAL DATA:  Acute on chronic renal failure.  Nephrolithiasis. EXAM: RENAL / URINARY TRACT ULTRASOUND COMPLETE COMPARISON:  Abdominal MRI dated 06/02/2017 and CT scan of the abdomen and pelvis dated 05/30/2016 FINDINGS: Right Kidney: Length: 7.4 cm. Echogenicity within normal limits. Several small echogenic areas in the right kidney consistent with stones. No solid mass or hydronephrosis visualized. 1.4 cm cyst on the upper pole. Left Kidney: Length: 9.0 cm. Echogenicity within normal limits. No mass or hydronephrosis visualized. The ill-defined soft tissue density adjacent to the upper pole of the left kidney visible on CT scan of 05/30/2017 is not apparent on this exam. Bladder: Appears normal for degree of bladder distention. Multiple hypoechoic lesions in the liver consistent with metastatic disease are noted. IMPRESSION: 1. Multiple right renal stones. Small cyst in the upper pole the right kidney. 2. Normal appearing left kidney. The small stones visible on CT scan are not apparent on the ultrasound exam. 3. The ill-defined soft tissue mass adjacent to the upper pole of the left kidney on CT scan is not appreciable on this ultrasound. 4. Multiple masses in the liver consistent with metastatic  disease. Electronically Signed   By: Lorriane Shire M.D.   On: 06/02/2017 08:45        Discharge Exam: Vitals:   06/03/17 1052 06/03/17 1528  BP: 95/61 (!) 132/57  Pulse: 67 63  Resp:  17  Temp:  98.7 F (37.1 C)  SpO2: 92% 97%   Vitals:   06/02/17 2138 06/03/17 0641 06/03/17 1052 06/03/17 1528  BP: 123/71 (!) 114/54 95/61 (!) 132/57  Pulse: 67 80 67 63  Resp: 16 18  17   Temp: 98.3 F (36.8 C) 99.5 F (37.5 C)  98.7 F (37.1 C)  TempSrc: Oral Oral  Oral  SpO2: 96% 95% 92% 97%  Weight:      Height:        General: Pt is alert, awake, not in acute distress Cardiovascular: RRR, S1/S2 +, no rubs, no gallops Respiratory: CTA bilaterally, no wheezing, no rhonchi Abdominal: Soft, NT, ND, bowel sounds + Extremities: no edema, no cyanosis   The results of significant diagnostics from this hospitalization (including imaging, microbiology, ancillary and laboratory) are listed below for reference.    Significant Diagnostic Studies: Ct Abdomen Pelvis Wo Contrast  Result Date: 05/30/2017 CLINICAL DATA:  Chest pain pain to the right rib and abdominal area EXAM: CT CHEST, ABDOMEN AND PELVIS WITHOUT CONTRAST TECHNIQUE: Multidetector CT imaging of the chest, abdomen and pelvis was performed following the standard protocol without IV contrast. COMPARISON:  CT abdomen pelvis 10/29/2015, 09/08/2015, CT chest 09/06/2011; chest radiograph 05/30/2017 FINDINGS: CT CHEST FINDINGS Cardiovascular: Limited evaluation without intravenous contrast. Nonaneurysmal aorta. Moderate aortic atherosclerotic calcifications. Coronary artery calcification. Normal heart size. No pericardial effusion Mediastinum/Nodes: Midline trachea. No thyroid mass. 12 mm pretracheal lymph node, similar compared to 2013 CT chest. Esophagus shows a small distal hiatal hernia Lungs/Pleura:  Bronchiectasis in the left upper lobe with parenchymal scarring. Scattered scarring at the right apex. Mild bronchiectasis in the right middle  lobe and bilateral lower lobes with mild mucous plugging in the left lower lobe dilated bronchi scattered ground-glass densities in the left lower lobe and right middle lobe. Negative for pneumothorax or pleural effusion Musculoskeletal: Degenerative changes of the spine. No acute or suspicious bone lesion CT ABDOMEN PELVIS FINDINGS Hepatobiliary: Interim development of multiple hypodense liver masses. Surgical clips at the gallbladder fossa. No biliary dilatation Pancreas: Atrophic.  No inflammation Spleen: Normal in size without focal abnormality. Adrenals/Urinary Tract: Adrenal glands are within normal limits. Multiple stones are present within both kidneys, the largest on the right is seen in the upper pole and measures 7 mm, the largest on the left is seen in the lower pole and measures 6 mm. Cyst in the upper pole of the left kidney. Scarring in the bilateral kidneys. No hydronephrosis. Lobulated contour of the mid right kidney. Bladder unremarkable Stomach/Bowel: The stomach is nonenlarged. No dilated small bowel. No colon wall thickening. Vascular/Lymphatic: Extensive aortic atherosclerosis. No aneurysmal dilatation. Lymph nodes adjacent to the GE junction unchanged. Multiple upper abdominal and periaortic lymph nodes, similar compared to the prior CT. 2.1 by 0.9 cm left periaortic lymph node, series 8, image number 24 is unchanged. Reproductive: Uterus is unremarkable.  No adnexal masses Other: Negative for free air or ascites. Ill-defined masslike process within the left upper quadrant of the abdomen between the spleen and upper pole of the left kidney. This is not seen on the prior study. Musculoskeletal: Degenerative changes of the spine. No suspicious bone lesions. 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 IMPRESSION: 1. Negative for pneumothorax or pleural effusion. No definite acute displaced rib fracture is seen. 2. Interim development of multiple ill-defined hypodense liver masses, concerning for  metastatic disease. 3. Ill-defined masslike process in the left upper quadrant, between the upper pole of left kidney and spleen, uncertain if this is related to the tail of the pancreas or intraperitoneal inflammatory process. Metastatic disease is also considered given the new findings of multiple liver masses. Contrast-enhanced examination would be helpful to further evaluate. 4. Similar appearance of mediastinal, upper abdominal and retroperitoneal mild adenopathy. 5. Multifocal areas of bronchiectasis and parenchymal scarring in the lungs. Mild mucous plugging in the left lower lobe with mild ground-glass density in the right middle and left lower lobes, findings could be secondary to chronic infection such as MAI. 6. Multiple stones within the bilateral kidneys.  No hydronephrosis. Electronically Signed   By: Donavan Foil M.D.   On: 05/30/2017 18:38   Dg Chest 2 View  Result Date: 05/30/2017 CLINICAL DATA:  Bilateral lower rib pain. EXAM: CHEST  2 VIEW COMPARISON:  11/22/2015 FINDINGS: Cardiomediastinal silhouette is normal. Mediastinal contours appear intact. Calcific atherosclerotic disease of the aorta. There is no evidence of focal airspace consolidation, pleural effusion or pneumothorax. Chronic hyperinflation of the lungs with upper lobe predominant emphysematous changes and mild chronic interstitial lung changes. Bilateral apical pleural/subpleural linear and nodular thickening. Osseous structures are without acute abnormality. No displaced rib fractures are seen. Soft tissues are grossly normal. IMPRESSION: No displaced rib fractures are seen. Chronic changes of COPD. Calcific atherosclerotic disease of the aorta. Electronically Signed   By: Fidela Salisbury M.D.   On: 05/30/2017 15:33   Ct Chest Wo Contrast  Result Date: 05/30/2017 CLINICAL DATA:  Chest pain pain to the right rib and abdominal area EXAM: CT CHEST, ABDOMEN AND PELVIS  WITHOUT CONTRAST TECHNIQUE: Multidetector CT imaging of  the chest, abdomen and pelvis was performed following the standard protocol without IV contrast. COMPARISON:  CT abdomen pelvis 10/29/2015, 09/08/2015, CT chest 09/06/2011; chest radiograph 05/30/2017 FINDINGS: CT CHEST FINDINGS Cardiovascular: Limited evaluation without intravenous contrast. Nonaneurysmal aorta. Moderate aortic atherosclerotic calcifications. Coronary artery calcification. Normal heart size. No pericardial effusion Mediastinum/Nodes: Midline trachea. No thyroid mass. 12 mm pretracheal lymph node, similar compared to 2013 CT chest. Esophagus shows a small distal hiatal hernia Lungs/Pleura: Bronchiectasis in the left upper lobe with parenchymal scarring. Scattered scarring at the right apex. Mild bronchiectasis in the right middle lobe and bilateral lower lobes with mild mucous plugging in the left lower lobe dilated bronchi scattered ground-glass densities in the left lower lobe and right middle lobe. Negative for pneumothorax or pleural effusion Musculoskeletal: Degenerative changes of the spine. No acute or suspicious bone lesion CT ABDOMEN PELVIS FINDINGS Hepatobiliary: Interim development of multiple hypodense liver masses. Surgical clips at the gallbladder fossa. No biliary dilatation Pancreas: Atrophic.  No inflammation Spleen: Normal in size without focal abnormality. Adrenals/Urinary Tract: Adrenal glands are within normal limits. Multiple stones are present within both kidneys, the largest on the right is seen in the upper pole and measures 7 mm, the largest on the left is seen in the lower pole and measures 6 mm. Cyst in the upper pole of the left kidney. Scarring in the bilateral kidneys. No hydronephrosis. Lobulated contour of the mid right kidney. Bladder unremarkable Stomach/Bowel: The stomach is nonenlarged. No dilated small bowel. No colon wall thickening. Vascular/Lymphatic: Extensive aortic atherosclerosis. No aneurysmal dilatation. Lymph nodes adjacent to the GE junction  unchanged. Multiple upper abdominal and periaortic lymph nodes, similar compared to the prior CT. 2.1 by 0.9 cm left periaortic lymph node, series 8, image number 24 is unchanged. Reproductive: Uterus is unremarkable.  No adnexal masses Other: Negative for free air or ascites. Ill-defined masslike process within the left upper quadrant of the abdomen between the spleen and upper pole of the left kidney. This is not seen on the prior study. Musculoskeletal: Degenerative changes of the spine. No suspicious bone lesions. 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 IMPRESSION: 1. Negative for pneumothorax or pleural effusion. No definite acute displaced rib fracture is seen. 2. Interim development of multiple ill-defined hypodense liver masses, concerning for metastatic disease. 3. Ill-defined masslike process in the left upper quadrant, between the upper pole of left kidney and spleen, uncertain if this is related to the tail of the pancreas or intraperitoneal inflammatory process. Metastatic disease is also considered given the new findings of multiple liver masses. Contrast-enhanced examination would be helpful to further evaluate. 4. Similar appearance of mediastinal, upper abdominal and retroperitoneal mild adenopathy. 5. Multifocal areas of bronchiectasis and parenchymal scarring in the lungs. Mild mucous plugging in the left lower lobe with mild ground-glass density in the right middle and left lower lobes, findings could be secondary to chronic infection such as MAI. 6. Multiple stones within the bilateral kidneys.  No hydronephrosis. Electronically Signed   By: Donavan Foil M.D.   On: 05/30/2017 18:38   Mr Abdomen Wo Contrast  Result Date: 06/02/2017 CLINICAL DATA:  Multiple new liver masses on recent unenhanced CT abdomen/pelvis. EXAM: MRI ABDOMEN WITHOUT CONTRAST TECHNIQUE: Multiplanar multisequence MR imaging was performed without the administration of intravenous contrast. COMPARISON:  05/30/2017 unenhanced CT  abdomen/pelvis. FINDINGS: Lower chest: Small dependent bilateral pleural effusions. There is a 2.6 x 2.5 cm nodular opacity in the medial right middle  lobe (series 4/image 7). Patchy left lower lobe opacity is not convincingly changed from recent chest CT. Hepatobiliary: No hepatic steatosis. There are numerous (greater than 10) similar-appearing masses of various sizes scattered throughout the liver, each demonstrating mild T2 hyperintensity and T1 hypointensity with mildly restricted diffusion. Representative 2.9 x 2.0 cm peripheral right liver lobe mass (series 4/image 16) and 2.7 x 2.5 cm anterior left liver lobe mass (series 4/image 23). These masses all appear new since 10/29/2015 CT. Cholecystectomy. Bile ducts are within normal post cholecystectomy limits. Common bile duct diameter 5 mm. No evidence of choledocholithiasis. Pancreas: There is a poorly marginated 4.6 x 4.3 cm upper left retroperitoneal soft tissue mass centered in the pancreatic tail (series 14/image 17), new since 10/29/2015 CT, which demonstrates restricted diffusion and mild T2 hyperintensity and T1 hypointensity compared to the spleen, which may invade upper pole of the left kidney and splenic hilum. No pancreatic duct dilation. No additional pancreatic mass. No evidence of pancreas divisum. Spleen: Normal size. Adrenals/Urinary Tract: Normal right adrenal. The left retroperitoneal mass appears to involve the left adrenal gland. No hydronephrosis. Simple appearing 1.6 cm upper right renal cyst. Stomach/Bowel: Small hiatal hernia. Otherwise normal nondistended stomach. Visualized small and large bowel is normal caliber, with no bowel wall thickening. Vascular/Lymphatic: Normal caliber abdominal aorta. Mild left para-aortic adenopathy measuring up to 1.0 cm (series 7/image 54), not definitely changed since 10/29/2015 CT. Other: No abdominal ascites or focal fluid collection. Musculoskeletal: No aggressive appearing focal osseous lesions.  IMPRESSION: 1. Poorly marginated 4.6 x 4.3 cm upper left retroperitoneal soft tissue mass centered in the pancreatic tail with involvement/invasion of the splenic hilum, upper pole left kidney and left adrenal gland, new since 10/29/2015 CT. 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 2. Numerous (greater than 10) mildly T2 hyperintense liver masses scattered throughout the liver, suspect metastases. 3. Nonspecific mild left para-aortic adenopathy. 4. Medial right middle lobe nodular pulmonary opacity, indeterminate for neoplastic versus inflammatory etiology. Patchy left lower lobe opacity, unchanged, suspect inflammatory etiology. 5. Small dependent bilateral pleural effusions. 6. Small hiatal hernia. Electronically Signed   By: Ilona Sorrel M.D.   On: 06/02/2017 09:01   US Renal  Result Date: 06/02/2017 CLINICAL DATA:  Acute on chronic renal failure.  Nephrolithiasis. EXAM: RENAL / URINARY TRACT ULTRASOUND COMPLETE COMPARISON:  Abdominal MRI dated 06/02/2017 and CT scan of the abdomen and pelvis dated 05/30/2016 FINDINGS: Right Kidney: Length: 7.4 cm. Echogenicity within normal limits. Several small echogenic areas in the right kidney consistent with stones. No solid mass or hydronephrosis visualized. 1.4 cm cyst on the upper pole. Left Kidney: Length: 9.0 cm. Echogenicity within normal limits. No mass or hydronephrosis visualized. The ill-defined soft tissue density adjacent to the upper pole of the left kidney visible on CT scan of 05/30/2017 is not apparent on this exam. Bladder: Appears normal for degree of bladder distention. Multiple hypoechoic lesions in the liver consistent with metastatic disease are noted. IMPRESSION: 1. Multiple right renal stones. Small cyst in the upper pole the right kidney. 2. Normal appearing left kidney. The small stones visible on CT scan are not apparent on the ultrasound exam. 3. The ill-defined soft  tissue mass adjacent to the upper pole of the left kidney on CT scan is not appreciable on this ultrasound. 4. Multiple masses in the liver consistent with metastatic disease. Electronically Signed   By: Lorriane Shire M.D.   On: 06/02/2017 08:45     Microbiology:  Recent Results (from the past 240 hour(s))  MRSA PCR Screening     Status: Abnormal   Collection Time: 05/31/17  9:06 PM  Result Value Ref Range Status   MRSA by PCR POSITIVE (A) NEGATIVE Final    Comment: RESULT CALLED TO, READ BACK BY AND VERIFIED WITH: LIGHTNER,N @ 2336 ON 05/31/17 BY JUW        The GeneXpert MRSA Assay (FDA approved for NASAL specimens only), is one component of a comprehensive MRSA colonization surveillance program. It is not intended to diagnose MRSA infection nor to guide or monitor treatment for MRSA infections.   Surgical PCR screen     Status: Abnormal   Collection Time: 06/02/17 11:53 AM  Result Value Ref Range Status   MRSA, PCR POSITIVE (A) NEGATIVE Final    Comment: RESULT CALLED TO, READ BACK BY AND VERIFIED WITH: JACKSON,N AT 1820 ON 1.21.2019 BY ISLEY,B    Staphylococcus aureus POSITIVE (A) NEGATIVE Final    Comment: RESULT CALLED TO, READ BACK BY AND VERIFIED WITH: JACKSON,N AT 1820 ON 1.21.2019 BY ISLEY,B (NOTE) The Xpert SA Assay (FDA approved for NASAL specimens in patients 72 years of age and older), is one component of a comprehensive surveillance program. It is not intended to diagnose infection nor to guide or monitor treatment.      Labs: Basic Metabolic Panel: Recent Labs  Lab 05/30/17 1457 05/30/17 2117 05/31/17 0707 06/01/17 0610 06/02/17 0448 06/03/17 0537  NA 137  --  137 137 135 134*  K 4.1  --  3.9 4.6 5.1 4.5  CL 102  --  104 105 107 107  CO2 22  --  22 21* 20* 17*  GLUCOSE 101*  --  90 87 77 76  BUN 39*  --  37* 34* 30* 28*  CREATININE 2.61*  --  2.50* 2.50* 2.33* 2.31*  CALCIUM 10.9*  --  10.1 10.1 9.9 9.1  MG  --  2.4  --   --   --   --     Liver Function Tests: Recent Labs  Lab 05/31/17 0707 06/01/17 0610 06/02/17 0448 06/03/17 0537  AST 58* 62* 85* 61*  ALT 38 43 60* 52  ALKPHOS 152* 141* 144* 124  BILITOT 0.4 0.4 0.7 0.7  PROT 6.2* 6.3* 6.5 5.5*  ALBUMIN 2.7* 2.6* 2.7* 2.2*   No results for input(s): LIPASE, AMYLASE in the last 168 hours. No results for input(s): AMMONIA in the last 168 hours. CBC: Recent Labs  Lab 05/30/17 1457 05/31/17 0707 06/03/17 0537 06/03/17 1110  WBC 7.1 4.2 6.1 5.0  NEUTROABS  --  2.0  --   --   HGB 9.3* 8.2* 7.6* 7.6*  HCT 30.0* 26.5* 25.6* 24.3*  MCV 100.7* 100.8* 102.8* 100.8*  PLT 174 152 160 145*   Cardiac Enzymes: Recent Labs  Lab 05/30/17 1457 05/30/17 2117 05/31/17 0707  TROPONINI <0.03 0.03* 0.03*   BNP: Invalid input(s): POCBNP CBG: Recent Labs  Lab 06/01/17 0834  GLUCAP 93    Time coordinating discharge:  Greater than 30 minutes  Signed:  Orson Eva, DO [facility] Pager: [phone] 06/03/2017, 7:11 PM

## 2017-06-02 NOTE — Evaluation (Signed)
Physical Therapy Evaluation Patient Details Name: Tiffany [name] MRN: [id number] DOB: 1941/01/08 Today's Date: 06/02/2017   History of Present Illness  Tiffany [name] is a 77 y.o. female with medical history significant of anemia, CAD, cardiomyopathy, chronic lower back pain, stage III chronic kidney disease, COPD, depression, dysphagia, history of blood transfusion, hyperlipidemia, urolithiasis, lymphoma, orthostatic hypotension on midodrine, history of postmenopausal bleeding, history of pneumonia, rheumatoid arthritis, Sjogren's syndrome, SLE, history of syncope who is coming to the emergency department with complaints of chest pain that started around 1300 today associated with diaphoresis.  She denies dyspnea, dizziness, nausea, emesis, lower extremity edema, PND orthopnea.  She gets frequent palpitations, but denies any palpitations at the time of the chest pain.  She mentions that the pain also extends to her upper abdomen, and she believes that this is due to having the Heimlich maneuver performed on her on Monday.  She denies headache, fever, chills, sore throat, recent wheezing, hemoptysis, diarrhea, melena or hematochezia.  She gets frequent constipation.  She denies dysuria, frequency, hematuria or oliguria.  Denies polyuria, polydipsia, polyphagia or blurred vision.    Clinical Impression  Patient demonstrates slow labored movement for sit to stands and during gait training, mostly limited by fatigue and right lower abdominal discomfort.  Patient requested and instructed in HEP with written instructions provided and encouraged to do daily along with walking daily assisted by family members.  Patient will benefit from continued physical therapy in hospital and recommended venue below to increase strength, balance, endurance for safe ADLs and gait.     Follow Up Recommendations Home health PT;Supervision for mobility/OOB    Equipment Recommendations  None recommended by PT     Recommendations for Other Services       Precautions / Restrictions Precautions Precautions: Fall Restrictions Weight Bearing Restrictions: No      Mobility  Bed Mobility Overal bed mobility: Modified Independent                Transfers Overall transfer level: Needs assistance Equipment used: Rolling walker (2 wheeled) Transfers: Sit to/from [facility] Sit to Stand: Min guard Stand pivot transfers: Min guard          Ambulation/Gait Ambulation/Gait assistance: Min assist Ambulation Distance (Feet): 50 Feet Assistive device: Rolling walker (2 wheeled) Gait Pattern/deviations: Decreased step length - right;Decreased step length - left;Decreased stride length   Gait velocity interpretation: Below normal speed for age/gender General Gait Details: demonstrates slow labored cadence with 1 stumble to right, has to make wide turns when turning around, limited secondary to c/o fatigue  Stairs            Wheelchair Mobility    Modified Rankin (Stroke Patients Only)       Balance Overall balance assessment: Needs assistance Sitting-balance support: No upper extremity supported;Feet supported Sitting balance-Leahy Scale: Good     Standing balance support: Bilateral upper extremity supported;During functional activity Standing balance-Leahy Scale: Fair                               Pertinent Vitals/Pain Pain Assessment: Faces Faces Pain Scale: Hurts little more Pain Location: right side of lower abdomen Pain Descriptors / Indicators: Sharp;Constant Pain Intervention(s): Limited activity within patient's tolerance;Monitored during session    [place] expects to be discharged to:: Private residence Living Arrangements: Other relatives Available Help at Discharge: Family Type of Home: House Home Access: Stairs to enter Entrance  Stairs-Rails: None Entrance Stairs-Number of Steps: 2 on the back porch Home  Layout: One level Home Equipment: Walker - 2 wheels;Wheelchair - manual;Cane - single point;Hospital bed;Bedside commode;Shower seat      Prior Function Level of Independence: Needs assistance   Gait / Transfers Assistance Needed: Ambuates with RW household distances, w/c for community, assisted by family when going up/down steps  ADL's / Homemaking Assistance Needed: assisted by granddaughter        Hand Dominance        Extremity/Trunk Assessment   Upper Extremity Assessment Upper Extremity Assessment: Generalized weakness    Lower Extremity Assessment Lower Extremity Assessment: Generalized weakness    Cervical / Trunk Assessment Cervical / Trunk Assessment: Normal  Communication   Communication: No difficulties  Cognition Arousal/Alertness: Awake/alert Behavior During Therapy: WFL for tasks assessed/performed Overall Cognitive Status: Within Functional Limits for tasks assessed                                        General Comments      Exercises     Assessment/Plan    PT Assessment Patient needs continued PT services  PT Problem List Decreased strength;Decreased activity tolerance;Decreased balance;Decreased mobility       PT Treatment Interventions Gait training;Stair training;Functional mobility training;Therapeutic activities;Therapeutic exercise;Patient/family education    PT Goals (Current goals can be found in the Care Plan section)  Acute Rehab PT Goals Patient Stated Goal: return home  PT Goal Formulation: With patient/family Time For Goal Achievement: 06/05/17 Potential to Achieve Goals: Good    Frequency Min 3X/week   Barriers to discharge        Co-evaluation               AM-PAC PT "6 Clicks" Daily Activity  Outcome Measure Difficulty turning over in bed (including adjusting bedclothes, sheets and blankets)?: None Difficulty moving from lying on back to sitting on the side of the bed? : None Difficulty  sitting down on and standing up from a chair with arms (e.g., wheelchair, bedside commode, etc,.)?: A Little Help needed moving to and from a bed to chair (including a wheelchair)?: A Little Help needed walking in hospital room?: A Little Help needed climbing 3-5 steps with a railing? : A Lot 6 Click Score: 19    End of Session   Activity Tolerance: Patient tolerated treatment well;Patient limited by fatigue Patient left: in bed;with call bell/phone within reach;with family/visitor present Nurse Communication: Mobility status PT Visit Diagnosis: Unsteadiness on feet (R26.81);Other abnormalities of gait and mobility (R26.89);Muscle weakness (generalized) (M62.81)    Time: 3300-7622 PT Time Calculation (min) (ACUTE ONLY): 24 min   Charges:   PT Evaluation $PT Eval Moderate Complexity: 1 Mod PT Treatments $Therapeutic Activity: 23-37 mins   PT G Codes:        2:50 PM, 2017-06-23 Lonell Grandchild, MPT Physical Therapist with [facility] [phone] office [phone] mobile phone

## 2017-06-02 NOTE — Progress Notes (Addendum)
Per Dr. Annamaria Boots, patient is currently in MRI right now.  No plans for bx today and await liver MRI and reading.  Sometimes, no biopsy is needed pending MRI liver results.  However, if this is indeterminate, then we can likely plan for biopsy on 1/22.  ADDENUM: MRI completed and reviewed.  Will plan for liver lesion bx tomorrow, 1/22 at 11am.  She will need to be here by 10am.  I have written orders and spoken to the nurse who will arrange for [facility] pickup and drop off.  Patient is A&O and able to consent herself. 11:42 AM   Tiffany [name] .8:01 AM 06/02/2017

## 2017-06-02 NOTE — Plan of Care (Signed)
  Acute Rehab PT Goals(only PT should resolve) Patient Will Transfer Sit To/From Stand 06/02/2017 1450 - Progressing by Lonell Grandchild, PT Flowsheets Taken 06/02/2017 1450  Patient will transfer sit to/from stand with supervision Pt Will Transfer Bed To Chair/Chair To Bed 06/02/2017 1450 - Progressing by Lonell Grandchild, PT Flowsheets Taken 06/02/2017 1450  Pt will Transfer Bed to Chair/Chair to Bed with supervision Pt Will Ambulate 06/02/2017 1450 - Progressing by Lonell Grandchild, PT Flowsheets Taken 06/02/2017 1450  Pt will Ambulate with supervision;with rolling walker;75 feet  2:51 PM, 06/02/17 Lonell Grandchild, MPT Physical Therapist with [facility] [phone] office [phone] mobile phone

## 2017-06-02 NOTE — Care Management Note (Signed)
Case Management Note  Patient Details  Name: PAMLEA FINDER MRN: [id number] Date of Birth: 21-Nov-1940  Subjective/Objective:        Admitted with AKI and found to have liver masses. Pt to have MRI and possible biopsy. From home, lives with gradndaughters. One granddaughter is her aid. She needs assistance with bathing and dressing sometimes. She uses a RW with ambulation. She has PCP, transportation and insurance with drug coverage. She has no concerns about discharge. PT has recommended HH PT. Pt declines, requests printed instruction on exercises to do at home, her granddaughter will help.             Action/Plan: DC home with self care and previous arrangements.   Expected Discharge Date:  06/02/17               Expected Discharge Plan:  Home/Self Care  In-House Referral:  NA  Discharge planning Services  CM Consult  Post Acute Care Choice:  NA Choice offered to:  NA  Status of Service:  Completed, signed off  Sherald Barge, RN 06/02/2017, 11:56 AM

## 2017-06-03 ENCOUNTER — Encounter (HOSPITAL_COMMUNITY): Payer: Self-pay

## 2017-06-03 ENCOUNTER — Ambulatory Visit (HOSPITAL_COMMUNITY)
Admit: 2017-06-03 | Discharge: 2017-06-03 | Disposition: A | Discharge status: Home / Self Care | Payer: Medicare Other | Attending: Internal Medicine | Admitting: Internal Medicine

## 2017-06-03 DIAGNOSIS — J479 Bronchiectasis, uncomplicated: Secondary | ICD-10-CM | POA: Diagnosis not present

## 2017-06-03 DIAGNOSIS — R160 Hepatomegaly, not elsewhere classified: Secondary | ICD-10-CM | POA: Diagnosis not present

## 2017-06-03 DIAGNOSIS — N179 Acute kidney failure, unspecified: Secondary | ICD-10-CM | POA: Diagnosis not present

## 2017-06-03 DIAGNOSIS — R1010 Upper abdominal pain, unspecified: Secondary | ICD-10-CM | POA: Diagnosis not present

## 2017-06-03 DIAGNOSIS — I951 Orthostatic hypotension: Secondary | ICD-10-CM | POA: Diagnosis not present

## 2017-06-03 LAB — PREPARE RBC (CROSSMATCH)

## 2017-06-03 LAB — CBC
HCT: 24.3 % — ABNORMAL LOW (ref 36.0–46.0)
HEMATOCRIT: 25.6 % — AB (ref 36.0–46.0)
Hemoglobin: 7.6 g/dL — ABNORMAL LOW (ref 12.0–15.0)
Hemoglobin: 7.6 g/dL — ABNORMAL LOW (ref 12.0–15.0)
MCH: 30.5 pg (ref 26.0–34.0)
MCH: 31.5 pg (ref 26.0–34.0)
MCHC: 29.7 g/dL — AB (ref 30.0–36.0)
MCHC: 31.3 g/dL (ref 30.0–36.0)
MCV: 102.8 fL — ABNORMAL HIGH (ref 78.0–100.0)
MCV: 100.8 fL — AB (ref 78.0–100.0)
PLATELETS: 145 10*3/uL — AB (ref 150–400)
Platelets: 160 10*3/uL (ref 150–400)
RBC: 2.49 MIL/uL — ABNORMAL LOW (ref 3.87–5.11)
RBC: 2.41 MIL/uL — AB (ref 3.87–5.11)
RDW: 15.2 % (ref 11.5–15.5)
RDW: 15.3 % (ref 11.5–15.5)
WBC: 6.1 10*3/uL (ref 4.0–10.5)
WBC: 5 10*3/uL (ref 4.0–10.5)

## 2017-06-03 LAB — CALCITRIOL (1,25 DI-OH VIT D): VIT D 1 25 DIHYDROXY: 70.3 pg/mL (ref 19.9–79.3)

## 2017-06-03 LAB — COMPREHENSIVE METABOLIC PANEL
ALT: 52 U/L (ref 14–54)
AST: 61 U/L — ABNORMAL HIGH (ref 15–41)
Albumin: 2.2 g/dL — ABNORMAL LOW (ref 3.5–5.0)
Alkaline Phosphatase: 124 U/L (ref 38–126)
Anion gap: 10 (ref 5–15)
BUN: 28 mg/dL — ABNORMAL HIGH (ref 6–20)
CHLORIDE: 107 mmol/L (ref 101–111)
CO2: 17 mmol/L — ABNORMAL LOW (ref 22–32)
CREATININE: 2.31 mg/dL — AB (ref 0.44–1.00)
Calcium: 9.1 mg/dL (ref 8.9–10.3)
GFR, EST AFRICAN AMERICAN: 23 mL/min — AB (ref >60)
GFR, EST NON AFRICAN AMERICAN: 20 mL/min — AB (ref >60)
Glucose, Bld: 76 mg/dL (ref 65–99)
Potassium: 4.5 mmol/L (ref 3.5–5.1)
Sodium: 134 mmol/L — ABNORMAL LOW (ref 135–145)
Total Bilirubin: 0.7 mg/dL (ref 0.3–1.2)
Total Protein: 5.5 g/dL — ABNORMAL LOW (ref 6.5–8.1)

## 2017-06-03 MED ORDER — GELATIN ABSORBABLE 12-7 MM EX MISC
CUTANEOUS | Status: AC
  Filled 2017-06-03: qty 1

## 2017-06-03 MED ORDER — LIDOCAINE HCL (PF) 1 % IJ SOLN
INTRAMUSCULAR | Status: AC
  Filled 2017-06-03: qty 10

## 2017-06-03 MED ORDER — NALOXONE HCL 0.4 MG/ML IJ SOLN
INTRAMUSCULAR | Status: AC
  Filled 2017-06-03: qty 1

## 2017-06-03 MED ORDER — OXYCODONE-ACETAMINOPHEN 5-325 MG PO TABS
ORAL_TABLET | ORAL | Status: AC
  Filled 2017-06-03: qty 2

## 2017-06-03 MED ORDER — FLUMAZENIL 0.5 MG/5ML IV SOLN
INTRAVENOUS | Status: AC
  Filled 2017-06-03: qty 5

## 2017-06-03 MED ORDER — SODIUM CHLORIDE 0.9 % IV SOLN
Freq: Once | INTRAVENOUS | Status: AC
  Administered 2017-06-03: 17:00:00 via INTRAVENOUS

## 2017-06-03 MED ORDER — FENTANYL CITRATE (PF) 100 MCG/2ML IJ SOLN
INTRAMUSCULAR | Status: AC
  Filled 2017-06-03: qty 4

## 2017-06-03 MED ORDER — MIDAZOLAM HCL 2 MG/2ML IJ SOLN
INTRAMUSCULAR | Status: AC
  Filled 2017-06-03: qty 4

## 2017-06-03 MED ORDER — OXYCODONE-ACETAMINOPHEN 5-325 MG PO TABS: 2.0000 | ORAL_TABLET | ORAL | 0 refills | Status: DC | PRN

## 2017-06-03 MED ORDER — ALUM & MAG HYDROXIDE-SIMETH 200-200-20 MG/5ML PO SUSP
30.0000 mL | Freq: Four times a day (QID) | ORAL | Status: DC | PRN
  Administered 2017-06-03: 30 mL via ORAL
  Filled 2017-06-03: qty 30

## 2017-06-03 NOTE — Progress Notes (Signed)
PROGRESS NOTE  CHANNING SAVICH EXB:[id number] DOB: Dec 05, 1940 DOA: 05/30/2017 PCP: Eustaquio Maize, MD  Brief History:  77 year old female with a history of coronary artery disease, cardia myopathy, CKD stage III, COPD, tobacco abuse, orthostatic hypotension, SLE/Sjogren's, and remote history of lymphoma presenting with epigastric pain and rib pain. The patient had a choking episode resulting in a Heimlich maneuver performed on her on 05/26/2017. Since that period of time, the patient has been complaining of "rib pain", epigastric pain, and right upper quadrant pain without exacerbating or alleviating factors. She denies any fevers, chills, nausea, vomiting, diarrhea, dysuria, hematuria. CT of the chest, abdomen, and pelvis revealed multiple abnormalities. It showed LULbronchiectasis. There is also bronchiectasis of the bilateral lower lobes with mucous plugging. There are scattered groundglass densities in the LLL and RML.There was also a new hypodense liver masses and aLUQmasslike process between the spleen and left upper kidney.  The patient was started on intravenous fluids with improvement of her corrected calcium  On 06/03/2017, IR did not feel comfortable performing liver biospy due to patient's drop in Hgb.  I spoke with IR, Dr. Barbie Banner, who felt the patient has a component of active blood loss and cancelled liver biopsy.  Patient's family demanded PRBC transfusion despite my explanation that her Hgb was 7.6 and represented a dilutional drop.  The patient remained hemodynamically stable without tachycardia throughout the hospitalization.  Nevertheless, 2 units PRBC were ordered and transfused  After discussion of the risks, benefits, and alternatives with the patient which she understood and accepted.  I discussed the case with the patient's primary care practice, Dr. Claretta Fraise.  I requested that they set up an outpatient liver biopsy.  Patient has appointment with her PCP, Dr.  Assunta Found on 06/04/17 at 2PM.   Assessment/Plan: Atypical chest pain -Likely related to recent Heimlich maneuver -Troponins unremarkable so far -Finish cycling troponins--flat at 0.03 -Echocardiogram--EF 55%, grade 2 DD, no WMA, mild MR -Personally reviewed EKG--sinus rhythm, no ST-T wave changes -Personally reviewed chest x-ray--no infiltrates or edema -continue ASA  Acute Blood Loss Anemia -No obvious evidence of active blood loss during the hospitalization -drop in Hgb dilutional due to aggressive fluid resuscitation -several FOBTs in past were neg -FOBT was ordered this admission, but pt had not had BM -Patient has remained hemodynamically stable without tachycardia and asymptomatic throughout the entire hospitalization -Patient's family demanded PRBC transfusion despite my explanation that her Hgb was 7.6 and represented a dilutional drop. -Nevertheless, 2 units PRBC were ordered and transfused  After discussion of the risks, benefits, and alternatives with the patient which she understood and accepted.   Liver masses/left upper quadrant mass -Request IR for biopsy of liver masses--tentatively 06/03/17 -Check CEA and AFP--both unremarkable -8/18/2017colonoscopy normal -06/02/2017 MR abdomen--left retroperitoneal soft tissue mass at the pancreatic tail; numerous T2 hyperintense liver masses, likely metastasis -On 06/03/2017, IR did not feel comfortable performing liver biospy due to patient's drop in Hgb.  I spoke with IR, Dr. Barbie Banner, who felt the patient has a component of active blood loss and cancelled liver biopsy -liver biopsy will have to be rescheduled as outpatient -I spoke with Dr. Claretta Fraise at patient's PCP practice and informed him  Hypercalcemia--nonparathyroid mediated -Corrected calcium 11.9 at time of admission -Continue IV fluids-increased rate to 125cc/hr -Intact PTH--11 -Check 25 vitamin D--35 -TSH--7.106; Free T4 0.88 -check PTH-rp -check 1,25-vitamin  D -d/c vitamin D supplement  Acute on chronic renal failure--CKD stage4 -  Baseline creatinine 1.6-1.7 -Secondary to volume depletion -Continue bicarbonate -continue IVF--increase rate to 125cc/hr -renal us--no hydronephrosis -overall improved  RUQ Abdominal pain -likely related to liver masses -increase percocet to 2 tabs q 4 hours prn -start IV dilaudid for severe breakthrough pain if percocet does not help  COPD/tobacco abuse -Approximately 15-pack-year history -Continues to smoke 3 cigarettes/day  Bronchiectasis without exacerbation -Mucinex -Stable on room air -Start bronchodilators  Coronary artery disease -status post DES to the mid RCA in 2006. -Cardiac catheterization from May 2017 showed widely patent stent site and otherwise minimal disease. -Continue aspirin and statin. No active angina symptoms  Orthostatic Hypotension -currently stable on Proamatine. No syncope recently  nonischemic cardiomyopathy -May 2017 Echowith LVEF 30-35%  Lupus/Sjogren's syndrome -Continue Plaquenil      Disposition Plan:   Home 06/04/17 Family Communication:   Brother update at bedside; sister updated on phone-Total time spent 35 minutes.  Greater than 50% spent face to face counseling and coordinating care.   Consultants:IR    Code Status:  FULL   DVT Prophylaxis:   Brenda Lovenox   Procedures: As Listed in Progress Note Above  Antibiotics: None    Subjective: Patient denies fevers, chills, headache, chest pain, dyspnea, nausea, vomiting, diarrhea, abdominal pain, dysuria, hematuria, hematochezia, and melena. Patient complains of right upper quadrant pain which is improved.   Objective: Vitals:   06/02/17 2138 06/03/17 0641 06/03/17 1052 06/03/17 1528  BP: 123/71 (!) 114/54 95/61 (!) 132/57  Pulse: 67 80 67 63  Resp: 16 18  17   Temp: 98.3 F (36.8 C) 99.5 F (37.5 C)  98.7 F (37.1 C)  TempSrc: Oral Oral  Oral  SpO2: 96% 95% 92% 97%  Weight:       Height:        Intake/Output Summary (Last 24 hours) at 06/03/2017 1842 Last data filed at 06/03/2017 1743 Gross per 24 hour  Intake 4686.59 ml  Output 300 ml  Net 4386.59 ml   Weight change:  Exam:   General:  Pt is alert, follows commands appropriately, not in acute distress  HEENT: No icterus, No thrush, No neck mass, [place]/AT  Cardiovascular: RRR, S1/S2, no rubs, no gallops  Respiratory: CTA bilaterally, no wheezing, no crackles, no rhonchi  Abdomen: Soft/+BS, non tender, non distended, no guarding  Extremities: No edema, No lymphangitis, No petechiae, No rashes, no synovitis   Data Reviewed: I have personally reviewed following labs and imaging studies Basic Metabolic Panel: Recent Labs  Lab 05/30/17 1457 05/30/17 2117 05/31/17 0707 06/01/17 0610 06/02/17 0448 06/03/17 0537  NA 137  --  137 137 135 134*  K 4.1  --  3.9 4.6 5.1 4.5  CL 102  --  104 105 107 107  CO2 22  --  22 21* 20* 17*  GLUCOSE 101*  --  90 87 77 76  BUN 39*  --  37* 34* 30* 28*  CREATININE 2.61*  --  2.50* 2.50* 2.33* 2.31*  CALCIUM 10.9*  --  10.1 10.1 9.9 9.1  MG  --  2.4  --   --   --   --    Liver Function Tests: Recent Labs  Lab 05/31/17 0707 06/01/17 0610 06/02/17 0448 06/03/17 0537  AST 58* 62* 85* 61*  ALT 38 43 60* 52  ALKPHOS 152* 141* 144* 124  BILITOT 0.4 0.4 0.7 0.7  PROT 6.2* 6.3* 6.5 5.5*  ALBUMIN 2.7* 2.6* 2.7* 2.2*   No results for input(s): LIPASE, AMYLASE in the last  168 hours. No results for input(s): AMMONIA in the last 168 hours. Coagulation Profile: Recent Labs  Lab 06/02/17 1235  INR 1.11   CBC: Recent Labs  Lab 05/30/17 1457 05/31/17 0707 06/03/17 0537 06/03/17 1110  WBC 7.1 4.2 6.1 5.0  NEUTROABS  --  2.0  --   --   HGB 9.3* 8.2* 7.6* 7.6*  HCT 30.0* 26.5* 25.6* 24.3*  MCV 100.7* 100.8* 102.8* 100.8*  PLT 174 152 160 145*   Cardiac Enzymes: Recent Labs  Lab 05/30/17 1457 05/30/17 2117 05/31/17 0707  TROPONINI <0.03 0.03* 0.03*    BNP: Invalid input(s): POCBNP CBG: Recent Labs  Lab 06/01/17 0834  GLUCAP 93   HbA1C: No results for input(s): HGBA1C in the last 72 hours. Urine analysis:    Component Value Date/Time   COLORURINE YELLOW 10/28/2015 2023   APPEARANCEUR CLEAR 10/28/2015 2023   LABSPEC 1.020 10/28/2015 2023   PHURINE 6.0 10/28/2015 2023   GLUCOSEU NEGATIVE 10/28/2015 2023   HGBUR NEGATIVE 10/28/2015 2023   BILIRUBINUR NEGATIVE 10/28/2015 2023   KETONESUR NEGATIVE 10/28/2015 2023   PROTEINUR 30 (A) 10/28/2015 2023   UROBILINOGEN 0.2 11/03/2014 1100   NITRITE NEGATIVE 10/28/2015 2023   LEUKOCYTESUR TRACE (A) 10/28/2015 2023   Sepsis Labs: @LABRCNTIP (procalcitonin:4,lacticidven:4) ) Recent Results (from the past 240 hour(s))  MRSA PCR Screening     Status: Abnormal   Collection Time: 05/31/17  9:06 PM  Result Value Ref Range Status   MRSA by PCR POSITIVE (A) NEGATIVE Final    Comment: RESULT CALLED TO, READ BACK BY AND VERIFIED WITH: LIGHTNER,N @ 2336 ON 05/31/17 BY JUW        The GeneXpert MRSA Assay (FDA approved for NASAL specimens only), is one component of a comprehensive MRSA colonization surveillance program. It is not intended to diagnose MRSA infection nor to guide or monitor treatment for MRSA infections.   Surgical PCR screen     Status: Abnormal   Collection Time: 06/02/17 11:53 AM  Result Value Ref Range Status   MRSA, PCR POSITIVE (A) NEGATIVE Final    Comment: RESULT CALLED TO, READ BACK BY AND VERIFIED WITH: JACKSON,N AT 1820 ON 1.21.2019 BY ISLEY,B    Staphylococcus aureus POSITIVE (A) NEGATIVE Final    Comment: RESULT CALLED TO, READ BACK BY AND VERIFIED WITH: JACKSON,N AT 1820 ON 1.21.2019 BY ISLEY,B (NOTE) The Xpert SA Assay (FDA approved for NASAL specimens in patients 37 years of age and older), is one component of a comprehensive surveillance program. It is not intended to diagnose infection nor to guide or monitor treatment.      Scheduled  Meds: . aspirin  81 mg Oral Daily  . atorvastatin  40 mg Oral q1800  . Chlorhexidine Gluconate Cloth  6 each Topical Q0600  . cycloSPORINE  1 drop Both Eyes BID  . folic acid  1 mg Oral q morning - 10a  . hydroxychloroquine  100 mg Oral Daily  . iron polysaccharides  150 mg Oral Daily  . latanoprost  1 drop Both Eyes QHS  . midodrine  20 mg Oral Q breakfast   And  . midodrine  10 mg Oral Q supper  . mupirocin ointment  1 application Nasal BID  . pantoprazole  40 mg Oral Daily  . polyethylene glycol  17 g Oral Daily  . senna-docusate  1 tablet Oral BID  . sodium bicarbonate  650 mg Oral TID   Continuous Infusions: . sodium chloride 125 mL/hr at 06/03/17 1737  Procedures/Studies: Ct Abdomen Pelvis Wo Contrast  Result Date: 05/30/2017 CLINICAL DATA:  Chest pain pain to the right rib and abdominal area EXAM: CT CHEST, ABDOMEN AND PELVIS WITHOUT CONTRAST TECHNIQUE: Multidetector CT imaging of the chest, abdomen and pelvis was performed following the standard protocol without IV contrast. COMPARISON:  CT abdomen pelvis 10/29/2015, 09/08/2015, CT chest 09/06/2011; chest radiograph 05/30/2017 FINDINGS: CT CHEST FINDINGS Cardiovascular: Limited evaluation without intravenous contrast. Nonaneurysmal aorta. Moderate aortic atherosclerotic calcifications. Coronary artery calcification. Normal heart size. No pericardial effusion Mediastinum/Nodes: Midline trachea. No thyroid mass. 12 mm pretracheal lymph node, similar compared to 2013 CT chest. Esophagus shows a small distal hiatal hernia Lungs/Pleura: Bronchiectasis in the left upper lobe with parenchymal scarring. Scattered scarring at the right apex. Mild bronchiectasis in the right middle lobe and bilateral lower lobes with mild mucous plugging in the left lower lobe dilated bronchi scattered ground-glass densities in the left lower lobe and right middle lobe. Negative for pneumothorax or pleural effusion Musculoskeletal: Degenerative changes of  the spine. No acute or suspicious bone lesion CT ABDOMEN PELVIS FINDINGS Hepatobiliary: Interim development of multiple hypodense liver masses. Surgical clips at the gallbladder fossa. No biliary dilatation Pancreas: Atrophic.  No inflammation Spleen: Normal in size without focal abnormality. Adrenals/Urinary Tract: Adrenal glands are within normal limits. Multiple stones are present within both kidneys, the largest on the right is seen in the upper pole and measures 7 mm, the largest on the left is seen in the lower pole and measures 6 mm. Cyst in the upper pole of the left kidney. Scarring in the bilateral kidneys. No hydronephrosis. Lobulated contour of the mid right kidney. Bladder unremarkable Stomach/Bowel: The stomach is nonenlarged. No dilated small bowel. No colon wall thickening. Vascular/Lymphatic: Extensive aortic atherosclerosis. No aneurysmal dilatation. Lymph nodes adjacent to the GE junction unchanged. Multiple upper abdominal and periaortic lymph nodes, similar compared to the prior CT. 2.1 by 0.9 cm left periaortic lymph node, series 8, image number 24 is unchanged. Reproductive: Uterus is unremarkable.  No adnexal masses Other: Negative for free air or ascites. Ill-defined masslike process within the left upper quadrant of the abdomen between the spleen and upper pole of the left kidney. This is not seen on the prior study. Musculoskeletal: Degenerative changes of the spine. No suspicious bone lesions. 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 IMPRESSION: 1. Negative for pneumothorax or pleural effusion. No definite acute displaced rib fracture is seen. 2. Interim development of multiple ill-defined hypodense liver masses, concerning for metastatic disease. 3. Ill-defined masslike process in the left upper quadrant, between the upper pole of left kidney and spleen, uncertain if this is related to the tail of the pancreas or intraperitoneal inflammatory process. Metastatic disease is also considered given  the new findings of multiple liver masses. Contrast-enhanced examination would be helpful to further evaluate. 4. Similar appearance of mediastinal, upper abdominal and retroperitoneal mild adenopathy. 5. Multifocal areas of bronchiectasis and parenchymal scarring in the lungs. Mild mucous plugging in the left lower lobe with mild ground-glass density in the right middle and left lower lobes, findings could be secondary to chronic infection such as MAI. 6. Multiple stones within the bilateral kidneys.  No hydronephrosis. Electronically Signed   By: Donavan Foil M.D.   On: 05/30/2017 18:38   Dg Chest 2 View  Result Date: 05/30/2017 CLINICAL DATA:  Bilateral lower rib pain. EXAM: CHEST  2 VIEW COMPARISON:  11/22/2015 FINDINGS: Cardiomediastinal silhouette is normal. Mediastinal contours appear intact. Calcific atherosclerotic disease of the aorta.  There is no evidence of focal airspace consolidation, pleural effusion or pneumothorax. Chronic hyperinflation of the lungs with upper lobe predominant emphysematous changes and mild chronic interstitial lung changes. Bilateral apical pleural/subpleural linear and nodular thickening. Osseous structures are without acute abnormality. No displaced rib fractures are seen. Soft tissues are grossly normal. IMPRESSION: No displaced rib fractures are seen. Chronic changes of COPD. Calcific atherosclerotic disease of the aorta. Electronically Signed   By: Fidela Salisbury M.D.   On: 05/30/2017 15:33   Ct Chest Wo Contrast  Result Date: 05/30/2017 CLINICAL DATA:  Chest pain pain to the right rib and abdominal area EXAM: CT CHEST, ABDOMEN AND PELVIS WITHOUT CONTRAST TECHNIQUE: Multidetector CT imaging of the chest, abdomen and pelvis was performed following the standard protocol without IV contrast. COMPARISON:  CT abdomen pelvis 10/29/2015, 09/08/2015, CT chest 09/06/2011; chest radiograph 05/30/2017 FINDINGS: CT CHEST FINDINGS Cardiovascular: Limited evaluation without  intravenous contrast. Nonaneurysmal aorta. Moderate aortic atherosclerotic calcifications. Coronary artery calcification. Normal heart size. No pericardial effusion Mediastinum/Nodes: Midline trachea. No thyroid mass. 12 mm pretracheal lymph node, similar compared to 2013 CT chest. Esophagus shows a small distal hiatal hernia Lungs/Pleura: Bronchiectasis in the left upper lobe with parenchymal scarring. Scattered scarring at the right apex. Mild bronchiectasis in the right middle lobe and bilateral lower lobes with mild mucous plugging in the left lower lobe dilated bronchi scattered ground-glass densities in the left lower lobe and right middle lobe. Negative for pneumothorax or pleural effusion Musculoskeletal: Degenerative changes of the spine. No acute or suspicious bone lesion CT ABDOMEN PELVIS FINDINGS Hepatobiliary: Interim development of multiple hypodense liver masses. Surgical clips at the gallbladder fossa. No biliary dilatation Pancreas: Atrophic.  No inflammation Spleen: Normal in size without focal abnormality. Adrenals/Urinary Tract: Adrenal glands are within normal limits. Multiple stones are present within both kidneys, the largest on the right is seen in the upper pole and measures 7 mm, the largest on the left is seen in the lower pole and measures 6 mm. Cyst in the upper pole of the left kidney. Scarring in the bilateral kidneys. No hydronephrosis. Lobulated contour of the mid right kidney. Bladder unremarkable Stomach/Bowel: The stomach is nonenlarged. No dilated small bowel. No colon wall thickening. Vascular/Lymphatic: Extensive aortic atherosclerosis. No aneurysmal dilatation. Lymph nodes adjacent to the GE junction unchanged. Multiple upper abdominal and periaortic lymph nodes, similar compared to the prior CT. 2.1 by 0.9 cm left periaortic lymph node, series 8, image number 24 is unchanged. Reproductive: Uterus is unremarkable.  No adnexal masses Other: Negative for free air or ascites.  Ill-defined masslike process within the left upper quadrant of the abdomen between the spleen and upper pole of the left kidney. This is not seen on the prior study. Musculoskeletal: Degenerative changes of the spine. No suspicious bone lesions. 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 IMPRESSION: 1. Negative for pneumothorax or pleural effusion. No definite acute displaced rib fracture is seen. 2. Interim development of multiple ill-defined hypodense liver masses, concerning for metastatic disease. 3. Ill-defined masslike process in the left upper quadrant, between the upper pole of left kidney and spleen, uncertain if this is related to the tail of the pancreas or intraperitoneal inflammatory process. Metastatic disease is also considered given the new findings of multiple liver masses. Contrast-enhanced examination would be helpful to further evaluate. 4. Similar appearance of mediastinal, upper abdominal and retroperitoneal mild adenopathy. 5. Multifocal areas of bronchiectasis and parenchymal scarring in the lungs. Mild mucous plugging in the left lower lobe with mild  ground-glass density in the right middle and left lower lobes, findings could be secondary to chronic infection such as MAI. 6. Multiple stones within the bilateral kidneys.  No hydronephrosis. Electronically Signed   By: Donavan Foil M.D.   On: 05/30/2017 18:38   Mr Abdomen Wo Contrast  Result Date: 06/02/2017 CLINICAL DATA:  Multiple new liver masses on recent unenhanced CT abdomen/pelvis. EXAM: MRI ABDOMEN WITHOUT CONTRAST TECHNIQUE: Multiplanar multisequence MR imaging was performed without the administration of intravenous contrast. COMPARISON:  05/30/2017 unenhanced CT abdomen/pelvis. FINDINGS: Lower chest: Small dependent bilateral pleural effusions. There is a 2.6 x 2.5 cm nodular opacity in the medial right middle lobe (series 4/image 7). Patchy left lower lobe opacity is not convincingly changed from recent chest CT. Hepatobiliary: No  hepatic steatosis. There are numerous (greater than 10) similar-appearing masses of various sizes scattered throughout the liver, each demonstrating mild T2 hyperintensity and T1 hypointensity with mildly restricted diffusion. Representative 2.9 x 2.0 cm peripheral right liver lobe mass (series 4/image 16) and 2.7 x 2.5 cm anterior left liver lobe mass (series 4/image 23). These masses all appear new since 10/29/2015 CT. Cholecystectomy. Bile ducts are within normal post cholecystectomy limits. Common bile duct diameter 5 mm. No evidence of choledocholithiasis. Pancreas: There is a poorly marginated 4.6 x 4.3 cm upper left retroperitoneal soft tissue mass centered in the pancreatic tail (series 14/image 17), new since 10/29/2015 CT, which demonstrates restricted diffusion and mild T2 hyperintensity and T1 hypointensity compared to the spleen, which may invade upper pole of the left kidney and splenic hilum. No pancreatic duct dilation. No additional pancreatic mass. No evidence of pancreas divisum. Spleen: Normal size. Adrenals/Urinary Tract: Normal right adrenal. The left retroperitoneal mass appears to involve the left adrenal gland. No hydronephrosis. Simple appearing 1.6 cm upper right renal cyst. Stomach/Bowel: Small hiatal hernia. Otherwise normal nondistended stomach. Visualized small and large bowel is normal caliber, with no bowel wall thickening. Vascular/Lymphatic: Normal caliber abdominal aorta. Mild left para-aortic adenopathy measuring up to 1.0 cm (series 7/image 54), not definitely changed since 10/29/2015 CT. Other: No abdominal ascites or focal fluid collection. Musculoskeletal: No aggressive appearing focal osseous lesions. IMPRESSION: 1. Poorly marginated 4.6 x 4.3 cm upper left retroperitoneal soft tissue mass centered in the pancreatic tail with involvement/invasion of the splenic hilum, upper pole left kidney and left adrenal gland, new since 10/29/2015 CT. Malignancy is suspected, favor  lymphoma, differential includes primary pancreatic, renal or adrenal malignancy. 2. Numerous (greater than 10) mildly T2 hyperintense liver masses scattered throughout the liver, suspect metastases. 3. Nonspecific mild left para-aortic adenopathy. 4. Medial right middle lobe nodular pulmonary opacity, indeterminate for neoplastic versus inflammatory etiology. Patchy left lower lobe opacity, unchanged, suspect inflammatory etiology. 5. Small dependent bilateral pleural effusions. 6. Small hiatal hernia. Electronically Signed   By: Ilona Sorrel M.D.   On: 06/02/2017 09:01   US Renal  Result Date: 06/02/2017 CLINICAL DATA:  Acute on chronic renal failure.  Nephrolithiasis. EXAM: RENAL / URINARY TRACT ULTRASOUND COMPLETE COMPARISON:  Abdominal MRI dated 06/02/2017 and CT scan of the abdomen and pelvis dated 05/30/2016 FINDINGS: Right Kidney: Length: 7.4 cm. Echogenicity within normal limits. Several small echogenic areas in the right kidney consistent with stones. No solid mass or hydronephrosis visualized. 1.4 cm cyst on the upper pole. Left Kidney: Length: 9.0 cm. Echogenicity within normal limits. No mass or hydronephrosis visualized. The ill-defined soft tissue density adjacent to the upper pole of the left kidney visible on CT scan of 05/30/2017 is  not apparent on this exam. Bladder: Appears normal for degree of bladder distention. Multiple hypoechoic lesions in the liver consistent with metastatic disease are noted. IMPRESSION: 1. Multiple right renal stones. Small cyst in the upper pole the right kidney. 2. Normal appearing left kidney. The small stones visible on CT scan are not apparent on the ultrasound exam. 3. The ill-defined soft tissue mass adjacent to the upper pole of the left kidney on CT scan is not appreciable on this ultrasound. 4. Multiple masses in the liver consistent with metastatic disease. Electronically Signed   By: Lorriane Shire M.D.   On: 06/02/2017 08:45    Orson Eva, DO  [facility] Pager [phone]  If 7PM-7AM, please contact night-coverage [email or web address] Password [email or web address] 06/03/2017, 6:42 PM   LOS: 0 days

## 2017-06-03 NOTE — Discharge Instructions (Signed)
Liver Biopsy, Care After °These instructions give you information on caring for yourself after your procedure. Your doctor may also give you more specific instructions. Call your doctor if you have any problems or questions after your procedure. °Follow these instructions at home: °· Rest at home for 1-2 days or as told by your doctor. °· Have someone stay with you for at least 24 hours. °· Do not do these things in the first 24 hours: °? Drive. °? Use machinery. °? Take care of other people. °? Sign legal documents. °? Take a bath or shower. °· There are many different ways to close and cover a cut (incision). For example, a cut can be closed with stitches, skin glue, or adhesive strips. Follow your doctor's instructions on: °? Taking care of your cut. °? Changing and removing your bandage (dressing). °? Removing whatever was used to close your cut. °· Do not drink alcohol in the first week. °· Do not lift more than 5 pounds or play contact sports for the first 2 weeks. °· Take medicines only as told by your doctor. For 1 week, do not take medicine that has aspirin in it or medicines like ibuprofen. °· Get your test results. °Contact a doctor if: °· A cut bleeds and leaves more than just a small spot of blood. °· A cut is red, puffs up (swells), or hurts more than before. °· Fluid or something else comes from a cut. °· A cut smells bad. °· You have a fever or chills. °Get help right away if: °· You have swelling, bloating, or pain in your belly (abdomen). °· You get dizzy or faint. °· You have a rash. °· You feel sick to your stomach (nauseous) or throw up (vomit). °· You have trouble breathing, feel short of breath, or feel faint. °· Your chest hurts. °· You have problems talking or seeing. °· You have trouble balancing or moving your arms or legs. °This information is not intended to replace advice given to you by your health care provider. Make sure you discuss any questions you have with your health care  provider. °Document Released: 02/06/2008 Document Revised: 10/05/2015 Document Reviewed: 06/25/2013 °[employer] Interactive Patient Education © 2018 [employer] ° ° ° ° °Moderate Conscious Sedation, Adult, Care After °These instructions provide you with information about caring for yourself after your procedure. Your health care provider may also give you more specific instructions. Your treatment has been planned according to current medical practices, but problems sometimes occur. Call your health care provider if you have any problems or questions after your procedure. °What can I expect after the procedure? °After your procedure, it is common: °· To feel sleepy for several hours. °· To feel clumsy and have poor balance for several hours. °· To have poor judgment for several hours. °· To vomit if you eat too soon. ° °Follow these instructions at home: °For at least 24 hours after the procedure: ° °· Do not: °? Participate in activities where you could fall or become injured. °? Drive. °? Use heavy machinery. °? Drink alcohol. °? Take sleeping pills or medicines that cause drowsiness. °? Make important decisions or sign legal documents. °? Take care of children on your own. °· Rest. °Eating and drinking °· Follow the diet recommended by your health care provider. °· If you vomit: °? Drink water, juice, or soup when you can drink without vomiting. °? Make sure you have little or no nausea before eating solid foods. °General instructions °·   Have a responsible adult stay with you until you are awake and alert. °· Take over-the-counter and prescription medicines only as told by your health care provider. °· If you smoke, do not smoke without supervision. °· Keep all follow-up visits as told by your health care provider. This is important. °Contact a health care provider if: °· You keep feeling nauseous or you keep vomiting. °· You feel light-headed. °· You develop a rash. °· You have a fever. °Get help right away  if: °· You have trouble breathing. °This information is not intended to replace advice given to you by your health care provider. Make sure you discuss any questions you have with your health care provider. °Document Released: 02/17/2013 Document Revised: 10/02/2015 Document Reviewed: 08/19/2015 °[employer] Interactive Patient Education © 2018 [employer] ° ° °

## 2017-06-03 NOTE — Consult Note (Signed)
Chief Complaint: Patient was seen in consultation today for liver lesion biopsy at the request of Tat,David  Referring Physician(s): Tat,David  Supervising Physician: Marybelle Killings  Patient Status: [facility] IP  History of Present Illness: Tiffany [name] is a 77 y.o. female   Presented to ED with abd pain; rib pain Has hx Heimlich maneuver 0/53/97 Denies N/V  CT 05/30/17: IMPRESSION: 1. Negative for pneumothorax or pleural effusion. No definite acute displaced rib fracture is seen. 2. Interim development of multiple ill-defined hypodense liver masses, concerning for metastatic disease. 3. Ill-defined masslike process in the left upper quadrant, between the upper pole of left kidney and spleen, uncertain if this is related to the tail of the pancreas or intraperitoneal inflammatory process. Metastatic disease is also considered given the new findings of multiple liver masses. Contrast-enhanced examination would be helpful to further evaluate. 4. Similar appearance of mediastinal, upper abdominal and retroperitoneal mild adenopathy. 5. Multifocal areas of bronchiectasis and parenchymal scarring in the lungs. Mild mucous plugging in the left lower lobe with mild ground-glass density in the right middle and left lower lobes, findings could be secondary to chronic infection such as MAI. 6. Multiple stones within the bilateral kidneys.  No hydronephrosis.  MRI yesterday: IMPRESSION: 1. Poorly marginated 4.6 x 4.3 cm upper left retroperitoneal soft tissue mass centered in the pancreatic tail with involvement/invasion of the splenic hilum, upper pole left kidney and left adrenal gland, new since 10/29/2015 CT. 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 2. Numerous (greater than 10) mildly T2 hyperintense liver masses scattered throughout the liver, suspect metastases. 3. Nonspecific mild left para-aortic  adenopathy. 4. Medial right middle lobe nodular pulmonary opacity, indeterminate for neoplastic versus inflammatory etiology. Patchy left lower lobe opacity, unchanged, suspect inflammatory etiology. 5. Small dependent bilateral pleural effusions. 6. Small hiatal hernia.  Tumor markers wnl Need for tissue diagnosis Hx Lymphoma in past   Past Medical History:  Diagnosis Date  . Anemia   . CAD (coronary artery disease)    DES to mid RCA 2006  . Cardiomyopathy ([place])   . Chronic lower back pain   . CKD (chronic kidney disease) stage 3, GFR 30-59 ml/min (HCC)   . COPD (chronic obstructive pulmonary disease) ([place]) Dx'd 09/28/2015  . Depression   . Dysphagia   . History of blood transfusion 09/2015  . Hyperlipidemia   . Kidney stones   . Lymphoma ([place])   . Orthostatic hypotension   . PMB (postmenopausal bleeding) 06/12/2015  . Pneumonia 09/28/2015  . Rheumatoid arthritis ([place])   . Sjogren's syndrome ([place])   . Syncope   . Systemic lupus erythematosus ([place])     Past Surgical History:  Procedure Laterality Date  . BREAST BIOPSY Left   . CARDIAC CATHETERIZATION N/A 10/06/2015   Procedure: Right/Left Heart Cath and Coronary Angiography;  Surgeon: Leonie Man, MD;  Location: [facility];  Service: Cardiovascular;  Laterality: N/A;  . CATARACT EXTRACTION W/ INTRAOCULAR LENS  IMPLANT, BILATERAL Bilateral   . CORONARY ANGIOPLASTY WITH STENT PLACEMENT  2006   DES to mid RCA   . DILATION AND CURETTAGE OF UTERUS    . EYE SURGERY    . LAPAROSCOPIC CHOLECYSTECTOMY    . LITHOTRIPSY  X 3  . LYMPH NODE DISSECTION Left ~ 2000   "neck"  . MASS EXCISION Left ~ 2000   Lymphoma  . RETINAL DETACHMENT SURGERY Right   . TONSILLECTOMY Left ~ 2000  . TUBAL  LIGATION      Allergies: Florinef [fludrocortisone acetate]; Iohexol; Sulfonamide derivatives; and Tape  Medications: Prior to Admission medications   Medication Sig Start Date End Date Taking? Authorizing Provider  albuterol  (PROVENTIL) (2.5 MG/3ML) 0.083% nebulizer solution Take 3 mLs (2.5 mg total) by nebulization every 6 (six) hours as needed for wheezing or shortness of breath. 09/18/15   Robbie Lis, MD  aspirin 81 MG tablet Take 81 mg by mouth daily.    [provider]  atorvastatin (LIPITOR) 40 MG tablet Take 1 tablet (40 mg total) by mouth daily. 03/04/17   Eustaquio Maize, MD  Cholecalciferol (VITAMIN D) 400 UNITS capsule Take 400 Units by mouth every evening.     [provider]  clobetasol cream (TEMOVATE) 0.10 % Apply 1 application topically 2 (two) times daily.    [provider]  cycloSPORINE (RESTASIS) 0.05 % ophthalmic emulsion Place 1 drop into both eyes 2 (two) times daily.    [provider]  diazepam (VALIUM) 2 MG tablet One tab in the morning, 1-2 tabs at night 03/04/17   Eustaquio Maize, MD  FERREX 150 150 MG capsule TAKE 1 CAPSULE (150 MG TOTAL) BY MOUTH DAILY. 01/15/17   Eustaquio Maize, MD  folic acid (FOLVITE) 1 MG tablet TAKE 1 TABLET (1 MG TOTAL) BY MOUTH EVERY MORNING. 02/03/17   Eustaquio Maize, MD  HYDROcodone-acetaminophen (NORCO/VICODIN) 5-325 MG tablet Take 1 tablet by mouth every 12 (twelve) hours as needed for moderate pain. 03/04/17   Eustaquio Maize, MD  hydroxychloroquine (PLAQUENIL) 200 MG tablet TAKE 2 TABLET WITH FOOD OR MILK ONCE A DAY ORALLY 30 DAYS 04/08/17   Eustaquio Maize, MD  ketoconazole (NIZORAL) 2 % cream Apply 1 application topically daily. Use on corner of mouth 01/30/17   Eustaquio Maize, MD  midodrine (PROAMATINE) 10 MG tablet TAKE 2 TABLETS EVERY MORNING AND TAKE 1 TABLET EVERY EVENING 03/13/17   Eustaquio Maize, MD  mirtazapine (REMERON) 15 MG tablet Take 1 tablet (15 mg total) by mouth at bedtime. 03/04/17   Eustaquio Maize, MD  Multiple Vitamins-Minerals (PRESERVISION/LUTEIN PO) Take 1 capsule by mouth daily.    [provider]  nitroGLYCERIN (NITROSTAT) 0.4 MG SL tablet 1 TABLET UNDER TONGUE AT ONSET OF CHEST  PAIN YOU MAY REPEAT EVERY 5 MINUTES FOR UP TO 3 DOSES. 04/23/17   [provider]  omeprazole (PRILOSEC) 20 MG capsule TAKE 1 CAPSULE BY MOUTH TWICE A DAY 04/23/17   Terald Sleeper, PA-C  polyethylene glycol (MIRALAX / GLYCOLAX) packet Take 17 g by mouth daily. 06/03/17   Orson Eva, MD  senna-docusate (SENOKOT-S) 8.6-50 MG tablet Take 1 tablet by mouth 2 (two) times daily. For constipation. 11/03/15   Rexene Alberts, MD  sodium bicarbonate 325 MG tablet Take 2 tablets (650 mg total) by mouth 3 (three) times daily. 03/04/17   Eustaquio Maize, MD  Travoprost, BAK Free, (TRAVATAN) 0.004 % SOLN ophthalmic solution Place 1 drop into both eyes at bedtime.    [provider]     Family History  Problem Relation Age of Onset  . Coronary artery disease Father   . Coronary artery disease Mother   . Liver disease Mother   . Esophageal cancer Brother   . Breast cancer Sister     Social History   Socioeconomic History  . Marital status: Divorced    Spouse name: Not on file  . Number of children: Not on file  .  Years of education: Not on file  . Highest education level: Not on file  Social Needs  . Financial resource strain: Not on file  . Food insecurity - worry: Not on file  . Food insecurity - inability: Not on file  . Transportation needs - medical: Not on file  . Transportation needs - non-medical: Not on file  Occupational History  . Occupation: Disabled and retired  Tobacco Use  . Smoking status: Current Every Day Smoker    Packs/day: 0.25    Years: 23.00    Pack years: 5.75    Types: Cigarettes    Start date: 07/14/1986    Last attempt to quit: 02/25/2017    Years since quitting: 0.2  . Smokeless tobacco: Never Used  Substance and Sexual Activity  . Alcohol use: No    Alcohol/week: 0.0 oz  . Drug use: No  . Sexual activity: No    Birth control/protection: Post-menopausal  Other Topics Concern  . Not on file  Social History Narrative  . Not on file     Review of Systems: A 12 point ROS discussed and pertinent positives are indicated in the HPI above.  All other systems are negative.  Review of Systems  Constitutional: Positive for activity change, appetite change and fatigue. Negative for fever and unexpected weight change.  Respiratory: Negative for cough and shortness of breath.   Cardiovascular: Negative for chest pain.  Gastrointestinal: Positive for abdominal distention and abdominal pain.  Musculoskeletal: Positive for back pain and gait problem.  Neurological: Positive for weakness.  Psychiatric/Behavioral: Negative for behavioral problems and confusion.    Vital Signs: There were no vitals taken for this visit.  Physical Exam  Constitutional: She is oriented to person, place, and time.  Thin;frail Ill appearing  Cardiovascular:  Irreg rate  Pulmonary/Chest: Effort normal and breath sounds normal.  Abdominal: Soft. She exhibits distension. There is tenderness.  Musculoskeletal: Normal range of motion.  Neurological: She is alert and oriented to person, place, and time.  Skin: Skin is warm and dry.  Psychiatric: She has a normal mood and affect. Her behavior is normal. Judgment and thought content normal.  Nursing note and vitals reviewed.   Imaging: Ct Abdomen Pelvis Wo Contrast  Result Date: 05/30/2017 CLINICAL DATA:  Chest pain pain to the right rib and abdominal area EXAM: CT CHEST, ABDOMEN AND PELVIS WITHOUT CONTRAST TECHNIQUE: Multidetector CT imaging of the chest, abdomen and pelvis was performed following the standard protocol without IV contrast. COMPARISON:  CT abdomen pelvis 10/29/2015, 09/08/2015, CT chest 09/06/2011; chest radiograph 05/30/2017 FINDINGS: CT CHEST FINDINGS Cardiovascular: Limited evaluation without intravenous contrast. Nonaneurysmal aorta. Moderate aortic atherosclerotic calcifications. Coronary artery calcification. Normal heart size. No pericardial effusion Mediastinum/Nodes: Midline  trachea. No thyroid mass. 12 mm pretracheal lymph node, similar compared to 2013 CT chest. Esophagus shows a small distal hiatal hernia Lungs/Pleura: Bronchiectasis in the left upper lobe with parenchymal scarring. Scattered scarring at the right apex. Mild bronchiectasis in the right middle lobe and bilateral lower lobes with mild mucous plugging in the left lower lobe dilated bronchi scattered ground-glass densities in the left lower lobe and right middle lobe. Negative for pneumothorax or pleural effusion Musculoskeletal: Degenerative changes of the spine. No acute or suspicious bone lesion CT ABDOMEN PELVIS FINDINGS Hepatobiliary: Interim development of multiple hypodense liver masses. Surgical clips at the gallbladder fossa. No biliary dilatation Pancreas: Atrophic.  No inflammation Spleen: Normal in size without focal abnormality. Adrenals/Urinary Tract: Adrenal glands are within normal limits. Multiple  stones are present within both kidneys, the largest on the right is seen in the upper pole and measures 7 mm, the largest on the left is seen in the lower pole and measures 6 mm. Cyst in the upper pole of the left kidney. Scarring in the bilateral kidneys. No hydronephrosis. Lobulated contour of the mid right kidney. Bladder unremarkable Stomach/Bowel: The stomach is nonenlarged. No dilated small bowel. No colon wall thickening. Vascular/Lymphatic: Extensive aortic atherosclerosis. No aneurysmal dilatation. Lymph nodes adjacent to the GE junction unchanged. Multiple upper abdominal and periaortic lymph nodes, similar compared to the prior CT. 2.1 by 0.9 cm left periaortic lymph node, series 8, image number 24 is unchanged. Reproductive: Uterus is unremarkable.  No adnexal masses Other: Negative for free air or ascites. Ill-defined masslike process within the left upper quadrant of the abdomen between the spleen and upper pole of the left kidney. This is not seen on the prior study. Musculoskeletal: Degenerative  changes of the spine. No suspicious bone lesions. 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 IMPRESSION: 1. Negative for pneumothorax or pleural effusion. No definite acute displaced rib fracture is seen. 2. Interim development of multiple ill-defined hypodense liver masses, concerning for metastatic disease. 3. Ill-defined masslike process in the left upper quadrant, between the upper pole of left kidney and spleen, uncertain if this is related to the tail of the pancreas or intraperitoneal inflammatory process. Metastatic disease is also considered given the new findings of multiple liver masses. Contrast-enhanced examination would be helpful to further evaluate. 4. Similar appearance of mediastinal, upper abdominal and retroperitoneal mild adenopathy. 5. Multifocal areas of bronchiectasis and parenchymal scarring in the lungs. Mild mucous plugging in the left lower lobe with mild ground-glass density in the right middle and left lower lobes, findings could be secondary to chronic infection such as MAI. 6. Multiple stones within the bilateral kidneys.  No hydronephrosis. Electronically Signed   By: Donavan Foil M.D.   On: 05/30/2017 18:38   Dg Chest 2 View  Result Date: 05/30/2017 CLINICAL DATA:  Bilateral lower rib pain. EXAM: CHEST  2 VIEW COMPARISON:  11/22/2015 FINDINGS: Cardiomediastinal silhouette is normal. Mediastinal contours appear intact. Calcific atherosclerotic disease of the aorta. There is no evidence of focal airspace consolidation, pleural effusion or pneumothorax. Chronic hyperinflation of the lungs with upper lobe predominant emphysematous changes and mild chronic interstitial lung changes. Bilateral apical pleural/subpleural linear and nodular thickening. Osseous structures are without acute abnormality. No displaced rib fractures are seen. Soft tissues are grossly normal. IMPRESSION: No displaced rib fractures are seen. Chronic changes of COPD. Calcific atherosclerotic disease of the aorta.  Electronically Signed   By: Fidela Salisbury M.D.   On: 05/30/2017 15:33   Ct Chest Wo Contrast  Result Date: 05/30/2017 CLINICAL DATA:  Chest pain pain to the right rib and abdominal area EXAM: CT CHEST, ABDOMEN AND PELVIS WITHOUT CONTRAST TECHNIQUE: Multidetector CT imaging of the chest, abdomen and pelvis was performed following the standard protocol without IV contrast. COMPARISON:  CT abdomen pelvis 10/29/2015, 09/08/2015, CT chest 09/06/2011; chest radiograph 05/30/2017 FINDINGS: CT CHEST FINDINGS Cardiovascular: Limited evaluation without intravenous contrast. Nonaneurysmal aorta. Moderate aortic atherosclerotic calcifications. Coronary artery calcification. Normal heart size. No pericardial effusion Mediastinum/Nodes: Midline trachea. No thyroid mass. 12 mm pretracheal lymph node, similar compared to 2013 CT chest. Esophagus shows a small distal hiatal hernia Lungs/Pleura: Bronchiectasis in the left upper lobe with parenchymal scarring. Scattered scarring at the right apex. Mild bronchiectasis in the right middle lobe and bilateral lower  lobes with mild mucous plugging in the left lower lobe dilated bronchi scattered ground-glass densities in the left lower lobe and right middle lobe. Negative for pneumothorax or pleural effusion Musculoskeletal: Degenerative changes of the spine. No acute or suspicious bone lesion CT ABDOMEN PELVIS FINDINGS Hepatobiliary: Interim development of multiple hypodense liver masses. Surgical clips at the gallbladder fossa. No biliary dilatation Pancreas: Atrophic.  No inflammation Spleen: Normal in size without focal abnormality. Adrenals/Urinary Tract: Adrenal glands are within normal limits. Multiple stones are present within both kidneys, the largest on the right is seen in the upper pole and measures 7 mm, the largest on the left is seen in the lower pole and measures 6 mm. Cyst in the upper pole of the left kidney. Scarring in the bilateral kidneys. No hydronephrosis.  Lobulated contour of the mid right kidney. Bladder unremarkable Stomach/Bowel: The stomach is nonenlarged. No dilated small bowel. No colon wall thickening. Vascular/Lymphatic: Extensive aortic atherosclerosis. No aneurysmal dilatation. Lymph nodes adjacent to the GE junction unchanged. Multiple upper abdominal and periaortic lymph nodes, similar compared to the prior CT. 2.1 by 0.9 cm left periaortic lymph node, series 8, image number 24 is unchanged. Reproductive: Uterus is unremarkable.  No adnexal masses Other: Negative for free air or ascites. Ill-defined masslike process within the left upper quadrant of the abdomen between the spleen and upper pole of the left kidney. This is not seen on the prior study. Musculoskeletal: Degenerative changes of the spine. No suspicious bone lesions. 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 IMPRESSION: 1. Negative for pneumothorax or pleural effusion. No definite acute displaced rib fracture is seen. 2. Interim development of multiple ill-defined hypodense liver masses, concerning for metastatic disease. 3. Ill-defined masslike process in the left upper quadrant, between the upper pole of left kidney and spleen, uncertain if this is related to the tail of the pancreas or intraperitoneal inflammatory process. Metastatic disease is also considered given the new findings of multiple liver masses. Contrast-enhanced examination would be helpful to further evaluate. 4. Similar appearance of mediastinal, upper abdominal and retroperitoneal mild adenopathy. 5. Multifocal areas of bronchiectasis and parenchymal scarring in the lungs. Mild mucous plugging in the left lower lobe with mild ground-glass density in the right middle and left lower lobes, findings could be secondary to chronic infection such as MAI. 6. Multiple stones within the bilateral kidneys.  No hydronephrosis. Electronically Signed   By: Donavan Foil M.D.   On: 05/30/2017 18:38   Mr Abdomen Wo Contrast  Result Date:  06/02/2017 CLINICAL DATA:  Multiple new liver masses on recent unenhanced CT abdomen/pelvis. EXAM: MRI ABDOMEN WITHOUT CONTRAST TECHNIQUE: Multiplanar multisequence MR imaging was performed without the administration of intravenous contrast. COMPARISON:  05/30/2017 unenhanced CT abdomen/pelvis. FINDINGS: Lower chest: Small dependent bilateral pleural effusions. There is a 2.6 x 2.5 cm nodular opacity in the medial right middle lobe (series 4/image 7). Patchy left lower lobe opacity is not convincingly changed from recent chest CT. Hepatobiliary: No hepatic steatosis. There are numerous (greater than 10) similar-appearing masses of various sizes scattered throughout the liver, each demonstrating mild T2 hyperintensity and T1 hypointensity with mildly restricted diffusion. Representative 2.9 x 2.0 cm peripheral right liver lobe mass (series 4/image 16) and 2.7 x 2.5 cm anterior left liver lobe mass (series 4/image 23). These masses all appear new since 10/29/2015 CT. Cholecystectomy. Bile ducts are within normal post cholecystectomy limits. Common bile duct diameter 5 mm. No evidence of choledocholithiasis. Pancreas: There is a poorly marginated 4.6 x 4.3  cm upper left retroperitoneal soft tissue mass centered in the pancreatic tail (series 14/image 17), new since 10/29/2015 CT, which demonstrates restricted diffusion and mild T2 hyperintensity and T1 hypointensity compared to the spleen, which may invade upper pole of the left kidney and splenic hilum. No pancreatic duct dilation. No additional pancreatic mass. No evidence of pancreas divisum. Spleen: Normal size. Adrenals/Urinary Tract: Normal right adrenal. The left retroperitoneal mass appears to involve the left adrenal gland. No hydronephrosis. Simple appearing 1.6 cm upper right renal cyst. Stomach/Bowel: Small hiatal hernia. Otherwise normal nondistended stomach. Visualized small and large bowel is normal caliber, with no bowel wall thickening.  Vascular/Lymphatic: Normal caliber abdominal aorta. Mild left para-aortic adenopathy measuring up to 1.0 cm (series 7/image 54), not definitely changed since 10/29/2015 CT. Other: No abdominal ascites or focal fluid collection. Musculoskeletal: No aggressive appearing focal osseous lesions. IMPRESSION: 1. Poorly marginated 4.6 x 4.3 cm upper left retroperitoneal soft tissue mass centered in the pancreatic tail with involvement/invasion of the splenic hilum, upper pole left kidney and left adrenal gland, new since 10/29/2015 CT. 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 2. Numerous (greater than 10) mildly T2 hyperintense liver masses scattered throughout the liver, suspect metastases. 3. Nonspecific mild left para-aortic adenopathy. 4. Medial right middle lobe nodular pulmonary opacity, indeterminate for neoplastic versus inflammatory etiology. Patchy left lower lobe opacity, unchanged, suspect inflammatory etiology. 5. Small dependent bilateral pleural effusions. 6. Small hiatal hernia. Electronically Signed   By: Ilona Sorrel M.D.   On: 06/02/2017 09:01   US Renal  Result Date: 06/02/2017 CLINICAL DATA:  Acute on chronic renal failure.  Nephrolithiasis. EXAM: RENAL / URINARY TRACT ULTRASOUND COMPLETE COMPARISON:  Abdominal MRI dated 06/02/2017 and CT scan of the abdomen and pelvis dated 05/30/2016 FINDINGS: Right Kidney: Length: 7.4 cm. Echogenicity within normal limits. Several small echogenic areas in the right kidney consistent with stones. No solid mass or hydronephrosis visualized. 1.4 cm cyst on the upper pole. Left Kidney: Length: 9.0 cm. Echogenicity within normal limits. No mass or hydronephrosis visualized. The ill-defined soft tissue density adjacent to the upper pole of the left kidney visible on CT scan of 05/30/2017 is not apparent on this exam. Bladder: Appears normal for degree of bladder distention. Multiple hypoechoic  lesions in the liver consistent with metastatic disease are noted. IMPRESSION: 1. Multiple right renal stones. Small cyst in the upper pole the right kidney. 2. Normal appearing left kidney. The small stones visible on CT scan are not apparent on the ultrasound exam. 3. The ill-defined soft tissue mass adjacent to the upper pole of the left kidney on CT scan is not appreciable on this ultrasound. 4. Multiple masses in the liver consistent with metastatic disease. Electronically Signed   By: Lorriane Shire M.D.   On: 06/02/2017 08:45    Labs:  CBC: Recent Labs    07/03/16 1015 12/05/16 1534 05/30/17 1457 05/31/17 0707  WBC 6.8 6.4 7.1 4.2  HGB 11.1 11.6 9.3* 8.2*  HCT 33.2* 35.6 30.0* 26.5*  PLT 195 168 174 152    COAGS: Recent Labs    06/02/17 1235  INR 1.11    BMP: Recent Labs    05/31/17 0707 06/01/17 0610 06/02/17 0448 06/03/17 0537  NA 137 137 135 134*  K 3.9 4.6 5.1 4.5  CL 104 105 107 107  CO2 22 21* 20* 17*  GLUCOSE 90 87 77 76  BUN 37* 34* 30* 28*  CALCIUM 10.1 10.1 9.9  9.1  CREATININE 2.50* 2.50* 2.33* 2.31*  GFRNONAA 18* 18* 19* 20*  GFRAA 21* 21* 22* 23*    LIVER FUNCTION TESTS: Recent Labs    05/31/17 0707 06/01/17 0610 06/02/17 0448 06/03/17 0537  BILITOT 0.4 0.4 0.7 0.7  AST 58* 62* 85* 61*  ALT 38 43 60* 52  ALKPHOS 152* 141* 144* 124  PROT 6.2* 6.3* 6.5 5.5*  ALBUMIN 2.7* 2.6* 2.7* 2.2*    TUMOR MARKERS: No results for input(s): AFPTM, CEA, CA199, CHROMGRNA in the last 8760 hours.  Assessment and Plan:  abd pain; +distension Imaging reveals panc mass; liver lesions Scheduled for liver lesion biopsy in IR Risks and benefits discussed with the patient including, but not limited to bleeding, infection, damage to adjacent structures or low yield requiring additional tests. All of the patient's questions were answered, patient is agreeable to proceed. Consent signed and in chart.  Thank you for this interesting consult.  I greatly  enjoyed meeting Tiffany [name] and look forward to participating in their care.  A copy of this report was sent to the requesting provider on this date.  Electronically Signed: Lavonia Drafts, PA-C 06/03/2017, 10:34 AM   I spent a total of 40 Minutes    in face to face in clinical consultation, greater than 50% of which was counseling/coordinating care for liver lesion biospy

## 2017-06-03 NOTE — Care Management Note (Signed)
Case Management Note  Patient Details  Name: Tiffany [name] MRN: [id number] Date of Birth: 07/24/1940  Expected Discharge Date:  06/02/17               Expected Discharge Plan:  Home/Self Care  In-House Referral:  NA  Discharge planning Services  CM Consult  Post Acute Care Choice:  NA Choice offered to:  NA  Status of Service:  Completed, signed off   Additional Comments: Plan for DC home today. Per Dr. Carles Collet drop in hgb r/t hemodilution. Pt needs liver biopsy as soon as possible. CM has contacted pt's PCP office and requested urgent f/u. Pt scheduled to see PCP tomorrow at 2pm. CM has made pt aware.   Sherald Barge, RN 06/03/2017, 3:17 PM

## 2017-06-03 NOTE — Progress Notes (Signed)
Patient ID: Tiffany [name], female   DOB: [birth year], 77 y.o.   MRN: [id number]   Pt was scheduled at [facility] IR for liver lesion biopsy today  Noted was Hg 7.6 today (12/05/16  11.6;  05/30/17  9.3; 05/31/17  8.2) This makes pt not a safe candidate for percutaneous liver biopsy  Dr Barbie Banner did not perform biopsy today He spoke to Dr Tat  Rec: anemia work up When Hg stabilizes - please re order and  IR can bx at that time

## 2017-06-03 NOTE — Progress Notes (Signed)
PT NPO since MN. CHG wipes administered. PT fluids infusing. Pain medication given for pain. Held blood thinners and Aspirin for procedure. Paperwork printed and placed in folder for transport for IR at [facility] in [place]. Continue to monitor.

## 2017-06-04 ENCOUNTER — Ambulatory Visit (INDEPENDENT_AMBULATORY_CARE_PROVIDER_SITE_OTHER): Payer: Medicare Other | Admitting: Pediatrics

## 2017-06-04 ENCOUNTER — Encounter: Payer: Self-pay | Admitting: Pediatrics

## 2017-06-04 VITALS — BP 139/75 | HR 69 | Temp 97.9°F | Ht 70.0 in | Wt 143.4 lb

## 2017-06-04 DIAGNOSIS — N185 Chronic kidney disease, stage 5: Secondary | ICD-10-CM

## 2017-06-04 DIAGNOSIS — N179 Acute kidney failure, unspecified: Secondary | ICD-10-CM | POA: Diagnosis not present

## 2017-06-04 DIAGNOSIS — R160 Hepatomegaly, not elsewhere classified: Secondary | ICD-10-CM | POA: Diagnosis not present

## 2017-06-04 DIAGNOSIS — N170 Acute kidney failure with tubular necrosis: Secondary | ICD-10-CM

## 2017-06-04 DIAGNOSIS — N189 Chronic kidney disease, unspecified: Secondary | ICD-10-CM | POA: Diagnosis not present

## 2017-06-04 DIAGNOSIS — D649 Anemia, unspecified: Secondary | ICD-10-CM

## 2017-06-04 DIAGNOSIS — R1010 Upper abdominal pain, unspecified: Secondary | ICD-10-CM | POA: Diagnosis not present

## 2017-06-04 LAB — CBC
HCT: 35.4 % — ABNORMAL LOW (ref 36.0–46.0)
HEMATOCRIT: 36.5 % (ref 36.0–46.0)
HEMOGLOBIN: 11.3 g/dL — AB (ref 12.0–15.0)
Hemoglobin: 11 g/dL — ABNORMAL LOW (ref 12.0–15.0)
MCH: 29.6 pg (ref 26.0–34.0)
MCH: 29.4 pg (ref 26.0–34.0)
MCHC: 31.1 g/dL (ref 30.0–36.0)
MCHC: 31 g/dL (ref 30.0–36.0)
MCV: 95.2 fL (ref 78.0–100.0)
MCV: 95.1 fL (ref 78.0–100.0)
PLATELETS: 160 10*3/uL (ref 150–400)
Platelets: 168 10*3/uL (ref 150–400)
RBC: 3.72 MIL/uL — AB (ref 3.87–5.11)
RBC: 3.84 MIL/uL — AB (ref 3.87–5.11)
RDW: 17.5 % — AB (ref 11.5–15.5)
RDW: 17.8 % — ABNORMAL HIGH (ref 11.5–15.5)
WBC: 4.8 10*3/uL (ref 4.0–10.5)
WBC: 5.3 10*3/uL (ref 4.0–10.5)

## 2017-06-04 LAB — COMPREHENSIVE METABOLIC PANEL
ALT: 61 U/L — AB (ref 14–54)
ANION GAP: 10 (ref 5–15)
AST: 70 U/L — ABNORMAL HIGH (ref 15–41)
Albumin: 2.3 g/dL — ABNORMAL LOW (ref 3.5–5.0)
Alkaline Phosphatase: 129 U/L — ABNORMAL HIGH (ref 38–126)
BUN: 26 mg/dL — ABNORMAL HIGH (ref 6–20)
CHLORIDE: 110 mmol/L (ref 101–111)
CO2: 18 mmol/L — AB (ref 22–32)
CREATININE: 2.13 mg/dL — AB (ref 0.44–1.00)
Calcium: 9.6 mg/dL (ref 8.9–10.3)
GFR calc Af Amer: 25 mL/min — ABNORMAL LOW (ref >60)
GFR, EST NON AFRICAN AMERICAN: 22 mL/min — AB (ref >60)
Glucose, Bld: 75 mg/dL (ref 65–99)
POTASSIUM: 4.5 mmol/L (ref 3.5–5.1)
SODIUM: 138 mmol/L (ref 135–145)
Total Bilirubin: 0.4 mg/dL (ref 0.3–1.2)
Total Protein: 5.7 g/dL — ABNORMAL LOW (ref 6.5–8.1)

## 2017-06-04 NOTE — Progress Notes (Addendum)
Patient briefly seen and examined. No new events. No complaints. Appears generally well, altho overall is frail. Received 2 units of PRBCs on 1/22. CBC post transfusion pending. As long as Hb is appropriate, plan for DC today as planned for OP appointment scheduled for 2 pm today. No changes to DC Summary by Dr. Carles Collet dated 1/22.  Addendum: Posttransfusion CBC is 11.3 which is inconsistent with hemoglobin of 7.6 reported yesterday.  In any case, patient remains stable for discharge.   Domingo Mend, MD [facility] Pager: [phone]

## 2017-06-04 NOTE — Progress Notes (Signed)
Patient is to be discharged home and in stable condition. Patient's IV and telemetry removed, WNL. Patient given discharge instructions and verbalized understanding. Patient will be escorted out by staff via wheelchair when family present.   Celestia Khat, RN

## 2017-06-04 NOTE — Progress Notes (Signed)
  Subjective:   Patient ID: Tiffany [name], female    DOB: [birth year], 77 y.o.   MRN: [id number] CC: Hospitalization Follow-up (chest pain, abd pain)  HPI: Tiffany [name] is a 77 y.o. female presenting for Hospitalization Follow-up (chest pain, abd pain)  Patient was admitted January 18 and discharged this morning for abdominal pain and chest pain, found to have multiple masses in her liver and a left retroperitoneal soft tissue mass centered in the pancreatic tail.  Had a liver biopsy scheduled twice, had to be delayed due to decreasing hemoglobin.  Patient was also treated for acute on chronic kidney injury.  Today she says she is feeling okay, still with some swelling in both of her ankles.  Since getting admitted to the hospital she has felt slightly more short of breath when she takes deep breaths than usual.  She is here today with her daughter.  She has been prescribed Percocet from the hospital, was taking it while in the hospital and this did help control her ongoing pain fairly well.  She stopped taking both mirtazapine and the Valium, says they were not working.  Relevant past medical, surgical, family and social history reviewed. Allergies and medications reviewed and updated. Social History   Tobacco Use  Smoking Status Current Every Day Smoker  . Packs/day: 0.25  . Years: 23.00  . Pack years: 5.75  . Types: Cigarettes  . Start date: 07/14/1986  . Last attempt to quit: 02/25/2017  . Years since quitting: 0.2  Smokeless Tobacco Never Used   ROS: Per HPI   Objective:    BP 139/75   Pulse 69   Temp 97.9 F (36.6 C) (Oral)   Ht 5\' 10"  (1.778 m)   Wt 143 lb 6.4 oz (65 kg)   BMI 20.58 kg/m   Wt Readings from Last 3 Encounters:  06/04/17 143 lb 6.4 oz (65 kg)  05/30/17 126 lb (57.2 kg)  03/04/17 126 lb (57.2 kg)    Gen: NAD, alert, thin, chronically ill-appearing, cooperative with exam, NCAT EYES: EOMI, no conjunctival injection, or no icterus ENT:  OP without  erythema CV: NRRR, normal S1/S2 Resp: moving air well, no wheezes, normal WOB Abd: +BS, soft, NTND. no guarding or organomegaly Ext: 1+ pitting edema left ankle, trace pitting edema right ankle, warm Neuro: Alert and oriented MSK: normal muscle bulk  Assessment & Plan:  Tiffany [name] was seen today for hospitalization follow-up, seen for acute on chronic kidney injury and liver masses.  Diagnoses and all orders for this visit:  Liver mass Liver biopsy ordered, sent to review with radiologist, they said they would call her with appointment date and time. Pain well controlled at this time with Percocet prescribed in the hospital, will likely need refill on Monday and next office appointment. -     US BIOPSY (LIVER); Future  Anemia, unspecified type Hemoglobin this morning 11.3.  Multiple negative FOBT's.  Will recheck hemoglobin in 5 days at next visit.  Chronic kidney disease, unspecified CKD stage Creatinine this morning stable, slight improvement.  Was 2.13, baseline is 1.6-1.7.  Will recheck next visit in 5 days.  Follow up plan: Return in about 5 days (around 06/09/2017).  Will need blood work  then. Assunta Found, MD [place]

## 2017-06-04 NOTE — Progress Notes (Deleted)
Patient refused to bath this AM.  Patient also refused CHG this AM.  Will pass to the oncoming nurse.

## 2017-06-04 NOTE — Progress Notes (Signed)
When offered a bath the patient stated "not right now, maybe later'.  Refused CHG until bath.  Will pass to oncoming nurse.

## 2017-06-05 ENCOUNTER — Ambulatory Visit: Payer: Medicare Other | Admitting: Pediatrics

## 2017-06-05 LAB — TYPE AND SCREEN
ABO/RH(D): A NEG
Antibody Screen: NEGATIVE
DONOR AG TYPE: NEGATIVE
Donor AG Type: NEGATIVE
UNIT DIVISION: 0
UNIT DIVISION: 0

## 2017-06-05 LAB — BPAM RBC
BLOOD PRODUCT EXPIRATION DATE: 201902192359
BLOOD PRODUCT EXPIRATION DATE: 201902242359
ISSUE DATE / TIME: 201901230456
ISSUE DATE / TIME: 201901230252
UNIT TYPE AND RH: 9500
Unit Type and Rh: 600

## 2017-06-06 ENCOUNTER — Telehealth: Payer: Self-pay | Admitting: Pediatrics

## 2017-06-08 LAB — PTH-RELATED PEPTIDE

## 2017-06-09 ENCOUNTER — Ambulatory Visit (INDEPENDENT_AMBULATORY_CARE_PROVIDER_SITE_OTHER): Payer: Medicare Other | Admitting: Pediatrics

## 2017-06-09 ENCOUNTER — Encounter: Payer: Self-pay | Admitting: Pediatrics

## 2017-06-09 VITALS — BP 126/76 | HR 76 | Temp 97.4°F | Ht 70.0 in | Wt 131.8 lb

## 2017-06-09 DIAGNOSIS — D649 Anemia, unspecified: Secondary | ICD-10-CM

## 2017-06-09 DIAGNOSIS — N189 Chronic kidney disease, unspecified: Secondary | ICD-10-CM | POA: Diagnosis not present

## 2017-06-09 DIAGNOSIS — R52 Pain, unspecified: Secondary | ICD-10-CM

## 2017-06-09 DIAGNOSIS — G4700 Insomnia, unspecified: Secondary | ICD-10-CM | POA: Diagnosis not present

## 2017-06-09 DIAGNOSIS — K5900 Constipation, unspecified: Secondary | ICD-10-CM

## 2017-06-09 MED ORDER — OXYCODONE-ACETAMINOPHEN 5-325 MG PO TABS: 1.0000 | ORAL_TABLET | Freq: Four times a day (QID) | ORAL | 0 refills | Status: AC | PRN

## 2017-06-09 MED ORDER — POLYETHYLENE GLYCOL 3350 17 G PO PACK: 17.0000 g | PACK | Freq: Every day | ORAL | 2 refills | Status: AC

## 2017-06-09 MED ORDER — TRAZODONE HCL 50 MG PO TABS: 25.0000 mg | ORAL_TABLET | Freq: Every evening | ORAL | 3 refills | Status: DC | PRN

## 2017-06-09 MED ORDER — FERREX 150 150 MG PO CAPS: 150.0000 mg | ORAL_CAPSULE | ORAL | 2 refills | Status: AC

## 2017-06-09 NOTE — Telephone Encounter (Signed)
Has appt with me this afternoon

## 2017-06-09 NOTE — Progress Notes (Signed)
Subjective:   Patient ID: Tiffany [name], female    DOB: 1940/12/21, 77 y.o.   MRN: [id number] CC: Follow-up multiple med problems  HPI: Tiffany [name] is a 77 y.o. female presenting for Follow-up  Repeat blood work today Has biopsy scheduled for Friday  Pain: ongoing Has been out of pain medication for past day Here today with her daughter Granddaughter stays with her during the day often as well Both daughter/GD noticed pt slurring her words more when taking 2 tabs of oxycodone at a time Pt says it did help with her pain primarily pain in R side, usually sleeps on R side but cant now due to pain Without medication pain gets up to 10/10 per pt  Insomnia: tried on different medications in the past such as ambien, belsomra  Anemia: has been on oral replacement iron for a while, taking daily  Constipation: miralax in the hospital helped with regular stools  Relevant past medical, surgical, family and social history reviewed. Allergies and medications reviewed and updated. Social History   Tobacco Use  Smoking Status Current Every Day Smoker  . Packs/day: 0.25  . Years: 23.00  . Pack years: 5.75  . Types: Cigarettes  . Start date: 07/14/1986  . Last attempt to quit: 02/25/2017  . Years since quitting: 0.2  Smokeless Tobacco Never Used   ROS: Per HPI   Objective:    BP 126/76   Pulse 76   Temp (!) 97.4 F (36.3 C) (Oral)   Ht '5\' 10"'  (1.778 m)   Wt 131 lb 12.8 oz (59.8 kg)   BMI 18.91 kg/m   Wt Readings from Last 3 Encounters:  06/09/17 131 lb 12.8 oz (59.8 kg)  06/04/17 143 lb 6.4 oz (65 kg)  05/30/17 126 lb (57.2 kg)    Gen: NAD, alert, thin, cooperative with exam, NCAT EYES: EOMI, no conjunctival injection, or no icterus ENT:  OP without erythema LYMPH: no cervical LAD CV: NRRR, normal S1/S2, no murmur, distal pulses 2+ b/l Resp: CTABL, no wheezes, normal WOB Ext: No edema, warm Neuro: Alert and oriented  Assessment & Plan:  [name] was seen today for  follow-up multiple med problems. Has liver biopsy scheduled for Friday per daughter.  Diagnoses and all orders for this visit:  Chronic kidney disease, unspecified CKD stage Recent hospitalization, baseline Cr of 1.6-1.7, dc Cr 2.1, recheck today -     CMP14+EGFR  Anemia, unspecified type Received 2 u pRBC prior to dc, will recheck CBC Decrease iron to three days a week to improve constipation -     CBC with Differential/Platelet -     FERREX 150 150 MG capsule; Take 1 capsule (150 mg total) by mouth every Monday, Wednesday, and Friday.  Pain Ongoing pain R side Liver masses with concern for metastasizing process Keeping her awake at night 2 tabs of oxycodone causes her to slur her words but does improve pain Take 1-1.5 tabs every 6 hours AS NEEDED, do not take if not needed -     oxyCODONE-acetaminophen (PERCOCET/ROXICET) 5-325 MG tablet; Take 1-2 tablets by mouth every 6 (six) hours as needed for moderate pain or severe pain.  Insomnia, unspecified type Trial of below -     traZODone (DESYREL) 50 MG tablet; Take 0.5-1 tablets (25-50 mg total) by mouth at bedtime as needed for sleep.  Constipation, unspecified constipation type Take below daily -     polyethylene glycol (MIRALAX / GLYCOLAX) packet; Take 17 g by mouth daily.  Follow up plan: Return in about 10 days (around 06/19/2017). After liver biopsy. Assunta Found, MD [place]

## 2017-06-10 LAB — CBC WITH DIFFERENTIAL/PLATELET
BASOS: 0 %
Basophils Absolute: 0 10*3/uL (ref 0.0–0.2)
EOS (ABSOLUTE): 0.1 10*3/uL (ref 0.0–0.4)
Eos: 2 %
HEMOGLOBIN: 12.9 g/dL (ref 11.1–15.9)
Hematocrit: 39 % (ref 34.0–46.6)
IMMATURE GRANS (ABS): 0 10*3/uL (ref 0.0–0.1)
IMMATURE GRANULOCYTES: 1 %
LYMPHS: 27 %
Lymphocytes Absolute: 2.1 10*3/uL (ref 0.7–3.1)
MCH: 30.4 pg (ref 26.6–33.0)
MCHC: 33.1 g/dL (ref 31.5–35.7)
MCV: 92 fL (ref 79–97)
MONOCYTES: 11 %
Monocytes Absolute: 0.8 10*3/uL (ref 0.1–0.9)
NEUTROS ABS: 4.6 10*3/uL (ref 1.4–7.0)
NEUTROS PCT: 59 %
PLATELETS: 240 10*3/uL (ref 150–379)
RBC: 4.25 x10E6/uL (ref 3.77–5.28)
RDW: 17.3 % — ABNORMAL HIGH (ref 12.3–15.4)
WBC: 7.6 10*3/uL (ref 3.4–10.8)

## 2017-06-10 LAB — CMP14+EGFR
A/G RATIO: 1 — AB (ref 1.2–2.2)
ALT: 36 IU/L — AB (ref 0–32)
AST: 40 IU/L (ref 0–40)
Albumin: 3.3 g/dL — ABNORMAL LOW (ref 3.5–4.8)
Alkaline Phosphatase: 173 IU/L — ABNORMAL HIGH (ref 39–117)
BUN/Creatinine Ratio: 9 — ABNORMAL LOW (ref 12–28)
BUN: 22 mg/dL (ref 8–27)
Bilirubin Total: 0.3 mg/dL (ref 0.0–1.2)
CO2: 17 mmol/L — AB (ref 20–29)
Calcium: 10.1 mg/dL (ref 8.7–10.3)
Chloride: 104 mmol/L (ref 96–106)
Creatinine, Ser: 2.33 mg/dL — ABNORMAL HIGH (ref 0.57–1.00)
GFR, EST AFRICAN AMERICAN: 23 mL/min/{1.73_m2} — AB (ref >59)
GFR, EST NON AFRICAN AMERICAN: 20 mL/min/{1.73_m2} — AB (ref >59)
GLUCOSE: 86 mg/dL (ref 65–99)
Globulin, Total: 3.3 g/dL (ref 1.5–4.5)
Potassium: 4.2 mmol/L (ref 3.5–5.2)
Sodium: 140 mmol/L (ref 134–144)
TOTAL PROTEIN: 6.6 g/dL (ref 6.0–8.5)

## 2017-06-11 ENCOUNTER — Other Ambulatory Visit: Payer: Self-pay | Admitting: Radiology

## 2017-06-12 ENCOUNTER — Other Ambulatory Visit: Payer: Self-pay | Admitting: Radiology

## 2017-06-13 ENCOUNTER — Ambulatory Visit (HOSPITAL_COMMUNITY)
Admission: RE | Admit: 2017-06-13 | Discharge: 2017-06-13 | Disposition: A | Discharge status: Home / Self Care | Payer: Medicare Other | Source: Ambulatory Visit | Attending: Pediatrics | Admitting: Pediatrics

## 2017-06-13 ENCOUNTER — Encounter (HOSPITAL_COMMUNITY): Payer: Self-pay

## 2017-06-13 DIAGNOSIS — R160 Hepatomegaly, not elsewhere classified: Secondary | ICD-10-CM

## 2017-06-13 DIAGNOSIS — F17210 Nicotine dependence, cigarettes, uncomplicated: Secondary | ICD-10-CM | POA: Insufficient documentation

## 2017-06-13 DIAGNOSIS — N183 Chronic kidney disease, stage 3 (moderate): Secondary | ICD-10-CM | POA: Insufficient documentation

## 2017-06-13 DIAGNOSIS — M329 Systemic lupus erythematosus, unspecified: Secondary | ICD-10-CM | POA: Insufficient documentation

## 2017-06-13 DIAGNOSIS — E785 Hyperlipidemia, unspecified: Secondary | ICD-10-CM | POA: Diagnosis not present

## 2017-06-13 DIAGNOSIS — M3500 Sicca syndrome, unspecified: Secondary | ICD-10-CM | POA: Insufficient documentation

## 2017-06-13 DIAGNOSIS — Z7982 Long term (current) use of aspirin: Secondary | ICD-10-CM | POA: Insufficient documentation

## 2017-06-13 DIAGNOSIS — J449 Chronic obstructive pulmonary disease, unspecified: Secondary | ICD-10-CM | POA: Insufficient documentation

## 2017-06-13 DIAGNOSIS — F329 Major depressive disorder, single episode, unspecified: Secondary | ICD-10-CM | POA: Insufficient documentation

## 2017-06-13 DIAGNOSIS — Z882 Allergy status to sulfonamides status: Secondary | ICD-10-CM | POA: Insufficient documentation

## 2017-06-13 DIAGNOSIS — M069 Rheumatoid arthritis, unspecified: Secondary | ICD-10-CM | POA: Insufficient documentation

## 2017-06-13 DIAGNOSIS — C8339 Diffuse large B-cell lymphoma, extranodal and solid organ sites: Secondary | ICD-10-CM | POA: Insufficient documentation

## 2017-06-13 DIAGNOSIS — Z888 Allergy status to other drugs, medicaments and biological substances status: Secondary | ICD-10-CM | POA: Diagnosis not present

## 2017-06-13 DIAGNOSIS — I2510 Atherosclerotic heart disease of native coronary artery without angina pectoris: Secondary | ICD-10-CM | POA: Insufficient documentation

## 2017-06-13 DIAGNOSIS — I429 Cardiomyopathy, unspecified: Secondary | ICD-10-CM | POA: Diagnosis not present

## 2017-06-13 DIAGNOSIS — Z79899 Other long term (current) drug therapy: Secondary | ICD-10-CM | POA: Insufficient documentation

## 2017-06-13 LAB — CBC
HEMATOCRIT: 37 % (ref 36.0–46.0)
HEMOGLOBIN: 11.6 g/dL — AB (ref 12.0–15.0)
MCH: 29.9 pg (ref 26.0–34.0)
MCHC: 31.4 g/dL (ref 30.0–36.0)
MCV: 95.4 fL (ref 78.0–100.0)
Platelets: 234 10*3/uL (ref 150–400)
RBC: 3.88 MIL/uL (ref 3.87–5.11)
RDW: 18 % — ABNORMAL HIGH (ref 11.5–15.5)
WBC: 7.8 10*3/uL (ref 4.0–10.5)

## 2017-06-13 LAB — PROTIME-INR
INR: 1
Prothrombin Time: 13.1 seconds (ref 11.4–15.2)

## 2017-06-13 LAB — APTT: aPTT: 29 seconds (ref 24–36)

## 2017-06-13 MED ORDER — LIDOCAINE-EPINEPHRINE 1 %-1:100000 IJ SOLN
INTRAMUSCULAR | Status: AC
  Filled 2017-06-13: qty 1

## 2017-06-13 MED ORDER — MIDAZOLAM HCL 2 MG/2ML IJ SOLN
INTRAMUSCULAR | Status: AC | PRN
  Administered 2017-06-13: 0.5 mg via INTRAVENOUS
  Administered 2017-06-13: 1 mg via INTRAVENOUS

## 2017-06-13 MED ORDER — FENTANYL CITRATE (PF) 100 MCG/2ML IJ SOLN
INTRAMUSCULAR | Status: AC
  Filled 2017-06-13: qty 4

## 2017-06-13 MED ORDER — MIDAZOLAM HCL 2 MG/2ML IJ SOLN
INTRAMUSCULAR | Status: AC
  Filled 2017-06-13: qty 4

## 2017-06-13 MED ORDER — SODIUM CHLORIDE 0.9 % IV SOLN
INTRAVENOUS | Status: DC
  Administered 2017-06-13: 12:00:00 via INTRAVENOUS

## 2017-06-13 MED ORDER — GELATIN ABSORBABLE 12-7 MM EX MISC
CUTANEOUS | Status: AC
  Filled 2017-06-13: qty 1

## 2017-06-13 MED ORDER — OXYCODONE-ACETAMINOPHEN 5-325 MG PO TABS
1.0000 | ORAL_TABLET | Freq: Once | ORAL | Status: AC
  Administered 2017-06-13: 2 via ORAL

## 2017-06-13 MED ORDER — FENTANYL CITRATE (PF) 100 MCG/2ML IJ SOLN
INTRAMUSCULAR | Status: AC | PRN
  Administered 2017-06-13: 25 ug via INTRAVENOUS
  Administered 2017-06-13: 50 ug via INTRAVENOUS

## 2017-06-13 MED ORDER — OXYCODONE-ACETAMINOPHEN 5-325 MG PO TABS
ORAL_TABLET | ORAL | Status: AC
  Filled 2017-06-13: qty 2

## 2017-06-13 NOTE — Sedation Documentation (Signed)
Patient is resting comfortably. 

## 2017-06-13 NOTE — Discharge Instructions (Addendum)
Liver Biopsy, Care After °Refer to this sheet in the next few weeks. These instructions provide you with information on caring for yourself after your procedure. Your health care provider may also give you more specific instructions. Your treatment has been planned according to current medical practices, but problems sometimes occur. Call your health care provider if you have any problems or questions after your procedure. °What can I expect after the procedure? °After your procedure, it is typical to have the following: °· A small amount of discomfort in the area where the biopsy was done and in the right shoulder or shoulder blade. °· A small amount of bruising around the area where the biopsy was done and on the skin over the liver. °· Sleepiness and fatigue for the rest of the day. ° °Follow these instructions at home: °· Rest at home for 1-2 days or as directed by your health care provider. °· Have a friend or family member stay with you for at least 24 hours. °· Because of the medicines used during the procedure, you should not do the following things in the first 24 hours: °? Drive. °? Use machinery. °? Be responsible for the care of other people. °? Sign legal documents. °? Take a bath or shower. °· There are many different ways to close and cover an incision, including stitches, skin glue, and adhesive strips. Follow your health care provider's instructions on: °? Incision care. °? Bandage (dressing) changes and removal. °? Incision closure removal. °· Do not drink alcohol in the first week. °· Do not lift more than 5 pounds or play contact sports for 2 weeks after this test. °· Take medicines only as directed by your health care provider. Do not take medicine containing aspirin or non-steroidal anti-inflammatory medicines such as ibuprofen for 1 week after this test. °· It is your responsibility to get your test results. °Contact a health care provider if: °· You have increased bleeding from an incision  that results in more than a small spot of blood. °· You have redness, swelling, or increasing pain in any incisions. °· You notice a discharge or a bad smell coming from any of your incisions. °· You have a fever or chills. °Get help right away if: °· You develop swelling, bloating, or pain in your abdomen. °· You become dizzy or faint. °· You develop a rash. °· You are nauseous or vomit. °· You have difficulty breathing, feel short of breath, or feel faint. °· You develop chest pain. °· You have problems with your speech or vision. °· You have trouble balancing or moving your arms or legs. °This information is not intended to replace advice given to you by your health care provider. Make sure you discuss any questions you have with your health care provider. °Document Released: 11/16/2004 Document Revised: 10/05/2015 Document Reviewed: 06/25/2013 °Elsevier Interactive Patient Education © 2018 [employer] °Moderate Conscious Sedation, Adult, Care After °These instructions provide you with information about caring for yourself after your procedure. Your health care provider may also give you more specific instructions. Your treatment has been planned according to current medical practices, but problems sometimes occur. Call your health care provider if you have any problems or questions after your procedure. °What can I expect after the procedure? °After your procedure, it is common: °· To feel sleepy for several hours. °· To feel clumsy and have poor balance for several hours. °· To have poor judgment for several hours. °· To vomit if you eat   too soon. ° °Follow these instructions at home: °For at least 24 hours after the procedure: ° °· Do not: °? Participate in activities where you could fall or become injured. °? Drive. °? Use heavy machinery. °? Drink alcohol. °? Take sleeping pills or medicines that cause drowsiness. °? Make important decisions or sign legal documents. °? Take care of children on your  own. °· Rest. °Eating and drinking °· Follow the diet recommended by your health care provider. °· If you vomit: °? Drink water, juice, or soup when you can drink without vomiting. °? Make sure you have little or no nausea before eating solid foods. °General instructions °· Have a responsible adult stay with you until you are awake and alert. °· Take over-the-counter and prescription medicines only as told by your health care provider. °· If you smoke, do not smoke without supervision. °· Keep all follow-up visits as told by your health care provider. This is important. °Contact a health care provider if: °· You keep feeling nauseous or you keep vomiting. °· You feel light-headed. °· You develop a rash. °· You have a fever. °Get help right away if: °· You have trouble breathing. °This information is not intended to replace advice given to you by your health care provider. Make sure you discuss any questions you have with your health care provider. °Document Released: 02/17/2013 Document Revised: 10/02/2015 Document Reviewed: 08/19/2015 °Elsevier Interactive Patient Education © 2018 [employer] ° °

## 2017-06-13 NOTE — Procedures (Signed)
Pre Procedure Dx: Liver lesion Post Procedural Dx: Same  Technically successful US guided biopsy of indeterminate liver lesion  EBL: None  No immediate complications.   Jay Kerilyn Cortner, MD Pager #: [phone]    

## 2017-06-13 NOTE — Sedation Documentation (Signed)
Patient is resting comfortably. States pain RUQ 5/10 dull

## 2017-06-13 NOTE — H&P (Signed)
Chief Complaint: Patient was seen in consultation today for liver lesion biopsy at the request of Vincent,Carol L  Referring Physician(s): Vincent,Carol L  Supervising Physician: Sandi Mariscal  Patient Status: [facility] - Out-pt  History of Present Illness: Tiffany [name] is a 77 y.o. female   Pt was scheduled for liver lesion biopsy 06/03/17 in IR Noted was low hg  And trending down Biopsy was not performed per Dr Barbie Banner Returned to [facility] for further evaluation Did receive blood transfusion that pm Hg since transfusion has come up: 11- 11.3- 12.9 (1/28)  Pt had presented to [facility] ED 5/62/13 after Heimlich maneuver 0/86 -- with rib pain; abd pain Work up revealed liver lesions and pancreas lesion  MRI 06/02/17 IMPRESSION: 1. Poorly marginated 4.6 x 4.3 cm upper left retroperitoneal soft tissue mass centered in the pancreatic tail with involvement/invasion of the splenic hilum, upper pole left kidney and left adrenal gland, new since 10/29/2015 CT. 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 2. Numerous (greater than 10) mildly T2 hyperintense liver masses scattered throughout the liver, suspect metastases. 3. Nonspecific mild left para-aortic adenopathy. 4. Medial right middle lobe nodular pulmonary opacity, indeterminate for neoplastic versus inflammatory etiology. Patchy left lower lobe opacity, unchanged, suspect inflammatory etiology. 5. Small dependent bilateral pleural effusions. 6. Small hiatal hernia.  Rescheduled now for liver lesion biopsy Hg 12.9 -- 1/28 Pending today   Past Medical History:  Diagnosis Date  . Anemia   . CAD (coronary artery disease)    DES to mid RCA 2006  . Cardiomyopathy ([place])   . Chronic lower back pain   . CKD (chronic kidney disease) stage 3, GFR 30-59 ml/min (HCC)   . COPD (chronic obstructive pulmonary disease) ([place]) Dx'd 09/28/2015  . Depression   . Dysphagia   .  History of blood transfusion 09/2015  . Hyperlipidemia   . Kidney stones   . Lymphoma ([place])   . Orthostatic hypotension   . PMB (postmenopausal bleeding) 06/12/2015  . Pneumonia 09/28/2015  . Rheumatoid arthritis ([place])   . Sjogren's syndrome ([place])   . Syncope   . Systemic lupus erythematosus ([place])     Past Surgical History:  Procedure Laterality Date  . BREAST BIOPSY Left   . CARDIAC CATHETERIZATION N/A 10/06/2015   Procedure: Right/Left Heart Cath and Coronary Angiography;  Surgeon: Leonie Man, MD;  Location: [facility];  Service: Cardiovascular;  Laterality: N/A;  . CATARACT EXTRACTION W/ INTRAOCULAR LENS  IMPLANT, BILATERAL Bilateral   . CORONARY ANGIOPLASTY WITH STENT PLACEMENT  2006   DES to mid RCA   . DILATION AND CURETTAGE OF UTERUS    . EYE SURGERY    . LAPAROSCOPIC CHOLECYSTECTOMY    . LITHOTRIPSY  X 3  . LYMPH NODE DISSECTION Left ~ 2000   "neck"  . MASS EXCISION Left ~ 2000   Lymphoma  . RETINAL DETACHMENT SURGERY Right   . TONSILLECTOMY Left ~ 2000  . TUBAL LIGATION      Allergies: Florinef [fludrocortisone acetate]; Iohexol; Sulfonamide derivatives; and Tape  Medications: Prior to Admission medications   Medication Sig Start Date End Date Taking? Authorizing Provider  albuterol (PROVENTIL) (2.5 MG/3ML) 0.083% nebulizer solution Take 3 mLs (2.5 mg total) by nebulization every 6 (six) hours as needed for wheezing or shortness of breath. 09/18/15  Yes Robbie Lis, MD  aspirin 81 MG tablet Take 81 mg by mouth daily.   Yes [provider]  atorvastatin (  LIPITOR) 40 MG tablet Take 1 tablet (40 mg total) by mouth daily. 03/04/17  Yes Eustaquio Maize, MD  cycloSPORINE (RESTASIS) 0.05 % ophthalmic emulsion Place 1 drop into both eyes 2 (two) times daily.   Yes [provider]  FERREX 150 150 MG capsule Take 1 capsule (150 mg total) by mouth every Monday, Wednesday, and Friday. 06/09/17  Yes Eustaquio Maize, MD  folic acid (FOLVITE) 1 MG  tablet TAKE 1 TABLET (1 MG TOTAL) BY MOUTH EVERY MORNING. 02/03/17  Yes Eustaquio Maize, MD  hydroxychloroquine (PLAQUENIL) 200 MG tablet TAKE 2 TABLET WITH FOOD OR MILK ONCE A DAY ORALLY 30 DAYS 04/08/17  Yes Eustaquio Maize, MD  ketoconazole (NIZORAL) 2 % cream Apply 1 application topically daily. Use on corner of mouth 01/30/17  Yes Eustaquio Maize, MD  mirtazapine (REMERON) 15 MG tablet Take 1 tablet (15 mg total) by mouth at bedtime. 03/04/17  Yes Eustaquio Maize, MD  Multiple Vitamins-Minerals (PRESERVISION/LUTEIN PO) Take 1 capsule by mouth daily.   Yes [provider]  omeprazole (PRILOSEC) 20 MG capsule TAKE 1 CAPSULE BY MOUTH TWICE A DAY 04/23/17  Yes Terald Sleeper, PA-C  oxyCODONE-acetaminophen (PERCOCET/ROXICET) 5-325 MG tablet Take 1-2 tablets by mouth every 6 (six) hours as needed for moderate pain or severe pain. 06/09/17  Yes Eustaquio Maize, MD  polyethylene glycol (MIRALAX / GLYCOLAX) packet Take 17 g by mouth daily. 06/09/17  Yes Eustaquio Maize, MD  sodium bicarbonate 325 MG tablet Take 2 tablets (650 mg total) by mouth 3 (three) times daily. 03/04/17  Yes Eustaquio Maize, MD  Travoprost, BAK Free, (TRAVATAN) 0.004 % SOLN ophthalmic solution Place 1 drop into both eyes at bedtime.   Yes [provider]  traZODone (DESYREL) 50 MG tablet Take 0.5-1 tablets (25-50 mg total) by mouth at bedtime as needed for sleep. 06/09/17  Yes Eustaquio Maize, MD  clobetasol cream (TEMOVATE) 6.37 % Apply 1 application topically 2 (two) times daily.    [provider]  midodrine (PROAMATINE) 10 MG tablet TAKE 2 TABLETS EVERY MORNING AND TAKE 1 TABLET EVERY EVENING 03/13/17   Eustaquio Maize, MD  nitroGLYCERIN (NITROSTAT) 0.4 MG SL tablet 1 TABLET UNDER TONGUE AT ONSET OF CHEST PAIN YOU MAY REPEAT EVERY 5 MINUTES FOR UP TO 3 DOSES. 04/23/17   [provider]  senna-docusate (SENOKOT-S) 8.6-50 MG tablet Take 1 tablet by mouth 2 (two) times daily. For constipation.  11/03/15   Rexene Alberts, MD     Family History  Problem Relation Age of Onset  . Coronary artery disease Father   . Coronary artery disease Mother   . Liver disease Mother   . Esophageal cancer Brother   . Breast cancer Sister     Social History   Socioeconomic History  . Marital status: Divorced    Spouse name: None  . Number of children: None  . Years of education: None  . Highest education level: None  Social Needs  . Financial resource strain: None  . Food insecurity - worry: None  . Food insecurity - inability: None  . Transportation needs - medical: None  . Transportation needs - non-medical: None  Occupational History  . Occupation: Disabled and retired  Tobacco Use  . Smoking status: Current Every Day Smoker    Packs/day: 0.25    Years: 23.00    Pack years: 5.75    Types: Cigarettes    Start date: 07/14/1986  Last attempt to quit: 02/25/2017    Years since quitting: 0.2  . Smokeless tobacco: Never Used  Substance and Sexual Activity  . Alcohol use: No    Alcohol/week: 0.0 oz  . Drug use: No  . Sexual activity: No    Birth control/protection: Post-menopausal  Other Topics Concern  . None  Social History Narrative  . None    Review of Systems: A 12 point ROS discussed and pertinent positives are indicated in the HPI above.  All other systems are negative.  Review of Systems  Constitutional: Positive for activity change, appetite change, fatigue and unexpected weight change.  Respiratory: Negative for cough.   Cardiovascular: Negative for chest pain.  Gastrointestinal: Positive for abdominal pain.  Musculoskeletal: Positive for back pain and gait problem.  Neurological: Positive for weakness.  Psychiatric/Behavioral: Negative for behavioral problems and confusion.    Vital Signs: BP (!) 110/51   Pulse 79   Temp 98.3 F (36.8 C) (Oral)   Ht 5\' 3"  (1.6 m)   Wt 126 lb (57.2 kg)   SpO2 95%   BMI 22.32 kg/m   Physical Exam  Constitutional:  She is oriented to person, place, and time.  Cardiovascular: Normal rate and regular rhythm.  Pulmonary/Chest: Effort normal and breath sounds normal.  Abdominal: Soft. Bowel sounds are normal. There is tenderness.  Musculoskeletal: Normal range of motion.  Neurological: She is alert and oriented to person, place, and time.  Skin: Skin is warm and dry.  Psychiatric: She has a normal mood and affect. Her behavior is normal. Judgment and thought content normal.  Nursing note and vitals reviewed.   Imaging: Ct Abdomen Pelvis Wo Contrast  Result Date: 05/30/2017 CLINICAL DATA:  Chest pain pain to the right rib and abdominal area EXAM: CT CHEST, ABDOMEN AND PELVIS WITHOUT CONTRAST TECHNIQUE: Multidetector CT imaging of the chest, abdomen and pelvis was performed following the standard protocol without IV contrast. COMPARISON:  CT abdomen pelvis 10/29/2015, 09/08/2015, CT chest 09/06/2011; chest radiograph 05/30/2017 FINDINGS: CT CHEST FINDINGS Cardiovascular: Limited evaluation without intravenous contrast. Nonaneurysmal aorta. Moderate aortic atherosclerotic calcifications. Coronary artery calcification. Normal heart size. No pericardial effusion Mediastinum/Nodes: Midline trachea. No thyroid mass. 12 mm pretracheal lymph node, similar compared to 2013 CT chest. Esophagus shows a small distal hiatal hernia Lungs/Pleura: Bronchiectasis in the left upper lobe with parenchymal scarring. Scattered scarring at the right apex. Mild bronchiectasis in the right middle lobe and bilateral lower lobes with mild mucous plugging in the left lower lobe dilated bronchi scattered ground-glass densities in the left lower lobe and right middle lobe. Negative for pneumothorax or pleural effusion Musculoskeletal: Degenerative changes of the spine. No acute or suspicious bone lesion CT ABDOMEN PELVIS FINDINGS Hepatobiliary: Interim development of multiple hypodense liver masses. Surgical clips at the gallbladder fossa. No  biliary dilatation Pancreas: Atrophic.  No inflammation Spleen: Normal in size without focal abnormality. Adrenals/Urinary Tract: Adrenal glands are within normal limits. Multiple stones are present within both kidneys, the largest on the right is seen in the upper pole and measures 7 mm, the largest on the left is seen in the lower pole and measures 6 mm. Cyst in the upper pole of the left kidney. Scarring in the bilateral kidneys. No hydronephrosis. Lobulated contour of the mid right kidney. Bladder unremarkable Stomach/Bowel: The stomach is nonenlarged. No dilated small bowel. No colon wall thickening. Vascular/Lymphatic: Extensive aortic atherosclerosis. No aneurysmal dilatation. Lymph nodes adjacent to the GE junction unchanged. Multiple upper abdominal and periaortic lymph nodes,  similar compared to the prior CT. 2.1 by 0.9 cm left periaortic lymph node, series 8, image number 24 is unchanged. Reproductive: Uterus is unremarkable.  No adnexal masses Other: Negative for free air or ascites. Ill-defined masslike process within the left upper quadrant of the abdomen between the spleen and upper pole of the left kidney. This is not seen on the prior study. Musculoskeletal: Degenerative changes of the spine. No suspicious bone lesions. 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 IMPRESSION: 1. Negative for pneumothorax or pleural effusion. No definite acute displaced rib fracture is seen. 2. Interim development of multiple ill-defined hypodense liver masses, concerning for metastatic disease. 3. Ill-defined masslike process in the left upper quadrant, between the upper pole of left kidney and spleen, uncertain if this is related to the tail of the pancreas or intraperitoneal inflammatory process. Metastatic disease is also considered given the new findings of multiple liver masses. Contrast-enhanced examination would be helpful to further evaluate. 4. Similar appearance of mediastinal, upper abdominal and retroperitoneal  mild adenopathy. 5. Multifocal areas of bronchiectasis and parenchymal scarring in the lungs. Mild mucous plugging in the left lower lobe with mild ground-glass density in the right middle and left lower lobes, findings could be secondary to chronic infection such as MAI. 6. Multiple stones within the bilateral kidneys.  No hydronephrosis. Electronically Signed   By: Donavan Foil M.D.   On: 05/30/2017 18:38   Dg Chest 2 View  Result Date: 05/30/2017 CLINICAL DATA:  Bilateral lower rib pain. EXAM: CHEST  2 VIEW COMPARISON:  11/22/2015 FINDINGS: Cardiomediastinal silhouette is normal. Mediastinal contours appear intact. Calcific atherosclerotic disease of the aorta. There is no evidence of focal airspace consolidation, pleural effusion or pneumothorax. Chronic hyperinflation of the lungs with upper lobe predominant emphysematous changes and mild chronic interstitial lung changes. Bilateral apical pleural/subpleural linear and nodular thickening. Osseous structures are without acute abnormality. No displaced rib fractures are seen. Soft tissues are grossly normal. IMPRESSION: No displaced rib fractures are seen. Chronic changes of COPD. Calcific atherosclerotic disease of the aorta. Electronically Signed   By: Fidela Salisbury M.D.   On: 05/30/2017 15:33   Ct Chest Wo Contrast  Result Date: 05/30/2017 CLINICAL DATA:  Chest pain pain to the right rib and abdominal area EXAM: CT CHEST, ABDOMEN AND PELVIS WITHOUT CONTRAST TECHNIQUE: Multidetector CT imaging of the chest, abdomen and pelvis was performed following the standard protocol without IV contrast. COMPARISON:  CT abdomen pelvis 10/29/2015, 09/08/2015, CT chest 09/06/2011; chest radiograph 05/30/2017 FINDINGS: CT CHEST FINDINGS Cardiovascular: Limited evaluation without intravenous contrast. Nonaneurysmal aorta. Moderate aortic atherosclerotic calcifications. Coronary artery calcification. Normal heart size. No pericardial effusion Mediastinum/Nodes:  Midline trachea. No thyroid mass. 12 mm pretracheal lymph node, similar compared to 2013 CT chest. Esophagus shows a small distal hiatal hernia Lungs/Pleura: Bronchiectasis in the left upper lobe with parenchymal scarring. Scattered scarring at the right apex. Mild bronchiectasis in the right middle lobe and bilateral lower lobes with mild mucous plugging in the left lower lobe dilated bronchi scattered ground-glass densities in the left lower lobe and right middle lobe. Negative for pneumothorax or pleural effusion Musculoskeletal: Degenerative changes of the spine. No acute or suspicious bone lesion CT ABDOMEN PELVIS FINDINGS Hepatobiliary: Interim development of multiple hypodense liver masses. Surgical clips at the gallbladder fossa. No biliary dilatation Pancreas: Atrophic.  No inflammation Spleen: Normal in size without focal abnormality. Adrenals/Urinary Tract: Adrenal glands are within normal limits. Multiple stones are present within both kidneys, the largest on the right is  seen in the upper pole and measures 7 mm, the largest on the left is seen in the lower pole and measures 6 mm. Cyst in the upper pole of the left kidney. Scarring in the bilateral kidneys. No hydronephrosis. Lobulated contour of the mid right kidney. Bladder unremarkable Stomach/Bowel: The stomach is nonenlarged. No dilated small bowel. No colon wall thickening. Vascular/Lymphatic: Extensive aortic atherosclerosis. No aneurysmal dilatation. Lymph nodes adjacent to the GE junction unchanged. Multiple upper abdominal and periaortic lymph nodes, similar compared to the prior CT. 2.1 by 0.9 cm left periaortic lymph node, series 8, image number 24 is unchanged. Reproductive: Uterus is unremarkable.  No adnexal masses Other: Negative for free air or ascites. Ill-defined masslike process within the left upper quadrant of the abdomen between the spleen and upper pole of the left kidney. This is not seen on the prior study. Musculoskeletal:  Degenerative changes of the spine. No suspicious bone lesions. 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 IMPRESSION: 1. Negative for pneumothorax or pleural effusion. No definite acute displaced rib fracture is seen. 2. Interim development of multiple ill-defined hypodense liver masses, concerning for metastatic disease. 3. Ill-defined masslike process in the left upper quadrant, between the upper pole of left kidney and spleen, uncertain if this is related to the tail of the pancreas or intraperitoneal inflammatory process. Metastatic disease is also considered given the new findings of multiple liver masses. Contrast-enhanced examination would be helpful to further evaluate. 4. Similar appearance of mediastinal, upper abdominal and retroperitoneal mild adenopathy. 5. Multifocal areas of bronchiectasis and parenchymal scarring in the lungs. Mild mucous plugging in the left lower lobe with mild ground-glass density in the right middle and left lower lobes, findings could be secondary to chronic infection such as MAI. 6. Multiple stones within the bilateral kidneys.  No hydronephrosis. Electronically Signed   By: Donavan Foil M.D.   On: 05/30/2017 18:38   Mr Abdomen Wo Contrast  Result Date: 06/02/2017 CLINICAL DATA:  Multiple new liver masses on recent unenhanced CT abdomen/pelvis. EXAM: MRI ABDOMEN WITHOUT CONTRAST TECHNIQUE: Multiplanar multisequence MR imaging was performed without the administration of intravenous contrast. COMPARISON:  05/30/2017 unenhanced CT abdomen/pelvis. FINDINGS: Lower chest: Small dependent bilateral pleural effusions. There is a 2.6 x 2.5 cm nodular opacity in the medial right middle lobe (series 4/image 7). Patchy left lower lobe opacity is not convincingly changed from recent chest CT. Hepatobiliary: No hepatic steatosis. There are numerous (greater than 10) similar-appearing masses of various sizes scattered throughout the liver, each demonstrating mild T2 hyperintensity and T1  hypointensity with mildly restricted diffusion. Representative 2.9 x 2.0 cm peripheral right liver lobe mass (series 4/image 16) and 2.7 x 2.5 cm anterior left liver lobe mass (series 4/image 23). These masses all appear new since 10/29/2015 CT. Cholecystectomy. Bile ducts are within normal post cholecystectomy limits. Common bile duct diameter 5 mm. No evidence of choledocholithiasis. Pancreas: There is a poorly marginated 4.6 x 4.3 cm upper left retroperitoneal soft tissue mass centered in the pancreatic tail (series 14/image 17), new since 10/29/2015 CT, which demonstrates restricted diffusion and mild T2 hyperintensity and T1 hypointensity compared to the spleen, which may invade upper pole of the left kidney and splenic hilum. No pancreatic duct dilation. No additional pancreatic mass. No evidence of pancreas divisum. Spleen: Normal size. Adrenals/Urinary Tract: Normal right adrenal. The left retroperitoneal mass appears to involve the left adrenal gland. No hydronephrosis. Simple appearing 1.6 cm upper right renal cyst. Stomach/Bowel: Small hiatal hernia. Otherwise normal nondistended stomach.  Visualized small and large bowel is normal caliber, with no bowel wall thickening. Vascular/Lymphatic: Normal caliber abdominal aorta. Mild left para-aortic adenopathy measuring up to 1.0 cm (series 7/image 54), not definitely changed since 10/29/2015 CT. Other: No abdominal ascites or focal fluid collection. Musculoskeletal: No aggressive appearing focal osseous lesions. IMPRESSION: 1. Poorly marginated 4.6 x 4.3 cm upper left retroperitoneal soft tissue mass centered in the pancreatic tail with involvement/invasion of the splenic hilum, upper pole left kidney and left adrenal gland, new since 10/29/2015 CT. 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 2. Numerous (greater than 10) mildly T2 hyperintense liver masses scattered throughout the liver,  suspect metastases. 3. Nonspecific mild left para-aortic adenopathy. 4. Medial right middle lobe nodular pulmonary opacity, indeterminate for neoplastic versus inflammatory etiology. Patchy left lower lobe opacity, unchanged, suspect inflammatory etiology. 5. Small dependent bilateral pleural effusions. 6. Small hiatal hernia. Electronically Signed   By: Ilona Sorrel M.D.   On: 06/02/2017 09:01   US Renal  Result Date: 06/02/2017 CLINICAL DATA:  Acute on chronic renal failure.  Nephrolithiasis. EXAM: RENAL / URINARY TRACT ULTRASOUND COMPLETE COMPARISON:  Abdominal MRI dated 06/02/2017 and CT scan of the abdomen and pelvis dated 05/30/2016 FINDINGS: Right Kidney: Length: 7.4 cm. Echogenicity within normal limits. Several small echogenic areas in the right kidney consistent with stones. No solid mass or hydronephrosis visualized. 1.4 cm cyst on the upper pole. Left Kidney: Length: 9.0 cm. Echogenicity within normal limits. No mass or hydronephrosis visualized. The ill-defined soft tissue density adjacent to the upper pole of the left kidney visible on CT scan of 05/30/2017 is not apparent on this exam. Bladder: Appears normal for degree of bladder distention. Multiple hypoechoic lesions in the liver consistent with metastatic disease are noted. IMPRESSION: 1. Multiple right renal stones. Small cyst in the upper pole the right kidney. 2. Normal appearing left kidney. The small stones visible on CT scan are not apparent on the ultrasound exam. 3. The ill-defined soft tissue mass adjacent to the upper pole of the left kidney on CT scan is not appreciable on this ultrasound. 4. Multiple masses in the liver consistent with metastatic disease. Electronically Signed   By: Lorriane Shire M.D.   On: 06/02/2017 08:45    Labs:  CBC: Recent Labs    06/03/17 1110 06/04/17 0746 06/04/17 0913 06/09/17 1537  WBC 5.0 4.8 5.3 7.6  HGB 7.6* 11.0* 11.3* 12.9  HCT 24.3* 35.4* 36.5 39.0  PLT 145* 160 168 240     COAGS: Recent Labs    06/02/17 1235  INR 1.11    BMP: Recent Labs    06/02/17 0448 06/03/17 0537 06/04/17 0746 06/09/17 1537  NA 135 134* 138 140  K 5.1 4.5 4.5 4.2  CL 107 107 110 104  CO2 20* 17* 18* 17*  GLUCOSE 77 76 75 86  BUN 30* 28* 26* 22  CALCIUM 9.9 9.1 9.6 10.1  CREATININE 2.33* 2.31* 2.13* 2.33*  GFRNONAA 19* 20* 22* 20*  GFRAA 22* 23* 25* 23*    LIVER FUNCTION TESTS: Recent Labs    06/02/17 0448 06/03/17 0537 06/04/17 0746 06/09/17 1537  BILITOT 0.7 0.7 0.4 0.3  AST 85* 61* 70* 40  ALT 60* 52 61* 36*  ALKPHOS 144* 124 129* 173*  PROT 6.5 5.5* 5.7* 6.6  ALBUMIN 2.7* 2.2* 2.3* 3.3*    TUMOR MARKERS: No results for input(s): AFPTM, CEA, CA199, CHROMGRNA in the last 8760 hours.  Assessment and Plan:  Worsening abd pain; rib pain Liver lesion; pancreatic lesion Scheduled for liver lesion biopsy Checking Hg today (had been dropping last week---Transfused 2U 1/23) Risks and benefits discussed with the patient including, but not limited to bleeding, infection, damage to adjacent structures or low yield requiring additional tests. All of the patient's questions were answered, patient is agreeable to proceed. Consent signed and in chart.  Thank you for this interesting consult.  I greatly enjoyed meeting Tiffany [name] and look forward to participating in their care.  A copy of this report was sent to the requesting provider on this date.  Electronically Signed: Lavonia Drafts, PA-C 06/13/2017, 11:50 AM   I spent a total of  30 Minutes   in face to face in clinical consultation, greater than 50% of which was counseling/coordinating care for liver lesion biopsy

## 2017-06-18 ENCOUNTER — Other Ambulatory Visit (HOSPITAL_COMMUNITY): Payer: Self-pay | Admitting: Adult Health

## 2017-06-18 ENCOUNTER — Ambulatory Visit (INDEPENDENT_AMBULATORY_CARE_PROVIDER_SITE_OTHER): Payer: Medicare Other | Admitting: Pediatrics

## 2017-06-18 ENCOUNTER — Encounter: Payer: Self-pay | Admitting: Pediatrics

## 2017-06-18 ENCOUNTER — Other Ambulatory Visit: Payer: Self-pay | Admitting: Pediatrics

## 2017-06-18 VITALS — BP 126/77 | HR 69 | Temp 96.9°F | Ht 63.0 in | Wt 125.2 lb

## 2017-06-18 DIAGNOSIS — R52 Pain, unspecified: Secondary | ICD-10-CM | POA: Diagnosis not present

## 2017-06-18 DIAGNOSIS — C8338 Diffuse large B-cell lymphoma, lymph nodes of multiple sites: Secondary | ICD-10-CM

## 2017-06-18 DIAGNOSIS — C8330 Diffuse large B-cell lymphoma, unspecified site: Secondary | ICD-10-CM

## 2017-06-18 DIAGNOSIS — R6884 Jaw pain: Secondary | ICD-10-CM

## 2017-06-18 NOTE — Progress Notes (Signed)
  Subjective:   Patient ID: Tiffany [name], female    DOB: Jun 04, 1940, 77 y.o.   MRN: [id number] CC: Follow-up (1 week, Biopsy)  HPI: Tiffany [name] is a 77 y.o. female presenting for Follow-up (1 week, Biopsy) here today with her daughter  Pt has had some mild pain and discomfort L side of her jaw for the past day.   Has had some confusion per daughter while taking pain medicine. She says pain medicine does keep pain at reasonable level. Taking 1.5 tabs with 1/2 tab usually 30 min later.  Has someone with her at home all the time, usually family member.  No fevers. Here today to discuss biopsy results. Liver biopsy of one of multiple lesions suspicious for metastases showed diffuse large Bcell lymphoma.  Hears have been bothering her some.  Relevant past medical, surgical, family and social history reviewed. Allergies and medications reviewed and updated. Social History   Tobacco Use  Smoking Status Current Every Day Smoker  . Packs/day: 0.25  . Years: 23.00  . Pack years: 5.75  . Types: Cigarettes  . Start date: 07/14/1986  . Last attempt to quit: 02/25/2017  . Years since quitting: 0.3  Smokeless Tobacco Never Used   ROS: Per HPI   Objective:    BP 126/77   Pulse 69   Temp (!) 96.9 F (36.1 C) (Oral)   Ht 5\' 3"  (1.6 m)   Wt 125 lb 3.2 oz (56.8 kg)   BMI 22.18 kg/m   Wt Readings from Last 3 Encounters:  06/18/17 125 lb 3.2 oz (56.8 kg)  06/13/17 126 lb (57.2 kg)  06/09/17 131 lb 12.8 oz (59.8 kg)    Gen: NAD, alert, thin, cooperative with exam, NCAT EYES: EOMI, no conjunctival injection, or no icterus ENT:  TMs pearly gray b/l, OP without erythema. Slight asymmetry L jawline compared with R. Mildly ttp. No redness. No fluctuance, redness, discharge within oral cavity L side. LYMPH: no cervical LAD CV: NRRR, normal S1/S2, no murmur, distal pulses 2+ b/l Resp: CTABL, no wheezes, normal WOB Abd: +BS, soft, NTND.  Ext: No edema, warm Neuro: Alert and oriented MSK:  normal muscle bulk  Assessment & Plan:  Zanovia was seen today for follow-up biopsy results.  Diagnoses and all orders for this visit:  Diffuse large B-cell lymphoma of lymph nodes of multiple regions [facility]) Discussed biopsy results with daughter and pt. She has PET scan scheduled for 12/14 and appt with oncology scheduled 2/15 as next steps for treatment.  Pain  Decrease to 1 tab oxycodone q6h prn given confusion at home per daughter. Will let us know if not adequately controlling pain. Has Rx from last appt.  Jaw pain Took out her bottom dentures to show me where, dentures were in upside down.  Follow up plan: Return in about 4 weeks (around 07/16/2017), or if symptoms worsen or fail to improve. Assunta Found, MD [place]

## 2017-06-18 NOTE — Patient Instructions (Signed)
One tab of pain medicine every 6 hours as needed for pain If confused or not normal self do not give the pain.

## 2017-06-19 ENCOUNTER — Ambulatory Visit: Payer: Medicare Other | Admitting: Pediatrics

## 2017-06-22 ENCOUNTER — Other Ambulatory Visit: Payer: Self-pay | Admitting: Pediatrics

## 2017-06-26 ENCOUNTER — Ambulatory Visit (HOSPITAL_COMMUNITY)
Admission: RE | Admit: 2017-06-26 | Discharge: 2017-06-26 | Disposition: A | Discharge status: Home / Self Care | Payer: Medicare Other | Source: Ambulatory Visit | Attending: Adult Health | Admitting: Adult Health

## 2017-06-26 DIAGNOSIS — R590 Localized enlarged lymph nodes: Secondary | ICD-10-CM | POA: Diagnosis not present

## 2017-06-26 DIAGNOSIS — K8689 Other specified diseases of pancreas: Secondary | ICD-10-CM | POA: Insufficient documentation

## 2017-06-26 DIAGNOSIS — C787 Secondary malignant neoplasm of liver and intrahepatic bile duct: Secondary | ICD-10-CM | POA: Insufficient documentation

## 2017-06-26 DIAGNOSIS — C7951 Secondary malignant neoplasm of bone: Secondary | ICD-10-CM | POA: Insufficient documentation

## 2017-06-26 DIAGNOSIS — I700 Atherosclerosis of aorta: Secondary | ICD-10-CM | POA: Diagnosis not present

## 2017-06-26 DIAGNOSIS — C8330 Diffuse large B-cell lymphoma, unspecified site: Secondary | ICD-10-CM | POA: Insufficient documentation

## 2017-06-26 LAB — GLUCOSE, CAPILLARY: Glucose-Capillary: 76 mg/dL (ref 65–99)

## 2017-06-26 MED ORDER — FLUDEOXYGLUCOSE F - 18 (FDG) INJECTION
6.6000 | Freq: Once | INTRAVENOUS | Status: AC | PRN
  Administered 2017-06-26: 6.6 via INTRAVENOUS

## 2017-06-27 ENCOUNTER — Other Ambulatory Visit: Payer: Self-pay

## 2017-06-27 ENCOUNTER — Encounter (HOSPITAL_COMMUNITY): Payer: Self-pay | Admitting: Internal Medicine

## 2017-06-27 ENCOUNTER — Inpatient Hospital Stay (HOSPITAL_COMMUNITY): Payer: Medicare Other | Attending: Internal Medicine | Admitting: Internal Medicine

## 2017-06-27 VITALS — BP 108/56 | HR 81 | Temp 97.9°F | Resp 18 | Ht 67.5 in | Wt 115.1 lb

## 2017-06-27 DIAGNOSIS — Z5112 Encounter for antineoplastic immunotherapy: Secondary | ICD-10-CM | POA: Insufficient documentation

## 2017-06-27 DIAGNOSIS — R160 Hepatomegaly, not elsewhere classified: Secondary | ICD-10-CM

## 2017-06-27 DIAGNOSIS — C8330 Diffuse large B-cell lymphoma, unspecified site: Secondary | ICD-10-CM | POA: Diagnosis not present

## 2017-06-27 DIAGNOSIS — C8299 Follicular lymphoma, unspecified, extranodal and solid organ sites: Secondary | ICD-10-CM

## 2017-06-27 DIAGNOSIS — Z5111 Encounter for antineoplastic chemotherapy: Secondary | ICD-10-CM | POA: Insufficient documentation

## 2017-06-27 DIAGNOSIS — G8929 Other chronic pain: Secondary | ICD-10-CM

## 2017-06-27 DIAGNOSIS — Z87891 Personal history of nicotine dependence: Secondary | ICD-10-CM

## 2017-06-27 DIAGNOSIS — M545 Low back pain: Secondary | ICD-10-CM

## 2017-06-27 DIAGNOSIS — N184 Chronic kidney disease, stage 4 (severe): Secondary | ICD-10-CM | POA: Diagnosis not present

## 2017-06-27 LAB — COMPREHENSIVE METABOLIC PANEL
ALT: 24 U/L (ref 14–54)
ANION GAP: 18 — AB (ref 5–15)
AST: 48 U/L — AB (ref 15–41)
Albumin: 3.4 g/dL — ABNORMAL LOW (ref 3.5–5.0)
Alkaline Phosphatase: 173 U/L — ABNORMAL HIGH (ref 38–126)
BILIRUBIN TOTAL: 1 mg/dL (ref 0.3–1.2)
BUN: 51 mg/dL — ABNORMAL HIGH (ref 6–20)
CHLORIDE: 101 mmol/L (ref 101–111)
CO2: 18 mmol/L — ABNORMAL LOW (ref 22–32)
Calcium: 13 mg/dL — ABNORMAL HIGH (ref 8.9–10.3)
Creatinine, Ser: 3.01 mg/dL — ABNORMAL HIGH (ref 0.44–1.00)
GFR calc Af Amer: 16 mL/min — ABNORMAL LOW (ref >60)
GFR, EST NON AFRICAN AMERICAN: 14 mL/min — AB (ref >60)
Glucose, Bld: 90 mg/dL (ref 65–99)
POTASSIUM: 3.7 mmol/L (ref 3.5–5.1)
Sodium: 137 mmol/L (ref 135–145)
Total Protein: 7.5 g/dL (ref 6.5–8.1)

## 2017-06-27 LAB — CBC WITH DIFFERENTIAL/PLATELET
BASOS ABS: 0 10*3/uL (ref 0.0–0.1)
Basophils Relative: 0 %
Eosinophils Absolute: 0 10*3/uL (ref 0.0–0.7)
Eosinophils Relative: 0 %
HEMATOCRIT: 37.6 % (ref 36.0–46.0)
HEMOGLOBIN: 11.8 g/dL — AB (ref 12.0–15.0)
LYMPHS PCT: 14 %
Lymphs Abs: 1 10*3/uL (ref 0.7–4.0)
MCH: 29.4 pg (ref 26.0–34.0)
MCHC: 31.4 g/dL (ref 30.0–36.0)
MCV: 93.5 fL (ref 78.0–100.0)
Monocytes Absolute: 1 10*3/uL (ref 0.1–1.0)
Monocytes Relative: 14 %
NEUTROS ABS: 5.2 10*3/uL (ref 1.7–7.7)
NEUTROS PCT: 72 %
PLATELETS: 112 10*3/uL — AB (ref 150–400)
RBC: 4.02 MIL/uL (ref 3.87–5.11)
RDW: 16.9 % — ABNORMAL HIGH (ref 11.5–15.5)
WBC: 7.3 10*3/uL (ref 4.0–10.5)

## 2017-06-27 LAB — PROTIME-INR
INR: 1.06
Prothrombin Time: 13.7 seconds (ref 11.4–15.2)

## 2017-06-27 LAB — LACTATE DEHYDROGENASE: LDH: 405 U/L — AB (ref 98–192)

## 2017-06-27 LAB — APTT: aPTT: 26 seconds (ref 24–36)

## 2017-06-27 LAB — URIC ACID: URIC ACID, SERUM: 11.5 mg/dL — AB (ref 2.3–6.6)

## 2017-06-27 MED ORDER — SODIUM CHLORIDE 0.9 % IV SOLN: INTRAVENOUS | Status: DC

## 2017-06-27 MED ORDER — ALLOPURINOL 100 MG PO TABS: 100.0000 mg | ORAL_TABLET | Freq: Every day | ORAL | 0 refills | Status: AC

## 2017-06-27 MED ORDER — ZOLEDRONIC ACID 4 MG/5ML IV CONC
4.0000 mg | Freq: Once | INTRAVENOUS | Status: DC
  Filled 2017-06-27: qty 5

## 2017-06-27 NOTE — Progress Notes (Signed)
Lab results: Calcium 13, uric acid 11.5, platelets 112,000, creatinine 3.01, potassium 3.7 (normal), ldh 405. Dr. Walden Field notified this evening. Patient's daughter aware of results and is trying to see if she can get transportation for her mom to come back to the CC for 500cc Normal Saline IV bolus and Zometa 4mg  IV. Patient will come in @ 0700 and arrive at the ER registration desk in which they will arrive patient in EPIC and print labels. Vonzell Schlatter to meet patient at ER registration desk and walk patient up to [place]. Lares Supervisor to be paged @ [phone] upon arrival of patient.   Orders per Dr. Walden Field: 500cc NS bolus over 1 hour to run before the Zometa. Zometa 4mg  to infuse over 30 minutes. Also try to obtain an ionized calcium level (gold top per Ed in Lab) prior to Zometa infusion.   [facility] verified the order for [place] @ 1937 tonight. Order is on [facility] for 06/28/17.  Pt to return on Monday for PICC line placement, BMP, and uric acid level.   Allopurinol 100mg  po daily #30 called into [facility]. Patient to start taking this as soon as it is filled. Patient's daughter notified of Allopurinol and the need to take it daily.   Dr. Walden Field notified that we were able to set this up for the patient. Dr. Carles Collet and Dr. Wynetta Emery text paged by me to let them know that we had a patient that would be in the [place] on Saturday for an infusion. Dr. Walden Field called Dr. Wynetta Emery and let him know that we would be giving her the Zometa and NS in the [place] tomorrow as well.

## 2017-06-27 NOTE — Patient Instructions (Addendum)
[place] at [facility] Discharge Instructions  RECOMMENDATIONS MADE BY THE CONSULTANT AND ANY TEST RESULTS WILL BE SENT TO YOUR REFERRING PHYSICIAN.  Today you saw Dr. Walden Field  Liver biopsy showed Diffuse Large B cell lymphoma - a more aggressive grade lymphoma  PET scan shows significant disease in multiple areas of the body. This is probably why she is not feeling well.   We usually stage with a bone marrow biopsy. We want to see if there is involvement in the bone marrow. This will be done in [facility] or [facility].  She would need to take intravenous chemotherapy. Adriamycin (this one can decrease heart function), the dosing with lymphoma is different than with breast, so it is generally tolerated better in lymphoma than with breast cancer, Cytoxan (dosing different than with breast), the biggest side effect is that the blood counts can go low, Vincristine - numbness/tingling fingertips/constipation, Prednisone (steroid taken by mouth), Rituxan - monoclonal antibody - side effect - infusion reaction while receiving medication. This chemotherapy drug regimen is called RCHOP for short. Treatment is once every 21 days x 6 cycles. We sometimes use growth factors (neulasta) to help keep your white blood cells up and/or red blood cell stimulators (aranesp/procrit) to help keep your red blood cell count up. Another medication we would give once every 28 days is an injection in your belly fat to help strengthen your bones (XGEVA).   Heart function is 55%. This is something we would continue to watch throughout treatment.   You will need a port-a-cath/picc line to infuse the chemotherapy for safety purposes.   The nurse navigator Anderson Malta will be in touch with you to set up chemotherapy teaching for RCHOP and XGEVA.   Usually in people with lymphoma - when they begin to get treated - they actually begin to feel better.   We  will get blood work today. Your creatinine is around 2.3 as of the blood work from 06/09/17.   Appointment @ [facility] on Monday @ 1030 for a PICC line insertion.   We will work on your bone marrow biopsy scheduling for Monday.       Implanted [facility] Guide An implanted port is a type of central line that is placed under the skin. Central lines are used to provide IV access when treatment or nutrition needs to be given through a person's veins. Implanted ports are used for long-term IV access. An implanted port may be placed because:  You need IV medicine that would be irritating to the small veins in your hands or arms.  You need long-term IV medicines, such as antibiotics.  You need IV nutrition for a long period.  You need frequent blood draws for lab tests.  You need dialysis.  Implanted ports are usually placed in the chest area, but they can also be placed in the upper arm, the abdomen, or the leg. An implanted port has two main parts:  Reservoir. The reservoir is round and will appear as a small, raised area under your skin. The reservoir is the part where a needle is inserted to give medicines or draw blood.  Catheter. The catheter is a thin, flexible tube that extends from the reservoir. The catheter is placed into a large vein. Medicine that is inserted into the reservoir goes into the catheter and then into the vein.  How will I care for my incision site? Do not get the  incision site wet. Bathe or shower as directed by your health care provider. How is my port accessed? Special steps must be taken to access the port:  Before the port is accessed, a numbing cream can be placed on the skin. This helps numb the skin over the port site.  Your health care provider uses a sterile technique to access the port. ? Your health care provider must put on a mask and sterile gloves. ? The skin over your port is cleaned carefully with an antiseptic and allowed to  dry. ? The port is gently pinched between sterile gloves, and a needle is inserted into the port.  Only "non-coring" port needles should be used to access the port. Once the port is accessed, a blood return should be checked. This helps ensure that the port is in the vein and is not clogged.  If your port needs to remain accessed for a constant infusion, a clear (transparent) bandage will be placed over the needle site. The bandage and needle will need to be changed every week, or as directed by your health care provider.  Keep the bandage covering the needle clean and dry. Do not get it wet. Follow your health care provider's instructions on how to take a shower or bath while the port is accessed.  If your port does not need to stay accessed, no bandage is needed over the port.  What is flushing? Flushing helps keep the port from getting clogged. Follow your health care provider's instructions on how and when to flush the port. Ports are usually flushed with saline solution or a medicine called heparin. The need for flushing will depend on how the port is used.  If the port is used for intermittent medicines or blood draws, the port will need to be flushed: ? After medicines have been given. ? After blood has been drawn. ? As part of routine maintenance.  If a constant infusion is running, the port may not need to be flushed.  How long will my port stay implanted? The port can stay in for as long as your health care provider thinks it is needed. When it is time for the port to come out, surgery will be done to remove it. The procedure is similar to the one performed when the port was put in. When should I seek immediate medical care? When you have an implanted port, you should seek immediate medical care if:  You notice a bad smell coming from the incision site.  You have swelling, redness, or drainage at the incision site.  You have more swelling or pain at the port site or the  surrounding area.  You have a fever that is not controlled with medicine.     PICC Insertion A peripherally inserted central catheter (PICC) is a long, thin, flexible tube that is inserted into a vein in the upper arm. It is a form of IV access. It is considered to be a "central" line because the tip of the PICC ends in a large vein in your chest. This large vein is called the superior vena cava (SVC). The PICC tip ends in the SVC because there is a lot of blood flow in the SVC. This allows medicines and IV fluids to be quickly distributed throughout the body. The PICC is inserted using a sterile technique by a specially trained health care provider. After the PICC is inserted, a chest X-ray may be done to ensure that it is in the correct  place. Tell a health care provider about:  Any allergies you have.  All medicines you are taking, including vitamins, herbs, eye drops, creams, and over-the-counter medicines.  Any problems you or family members have had with anesthetic medicines.  Any blood disorders you have.  Any surgeries you have had.  Any medical conditions you have. What are the risks? Generally, this is a safe procedure. However, as with any procedure, complications can occur. Possible complications include:  Infection at the insertion site or in the blood.  Bleeding at the insertion site or internally.  Injury or collapse of the lung.  Movement or malposition of the PICC.  Inflammation of the vein (phlebitis).  Nerve injury or irritation.  Clot formation at the tip of the PICC line.  Blood clot in the lung (pulmonary embolus).  Injury to the large blood vessels or heart (rare).  What happens before the procedure?  Your health care provider may want you to have blood tests. These tests can help tell how well your blood clots.  If you take blood thinners (anticoagulant medicine), ask your health care provider if you should stop taking them. What happens during  the procedure?  You will have to lie flat for about 30-45 minutes for this procedure.  Your health care provider will start by identifying a vein into which the PICC line will be placed. This is done using ultrasound or X-ray guidance.  Medicine is used to numb the skin around the insertion site.  The skin where the catheter will be inserted is cleaned and covered with a sterile surgical drape.  A small needle is inserted into the vein, and then a small guidewire is advanced into the superior vena cava.  The catheter is then advanced over the guidewire and moved into position. The guidewire is then removed.  The catheter is flushed and blood is drawn back to confirm it is in the vein.  If this is done without X-ray guidance, an X-ray will be needed to make sure the catheter tip is in the correct position.  Once the placement is confirmed, the PICC is secured to the skin with a device and covered with a sterile dressing. What happens after the procedure?  You should avoid any strenuous activity for the next 2 days or as directed by your health care provider.  You will be allowed to go back to your regular activities after the procedure.  You will be instructed on the care of your PICC line. This information is not intended to replace advice given to you by your health care provider. Make sure you discuss any questions you have with your health care provider. Document Released: 02/17/2013 Document Revised: 10/05/2015 Document Reviewed: 02/19/2013 Elsevier Interactive Patient Education  2017 [place] you for choosing [place] at [facility] to provide your oncology and hematology care.  To afford each patient quality time with our provider, please arrive at least 15 minutes before your scheduled appointment time.    If you have a lab appointment with the [facility] please come in thru the  Main Entrance and check in at the main  information desk  You need to re-schedule your appointment should you arrive 10 or more minutes late.  We strive to give you quality time with our providers, and arriving late affects you and other patients whose appointments are after yours.  Also, if you no show three or more times for appointments you  may be dismissed from the clinic at the providers discretion.     Again, thank you for choosing [facility].  Our hope is that these requests will decrease the amount of time that you wait before being seen by our physicians.       _____________________________________________________________  Should you have questions after your visit to [facility], please contact our office at [phone] between the hours of 8:30 a.m. and 4:30 p.m.  Voicemails left after 4:30 p.m. will not be returned until the following business day.  For prescription refill requests, have your pharmacy contact our office.

## 2017-06-28 ENCOUNTER — Encounter (HOSPITAL_COMMUNITY): Payer: Self-pay

## 2017-06-28 ENCOUNTER — Inpatient Hospital Stay (HOSPITAL_COMMUNITY): Payer: Medicare Other

## 2017-06-28 DIAGNOSIS — Z5112 Encounter for antineoplastic immunotherapy: Secondary | ICD-10-CM | POA: Diagnosis not present

## 2017-06-28 DIAGNOSIS — C8330 Diffuse large B-cell lymphoma, unspecified site: Secondary | ICD-10-CM

## 2017-06-28 LAB — HEPATITIS B SURFACE ANTIBODY,QUALITATIVE: Hep B S Ab: NONREACTIVE

## 2017-06-28 LAB — HIV ANTIBODY (ROUTINE TESTING W REFLEX): HIV Screen 4th Generation wRfx: NONREACTIVE

## 2017-06-28 LAB — BETA 2 MICROGLOBULIN, SERUM: BETA 2 MICROGLOBULIN: 19.9 mg/L — AB (ref 0.6–2.4)

## 2017-06-28 LAB — HEPATITIS B SURFACE ANTIGEN: Hepatitis B Surface Ag: NEGATIVE

## 2017-06-28 LAB — HEPATITIS B CORE ANTIBODY, TOTAL: Hep B Core Total Ab: NEGATIVE

## 2017-06-28 MED ORDER — ZOLEDRONIC ACID 4 MG/5ML IV CONC
4.0000 mg | Freq: Once | INTRAVENOUS | Status: DC
  Filled 2017-06-28 (×3): qty 5

## 2017-06-28 MED ORDER — ZOLEDRONIC ACID 4 MG/5ML IV CONC
4.0000 mg | Freq: Once | INTRAVENOUS | Status: AC
  Administered 2017-06-28: 4 mg via INTRAVENOUS
  Filled 2017-06-28: qty 5

## 2017-06-28 MED ORDER — SODIUM CHLORIDE 0.9% FLUSH
10.0000 mL | Freq: Once | INTRAVENOUS | Status: AC
  Administered 2017-06-28: 10 mL via INTRAVENOUS

## 2017-06-28 MED ORDER — SODIUM CHLORIDE 0.9 % IV SOLN
INTRAVENOUS | Status: DC
  Administered 2017-06-28: 07:00:00 via INTRAVENOUS

## 2017-06-28 MED ORDER — ZOLEDRONIC ACID 4 MG/5ML IV CONC: 4.0000 mg | Freq: Once | INTRAVENOUS | Status: DC

## 2017-06-28 MED ORDER — SODIUM CHLORIDE 0.9 % IV SOLN
INTRAVENOUS | Status: DC
  Administered 2017-06-28: 500 mL via INTRAVENOUS

## 2017-06-28 NOTE — Patient Instructions (Signed)
[place] at [facility]  Discharge Instructions:  You received hydration today and zometa.  _______________________________________________________________  Thank you for choosing [place] at [facility] to provide your oncology and hematology care.  To afford each patient quality time with our providers, please arrive at least 15 minutes before your scheduled appointment.  You need to re-schedule your appointment if you arrive 10 or more minutes late.  We strive to give you quality time with our providers, and arriving late affects you and other patients whose appointments are after yours.  Also, if you no show three or more times for appointments you may be dismissed from the clinic.  Again, thank you for choosing [place]  at [place] hope is that these requests will allow you access to exceptional care and in a timely manner. _______________________________________________________________  If you have questions after your visit, please contact our office at [phone] between the hours of 8:30 a.m. and 5:00 p.m. Voicemails left after 4:30 p.m. will not be returned until the following business day. _______________________________________________________________  For prescription refill requests, have your pharmacy contact our office. _______________________________________________________________  Recommendations made by the consultant and any test results will be sent to your referring physician. _______________________________________________________________

## 2017-06-28 NOTE — Progress Notes (Signed)
Patient to treatment room for hydration and zometa.  Patient stated small headache but not new.  Uses OTC medications for relief.  Alert and oriented x 3 with family at side.   No other complaints voiced.  Review Oncology assessment.  Patient denied jaw or tooth pain and leg pain.   Patient tolerated hydration and zometa with no complaints voiced.  Peripheral IV site clean and dry with no bruising or swelling noted at site.  No complaints of pain at site.  Band aid and cotton ball applied.  VSS with discharge and left via wheelchair with family.  No s/s of distress noted.

## 2017-06-29 LAB — CALCIUM, IONIZED: Calcium, Ionized, Serum: 7.2 mg/dL — ABNORMAL HIGH (ref 4.5–5.6)

## 2017-06-30 ENCOUNTER — Ambulatory Visit (HOSPITAL_COMMUNITY): Payer: Medicare Other

## 2017-06-30 ENCOUNTER — Other Ambulatory Visit (HOSPITAL_COMMUNITY): Payer: Self-pay | Admitting: Emergency Medicine

## 2017-06-30 ENCOUNTER — Inpatient Hospital Stay (HOSPITAL_COMMUNITY): Payer: Medicare Other

## 2017-06-30 DIAGNOSIS — C8330 Diffuse large B-cell lymphoma, unspecified site: Secondary | ICD-10-CM

## 2017-06-30 DIAGNOSIS — Z5112 Encounter for antineoplastic immunotherapy: Secondary | ICD-10-CM | POA: Diagnosis not present

## 2017-06-30 LAB — BASIC METABOLIC PANEL
Anion gap: 14 (ref 5–15)
BUN: 46 mg/dL — AB (ref 6–20)
CHLORIDE: 100 mmol/L — AB (ref 101–111)
CO2: 21 mmol/L — AB (ref 22–32)
CREATININE: 2.89 mg/dL — AB (ref 0.44–1.00)
Calcium: 11.3 mg/dL — ABNORMAL HIGH (ref 8.9–10.3)
GFR calc Af Amer: 17 mL/min — ABNORMAL LOW (ref >60)
GFR calc non Af Amer: 15 mL/min — ABNORMAL LOW (ref >60)
GLUCOSE: 84 mg/dL (ref 65–99)
Potassium: 3.3 mmol/L — ABNORMAL LOW (ref 3.5–5.1)
SODIUM: 135 mmol/L (ref 135–145)

## 2017-06-30 LAB — HEPATITIS C VRS RNA DETECT BY PCR-QUAL: HEPATITIS C VRS RNA BY PCR-QUAL: NEGATIVE

## 2017-06-30 LAB — URIC ACID: Uric Acid, Serum: 10 mg/dL — ABNORMAL HIGH (ref 2.3–6.6)

## 2017-06-30 MED ORDER — SODIUM CHLORIDE 0.9% FLUSH: 3.0000 mL | INTRAVENOUS | Status: DC | PRN

## 2017-06-30 MED ORDER — HEPARIN SOD (PORK) LOCK FLUSH 100 UNIT/ML IV SOLN
300.0000 [IU] | Freq: Once | INTRAVENOUS | Status: AC
  Administered 2017-06-30: 300 [IU] via INTRAVENOUS

## 2017-06-30 MED ORDER — SODIUM CHLORIDE 0.9% FLUSH
10.0000 mL | INTRAVENOUS | Status: DC | PRN
  Administered 2017-06-30: 10 mL
  Filled 2017-06-30: qty 10

## 2017-06-30 MED ORDER — SODIUM CHLORIDE 0.9 % IV SOLN
INTRAVENOUS | Status: DC
  Administered 2017-06-30: 13:00:00 via INTRAVENOUS

## 2017-06-30 NOTE — Patient Instructions (Signed)
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  We are going to treat you with a combination of drugs called RCHOP.  These include cytoxan, adriamycin, vincristine, neulasta and rituxan.  You will receive this chemotherapy every 21 days.  21 days is 1 cycle.  You will have 6 cycles.  You will see the doctor regularly throughout treatment.  We monitor your lab work prior to every treatment.  The doctor monitors your response to treatment by the way you are feeling, your blood work, and scans periodically. There will be wait times while you are here for treatment.  It will take about 30 minutes to 1 hour for lab work to result.  Then there will be wait time while pharmacy mixes your medications.    You will receive premedications prior to chemotherapy.  These include: Premeds: Tylenol and Benadryl:  Given to help prevent any type of reaction to chemotherapy.  Aloxi - high powered nausea/vomiting prevention medication used for chemotherapy patients. Dexamethasone - steroid - given to reduce the risk of you having an allergic type reaction to the chemotherapy. Dex can cause you to feel energized, nervous/anxious/jittery, make you have trouble sleeping, and/or make you feel hot/flushed in the face/neck and/or look pink/red in the face/neck. These side effects will pass as the Dex wears off. (takes 20 minutes to infuse)   You will get neulasta after every treatment of chemo: Neulasta - this medication is not chemo but being given because you have had chemo. It is usually given 24-27 hours after the completion of chemotherapy. This medication works by boosting your bone marrow's supply of white blood cells. White blood cells are what protect our bodies against infection. The medication is given in the form of a subcutaneous injection. It is given in the fatty tissue of your abdomen. It is a short needle. The major side  effect of this medication is bone or muscle pain. The drug of choice to relieve or lessen the pain is Aleve or Ibuprofen. If a physician has ever told you not to take Aleve or Ibuprofen - then don't take it. You should then take Tylenol/acetaminophen. Take either medication as the bottle directs you to.  The level of pain you experience as a result of this injection can range from none, to mild or moderate, or severe. Please let us know if you develop moderate or severe bone pain.   You can take Claritin 10 mg over the counter for a few days after receiving neulasta to help with the bone aches and pains.    **DO NOT expose the Neulasta On-body injector to diagnostic imaging (CT scans, MRI, Ultrasound, X-ray), radiation treatment, or oxygen rich environments, such as hyperbaric chambers. **    POTENTIAL SIDE EFFECTS OF TREATMENT:  Cyclophosphamide (Generic Name) Other Names: Cytoxan, Neosar  About This Drug Cyclophosphamide is a drug used to treat cancer. It is given in the vein (IV) or by mouth. This drug takes 30 minutes to infuse.  Possible Side Effects (More Common) . Nausea and throwing up (vomiting). These symptoms may happen within a few hours after your treatment and may last up to 72 hours. Medicines are available to stop or lessen these side effects. . Bone marrow depression. This is a decrease in the number of white blood cells, red blood cells, and platelets. This may raise your risk of infection, make you tired and weak (fatigue), and raise your risk  of bleeding. . Hair loss: You may notice hair getting thin. Some patients lose their hair. Hair loss is often complete scalp hair loss and can involve loss of eyebrows, eyelashes, and pubic hair. You may notice this a few days or weeks after treatment has started. Most often hair loss is temporary; your hair should grow back when treatment is done. . Decreased appetite (decreased hunger) . Blurred vision . Soreness of the mouth and  throat. You may have red areas, white patches, or sores that hurt. . Effects on the bladder. This drug may cause irritation and bleeding in the bladder. You may have blood in your urine. To help stop this, you will get extra fluids to help you pass more urine. You may get a drug called mesna, which helps to decrease irritation and bleeding. You may also get a medicine to help you pass more urine. You may have a catheter (tube) placed in your bladder so that your bladder will be washed with this drug.  Possible Side Effects (Less Common) . Darkening of the skin or nails . Metallic taste in the mouth . Changes in lung tissue may happen with large amounts of this drug. These changes may not last forever, and your lung tissue may go back to normal. Sometimes these changes may not be seen for many years. You may get a cough or have trouble catching your breath.  Allergic Reactions Serious allergic reactions including anaphylaxis are rare. While you are getting this drug in your vein (IV), tell your nurse right away if you have any of these symptoms of an allergic reaction: . Trouble catching your breath . Feeling like your tongue or throat are swelling . Feeling your heart beat quickly or in a not normal way (palpitations) . Feeling dizzy or lightheaded . Flushing, itching, rash, and/or hives  Treating Side Effects . Drink 6-8 cups of fluids each day unless your doctor has told you to limit your fluid intake due to some other health problem. A cup is 8 ounces of fluid. If you throw up or have loose bowel movements you should drink more fluids so that you do not become dehydrated (lack water in the body due to losing too much fluid). . Ask your doctor or nurse about medicine that is available to help stop or lessen nausea or throwing up. . Mouth care is very important. Your mouth care should consist of routine, gentle cleaning of your teeth or dentures and rinsing your mouth with a mixture of 1/2  teaspoon of salt in 8 ounces of water or  teaspoon of baking soda in 8 ounces of water. This should be done at least after each meal and at bedtime. . If you have mouth sores, avoid mouthwash that has alcohol. Also avoid alcohol and smoking because they can bother your mouth and throat. . Talk with your nurse about getting a wig before you lose your hair. Also, call the [facility] at 800-ACS-2345 to find out information about the " Look Good.Marland KitchenMarland KitchenFeel Better" program close to where you live. It is a free program where women undergoing chemotherapy learn about wigs, turbans and scarves as well as makeup techniques and skin and nail care.  Important Information . Whenever you tell a doctor or nurse your health history, always tell them that you have received cyclophosphamide in the past. . If you take this drug by mouth swallow the medicine whole. Do not chew, break or crush it. Marland Kitchen You can take the medicine  with or without food. If you have nausea, take it with food. Do not take the pills at bedtime.  Food and Drug Interactions There are no known interactions of cyclophosphamide with food. This drug may interact with other medicines. Tell your doctor and pharmacist about all the medicines and dietary supplements (vitamins, minerals, herbs and others) that you are taking at this time. The safety and use of dietary supplements and alternative diets are often not known. Using these might affect your cancer or interfere with your treatment. Until more is known, you should not use dietary supplements or alternative diets without your cancer doctor's help.  When to Call the Doctor Call your doctor or nurse right away if you have any of these symptoms: . Fever of 100.5 F (38 C) or higher . Chills . Bleeding or bruising that is not normal . Blurred vision or other changes in eyesight . Pain when passing urine; blood in urine . Pain in your lower back or side . Wheezing or trouble breathing .  Swelling of legs, ankles, or feet . Feeling dizzy or lightheaded . Feeling confused or agitated . Signs of liver problems: dark urine, pale bowel movements, bad stomach pain, feeling very tired and weak, unusual itching, or yellowing of the eyes or skin . Unusual thirst or passing urine often . Nausea that stops you from eating or drinking . Throwing up more than 3 times a day Call your doctor or nurse as soon as possible if any of these symptoms happen: . Pain in your mouth or throat that makes it hard to eat or drink . Nausea not relieved by prescribed medicines  Sexual Problems and Reproductive Concerns . Infertility warning: Sexual problems and reproduction concerns may happen. In both men and women, this drug may affect your ability to have children. This cannot be determined before your treatment. Talk with your doctor or nurse if you plan to have children. Ask for information on sperm or egg banking. . In men, this drug may interfere with your ability to make sperm, but it should not change your ability to have sexual relations. . In women, menstrual bleeding may become irregular or stop while you are getting this drug. Do not assume that you cannot become pregnant if you do not have a menstrual period. . Women may go through signs of menopause (change of life) like vaginal dryness or itching. Vaginal lubricants can be used to lessen vaginal dryness, itching, and pain during sexual relations. . Genetic counseling is available for you to talk about the effects of this drug therapy on future pregnancies. Also, a genetic counselor can look at the possible risk of problems in the unborn baby due to this medicine if an exposure happens during pregnancy. . Pregnancy warning: This drug may have harmful effects on the unborn child, so effective methods of birth control should be used during your cancer treatment. . Breast feeding warning: Women should not breast feed during treatment because this  drug could enter the breast milk and badly harm a breast feeding baby.   Doxorubicin (Generic Name) Other Names: Adriamycin, hydroxyl daunorubicin  About This Drug Doxorubicin is a drug used to treat cancer. This drug is given in the vein (IV).  This is an IV push that will take about 5-10 minutes.  Possible Side Effects (More Common) . Bone marrow depression. This is a decrease in the number of white blood cells, red blood cells, and platelets. This may raise your risk of infection, make  you tired and weak (fatigue), and raise your risk of bleeding. . Hair loss: Hair loss is often complete scalp hair loss and can involve loss of eyebrows, eyelashes, and pubic hair. You may notice this a few days or weeks after treatment has started. Most often hair loss is temporary; your hair should grow back when treatment is done. . Nausea and throwing up (vomiting). These symptoms may happen within a few hours after your treatment and may last up to 24 hours. Medicines are available to stop or lessen these side effects. . Soreness of the mouth and throat. You may have red areas, white patches, or sores that hurt. . Change in the color of your urine to pink or red. This color change will go away in one to two days. . Effects on the heart: This drug can weaken the heart and lower heart function. Your heart function will be checked as needed. You may have trouble catching your breath, mainly during activities. You may also have trouble breathing while lying down, and have swelling in your ankles. . Sensitivity to light (photosensitivity). Photosensitivity means that you may become more sensitive to the effects of the sun, sun lamps, and tanning beds. Your eyes may water more, mostly in bright light. . Metallic taste in the mouth: This may change the taste of food and drinks . Decreased appetite (decreased hunger) . Darkening of the skin or nails . Weakness that interferes with your daily  activities    Possible Side Effects (Less Common) . Skin and tissue irritation may involve redness, pain, warmth, or swelling at the IV site. This happens if the drug leaks out of the vein and into nearby tissue. . Changes in your liver function. Your doctor will check your liver function as needed. . This drug may cause an increased risk of developing a second cancer  Allergic Reaction Serious allergic reactions, including anaphylaxis are rare. While you are getting this drug in your vein (IV), tell your nurse right away if you have any of these symptoms of an allergic reaction: . Trouble catching your breath . Feeling like your tongue or throat are swelling . Feeling your heart beat quickly or in a not normal way (palpitations) . Feeling dizzy or lightheaded . Flushing, itching, rash, and/or hives  Treating Side Effects . Drink 6-8 cups of fluids every day unless your doctor has told you to limit your fluid intake due to some other health problem. A cup is 8 ounces of fluid. If you vomit or have diarrhea, you should drink more fluids so that you do not become dehydrated (lack water in the body due to losing too much fluid). . Ask your doctor or nurse about medicine that is available to help stop or lessen nausea, throwing up, and/or loose bowel movements . Wear dark sunglasses and use sunscreen with SPF 30 or higher when you are outdoors even for a short time. Cover up when you are out in the sun. Wear wide-brimmed hats, long-sleeved shirts, and pants. Keep your neck, chest, and back covered. . Mouth care is very important. Your mouth care should consist of routine, gentle cleaning of your teeth or dentures and rinsing your mouth with a mixture of 1/2 teaspoon of salt in 8 ounces of water or  teaspoon of baking soda in 8 ounces of water. This should be done at least after each meal and at bedtime. . If you have mouth sores, avoid mouthwash that has alcohol. Avoid alcohol and smoking because  they can bother your mouth and throat. . Talk with your nurse about getting a wig before you lose your hair. Also, call the [facility] at 800-ACS-2345 to find out information about the "Look Good, Feel Better" program close to where you live. It is a free program where women getting chemotherapy can learn about wigs, turbans and scarves as well as makeup techniques and skin and nail care. . While you are getting this drug, please tell your nurse right away if you have any pain, redness, or swelling at the site of the IV infusion.  Food and Drug Interactions There are no known interactions of doxorubicin with food. This drug may interact with other medicines. Tell your doctor and pharmacist about all the medicines and dietary supplements (vitamins, minerals, herbs and others) that you are taking at this time. The safety and use of dietary supplements and alternative diets are often not known. Using these might affect your cancer or interfere with your treatment. Until more is known, you should not use dietary supplements or alternative diets without your cancer doctor's help.  When to Call the Doctor Call your doctor or nurse right away if you have any of these symptoms: . Fever of 100.5 F (38 C) or above . Chills . Easy bruising or bleeding . Wheezing or trouble breathing . Rash or itching . Feeling dizzy or lightheaded . Feeling that your heart is beating in a fast or not normal way (palpitations) . Loose bowel movements (diarrhea) more than 4 times a day or diarrhea with weakness or feeling lightheaded . Nausea that stops you from eating or drinking . Throwing up more than 3 times a day . Signs of liver problems: dark urine, pale bowel movements, bad stomach pain, feeling very tired and weak, unusual itching, or yellowing of the eyes or skin, . During the IV infusion, if you have pain, redness, or swelling at the site of the IV infusion, please tell your nurse right away Call  your doctor or nurse as soon as possible if any of these symptoms happen: . Decreased urine . Pain in your mouth or throat that makes it hard to eat or drink . Nausea and throwing up that is not relieved by prescribed medicines . Rash that is not relieved by prescribed medicines . Swelling of legs, ankles, or feet . Weight gain of 5 pounds in one week (fluid retention) . Lasting loss of appetite or rapid weight loss of five pounds in a week . Fatigue that interferes with your daily activities . Extreme weakness that interferes with normal activities  Sexual Problems and Reproduction Concerns . Infertility warning: Sexual problems and reproduction concerns may happen. In both men and women, this drug may affect your ability to have children. This cannot be determined before your treatment. Talk with your doctor or nurse if you plan to have children. Ask for information on sperm or egg banking. . In men, this drug may interfere with your ability to make sperm, but it should not change your ability to have sexual relations. . In women, menstrual bleeding may become irregular or stop while you are getting this drug. Do not assume that you cannot become pregnant if you do not have a menstrual period. . Women may go through signs of menopause (change of life) like vaginal dryness or itching. Vaginal lubricants can be used to lessen vaginal dryness, itching, and pain during sexual relations. . Genetic counseling is available for you to talk about the effects  of this drug therapy on future pregnancies. Also, a genetic counselor can look at the possible risk of problems in the unborn baby due to this medicine if an exposure happens during pregnancy. . Pregnancy warning: This drug may have harmful effects on the unborn baby, so effective methods of birth control should be used by you and your partner during your cancer treatment and for at least 6 months after treatment . Breast feeding warning: Women  should not breast feed during treatment because this drug could enter the breast milk and badly harm a breast feeding baby.      Rituximab (Generic Name) Other Name: Rituxan  About This Drug Rituximab is a monoclonal antibody used to treat cancer. This drug is given in the vein (IV).  This first time this is given it will be infused slower to monitor for infusion reactions.    Possible Side Effects (More Common) . Bone marrow depression. This is a decrease in the number of white blood cells, red blood cells, and platelets. This may raise your risk of infection, make you tired and weak (fatigue), and raise your risk of bleeding. . Rash-skin irritation, redness or itching (dermatitis) . Flu-like symptoms: fever, headache, muscle and joint aches, and fatigue (low energy, feeling weak) . Infusion-related reactions . Hepatitis B - if you have ever had hepatitis B, the virus may come back during treatment with this drug. Your doctor will test to see if you have ever had hepatitis B prior to your treatment. . Changes in your central nervous system can happen. The central nervous system is made up of your brain and spinal cord. You could feel: extreme tiredness, agitation, confusion, or have: hallucinations (see or hear things that are not there), trouble understanding or speaking, loss of control of your bowels or bladder, eyesight changes, numbness or lack of strength to your arms, legs, face, or body, seizures or coma. If you start to have any of these symptoms let your doctor know right away. . Tumor lysis: This drug may act on the cancer cells very quickly. This may affect how your kidneys work. Your doctor will monitor your kidney function. . Changes in your liver function. Your doctor will check your liver function as needed. . Nausea and throwing up (vomiting): these symptoms may happen within a few hours after your treatment and may last up to 24 hours. Medicines are available to stop or  lessen these side effects. . Loose bowel movements (diarrhea) that may last for a few days . Abdominal pain . Infections . Cough, runny nose . Swelling of your legs, ankles and/or feet or hands . High blood pressure Your doctor will check your blood pressure as needed. . Abnormal heart beat  Possible Side Effects (Less Common) . Shortness of breath . Soreness of the mouth and throat. You may have red areas, white patches, or sores that hurt.  Infusion Reactions Infusion Reactions are the most common side effect linked to use of this drug and can be quite severe. Medicines will be given before you get the drug to lower the severity of this side effect. The infusion reactions are the worse with the first dose of the drug and become less severe with more doses of the drug. While you are getting this drug in your vein (IV), tell your nurse right away if you have any of these symptoms of an allergic reaction: . Trouble catching your breath . Feeling like your tongue or throat are swelling . Feeling your  heart beat quickly or in a not normal way (palpitations) . Feeling dizzy or lightheaded . Flushing, itching, rash, and/or hives  Treating Side Effects . Ask your doctor or nurse about medicine to stop or lessen headache, loose bowel movements (diarrhea), constipation, nausea, throwing up (vomiting), or pain. . If you get a rash do not put anything it unless your doctor or nurse says you may. Keep the area around the rash clean and dry. Ask your doctor for medicine if the rash bothers you. . Drink 6-8 cups of fluids each day unless your doctor has told you to limit your fluid intake due to some other health problem. A cup is 8 ounces of fluid. If you throw up or have loose bowel movements, you should drink more fluids so that you do not become dehydrated (lack of water in the body from losing too much fluid). . If you are not able to move your bowels, check with your doctor or nurse before you use  enemas, laxatives, or suppositories . If you have mouth sores, avoid mouthwash that has alcohol. Also avoid alcohol and smoking because they can bother your mouth and throat. . If you have a nose bleed, sit with your head tipped slightly forward. Apply pressure by lightly pinching the bridge of your nose between your thumb and forefinger. Call your doctor if you feel dizzy or faint or if the bleeding doesn't stop after 10 to 15 minutes  Important Information . After treatment with this drug, vaccination with live viruses should be delayed until the immune system recovers. . Symptoms of abnormal bleeding may be: coughing up blood, throwing up blood (may look like coffee grounds), red or black, tarry bowel movements, blood in urine, abnormally heavy menstrual flow, nosebleeds, or any unusual bleeding. . Symptoms of high blood pressure may be: headache, blurred vision, confusion, chest pain, or a feeling that your heart is beat differently. Marland Kitchen Urinary tract infection. Symptoms may include: . Pain or burning when you pass urine . Feeling like you have to pass urine often, but not much comes out when you do. . Tender or heavy feeling in your lower abdomen . Cloudy urine and/or urine that smells bad. . Pain on one side of your back under your ribs. This is where your kidneys are. . Fever, chills, nausea and/or throwing up  Food and Drug Interactions There are no known interactions of rituximab and any food. This drug may interact with other medicines. Tell your doctor and pharmacist about all the medicines and dietary supplements (vitamins, minerals, herbs and others) that you are taking at this time. The safety and use of dietary supplements and alternative diets are often not known. Using these might affect your cancer or interfere with your treatment. Until more is known, you should not use dietary supplements or alternative diets without your cancer doctor's help.  When to Call the Doctor Call your  doctor or nurse right away if you have any of these symptoms: . Fever of 100.5 F (38 C) higher . Chills . Trouble breathing . Rash with or without itching . Blistering or peeling of skin . Chest pain or symptoms of a heart attack. Most heart attacks involve pain in the center of the chest that lasts more than a few minutes. The pain may go away and come back or it can be constant. It can feel like pressure, squeezing, fullness, or pain. Sometimes pain is felt in one or both arms, the back, neck, jaw, or stomach.  If any of these symptoms last 2 minutes, call 911 . Easy bleeding or bruising . Blood in urine or bowel movements . Feeling that your heart is beating in a fast or not normal way (palpitations) . Nausea that stops you from eating or drinking . Throwing up (vomiting) more than 3 times in one day . Abdominal pain . Loose bowel movements (diarrhea) 4 times in one day or diarrhea with weakness or lightheadedness . No bowel movement in 3 days or if you feel uncomfortable . Feeling dizzy or lightheaded . Changes in your speech or vision . Feeling confused . Weakness of your arms and legs or poor coordination (feeling clumsy) . Signs of liver problems: dark urine, pale bowel movements, bad stomach pain, feeling very tired and weak, unusual itching, or yellowing of skin or eyes . Symptoms of a urinary tract infection (see important information) Call your doctor or nurse as soon as possible if you have any of these symptoms: . Swelling of your legs, ankles and/or feet . Fatigue and /or weakness that interferes with your daily activities . Joint and muscle pain or muscle spasms that are not relieved by prescribed medicines . Cough that lasts longer than normal    Reproduction Concerns . Pregnancy warning: This drug is known to cross the placenta. This drug may have harmful effects on an unborn baby. Effective methods of birth control should be used during treatment with this drug and for  12 months after the last treatment. If exposure occurs to an unborn baby, the baby's immune system may be affected, which could last for months after birth. Until the immune system recovers, live vaccines should not be administered to the baby. Be sure to talk with your doctor if you are pregnant or planning to become pregnant while getting this drug. . Breast feeding warning: It is not known if rituximab is passed into human breast milk. In animal studies, this drug was detected in in breast milk. For this reason, women should talk to their doctor about the risks and benefits of breast feeding during treatment with this drug because this drug may enter the breast milk and badly harm a breast feeding baby.   Vincristine sulfate (Generic Name) Other Names: Oncovin, Vincasar, VCR  About This Drug Vincristine is a drug used to treat cancer. It is given in the vein (IV).  This drug will take 10 minutes to infuse.  Possible Side Effects (More Common) . Constipation (not able to move bowels) . Hair loss. You may notice hair getting thin. Some patients lose their hair. Your hair often grows back when treatment is done. Most often hair loss is temporary; your hair should grow back when treatment is done. . Effects on the nerves are called peripheral neuropathy. You may feel numbness, tingling, or pain in your hands and feet. It may be hard for you to button your clothes, open jars, or walk as usual. The effect on the nerves may get worse with more doses of the drug. These effects get better in some people after the drug is stopped but it does not get better in all people.    Possible Side Effects (Less Common) . Soreness of the mouth and throat. You may have red areas, white patches, or sores that hurt. . Swelling of your legs, ankles, and/or feet . Skin and tissue irritation may involve redness, pain, warmth, or swelling at the IV site. This happens if the drug leaks out of the vein and into nearby  tissue. . Blood pressure changes. Some patients have low blood pressure and some patients have high blood pressure. . Jaw pain . Hoarseness . Blurred or double vision or other changes in eyesight may happen but are a rare side effect. . Feeling dizzy or lightheaded . Drooping eyelids are a rare side effect. . Depression . Rash . Bone marrow depression. This is a decrease in the number of white blood cells, red blood cells, and platelets. This may raise your risk of infection, make you tired and weak (fatigue), and raise your risk of bleeding. . Chest pain or symptoms of a heart attack. Most heart attacks involve pain in the center of the chest that lasts more than a few minutes. The pain may go away and come back or it can be constant. It can feel like pressure, squeezing, fullness, or pain. Sometimes pain is felt in one or both arms, the back, neck, jaw, or stomach. If any of these symptoms last 2 minutes, call 911. . Trouble sleeping . Lack of appetite; weight loss  Allergic Reactions Serious allergic reactions including anaphylaxis are rare. While you are getting this drug in your vein (IV), tell your nurse right away if you have any of these symptoms of an allergic reaction: . Trouble catching your breath . Feeling like your tongue or throat are swelling . Feeling your heart beat quickly or in a not normal way (palpitations) . Feeling dizzy or lightheaded . Flushing, itching, rash, and/or hives  Treating Side Effects . While you are getting this drug in your IV, please tell your nurse right away if you have any pain, redness, or swelling at the site of the IV infusion. . Ask your doctor or nurse how to prevent or lessen constipation. Constipation can become a serious side effect. If you are constipated, check with your doctor or nurse before you use enemas, laxatives, or suppositories. . Drink 6-8 cups of fluids each day unless your doctor has told you to limit your fluid intake due to  some other health problem. A cup is 8 ounces of fluid. If you throw up or have loose bowel movements, you should drink more fluids so that you do not become dehydrated (lack water in the body from losing too much fluid). . Mouth care is very important. Your mouth care should consist of gently cleaning your teeth or dentures and rinsing your mouth with a mixture of  teaspoon salt in 8 ounces of water or  teaspoon sodium bicarbonate (baking soda) in 8 ounces of water. This should be done at least after each meal and at bedtime. . If you have mouth sores, avoid mouthwash that has alcohol. Also avoid alcohol and smoking because they can bother your mouth and throat. . If you get a rash do not put anything on it unless your doctor or nurse says you may. Keep the area around the rash clean and dry. Ask your doctor for medicine if your rash bothers you. . If you have numbness and tingling in your hands and feet, be careful when cooking, walking, and handling sharp objects and hot liquids. . Talk with your nurse about getting a wig before you lose your hair. Also, call the [facility] at 800-ACS-2345 to find out information about the "Look Good, Feel Better" program close to where you live. It is a free program where women getting chemotherapy can learn about wigs, turbans and scarves as well as makeup techniques and skin and nail care.  Food  and Drug Interactions . There are known interactions of Vincristine with grapefruit. Do not eat grapefruit or drink grapefruit juice while taking this drug. . Check with your doctor or pharmacist about all other prescription medicines and dietary supplements you are taking before starting this medicine as there are a lot of known drug interactions with Vincristine. Also, check with your doctor or pharmacist before starting any new prescription, over-the-counter medicines, or dietary supplement to make sure that there are no interactions.  When to Call the  Doctor Call your doctor or nurse right away if you have any of these symptoms: . Redness, pain, warmth, or swelling at the IV site while you are getting this drug . No bowel movement in 3 days or when you feel uncomfortable. . Abdominal pain . Fever of 100.5 F (38 C) or higher . Chills . Feeling short of breath . Easy bleeding or bruising . Jaw pain . Hoarseness . Drooping eyelids . Blurred vision or other changes in eyesight . Feeling confused, restless, or irritable . Seizures (Common symptoms of a seizure can include confusion, blacking out, passing out, loss of hearing or vision, blurred vision, unusual smells or tastes (such as burning rubber), trouble talking, tremors or shaking in parts or all of the body, repeated body movements, tense muscles that do not relax, and loss of control of urine and bowels. There are other less common symptoms of seizures. If you or your family member suspects you are having a seizure, call 911 right away). . Hallucinations (seeing, hearing, or feeling things that are not really there) . Feeling . Rash . Chest pain or symptoms of a heart attack. Most heart attacks involve pain in the center of the chest that lasts more than a few minutes. The pain may go away and come back. It can feel like pressure, squeezing, fullness, or pain. Sometimes pain is felt in one or both arms, the back, neck, jaw, or stomach. If any of these symptoms last 2 minutes, call 911. Call your doctor or nurse as soon as possible if you have any of these symptoms: . Pain in fingers, toes, joints, or testicles . Numbness, tingling, or decreased feeling or weakness in fingers, toes, arms, or legs . Trouble walking or changes in the way you walk . Swelling of your legs, ankles, and/or feet . Headache . Pain in your mouth or throat that makes it hard to eat or drink . Loss of appetite or weight loss . Hearing changes . Depression . Trouble sleeping  Reproduction Concerns . Women  may go through signs of menopause (change of life) like vaginal dryness or itching. Vaginal lubricants can be used to lessen vaginal dryness, itching, and pain during sexual relations. . Pregnancy warning: This drug may have harmful effects on the unborn child, so effective methods of birth control should be used during your cancer treatment. . Breast feeding warning: It is not known if this drug passes into breast milk. For this reason, women should talk to their doctor about the risks and benefits of breast feeding during treatment with this drug because this drug may enter the breast milk and badly harm a breast feeding baby.    SELF CARE ACTIVITIES WHILE ON CHEMOTHERAPY:  Hydration Increase your fluid intake 48 hours prior to treatment and drink at least 8 to 12 cups (64 ounces) of water/decaff beverages per day after treatment. You can still have your cup of coffee or soda but these beverages do not count as part  of your 8 to 12 cups that you need to drink daily. No alcohol intake.  Medications Continue taking your normal prescription medication as prescribed.  If you start any new herbal or new supplements please let us know first to make sure it is safe. Mouth Care Have teeth cleaned professionally before starting treatment. Keep dentures and partial plates clean. Use soft toothbrush and do not use mouthwashes that contain alcohol. Biotene is a good mouthwash that is available at most pharmacies or may be ordered by calling [phone]. Use warm salt water gargles (1 teaspoon salt per 1 quart warm water) before and after meals and at bedtime. Or you may rinse with 2 tablespoons of three-percent hydrogen peroxide mixed in eight ounces of water. If you are still having problems with your mouth or sores in your mouth please call the clinic. If you need dental work, please let the doctor know before you go for your appointment so that we can coordinate the best possible time for you in regards  to your chemo regimen. You need to also let your dentist know that you are actively taking chemo. We may need to do labs prior to your dental appointment.  Skin Care Always use sunscreen that has not expired and with SPF (Sun Protection Factor) of 50 or higher. Wear hats to protect your head from the sun. Remember to use sunscreen on your hands, ears, face, & feet.  Use good moisturizing lotions such as udder cream, eucerin, or even Vaseline. Some chemotherapies can cause dry skin, color changes in your skin and nails.    . Avoid long, hot showers or baths. . Use gentle, fragrance-free soaps and laundry detergent. . Use moisturizers, preferably creams or ointments rather than lotions because the thicker consistency is better at preventing skin dehydration. Apply the cream or ointment within 15 minutes of showering. Reapply moisturizer at night, and moisturize your hands every time after you wash them.  Hair Loss (if your doctor says your hair will fall out)  . If your doctor says that your hair is likely to fall out, decide before you begin chemo whether you want to wear a wig. You may want to shop before treatment to match your hair color. . Hats, turbans, and scarves can also camouflage hair loss, although some people prefer to leave their heads uncovered. If you go bare-headed outdoors, be sure to use sunscreen on your scalp. . Cut your hair short. It eases the inconvenience of shedding lots of hair, but it also can reduce the emotional impact of watching your hair fall out. . Don't perm or color your hair during chemotherapy. Those chemical treatments are already damaging to hair and can enhance hair loss. Once your chemo treatments are done and your hair has grown back, it's OK to resume dyeing or perming hair. With chemotherapy, hair loss is almost always temporary. But when it grows back, it may be a different color or texture. In older adults who still had hair color before chemotherapy, the  new growth may be completely gray.  Often, new hair is very fine and soft.  Infection Prevention Please wash your hands for at least 30 seconds using warm soapy water. Handwashing is the #1 way to prevent the spread of germs. Stay away from sick people or people who are getting over a cold. If you develop respiratory systems such as green/yellow mucus production or productive cough or persistent cough let us know and we will see if you need an  antibiotic. It is a good idea to keep a pair of gloves on when going into grocery stores/[employer] to decrease your risk of coming into contact with germs on the carts, etc. Carry alcohol hand gel with you at all times and use it frequently if out in public. If your temperature reaches 100.5 or higher please call the clinic and let us know.  If it is after hours or on the weekend please go to the ER if your temperature is over 100.5.  Please have your own personal thermometer at home to use.    Sex and bodily fluids If you are going to have sex, a condom must be used to protect the person that isn't taking chemotherapy. Chemo can decrease your libido (sex drive). For a few days after chemotherapy, chemotherapy can be excreted through your bodily fluids.  When using the toilet please close the lid and flush the toilet twice.  Do this for a few day after you have had chemotherapy.     Effects of chemotherapy on your sex life Some changes are simple and won't last long. They won't affect your sex life permanently. Sometimes you may feel: . too tired . not strong enough to be very active . sick or sore  . not in the mood . anxious or low Your anxiety might not seem related to sex. For example, you may be worried about the cancer and how your treatment is going. Or you may be worried about money, or about how you family are coping with your illness. These things can cause stress, which can affect your interest in sex. It's important to talk to your partner about  how you feel. Remember - the changes to your sex life don't usually last long. There's usually no medical reason to stop having sex during chemo. The drugs won't have any long term physical effects on your performance or enjoyment of sex. Cancer can't be passed on to your partner during sex  Contraception It's important to use reliable contraception during treatment. Avoid getting pregnant while you or your partner are having chemotherapy. This is because the drugs may harm the baby. Sometimes chemotherapy drugs can leave a man or woman infertile.  This means you would not be able to have children in the future. You might want to talk to someone about permanent infertility. It can be very difficult to learn that you may no longer be able to have children. Some people find counselling helpful. There might be ways to preserve your fertility, although this is easier for men than for women. You may want to speak to a fertility expert. You can talk about sperm banking or harvesting your eggs. You can also ask about other fertility options, such as donor eggs. If you have or have had breast cancer, your doctor might advise you not to take the contraceptive pill. This is because the hormones in it might affect the cancer.  It is not known for sure whether or not chemotherapy drugs can be passed on through semen or secretions from the vagina. Because of this some doctors advise people to use a barrier method if you have sex during treatment. This applies to vaginal, anal or oral sex. Generally, doctors advise a barrier method only for the time you are actually having the treatment and for about a week after your treatment. Advice like this can be worrying, but this does not mean that you have to avoid being intimate with your partner. You can still have  close contact with your partner and continue to enjoy sex.  Animals If you have cats or birds we just ask that you not change the litter or change the cage.   Please have someone else do this for you while you are on chemotherapy.   Food Safety During and After Cancer Treatment Food safety is important for people both during and after cancer treatment. Cancer and cancer treatments, such as chemotherapy, radiation therapy, and stem cell/bone marrow transplantation, often weaken the immune system. This makes it harder for your body to protect itself from foodborne illness, also called food poisoning. Foodborne illness is caused by eating food that contains harmful bacteria, parasites, or viruses.  Foods to avoid Some foods have a higher risk of becoming tainted with bacteria. These include: Marland Kitchen Unwashed fresh fruit and vegetables, especially leafy vegetables that can hide dirt and other contaminants . Raw sprouts, such as alfalfa sprouts . Raw or undercooked beef, especially ground beef, or other raw or undercooked meat and poultry . Fatty, fried, or spicy foods immediately before or after treatment.  These can sit heavy on your stomach and make you feel nauseous. . Raw or undercooked shellfish, such as oysters. . Sushi and sashimi, which often contain raw fish.  . Unpasteurized beverages, such as unpasteurized fruit juices, raw milk, raw yogurt, or cider . Undercooked eggs, such as soft boiled, over easy, and poached; raw, unpasteurized eggs; or foods made with raw egg, such as homemade raw cookie dough and homemade mayonnaise Simple steps for food safety Shop smart. . Do not buy food stored or displayed in an unclean area. . Do not buy bruised or damaged fruits or vegetables. . Do not buy cans that have cracks, dents, or bulges. . Pick up foods that can spoil at the end of your shopping trip and store them in a cooler on the way home. Prepare and clean up foods carefully. . Rinse all fresh fruits and vegetables under running water, and dry them with a clean towel or paper towel. . Clean the top of cans before opening them. . After preparing food,  wash your hands for 20 seconds with hot water and soap. Pay special attention to areas between fingers and under nails. . Clean your utensils and dishes with hot water and soap. Marland Kitchen Disinfect your kitchen and cutting boards using 1 teaspoon of liquid, unscented bleach mixed into 1 quart of water.   Dispose of old food. . Eat canned and packaged food before its expiration date (the "use by" or "best before" date). . Consume refrigerated leftovers within 3 to 4 days. After that time, throw out the food. Even if the food does not smell or look spoiled, it still may be unsafe. Some bacteria, such as Listeria, can grow even on foods stored in the refrigerator if they are kept for too long. Take precautions when eating out. . At restaurants, avoid buffets and salad bars where food sits out for a long time and comes in contact with many people. Food can become contaminated when someone with a virus, often a norovirus, or another "bug" handles it. . Put any leftover food in a "to-go" container yourself, rather than having the server do it. And, refrigerate leftovers as soon as you get home. . Choose restaurants that are clean and that are willing to prepare your food as you order it cooked.    MEDICATIONS: Prednisone 20 mg tablet: Take on days 1-5 of chemotherapy.  Take with food.  Acyclovir 400  mg tablet:  Take 1 tablet (400 mg total) by mouth 2 (two) times daily. (help prevent shingles)  Zofran/Ondansetron 8mg  tablet. Take 1 tablet every 8 hours as needed for nausea/vomiting. (#1 nausea med to take, this can constipate)  Compazine/Prochlorperazine 10mg  tablet. Take 1 tablet every 6 hours as needed for nausea/vomiting. (#2 nausea med to take, this can make you sleepy)  EMLA cream. Apply a quarter size amount to port site 1 hour prior to chemo. Do not rub in. Cover with plastic wrap.    Over-the-Counter Meds:  Miralax 1 capful in 8 oz of fluid daily. May increase to two times a day if needed. This  is a stool softener. If this doesn't work proceed you can add:  Senokot S-start with 1 tablet two times a day and increase to 4 tablets two times a day if needed. (total of 8 tablets in a 24 hour period). This is a stimulant laxative.   Call us if this does not help your bowels move.   Imodium 2mg  capsule. Take 2 capsules after the 1st loose stool and then 1 capsule every 2 hours until you go a total of 12 hours without having a loose stool. Call the [facility] if loose stools continue. If diarrhea occurs @ bedtime, take 2 capsules @ bedtime. Then take 2 capsules every 4 hours until morning. Call [facility].     Constipation Sheet *Miralax in 8 oz of fluid daily.  May increase to two times a day if needed.  This is a stool softener.  If this not enough to keep your bowel regular:  You can add:  *Senokot S, start with one tablet twice a day and can increase to 4 tablets twice a day if needed.  This is a stimulant laxative.   Sometimes when you take pain medication you need BOTH a medicine to keep your stool soft and a medicine to help your bowel push it out!  Please call if the above does not work for you.   Do not go more than 2 days without a bowel movement.  It is very important that you do not become constipated.  It will make you feel sick to your stomach (nausea) and can cause abdominal pain and vomiting.    Diarrhea Sheet   If you are having loose stools/diarrhea, please purchase Imodium and begin taking as outlined:  At the first sign of poorly formed or loose stools you should begin taking Imodium(loperamide) 2 mg capsules.  Take two caplets (4mg ) followed by one caplet (2mg ) every 2 hours until you have had no diarrhea for 12 hours.  During the night take two caplets (4mg ) at bedtime and continue every 4 hours during the night until the morning.  Stop taking Imodium only after there is no sign of diarrhea for 12 hours.    Always call the [facility] if you are having  loose stools/diarrhea that you can't get under control.  Loose stools/disrrhea leads to dehydration (loss of water) in your body.  We have other options of trying to get the loose stools/diarrhea to stopped but you must let us know! Nausea Sheet  Zofran/Ondansetron 8mg  tablet. Take 1 tablet every 8 hours as needed for nausea/vomiting. (#1 nausea med to take, this can constipate)  Compazine/Prochlorperazine 10mg  tablet. Take 1 tablet every 6 hours as needed for nausea/vomiting. (#2 nausea med to take, this can make you sleepy)  You can take these medications together or separately.  We would first like  for you to try the Ondansetron by itself and then take the Prochloperizine if needed. But you are allowed to take both medications at the same time if your nausea is that severe.  If you are having persistent nausea (nausea that does not stop) please take these medications on a staggered schedule so that the nausea medication stays in your body.  Please call the [facility] and let us know the amount of nausea that you are experiencing.  If you begin to vomit, you need to call the [facility] and if it is the weekend and you have vomited more than one time and cant get it to stop-go to the Emergency Room.  Persistent nausea/vomiting can lead to dehydration (loss of fluid in your body) and will make you feel terrible.   Ice chips, sips of clear liquids, foods that are @ room temperature, crackers, and toast tend to be better tolerated.    SYMPTOMS TO REPORT AS SOON AS POSSIBLE AFTER TREATMENT:  FEVER GREATER THAN 100.5 F  CHILLS WITH OR WITHOUT FEVER  NAUSEA AND VOMITING THAT IS NOT CONTROLLED WITH YOUR NAUSEA MEDICATION  UNUSUAL SHORTNESS OF BREATH  UNUSUAL BRUISING OR BLEEDING  TENDERNESS IN MOUTH AND THROAT WITH OR WITHOUT PRESENCE OF ULCERS  URINARY PROBLEMS  BOWEL PROBLEMS  UNUSUAL RASH    Wear comfortable clothing and clothing appropriate for easy access to any Portacath or  PICC line. Let us know if there is anything that we can do to make your therapy better!    What to do if you need assistance after hours or on the weekends: CALL [phone].  HOLD on the line, do not hang up.  You will hear multiple messages but at the end you will be connected with a nurse triage line.  They will contact the doctor if necessary.  Most of the time they will be able to assist you.  Do not call the hospital operator.      I have been informed and understand all of the instructions given to me and have received a copy. I have been instructed to call the clinic [phone] or my family physician as soon as possible for continued medical care, if indicated. I do not have any more questions at this time but understand that I may call the [place] or the Patient Navigator at [phone] during office hours should I have questions or need assistance in obtaining follow-up care.

## 2017-06-30 NOTE — Progress Notes (Signed)
Patient presented today for PICC line placement.  Labs drawn and one liter of fluids given per orders. Dressing changes and flushes set up.   Treatment given per orders. Patient tolerated it well without problems. Vitals stable and discharged home from clinic via wheelchair. Follow up as scheduled.

## 2017-06-30 NOTE — Progress Notes (Signed)
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 [facility]) - Plan: CBC with Differential/Platelet, Comprehensive metabolic panel, Lactate dehydrogenase, Beta 2 microglobulin, serum, Hepatitis B surface antibody, Hepatitis B surface antigen, Hepatitis c vrs RNA detect by PCR-qual, Hepatitis B core antibody, total, HIV antibody (with reflex), Uric acid, Uric acid, Hepatitis c vrs RNA detect by PCR-qual, Hepatitis B surface antigen, Hepatitis B surface antibody, Beta 2 microglobulin, serum, Lactate dehydrogenase, Comprehensive metabolic panel, CBC with Differential/Platelet, Protime-INR, APTT, APTT, Protime-INR  Diffuse large B-cell lymphoma, unspecified body region (HCC) - Plan: allopurinol (ZYLOPRIM) 100 MG tablet, Calcium, ionized, Basic metabolic panel, Uric acid, DISCONTINUED: zolendronic acid (ZOMETA) 4 mg in sodium chloride 0.9 % 100 mL IVPB  Hypercalcemia - Plan: Calcium, ionized, Basic metabolic panel, Uric acid, DISCONTINUED: zolendronic acid (ZOMETA) 4 mg in sodium chloride 0.9 % 100 mL IVPB  Staging Cancer Staging No matching staging information was found for the patient.  Assessment and Plan: 1. DLBCL.  77 year old female with a history of coronary artery disease, cardia myopathy, CKD stage III, COPD, tobacco abuse, orthostatic hypotension, SLE/Sjogren's, and remote history of lymphoma presenting with epigastric pain and rib pain. The patient had a choking episode resulting in a Heimlich maneuver performed on her on 05/26/2017. Since that period of time, the patient has been complaining of "rib pain", epigastric pain, and right upper quadrant pain without exacerbating or alleviating factors. She denies any fevers, chills, nausea, vomiting, diarrhea, dysuria, hematuria. CT of the chest, abdomen, and pelvis revealed multiple abnormalities. It showed LULbronchiectasis. There is also bronchiectasis of the bilateral lower lobes with mucous plugging. There are scattered  groundglass densities in the LLL and RML.There was also a new hypodense liver masses and aLUQmasslike process between the spleen and left upper kidney.  The MRI of the abdomen and pelvis suggested numerous liver masses with a left retroperitoneal mass at the pancreatic tail.  IR was consulted for liver biopsy planned for 06/03/2017.  On 06/03/2017, IR did not feel comfortable performing liver biospy due to patient's drop in Hgb. Pt was subsequently set up for liver biopsy that was done on 06/13/2017 with pathology returning as DLBCL.  She has also undergone PET scan and is here with her family to go over results.  She has also undergone ECHO on 06/01/2017 that shows EF 55%.   Pt reports some fatigue and weight loss.  Pt is seen today for consultation due to DLBCL.   Long talk held with pt and family today.  She is showing evidence of advanced diseased based on adenopathy noted on PET done 06/26/2017  as well as suspicion for bone involvement.  I have informed them she will need to undergo bone marrow aspiration and biopsy in order to complete her staging evaluation.  I have discussed with them a standard regimen of RCHOP and side effects of the medication were reviewed with pt and she was given written information.  She will RTC in 7-10 days to go over BM biopsy results.  She will also be set up for PICC line to facilitate chemotherapy.  Will check Hep labs and HIV prior to Rituxan.  She will also be recommended for monthly Xgeva due to bone involvement reported on PET scan.    2. Hypercalcemia.  Labs done show Ca 13 Cr 3.  She will receive IVF in clinic and Zometa.  Will repeat chemistries next week.    3.  Elevated Uric acid.  She will need treatment with Rasburicase once therapy begins.  She is  started empirically on Allopurinol.    4.  Fatigue.  This is likely due to elevated calcium.  Will determine if symptoms improve with IVF and Zometa.  Hb 11.      HPI:   77 year old female with a history of  coronary artery disease, cardia myopathy, CKD stage III, COPD, tobacco abuse, orthostatic hypotension, SLE/Sjogren's, and remote history of lymphoma presenting with epigastric pain and rib pain. The patient had a choking episode resulting in a Heimlich maneuver performed on her on 05/26/2017. Since that period of time, the patient has been complaining of "rib pain", epigastric pain, and right upper quadrant pain without exacerbating or alleviating factors. She denies any fevers, chills, nausea, vomiting, diarrhea, dysuria, hematuria. CT of the chest, abdomen, and pelvis revealed multiple abnormalities. It showed LULbronchiectasis. There is also bronchiectasis of the bilateral lower lobes with mucous plugging. There are scattered groundglass densities in the LLL and RML.There was also a new hypodense liver masses and aLUQmasslike process between the spleen and left upper kidney.  The MRI of the abdomen and pelvis suggested numerous liver masses with a left retroperitoneal mass at the pancreatic tail.  IR was consulted for liver biopsy.  This was performed 06/03/2017.  On 06/03/2017, IR did not feel comfortable performing liver biospy due to patient's drop in Hgb. Pt was subsequently set up for liver biopsy that was done on 06/13/2017 with pathology returning as DLBCL.  She has also undergone PET scan and is here with her family to go over results.  She has also undergone ECHO.   Pt reports some fatigue and weight loss.  Pt is seen today for consultation due to DLBCL.     Problem List Patient Active Problem List   Diagnosis Date Noted  . Liver masses [R16.0] 05/31/2017  . Acute renal failure superimposed on stage 4 chronic kidney disease ([place]) [N17.9, N18.4] 05/31/2017  . Bronchiectasis ([place]) [J47.9] 05/31/2017  . AKI (acute kidney injury) ([place]) [N17.9] 05/30/2017  . Rheumatoid arthritis ([place]) [M06.9] 05/30/2017  . Chronic lower back pain [M54.5, G89.29] 05/30/2017  . Abnormal CT of the  abdomen [R93.5] 05/30/2017  . Abnormal CT of the chest [R93.89] 05/30/2017  . Hypercalcemia [E83.52] 05/30/2017  . Hypothyroid [E03.9] 07/03/2016  . Anxiety [F41.9] 07/03/2016  . Chronic back pain [M54.9, G89.29] 07/03/2016  . Lupus (systemic lupus erythematosus) ([place]) [M32.9] 11/02/2015  . Syncope [R55] 11/02/2015  . Chronic systolic CHF (congestive heart failure) ([place]) [I50.22] 10/31/2015  . Dehydration [E86.0] 10/28/2015  . Near syncope [R55] 10/28/2015  . CKD (chronic kidney disease) stage 4, GFR 15-29 ml/min (HCC) [N18.4] 10/28/2015  . Anemia of chronic disease [D63.8]   . Cardiomyopathy, ischemic [I25.5] 10/05/2015  . Abnormal nuclear stress test [R94.39] 10/05/2015  . Acute bronchitis with COPD ([place]) [J44.0, J20.9] 09/28/2015  . Recent NSTEMI (non-ST elevated myocardial infarction) ([place]) [I21.4] 09/15/2015  . Fever [R50.9] 09/15/2015  . Non-ST elevated myocardial infarction ([place]) [I21.4] 09/15/2015  . PMB (postmenopausal bleeding) [N95.0] 06/12/2015  . Atypical chest pain [R07.89] 07/13/2014  . DOE (dyspnea on exertion) [R06.09] 10/22/2013  . CHF, acute ([place]) [I50.9] 10/22/2013  . Systemic lupus erythematosus ([place]) [M32.9]   . Coronary artery disease involving native heart with angina pectoris ([place]) [I25.119]   . Palpitations [R00.2] 04/21/2013  . Chronic anticoagulation [Z79.01] 01/29/2012  . Pulmonary embolism ([place]) [I26.99] 11/28/2011  . Chest pain [R07.9] 09/06/2011  . Anemia [D64.9] 05/24/2011  . CKD (chronic kidney disease), stage IV ([place]) [N18.4] 05/24/2011  . Essential hypertension [I10]   .  Hyperlipidemia [E78.5]   . Arteriosclerotic cardiovascular disease (ASCVD) [I25.10]   . Sjogren's syndrome ([place]) [M35.00]   . Tobacco abuse [Z72.0] 08/08/2010  . WEIGHT LOSS, ABNORMAL [R63.4] 07/05/2010  . Orthostatic hypotension [I95.1] 01/24/2010  . SYNCOPE [R55] 08/28/2009  . LYMPHOMA [C85.89] 10/31/2008    Past Medical History Past Medical History:  Diagnosis Date   . Anemia   . CAD (coronary artery disease)    DES to mid RCA 2006  . Cardiomyopathy ([place])   . Chronic lower back pain   . CKD (chronic kidney disease) stage 3, GFR 30-59 ml/min (HCC)   . COPD (chronic obstructive pulmonary disease) ([place]) Dx'd 09/28/2015  . Depression   . Dysphagia   . History of blood transfusion 09/2015  . Hyperlipidemia   . Kidney stones   . Lymphoma ([place])   . Orthostatic hypotension   . PMB (postmenopausal bleeding) 06/12/2015  . Pneumonia 09/28/2015  . Rheumatoid arthritis ([place])   . Sjogren's syndrome ([place])   . Syncope   . Systemic lupus erythematosus ([place])     Past Surgical History Past Surgical History:  Procedure Laterality Date  . BREAST BIOPSY Left   . CARDIAC CATHETERIZATION N/A 10/06/2015   Procedure: Right/Left Heart Cath and Coronary Angiography;  Surgeon: Leonie Man, MD;  Location: [facility];  Service: Cardiovascular;  Laterality: N/A;  . CATARACT EXTRACTION W/ INTRAOCULAR LENS  IMPLANT, BILATERAL Bilateral   . CORONARY ANGIOPLASTY WITH STENT PLACEMENT  2006   DES to mid RCA   . DILATION AND CURETTAGE OF UTERUS    . EYE SURGERY    . LAPAROSCOPIC CHOLECYSTECTOMY    . LITHOTRIPSY  X 3  . LYMPH NODE DISSECTION Left ~ 2000   "neck"  . MASS EXCISION Left ~ 2000   Lymphoma  . RETINAL DETACHMENT SURGERY Right   . TONSILLECTOMY Left ~ 2000  . TUBAL LIGATION      Family History Family History  Problem Relation Age of Onset  . Coronary artery disease Father   . Coronary artery disease Mother   . Liver disease Mother   . Esophageal cancer Brother   . Breast cancer Sister      Social History  reports that she has been smoking cigarettes.  She started smoking about 30 years ago. She has a 5.75 pack-year smoking history. she has never used smokeless tobacco. She reports that she does not drink alcohol or use drugs.  Medications  Current Outpatient Medications:  .  albuterol (PROVENTIL) (2.5 MG/3ML) 0.083% nebulizer solution,  Take 3 mLs (2.5 mg total) by nebulization every 6 (six) hours as needed for wheezing or shortness of breath., Disp: 75 mL, Rfl: 12 .  aspirin 81 MG tablet, Take 81 mg by mouth daily., Disp: , Rfl:  .  atorvastatin (LIPITOR) 40 MG tablet, Take 1 tablet (40 mg total) by mouth daily., Disp: 90 tablet, Rfl: 1 .  clobetasol cream (TEMOVATE) 2.63 %, Apply 1 application topically 2 (two) times daily., Disp: , Rfl:  .  cycloSPORINE (RESTASIS) 0.05 % ophthalmic emulsion, Place 1 drop into both eyes 2 (two) times daily., Disp: , Rfl:  .  FERREX 150 150 MG capsule, Take 1 capsule (150 mg total) by mouth every Monday, Wednesday, and Friday., Disp: 30 capsule, Rfl: 2 .  folic acid (FOLVITE) 1 MG tablet, TAKE 1 TABLET (1 MG TOTAL) BY MOUTH EVERY MORNING., Disp: 90 tablet, Rfl: 1 .  hydroxychloroquine (PLAQUENIL) 200 MG tablet, TAKE 2 TABLET WITH FOOD OR MILK  ONCE A DAY ORALLY 30 DAYS, Disp: 180 tablet, Rfl: 0 .  ketoconazole (NIZORAL) 2 % cream, Apply 1 application topically daily. Use on corner of mouth, Disp: 15 g, Rfl: 0 .  midodrine (PROAMATINE) 10 MG tablet, TAKE 2 TABLETS EVERY MORNING AND TAKE 1 TABLET EVERY EVENING, Disp: 270 tablet, Rfl: 0 .  mirtazapine (REMERON) 15 MG tablet, Take 1 tablet (15 mg total) by mouth at bedtime., Disp: 90 tablet, Rfl: 1 .  Multiple Vitamins-Minerals (PRESERVISION/LUTEIN PO), Take 1 capsule by mouth daily., Disp: , Rfl:  .  nitroGLYCERIN (NITROSTAT) 0.4 MG SL tablet, 1 TABLET UNDER TONGUE AT ONSET OF CHEST PAIN YOU MAY REPEAT EVERY 5 MINUTES FOR UP TO 3 DOSES., Disp: , Rfl: 3 .  omeprazole (PRILOSEC) 20 MG capsule, TAKE 1 CAPSULE BY MOUTH TWICE A DAY, Disp: 180 capsule, Rfl: 0 .  oxyCODONE-acetaminophen (PERCOCET/ROXICET) 5-325 MG tablet, Take 1-2 tablets by mouth every 6 (six) hours as needed for moderate pain or severe pain., Disp: 120 tablet, Rfl: 0 .  polyethylene glycol (MIRALAX / GLYCOLAX) packet, Take 17 g by mouth daily., Disp: 30 each, Rfl: 2 .  senna-docusate  (SENOKOT-S) 8.6-50 MG tablet, Take 1 tablet by mouth 2 (two) times daily. For constipation., Disp: , Rfl:  .  sodium bicarbonate 325 MG tablet, Take 2 tablets (650 mg total) by mouth 3 (three) times daily., Disp: 180 tablet, Rfl: 3 .  Travoprost, BAK Free, (TRAVATAN) 0.004 % SOLN ophthalmic solution, Place 1 drop into both eyes at bedtime., Disp: , Rfl:  .  traZODone (DESYREL) 50 MG tablet, Take 0.5-1 tablets (25-50 mg total) by mouth at bedtime as needed for sleep., Disp: 30 tablet, Rfl: 3 .  allopurinol (ZYLOPRIM) 100 MG tablet, Take 1 tablet (100 mg total) by mouth daily., Disp: 30 tablet, Rfl: 0  Current Facility-Administered Medications:  .  0.9 %  sodium chloride infusion, , Intravenous, Continuous, Pranavi Aure, Mathis Dad, MD  Allergies Florinef [fludrocortisone acetate]; Iohexol; Sulfonamide derivatives; and Tape  Review of Systems Review of Systems - Oncology ROS as per HPI otherwise 12 point ROS is negative. Fatigue, weight loss.     Physical Exam  Vitals Wt Readings from Last 3 Encounters:  06/27/17 115 lb 1.6 oz (52.2 kg)  06/18/17 125 lb 3.2 oz (56.8 kg)  06/13/17 126 lb (57.2 kg)   Temp Readings from Last 3 Encounters:  06/28/17 97.8 F (36.6 C) (Oral)  06/27/17 97.9 F (36.6 C) (Oral)  06/18/17 (!) 96.9 F (36.1 C) (Oral)   BP Readings from Last 3 Encounters:  06/28/17 120/80  06/27/17 (!) 108/56  06/18/17 126/77   Pulse Readings from Last 3 Encounters:  06/28/17 90  06/27/17 81  06/18/17 69   Constitutional: Well-developed, well-nourished, and in no distress.   HENT:  Head: Normocephalic and atraumatic.  Mouth/Throat: No oropharyngeal exudate. Mucosa moist. Eyes: Pupils are equal, round, and reactive to light. Conjunctivae are normal. No scleral icterus.  Neck: Normal range of motion. Neck supple. No JVD present.  Cardiovascular: Normal rate, regular rhythm and normal heart sounds.  Exam reveals no gallop and no friction rub.   No murmur heard. Pulmonary/Chest:  Effort normal and breath sounds normal. No respiratory distress. No wheezes.No rales.  Abdominal: Soft. Bowel sounds are normal. No distension. There is no tenderness. There is no guarding.  Musculoskeletal: No edema or tenderness.  Lymphadenopathy: No palpable cervical, axillary or supraclavicular adenopathy.  Neurological: Alert and oriented to person, place, and time. No cranial nerve deficit.  Skin: Skin  is warm and dry. No rash noted. No erythema. No pallor.  Psychiatric: Affect and judgment normal.   Labs Office Visit on 06/27/2017  Component Date Value Ref Range Status  . Hep B Core Total Ab 06/27/2017 Negative  Negative Final   Comment: (NOTE) Performed At: [facility] [place], [place] [id number] Rush Farmer MD [id number] Performed at [facility], [address], [place], [place] [address]   . HIV Screen 4th Generation wRfx 06/27/2017 Non Reactive  Non Reactive Final   Comment: (NOTE) Performed At: [facility] [place], [place] [id number] Rush Farmer MD [id number] Performed at [facility], [address] [place], [place] [address]   . Uric Acid, Serum 06/27/2017 11.5* 2.3 - 6.6 mg/dL Final   Performed at [facility], [address], [place], [place] [address]  . Hepatitis B Surface Ag 06/27/2017 Negative  Negative Final   Comment: (NOTE) Performed At: [facility] [place], [place] [id number] Rush Farmer MD [id number] Performed at [facility], [address] [place], [place] [address]   . Hep B S Ab 06/27/2017 Non Reactive   Final   Comment: (NOTE)              Non Reactive: Inconsistent with immunity,                            less than 10 mIU/mL              Reactive:     Consistent with immunity,                            greater than 9.9 mIU/mL Performed At: [facility] [place], [place] [id number] Rush Farmer MD [id number] Performed at [facility], [address] [place], [place] [address]   . Beta-2 Microglobulin 06/27/2017 19.9* 0.6 - 2.4 mg/L Final   Comment: (NOTE) Siemens Immulite 2000 Immunochemiluminometric assay (ICMA) Values obtained with different assay methods or kits cannot be used interchangeably. Results cannot be interpreted as absolute evidence of the presence or absence of malignant disease. Performed At: [facility] [place], [place] [id number] Rush Farmer MD [id number] Performed at [facility], [address] [place], [place] [address]   . LDH 06/27/2017 405* 98 - 192 U/L Final   Performed at [facility], [address], [place], [place] [address]  . Sodium 06/27/2017 137  135 - 145 mmol/L Final  . Potassium 06/27/2017 3.7  3.5 - 5.1 mmol/L Final  . Chloride 06/27/2017 101  101 - 111 mmol/L Final  . CO2 06/27/2017 18* 22 - 32 mmol/L Final  . Glucose, Bld 06/27/2017 90  65 - 99 mg/dL Final  . BUN 06/27/2017 51* 6 - 20 mg/dL Final  . Creatinine, Ser 06/27/2017 3.01* 0.44 - 1.00 mg/dL Final  . Calcium 06/27/2017 13.0* 8.9 - 10.3 mg/dL Final  . Total Protein 06/27/2017 7.5  6.5 - 8.1 g/dL Final  . Albumin 06/27/2017 3.4* 3.5 - 5.0 g/dL Final  . AST 06/27/2017 48* 15 - 41 U/L Final  . ALT 06/27/2017 24  14 - 54 U/L Final  . Alkaline Phosphatase 06/27/2017 173* 38 - 126 U/L Final  . Total Bilirubin 06/27/2017 1.0  0.3 - 1.2 mg/dL Final  . GFR calc non Af Amer 06/27/2017 14* >60 mL/min Final  . GFR calc Af Wyvonnia Lora 06/27/2017  16* >60 mL/min Final   Comment: (NOTE) The eGFR has been calculated using the CKD EPI equation. This calculation has not been validated in all clinical situations. eGFR's persistently <60 mL/min signify possible Chronic Kidney Disease.   Georgiann Hahn gap 06/27/2017 18* 5 - 15 Final   Performed at [facility], [address], [place], [place] [address]  . WBC 06/27/2017 7.3  4.0 - 10.5 K/uL Final  . RBC 06/27/2017 4.02  3.87 - 5.11 MIL/uL Final  . Hemoglobin  06/27/2017 11.8* 12.0 - 15.0 g/dL Final  . HCT 06/27/2017 37.6  36.0 - 46.0 % Final  . MCV 06/27/2017 93.5  78.0 - 100.0 fL Final  . MCH 06/27/2017 29.4  26.0 - 34.0 pg Final  . MCHC 06/27/2017 31.4  30.0 - 36.0 g/dL Final  . RDW 06/27/2017 16.9* 11.5 - 15.5 % Final  . Platelets 06/27/2017 112* 150 - 400 K/uL Final   SPECIMEN CHECKED FOR CLOTS  . Neutrophils Relative % 06/27/2017 72  % Final  . Neutro Abs 06/27/2017 5.2  1.7 - 7.7 K/uL Final  . Lymphocytes Relative 06/27/2017 14  % Final  . Lymphs Abs 06/27/2017 1.0  0.7 - 4.0 K/uL Final  . Monocytes Relative 06/27/2017 14  % Final  . Monocytes Absolute 06/27/2017 1.0  0.1 - 1.0 K/uL Final  . Eosinophils Relative 06/27/2017 0  % Final  . Eosinophils Absolute 06/27/2017 0.0  0.0 - 0.7 K/uL Final  . Basophils Relative 06/27/2017 0  % Final  . Basophils Absolute 06/27/2017 0.0  0.0 - 0.1 K/uL Final   Performed at [facility], [address], [place], [place] [address]  . aPTT 06/27/2017 26  24 - 36 seconds Final   Performed at [facility], [address], [place], [place] [address]  . Prothrombin Time 06/27/2017 13.7  11.4 - 15.2 seconds Final  . INR 06/27/2017 1.06   Final   Performed at [facility], [address], [place], [place] [address]  Hospital Outpatient Visit on 06/26/2017  Component Date Value Ref Range Status  . Glucose-Capillary 06/26/2017 76  65 - 99 mg/dL Final    [facility] done 06/01/2017:  EF 55%  Path from liver biopsy done 06/13/2017:  Liver, biopsy, Right lobe - DIFFUSE LARGE B-CELL LYMPHOMA - SEE COMMENT  Pet scan done 06/26/2017:   IMPRESSION: 1. Hypermetabolic disease in the right neck, involving lymph nodes of the axillary regions, right thoracic inlet, mediastinum, and both hila. There is a markedly hypermetabolic right middle lobe nodule associated. 2. In the abdomen and pelvis, the pancreatic tail lesion is markedly hypermetabolic with hypermetabolic liver metastases, hepato duodenal ligament lymphadenopathy,  retroperitoneal lymphadenopathy and hypermetabolic lymph nodes in each groin. 3. Scattered bony metastases in the thoracolumbar spine, bony pelvis, and left proximal femur. 4.  Aortic Atherosclerois (ICD10-170.0) 5. Question atypical infection in the left lower lobe.     Orders Placed This Encounter  Procedures  . CBC with Differential/Platelet    Standing Status:   Future    Number of Occurrences:   1    Standing Expiration Date:   06/27/2018  . Comprehensive metabolic panel    Standing Status:   Future    Number of Occurrences:   1    Standing Expiration Date:   06/27/2018  . Lactate dehydrogenase    Standing Status:   Future    Number of Occurrences:   1    Standing Expiration Date:   06/27/2018  . Beta 2 microglobulin, serum  Standing Status:   Future    Number of Occurrences:   1    Standing Expiration Date:   06/27/2018  . Hepatitis B surface antibody    Standing Status:   Future    Number of Occurrences:   1    Standing Expiration Date:   06/27/2018  . Hepatitis B surface antigen    Standing Status:   Future    Number of Occurrences:   1    Standing Expiration Date:   06/27/2018  . Hepatitis c vrs RNA detect by PCR-qual    Standing Status:   Future    Number of Occurrences:   1    Standing Expiration Date:   06/27/2018  . Hepatitis B core antibody, total  . HIV antibody (with reflex)  . Uric acid    Standing Status:   Future    Number of Occurrences:   1    Standing Expiration Date:   06/27/2018  . Protime-INR    Standing Status:   Future    Number of Occurrences:   1    Standing Expiration Date:   06/27/2018  . APTT    Standing Status:   Future    Number of Occurrences:   1    Standing Expiration Date:   06/27/2018  . Calcium, ionized    Standing Status:   Future    Number of Occurrences:   1    Standing Expiration Date:   06/27/2018  . Basic metabolic panel    Standing Status:   Future    Standing Expiration Date:   06/27/2018  . Uric acid    Standing  Status:   Future    Standing Expiration Date:   06/27/2018       Zoila Shutter MD

## 2017-06-30 NOTE — Patient Instructions (Signed)
[place] at [facility] Discharge Instructions  RECOMMENDATIONS MADE BY THE CONSULTANT AND ANY TEST RESULTS WILL BE SENT TO YOUR REFERRING PHYSICIAN.  Single lumen PICC line inserted today. Labs and IVF's given Follow up as scheduled.  Thank you for choosing [place] at [facility] to provide your oncology and hematology care.  To afford each patient quality time with our provider, please arrive at least 15 minutes before your scheduled appointment time.    If you have a lab appointment with the [facility] please come in thru the  Main Entrance and check in at the main information desk  You need to re-schedule your appointment should you arrive 10 or more minutes late.  We strive to give you quality time with our providers, and arriving late affects you and other patients whose appointments are after yours.  Also, if you no show three or more times for appointments you may be dismissed from the clinic at the providers discretion.     Again, thank you for choosing [facility].  Our hope is that these requests will decrease the amount of time that you wait before being seen by our physicians.       _____________________________________________________________  Should you have questions after your visit to [facility], please contact our office at [phone] between the hours of 8:30 a.m. and 4:30 p.m.  Voicemails left after 4:30 p.m. will not be returned until the following business day.  For prescription refill requests, have your pharmacy contact our office.       Resources For Cancer Patients and their Caregivers ? American Cancer Society: Can assist with transportation, wigs, general needs, runs Look Good Feel Better.        (727)722-5372 ? Cancer Care: Provides financial assistance, online support groups, medication/co-pay assistance.  1-800-813-HOPE 802-184-9058) ? [place] Assists [place] cancer patients and their families through emotional , educational and financial support.  548-349-8137 ? [place] DSS Where to apply for food stamps, Medicaid and utility assistance. 2237144255 ? [employer]: Transportation to medical appointments. 319-065-3155 ? Social Security Administration: May apply for disability if have a Stage IV cancer. 201-113-3110 402 125 9152 ? [employer], Disability and Transit Services: Assists with nutrition, care and transit needs. [place] Support Programs: @10RELATIVEDAYS @ > Cancer Support Group  2nd Tuesday of the month 1pm-2pm, Journey Room  > Creative Journey  3rd Tuesday of the month 1130am-1pm, Journey Room  > Look Good Feel Better  1st Wednesday of the month 10am-12 noon, Journey Room (Call [facility] to register [phone])

## 2017-06-30 NOTE — Progress Notes (Signed)
Chemotherapy education pulled together and pt taught.  Extensive teaching packet given.  Consent for x-geva signed.

## 2017-07-01 ENCOUNTER — Other Ambulatory Visit (HOSPITAL_COMMUNITY): Payer: Self-pay | Admitting: Adult Health

## 2017-07-01 ENCOUNTER — Other Ambulatory Visit (HOSPITAL_COMMUNITY): Payer: Self-pay | Admitting: Pharmacist

## 2017-07-01 ENCOUNTER — Encounter (HOSPITAL_COMMUNITY): Payer: Self-pay | Admitting: Adult Health

## 2017-07-01 ENCOUNTER — Other Ambulatory Visit (HOSPITAL_COMMUNITY): Payer: Self-pay | Admitting: *Deleted

## 2017-07-01 ENCOUNTER — Telehealth (HOSPITAL_COMMUNITY): Payer: Self-pay | Admitting: Emergency Medicine

## 2017-07-01 DIAGNOSIS — C8330 Diffuse large B-cell lymphoma, unspecified site: Secondary | ICD-10-CM

## 2017-07-01 DIAGNOSIS — G4700 Insomnia, unspecified: Secondary | ICD-10-CM

## 2017-07-01 DIAGNOSIS — C8338 Diffuse large B-cell lymphoma, lymph nodes of multiple sites: Secondary | ICD-10-CM | POA: Insufficient documentation

## 2017-07-01 MED ORDER — TEMAZEPAM 30 MG PO CAPS: 30.0000 mg | ORAL_CAPSULE | Freq: Every evening | ORAL | 1 refills | Status: AC | PRN

## 2017-07-01 NOTE — Progress Notes (Signed)
Patient/family called office & spoke with Joanne Gavel, RN requesting something to help with sleep.  She has taken Trazadone in the past, which has not been helpful.   [place] Controlled Substance Reporting System reviewed. She has taken Valium in the past, which reportedly also wasn't that helpful.   Will give Rx for Temazepam 30 mg at bedtime PRN. E-scribed to her pharmacy ([facility]).    Mike Craze, NP [place] [phone]

## 2017-07-01 NOTE — Telephone Encounter (Signed)
Spoke with Mike Craze NP about something for sleep for the pt.  Pt is taking trazadone 50 mg 0.5 to 1 tablet as needed at night for sleep.  Pt states that this does not help at all.  We have called in temazepam 30 mg and she can take 1 tablet as needed for sleep at night.  Explained to the daughter.  She does not need to take the trazadone with the temazepam.  She verbalized understanding.

## 2017-07-02 ENCOUNTER — Other Ambulatory Visit: Payer: Self-pay | Admitting: Radiology

## 2017-07-02 ENCOUNTER — Other Ambulatory Visit: Payer: Self-pay | Admitting: Student

## 2017-07-02 ENCOUNTER — Other Ambulatory Visit (HOSPITAL_COMMUNITY): Payer: Self-pay | Admitting: Internal Medicine

## 2017-07-02 DIAGNOSIS — C8330 Diffuse large B-cell lymphoma, unspecified site: Secondary | ICD-10-CM

## 2017-07-03 ENCOUNTER — Telehealth (HOSPITAL_COMMUNITY): Payer: Self-pay | Admitting: Emergency Medicine

## 2017-07-03 ENCOUNTER — Encounter (HOSPITAL_COMMUNITY): Payer: Medicare Other

## 2017-07-03 ENCOUNTER — Ambulatory Visit (HOSPITAL_COMMUNITY)
Admission: RE | Admit: 2017-07-03 | Discharge: 2017-07-03 | Disposition: A | Discharge status: Home / Self Care | Payer: Medicare Other | Source: Ambulatory Visit | Attending: Internal Medicine | Admitting: Internal Medicine

## 2017-07-03 ENCOUNTER — Encounter (HOSPITAL_COMMUNITY): Payer: Self-pay

## 2017-07-03 DIAGNOSIS — G8929 Other chronic pain: Secondary | ICD-10-CM | POA: Diagnosis not present

## 2017-07-03 DIAGNOSIS — D630 Anemia in neoplastic disease: Secondary | ICD-10-CM | POA: Diagnosis not present

## 2017-07-03 DIAGNOSIS — M545 Low back pain: Secondary | ICD-10-CM | POA: Diagnosis not present

## 2017-07-03 DIAGNOSIS — Z882 Allergy status to sulfonamides status: Secondary | ICD-10-CM | POA: Diagnosis not present

## 2017-07-03 DIAGNOSIS — I2510 Atherosclerotic heart disease of native coronary artery without angina pectoris: Secondary | ICD-10-CM | POA: Diagnosis not present

## 2017-07-03 DIAGNOSIS — C8330 Diffuse large B-cell lymphoma, unspecified site: Secondary | ICD-10-CM | POA: Insufficient documentation

## 2017-07-03 DIAGNOSIS — Z7982 Long term (current) use of aspirin: Secondary | ICD-10-CM | POA: Insufficient documentation

## 2017-07-03 DIAGNOSIS — M069 Rheumatoid arthritis, unspecified: Secondary | ICD-10-CM | POA: Diagnosis not present

## 2017-07-03 DIAGNOSIS — M329 Systemic lupus erythematosus, unspecified: Secondary | ICD-10-CM | POA: Diagnosis not present

## 2017-07-03 DIAGNOSIS — F17210 Nicotine dependence, cigarettes, uncomplicated: Secondary | ICD-10-CM | POA: Insufficient documentation

## 2017-07-03 DIAGNOSIS — N183 Chronic kidney disease, stage 3 (moderate): Secondary | ICD-10-CM | POA: Diagnosis not present

## 2017-07-03 DIAGNOSIS — Z955 Presence of coronary angioplasty implant and graft: Secondary | ICD-10-CM | POA: Diagnosis not present

## 2017-07-03 DIAGNOSIS — M3500 Sicca syndrome, unspecified: Secondary | ICD-10-CM | POA: Diagnosis not present

## 2017-07-03 DIAGNOSIS — E785 Hyperlipidemia, unspecified: Secondary | ICD-10-CM | POA: Diagnosis not present

## 2017-07-03 DIAGNOSIS — F329 Major depressive disorder, single episode, unspecified: Secondary | ICD-10-CM | POA: Diagnosis not present

## 2017-07-03 DIAGNOSIS — C8590 Non-Hodgkin lymphoma, unspecified, unspecified site: Secondary | ICD-10-CM | POA: Diagnosis present

## 2017-07-03 DIAGNOSIS — J449 Chronic obstructive pulmonary disease, unspecified: Secondary | ICD-10-CM | POA: Diagnosis not present

## 2017-07-03 DIAGNOSIS — I429 Cardiomyopathy, unspecified: Secondary | ICD-10-CM | POA: Insufficient documentation

## 2017-07-03 LAB — CBC WITH DIFFERENTIAL/PLATELET
BASOS ABS: 0 10*3/uL (ref 0.0–0.1)
BASOS PCT: 0 %
EOS ABS: 0.1 10*3/uL (ref 0.0–0.7)
EOS PCT: 0 %
HCT: 31.9 % — ABNORMAL LOW (ref 36.0–46.0)
Hemoglobin: 10.1 g/dL — ABNORMAL LOW (ref 12.0–15.0)
Lymphocytes Relative: 15 %
Lymphs Abs: 1.8 10*3/uL (ref 0.7–4.0)
MCH: 30 pg (ref 26.0–34.0)
MCHC: 31.7 g/dL (ref 30.0–36.0)
MCV: 94.7 fL (ref 78.0–100.0)
MONO ABS: 0.8 10*3/uL (ref 0.1–1.0)
Monocytes Relative: 7 %
Neutro Abs: 8.8 10*3/uL — ABNORMAL HIGH (ref 1.7–7.7)
Neutrophils Relative %: 78 %
PLATELETS: 75 10*3/uL — AB (ref 150–400)
RBC: 3.37 MIL/uL — ABNORMAL LOW (ref 3.87–5.11)
RDW: 17 % — AB (ref 11.5–15.5)
WBC: 11.4 10*3/uL — ABNORMAL HIGH (ref 4.0–10.5)

## 2017-07-03 MED ORDER — MIDAZOLAM HCL 2 MG/2ML IJ SOLN
INTRAMUSCULAR | Status: AC
  Filled 2017-07-03: qty 2

## 2017-07-03 MED ORDER — ONDANSETRON HCL 8 MG PO TABS: 8.0000 mg | ORAL_TABLET | Freq: Two times a day (BID) | ORAL | 1 refills | Status: DC | PRN

## 2017-07-03 MED ORDER — FENTANYL CITRATE (PF) 100 MCG/2ML IJ SOLN
INTRAMUSCULAR | Status: AC | PRN
  Administered 2017-07-03: 25 ug via INTRAVENOUS
  Administered 2017-07-03: 50 ug via INTRAVENOUS

## 2017-07-03 MED ORDER — HEPARIN SOD (PORK) LOCK FLUSH 100 UNIT/ML IV SOLN
250.0000 [IU] | INTRAVENOUS | Status: AC | PRN
  Administered 2017-07-03: 250 [IU]
  Filled 2017-07-03: qty 5

## 2017-07-03 MED ORDER — LIDOCAINE-PRILOCAINE 2.5-2.5 % EX CREA: TOPICAL_CREAM | CUTANEOUS | 3 refills | Status: DC

## 2017-07-03 MED ORDER — ACYCLOVIR 400 MG PO TABS: 400.0000 mg | ORAL_TABLET | Freq: Two times a day (BID) | ORAL | 3 refills | Status: AC

## 2017-07-03 MED ORDER — PREDNISONE 20 MG PO TABS: ORAL_TABLET | ORAL | 3 refills | Status: AC

## 2017-07-03 MED ORDER — LIDOCAINE HCL 1 % IJ SOLN
INTRAMUSCULAR | Status: AC | PRN
  Administered 2017-07-03: 10 mL via INTRADERMAL

## 2017-07-03 MED ORDER — SODIUM CHLORIDE 0.9 % IV SOLN
INTRAVENOUS | Status: DC
  Administered 2017-07-03: 08:00:00 via INTRAVENOUS

## 2017-07-03 MED ORDER — MIDAZOLAM HCL 2 MG/2ML IJ SOLN
INTRAMUSCULAR | Status: AC | PRN
  Administered 2017-07-03: 0.5 mg via INTRAVENOUS

## 2017-07-03 MED ORDER — FENTANYL CITRATE (PF) 100 MCG/2ML IJ SOLN
INTRAMUSCULAR | Status: AC
  Filled 2017-07-03: qty 2

## 2017-07-03 MED ORDER — PROCHLORPERAZINE MALEATE 10 MG PO TABS: 10.0000 mg | ORAL_TABLET | Freq: Four times a day (QID) | ORAL | 6 refills | Status: DC | PRN

## 2017-07-03 MED ORDER — FLUCONAZOLE 100 MG PO TABS: 100.0000 mg | ORAL_TABLET | Freq: Every day | ORAL | 0 refills | Status: AC

## 2017-07-03 NOTE — H&P (Signed)
Chief Complaint: Patient was seen in consultation today for bone marrow biopsy at the request of Higgs,Vetta  Referring Physician(s): Higgs,Vetta  Supervising Physician: Marybelle Killings  Patient Status: [facility] - Out-pt  History of Present Illness: Tiffany [name] is a 77 y.o. female with hx of lymphoma. She is referred for staging bone marrow biopsy. PMHx, meds, labs, imaging reviewed. Has been NPO this am.  Past Medical History:  Diagnosis Date  . Anemia   . CAD (coronary artery disease)    DES to mid RCA 2006  . Cardiomyopathy ([place])   . Chronic lower back pain   . CKD (chronic kidney disease) stage 3, GFR 30-59 ml/min (HCC)   . COPD (chronic obstructive pulmonary disease) ([place]) Dx'd 09/28/2015  . Depression   . Dysphagia   . History of blood transfusion 09/2015  . Hyperlipidemia   . Kidney stones   . Lymphoma ([place])   . Orthostatic hypotension   . PMB (postmenopausal bleeding) 06/12/2015  . Pneumonia 09/28/2015  . Rheumatoid arthritis ([place])   . Sjogren's syndrome ([place])   . Syncope   . Systemic lupus erythematosus ([place])     Past Surgical History:  Procedure Laterality Date  . BREAST BIOPSY Left   . CARDIAC CATHETERIZATION N/A 10/06/2015   Procedure: Right/Left Heart Cath and Coronary Angiography;  Surgeon: Leonie Man, MD;  Location: [place] CV LAB;  Service: Cardiovascular;  Laterality: N/A;  . CATARACT EXTRACTION W/ INTRAOCULAR LENS  IMPLANT, BILATERAL Bilateral   . CORONARY ANGIOPLASTY WITH STENT PLACEMENT  2006   DES to mid RCA   . DILATION AND CURETTAGE OF UTERUS    . EYE SURGERY    . LAPAROSCOPIC CHOLECYSTECTOMY    . LITHOTRIPSY  X 3  . LYMPH NODE DISSECTION Left ~ 2000   "neck"  . MASS EXCISION Left ~ 2000   Lymphoma  . RETINAL DETACHMENT SURGERY Right   . TONSILLECTOMY Left ~ 2000  . TUBAL LIGATION      Allergies: Florinef [fludrocortisone acetate]; Iohexol; Sulfonamide derivatives; and Tape  Medications: Prior to Admission medications     Medication Sig Start Date End Date Taking? Authorizing Provider  albuterol (PROVENTIL) (2.5 MG/3ML) 0.083% nebulizer solution Take 3 mLs (2.5 mg total) by nebulization every 6 (six) hours as needed for wheezing or shortness of breath. 09/18/15  Yes Robbie Lis, MD  allopurinol (ZYLOPRIM) 100 MG tablet Take 1 tablet (100 mg total) by mouth daily. 06/27/17  Yes Higgs, Mathis Dad, MD  aspirin 81 MG tablet Take 81 mg by mouth daily.   Yes [provider]  atorvastatin (LIPITOR) 40 MG tablet Take 1 tablet (40 mg total) by mouth daily. 03/04/17  Yes Eustaquio Maize, MD  clobetasol cream (TEMOVATE) 4.09 % Apply 1 application topically 2 (two) times daily.   Yes [provider]  cycloSPORINE (RESTASIS) 0.05 % ophthalmic emulsion Place 1 drop into both eyes 2 (two) times daily.   Yes [provider]  FERREX 150 150 MG capsule Take 1 capsule (150 mg total) by mouth every Monday, Wednesday, and Friday. 06/09/17  Yes Eustaquio Maize, MD  folic acid (FOLVITE) 1 MG tablet TAKE 1 TABLET (1 MG TOTAL) BY MOUTH EVERY MORNING. 02/03/17  Yes Eustaquio Maize, MD  hydroxychloroquine (PLAQUENIL) 200 MG tablet TAKE 2 TABLET WITH FOOD OR MILK ONCE A DAY ORALLY 30 DAYS 04/08/17  Yes Eustaquio Maize, MD  ketoconazole (NIZORAL) 2 % cream Apply 1 application topically daily. Use on corner of mouth  01/30/17  Yes Eustaquio Maize, MD  midodrine (PROAMATINE) 10 MG tablet TAKE 2 TABLETS EVERY MORNING AND TAKE 1 TABLET EVERY EVENING 06/23/17  Yes Eustaquio Maize, MD  mirtazapine (REMERON) 15 MG tablet Take 1 tablet (15 mg total) by mouth at bedtime. 03/04/17  Yes Eustaquio Maize, MD  Multiple Vitamins-Minerals (PRESERVISION/LUTEIN PO) Take 1 capsule by mouth daily.   Yes [provider]  omeprazole (PRILOSEC) 20 MG capsule TAKE 1 CAPSULE BY MOUTH TWICE A DAY 04/23/17  Yes Terald Sleeper, PA-C  oxyCODONE-acetaminophen (PERCOCET/ROXICET) 5-325 MG tablet Take 1-2 tablets by mouth every 6 (six) hours as  needed for moderate pain or severe pain. 06/09/17  Yes Eustaquio Maize, MD  polyethylene glycol (MIRALAX / GLYCOLAX) packet Take 17 g by mouth daily. 06/09/17  Yes Eustaquio Maize, MD  senna-docusate (SENOKOT-S) 8.6-50 MG tablet Take 1 tablet by mouth 2 (two) times daily. For constipation. 11/03/15  Yes Rexene Alberts, MD  sodium bicarbonate 325 MG tablet Take 2 tablets (650 mg total) by mouth 3 (three) times daily. 03/04/17  Yes Eustaquio Maize, MD  temazepam (RESTORIL) 30 MG capsule Take 1 capsule (30 mg total) by mouth at bedtime as needed for sleep. 07/01/17  Yes Holley Bouche, NP  Travoprost, BAK Free, (TRAVATAN) 0.004 % SOLN ophthalmic solution Place 1 drop into both eyes at bedtime.   Yes [provider]  CYCLOPHOSPHAMIDE IV Inject into the vein. Every 21 days    [provider]  Denosumab (XGEVA [place]) Inject into the skin. Every 28 days    [provider]  DOXOrubicin HCl (ADRIAMYCIN IV) Inject into the vein. Every 21 days    [provider]  nitroGLYCERIN (NITROSTAT) 0.4 MG SL tablet 1 TABLET UNDER TONGUE AT ONSET OF CHEST PAIN YOU MAY REPEAT EVERY 5 MINUTES FOR UP TO 3 DOSES. 04/23/17   [provider]  Pegfilgrastim (NEULASTA ONPRO [place]) Inject into the skin. Every 21 days    [provider]  RiTUXimab (RITUXAN IV) Inject into the vein. Every 21 days    [provider]  traZODone (DESYREL) 50 MG tablet Take 0.5-1 tablets (25-50 mg total) by mouth at bedtime as needed for sleep. 06/09/17   Eustaquio Maize, MD  vinCRIStine 2 mg in sodium chloride 0.9 % 50 mL Inject into the vein once. Every 21 days    [provider]     Family History  Problem Relation Age of Onset  . Coronary artery disease Father   . Coronary artery disease Mother   . Liver disease Mother   . Esophageal cancer Brother   . Breast cancer Sister     Social History   Socioeconomic History  . Marital status: Divorced    Spouse name: None  .  Number of children: None  . Years of education: None  . Highest education level: None  Social Needs  . Financial resource strain: None  . Food insecurity - worry: None  . Food insecurity - inability: None  . Transportation needs - medical: None  . Transportation needs - non-medical: None  Occupational History  . Occupation: Disabled and retired  Tobacco Use  . Smoking status: Current Every Day Smoker    Packs/day: 0.25    Years: 23.00    Pack years: 5.75    Types: Cigarettes    Start date: 07/14/1986    Last attempt to quit: 02/25/2017    Years since quitting: 0.3  . Smokeless tobacco: Never  Used  Substance and Sexual Activity  . Alcohol use: No    Alcohol/week: 0.0 oz  . Drug use: No  . Sexual activity: No    Birth control/protection: Post-menopausal  Other Topics Concern  . None  Social History Narrative  . None     Review of Systems: A 12 point ROS discussed and pertinent positives are indicated in the HPI above.  All other systems are negative.  Review of Systems  Vital Signs: BP 138/78 (BP Location: Left Arm)   Pulse 88   Temp 98.3 F (36.8 C) (Oral)   Resp 16   SpO2 94%   Physical Exam  Constitutional: She is oriented to person, place, and time. She appears well-developed. No distress.  HENT:  Head: Normocephalic.  Mouth/Throat: Oropharynx is clear and moist.  Neck: Normal range of motion. No JVD present. No tracheal deviation present.  Cardiovascular: Normal rate, regular rhythm and normal heart sounds.  Pulmonary/Chest: Effort normal and breath sounds normal. No respiratory distress.  Neurological: She is alert and oriented to person, place, and time.  Skin: Skin is warm and dry.  Psychiatric: She has a normal mood and affect.    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 EXAM: NUCLEAR MEDICINE PET SKULL BASE TO THIGH TECHNIQUE: 6.6 mCi  F-18 FDG was injected intravenously. Full-ring PET imaging was performed from the skull base to thigh after the radiotracer. CT data was obtained and used for attenuation correction and anatomic localization. FASTING BLOOD GLUCOSE:  Value: 76 mg/dl COMPARISON:  None. FINDINGS: NECK: Asymmetric soft tissue in the right neck, posterior to the mandibular angle is hypermetabolic with SUV max = 4.7 CHEST: Hypermetabolic lymph nodes are seen in the right thoracic inlet, mediastinum, hila bilaterally, and axillary regions index 16 mm short axis subcarinal lymph node demonstrates SUV max = 8.8. 1.9 cm posterior right middle lobe nodule is hypermetabolic with SUV max = 27. Biapical pleuroparenchymal scarring is evident. Small left pleural effusion noted. Bronchial wall thickening with small airway impaction and nodularity in the left base may be related to atypical infection although lymphoma is involvement is not excluded. ABDOMEN/PELVIS: Multiple hypermetabolic liver lesions evident. 3.1 cm lateral segment left liver lesions seen on image 110 demonstrates SUV max = 22.5. Pancreatic tail lesion seen on recent abdominal MRI, likely involving the medial spleen is hypermetabolic with SUV max = 54.6. Hypermetabolic lymphadenopathy is seen in the hepato duodenal ligament and abdominal retroperitoneal space. Hypermetabolic lymph nodes evident in the groin regions bilaterally. There is abdominal aortic atherosclerosis without aneurysm. Multiple renal stones are noted bilaterally. Small volume intraperitoneal free fluid evident SKELETON: Hypermetabolic marrow involvement is evident. L3 lesion demonstrates SUV max = 13.1. posterior right iliac lesion demonstrates SUV max = 4.2. Left femoral neck lesion demonstrates SUV max = 4.0. IMPRESSION: 1. Hypermetabolic disease in the right neck, involving lymph nodes of the axillary regions, right thoracic inlet, mediastinum, and both hila. There is a markedly hypermetabolic right middle lobe  nodule associated. 2. In the abdomen and pelvis, the pancreatic tail lesion is markedly hypermetabolic with hypermetabolic liver metastases, hepato duodenal ligament lymphadenopathy, retroperitoneal lymphadenopathy and hypermetabolic lymph nodes in each groin. 3. Scattered bony metastases in the thoracolumbar spine, bony pelvis, and left proximal femur. 4.  Aortic Atherosclerois (ICD10-170.0) 5. Question atypical infection in the left lower lobe. Electronically Signed   By: Misty Stanley M.D.   On: 06/26/2017  17:36   US Biopsy (liver)  Result Date: 06/13/2017 INDICATION: No known primary, now with multiple liver lesions worrisome for metastatic disease. Please perform ultrasound-guided biopsy for tissue diagnostic purposes. EXAM: ULTRASOUND GUIDED LIVER LESION BIOPSY COMPARISON:  Abdominal MRI - 06/02/2017; CT abdomen and pelvis - 05/30/2017 MEDICATIONS: None ANESTHESIA/SEDATION: Fentanyl 75 mcg IV; Versed 1.5 mg IV Total Moderate Sedation time:  15 Minutes. The patient's level of consciousness and vital signs were monitored continuously by radiology nursing throughout the procedure under my direct supervision. COMPLICATIONS: None immediate. PROCEDURE: Informed written consent was obtained from the patient after a discussion of the risks, benefits and alternatives to treatment. The patient understands and consents the procedure. A timeout was performed prior to the initiation of the procedure. Ultrasound scanning was performed of the right upper abdominal quadrant demonstrates multiple hypoattenuating liver lesions and masses scattered throughout the liver compatible the findings seen on preceding abdominal MRI. A dominant approximately 2.5 x 2.5 cm hypoechoic lesion within the subcapsular aspect the right lobe of the liver (compatible with the lesion seen on preceding abdominal MRI image 24, series 4) was targeted for biopsy given lesion location and sonographic window. The procedure was planned. The right upper  abdominal quadrant was prepped and draped in the usual sterile fashion. The overlying soft tissues were anesthetized with 1% lidocaine with epinephrine. A 17 gauge, 6.8 cm co-axial needle was advanced into a peripheral aspect of the lesion. This was followed by 5 core biopsies with an 18 gauge core device under direct ultrasound guidance. The coaxial needle tract was embolized with a small amount of Gel-Foam slurry and superficial hemostasis was obtained with manual compression. Post procedural scanning was negative for definitive area of hemorrhage or additional complication. A dressing was placed. The patient tolerated the procedure well without immediate post procedural complication. IMPRESSION: Technically successful ultrasound guided core needle biopsy of indeterminate lesion within the subcapsular aspect of the right lobe of the liver. Electronically Signed   By: Sandi Mariscal M.D.   On: 06/13/2017 14:18    Labs:  CBC: Recent Labs    06/04/17 0913 06/09/17 1537 06/13/17 1126 06/27/17 1700  WBC 5.3 7.6 7.8 7.3  HGB 11.3* 12.9 11.6* 11.8*  HCT 36.5 39.0 37.0 37.6  PLT 168 240 234 112*    COAGS: Recent Labs    06/02/17 1235 06/13/17 1126 06/27/17 1700  INR 1.11 1.00 1.06  APTT  --  29 26    BMP: Recent Labs    06/04/17 0746 06/09/17 1537 06/27/17 1700 06/30/17 1239  NA 138 140 137 135  K 4.5 4.2 3.7 3.3*  CL 110 104 101 100*  CO2 18* 17* 18* 21*  GLUCOSE 75 86 90 84  BUN 26* 22 51* 46*  CALCIUM 9.6 10.1 13.0* 11.3*  CREATININE 2.13* 2.33* 3.01* 2.89*  GFRNONAA 22* 20* 14* 15*  GFRAA 25* 23* 16* 17*    LIVER FUNCTION TESTS: Recent Labs    06/03/17 0537 06/04/17 0746 06/09/17 1537 06/27/17 1700  BILITOT 0.7 0.4 0.3 1.0  AST 61* 70* 40 48*  ALT 52 61* 36* 24  ALKPHOS 124 129* 173* 173*  PROT 5.5* 5.7* 6.6 7.5  ALBUMIN 2.2* 2.3* 3.3* 3.4*    TUMOR MARKERS: No results for input(s): AFPTM, CEA, CA199, CHROMGRNA in the last 8760 hours.  Assessment and  Plan: Lymphoma For CT guided bone marrow biopsy Labs reviewed. Risks and benefits discussed with the patient including, but not limited to bleeding, infection, damage to adjacent structures  or low yield requiring additional tests.  All of the patient's questions were answered, patient is agreeable to proceed. Consent signed and in chart.    Thank you for this interesting consult.  I greatly enjoyed meeting Tiffany [name] and look forward to participating in their care.  A copy of this report was sent to the requesting provider on this date.  Electronically Signed: Ascencion Dike, PA-C 07/03/2017, 8:21 AM   I spent a total of 20 minutes in face to face in clinical consultation, greater than 50% of which was counseling/coordinating care for bone marrow biopsy

## 2017-07-03 NOTE — Patient Instructions (Addendum)
[place]   CHEMOTHERAPY INSTRUCTIONS  The doctor is going to switch your chemotherapy to R-CDOP instead of R-CHOP.  The only difference is the adriamycin (doxorubicin) is dropped and with the new treatment you will get Doxil (liposomal doxorubicin).  This will lessen your cardiac toxicity (the effects the chemotherapy could have on your heart).     POTENTIAL SIDE EFFECTS OF TREATMENT:  Liposomal doxorubicin (Doxil)  About This Drug Liposomal doxorubicin is used to treat cancer. It is given in the vein (IV).  Takes 60 minutes to infuse.    Possible Side Effects . Bone marrow depression. This is a decrease in the number of white blood cells, red blood cells, and platelets. This may raise your risk of infection, make you tired and weak (fatigue), and raise your risk of bleeding. . Tiredness and weakness . Fever . Nausea and throwing up (vomiting) . Loose bowel movements (diarrhea) . Constipation (unable to move bowels) . Decreased appetite (decreased hunger) . Soreness of the mouth and throat. You may have red areas, white patches, or sores that hurt. . Hand-and-foot syndrome. The palms of your hands or soles of your feet may tingle, become numb, painful, swollen, or red. . Rash Note: Each of the side effects above was reported in 20% or greater of patients treated with liposomal doxorubicin. Not all possible side effects are included above.  Warnings and Precautions . While you are getting this drug in your vein (IV), you may have a reaction to the drug, which can be life-threatening. Sometimes you may be given medication to stop or lessen these side effects. Your nurse will check you closely for these signs: fever or shaking chills, flushing, facial swelling, feeling dizzy, headache, trouble breathing, rash, itching, chest tightness, or chest pain. These reactions may happen after your infusion. If this happens, call 911 for emergency care. .  Changes in the tissue of the heart and/or congestive heart failure. You may be short of breath. Your arms, hands, legs and feet may swell. . This drug may raise your risk of getting a second cancer, such as oral cancers. . Hand-and-foot syndrome. The palms of your hands or soles of your feet may tingle, become numb, painful, swollen, or red. Note: Some of the side effects above are very rare. If you have concerns and/or questions, please discuss them with your medical team.  Important Information . This drug may be present in the saliva, tears, sweat, urine, stool, vomit, semen, and vaginal secretions. Talk to your doctor and/or your nurse about the necessary precautions to take during this time. . Urine color may turn slightly pink or red within a few hours after you get this drug. Urine should return to normal color within one to two days. . In women, menstrual bleeding may become irregular or stop while you are getting this drug. Do not assume that you cannot become pregnant if you do not have a menstrual period. . Women may go through signs of menopause (change of life) like vaginal dryness or hot flushes.  Treating Side Effects . Drink plenty of fluids (a minimum of eight glasses per day is recommended). . If you throw up or have loose bowel movements, you should drink more fluids so that you do not become dehydrated (lack water in the body from losing too much fluid). . If you get diarrhea, eat low-fiber foods that are high in protein and calories and avoid foods that can irritate your digestive tracts or  lead to cramping. . Ask your nurse or doctor about medicine that can lessen or stop your diarrhea and/or constipation . If you are not able to move your bowels, check with your doctor or nurse before you use enemas, laxatives, or suppositories. . To help with nausea and vomiting, eat small, frequent meals instead of three large meals a day. Choose foods and drinks that are at room  temperature. Ask your nurse or doctor about other helpful tips and medicine that is available to help or stop lessen these symptoms. . Mouth care is very important. Your mouth care should consist of routine, gentle cleaning of your teeth or dentures and rinsing your mouth with a mixture of  teaspoon of salt in 8 ounces of water or  teaspoon of baking soda in 8 ounces of water. This should be done at least after each meal and at bedtime. . If you have mouth sores, avoid mouthwash that has alcohol. Also avoid alcohol and smoking because they can bother your mouth and throat. . To help with decreased appetite, eat small, frequent meals. . Eat high caloric food such as pudding, ice cream, yogurt and milkshakes. . Avoid sun exposure and apply sunscreen routinely when outdoors. . If you get a rash do not put anything on it unless your doctor or nurse says you may. Keep the area around the rash clean and dry. Ask your doctor for medicine if your rash bothers you. . Manage tiredness by pacing your activities for the day. . Be sure to include periods of rest between energy-draining activities. . To decrease infection, wash your hands regularly. . Avoid close contact with people who have a cold, the flu, or other infections. . Take your temperature as your doctor or nurse tells you, and whenever you feel like you may have a fever. . To help decrease bleeding, use a soft toothbrush. Check with your nurse before using dental floss. . Be very careful when using knives or tools. . Use an electric shaver instead of a razor. . To help with possible signs of early menopause, vaginal lubricants can be used to lessen vaginal dryness, itching, and pain during sexual relations. . Infusion reactions may occur after your infusion. If this happens, call 911 for emergency care.  Food and Drug Interactions . There are no known interactions of liposomal doxorubicin with food. . This drug may interact with other  medicines. Tell your doctor and pharmacist about all the prescription and over-the-counter medicines and dietary supplements (vitamins, minerals, herbs and others) that you are taking at this time. Also, check with your doctor or pharmacist before starting any new prescription or over-the-counter medicines, or dietary supplements to make sure that there are no interactions.The safety and use of dietary supplements and alternative diets are often not known. Using these might affect your cancer or interfere with your treatment. Until more is known, you should not use dietary supplements or alternative diets without your cancer doctor's help.  When to Call the Doctor Call your doctor or nurse if you have any of these symptoms and/or any new or unusual symptoms: . Fever of 100.5 F (38 C) or higher . Chills . Trouble breathing . Fatigue that interferes with your daily activities . Feeling dizzy or lightheaded . Easy bleeding or bruising . Swelling of legs, ankles, or feet . Weight gain of 5 pounds in one week (fluid retention) . Loose bowel movements (diarrhea) 4 times a day or loose bowel movements with lack of strength or  a feeling of being dizzy . Pain in your mouth or throat that makes it hard to eat or drink . Nausea that stops you from eating or drinking and/or is not relieved by prescribed medicines . Throwing up more than 3 times a day . Painful, red, or swollen areas on your hands or feet. . Numbness and/or tingling of your hands and/or feet . A new rash or a rash that is not relieved by prescribed medicines . Signs of infusion reaction: fever or shaking chills, flushing, facial swelling, feeling dizzy, headache, trouble breathing, rash, itching, chest tightness, or chest pain. . If you think you may be pregnant or may have impregnated your partner  Reproduction Warnings . Pregnancy warning: This drug can have harmful effects on the unborn baby. Women of child bearing potential  should use effective methods of birth control during your cancer treatment and for 6 months after treatment. Men with female partners of child bearing potential should use effective methods of birth control during your cancer treatment and for 6 months after your cancer treatment. Let your doctor know right away if you think you may be pregnant or may have impregnated your partner. . Breastfeeding warning: Women should not breastfeed during treatment because this drug could enter the breast milk and cause harm to a breastfeeding baby. . Fertility warning: In men and women both, this drug may affect your ability to have children in the future. Talk with your doctor or nurse if you plan to have children. Ask for information on sperm or egg banking.              [facility] Discharge Instructions for Patients Receiving Chemotherapy   Beginning January 23rd 2017 lab work for the [facility] will be done in the  Main lab at [facility] on 1st floor. If you have a lab appointment with the [place] please come in thru the  Main Entrance and check in at the main information desk   Today you received the following chemotherapy agents Rituxan,Doxil,Cytoxan and Vincristine as well as Xgeva injection. Follow-up as scheduled. Call clinic for any questions or concerns  To help prevent nausea and vomiting after your treatment, we encourage you to take your nausea medication   If you develop nausea and vomiting, or diarrhea that is not controlled by your medication, call the clinic.  The clinic phone number is [phone]. Office hours are Monday-Friday 8:30am-5:00pm.  BELOW ARE SYMPTOMS THAT SHOULD BE REPORTED IMMEDIATELY:  *FEVER GREATER THAN 101.0 F  *CHILLS WITH OR WITHOUT FEVER  NAUSEA AND VOMITING THAT IS NOT CONTROLLED WITH YOUR NAUSEA MEDICATION  *UNUSUAL SHORTNESS OF BREATH  *UNUSUAL BRUISING OR BLEEDING  TENDERNESS IN MOUTH AND THROAT WITH OR  WITHOUT PRESENCE OF ULCERS  *URINARY PROBLEMS  *BOWEL PROBLEMS  UNUSUAL RASH Items with * indicate a potential emergency and should be followed up as soon as possible. If you have an emergency after office hours please contact your primary care physician or go to the nearest emergency department.  Please call the clinic during office hours if you have any questions or concerns.   You may also contact the Patient Navigator at [phone] should you have any questions or need assistance in obtaining follow up care.      Resources For Cancer Patients and their Caregivers ? American Cancer Society: Can assist with transportation, wigs, general needs, runs Look Good Feel Better.        (480)468-6341 ? Cancer Care:  Provides financial assistance, online support groups, medication/co-pay assistance.  1-800-813-HOPE 343-376-1245) ? [facility] Assists [place] cancer patients and their families through emotional , educational and financial support.  (712)594-2489 ? [place] DSS Where to apply for food stamps, Medicaid and utility assistance. (570)521-1397 ? [facility]: Transportation to medical appointments. (289)339-0801 ? Social Security Administration: May apply for disability if have a Stage IV cancer. [phone] ? [employer], Disability and Transit Services: Assists with nutrition, care and transit needs. [phone]

## 2017-07-03 NOTE — Telephone Encounter (Signed)
Called to speak to the daughter to tell her that we were swtiching the chemotherapy to R-CDOP.  She will get everything the same except switching the adriamycin for doxil.  This will be easier on her heart.  I have called in 2 nausea medications (take as needed for nausea), Prednisone (take days 1-5 of chemotherapy, start in the monring), Diflucan daily, emla cream, acyclovir (help prevent shingles, take two times a day, start this evening), and she should already be taking allopurinol. Daughter verblaized understanding.  Asked daughter about any prior chemo administration.  She states that her mother has not had any.  Her mother had radiation around 2000 for neck/tonsil.  In 95 she was hospitalized at [facility] and was finally diagnosed with double pneumonia.  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  She was admitted for awhile.

## 2017-07-03 NOTE — Sedation Documentation (Signed)
Dr. Barbie Banner made aware of afib noted on 3-lead ekg. Orders placed for 12 lead ekg. Obtaining at this time

## 2017-07-03 NOTE — Discharge Instructions (Signed)
You may take off dressing and bathe in 24 hours.    Bone Marrow Aspiration and Bone Marrow Biopsy, Adult, Care After This sheet gives you information about how to care for yourself after your procedure. Your health care provider may also give you more specific instructions. If you have problems or questions, contact your health care provider. What can I expect after the procedure? After the procedure, it is common to have:  Mild pain and tenderness.  Swelling.  Bruising.  Follow these instructions at home:  Take over-the-counter or prescription medicines only as told by your health care provider.  Do not take baths, swim, or use a hot tub until your health care provider approves. Ask if you can take a shower or have a sponge bath.  Follow instructions from your health care provider about how to take care of the puncture site. Make sure you: ? Wash your hands with soap and water before you change your bandage (dressing). If soap and water are not available, use hand sanitizer. ? Change your dressing as told by your health care provider.  Check your puncture siteevery day for signs of infection. Check for: ? More redness, swelling, or pain. ? More fluid or blood. ? Warmth. ? Pus or a bad smell.  Return to your normal activities as told by your health care provider. Ask your health care provider what activities are safe for you.  Do not drive for 24 hours if you were given a medicine to help you relax (sedative).  Keep all follow-up visits as told by your health care provider. This is important. Contact a health care provider if:  You have more redness, swelling, or pain around the puncture site.  You have more fluid or blood coming from the puncture site.  Your puncture site feels warm to the touch.  You have pus or a bad smell coming from the puncture site.  You have a fever.  Your pain is not controlled with medicine. This information is not intended to replace advice  given to you by your health care provider. Make sure you discuss any questions you have with your health care provider. Document Released: 11/16/2004 Document Revised: 11/17/2015 Document Reviewed: 10/11/2015 Elsevier Interactive Patient Education  2018 [place].     Bone Marrow Aspiration and Bone Marrow Biopsy, Adult, Care After This sheet gives you information about how to care for yourself after your procedure. Your health care provider may also give you more specific instructions. If you have problems or questions, contact your health care provider. What can I expect after the procedure? After the procedure, it is common to have:  Mild pain and tenderness.  Swelling.  Bruising.  Follow these instructions at home:  Take over-the-counter or prescription medicines only as told by your health care provider.  Do not take baths, swim, or use a hot tub until your health care provider approves. Ask if you can take a shower or have a sponge bath.  Follow instructions from your health care provider about how to take care of the puncture site. Make sure you: ? Wash your hands with soap and water before you change your bandage (dressing). If soap and water are not available, use hand sanitizer. ? Change your dressing as told by your health care provider.  Check your puncture siteevery day for signs of infection. Check for: ? More redness, swelling, or pain. ? More fluid or blood. ? Warmth. ? Pus or a bad smell.  Return to your  normal activities as told by your health care provider. Ask your health care provider what activities are safe for you.  Do not drive for 24 hours if you were given a medicine to help you relax (sedative).  Keep all follow-up visits as told by your health care provider. This is important. Contact a health care provider if:  You have more redness, swelling, or pain around the puncture site.  You have more fluid or blood coming from the puncture  site.  Your puncture site feels warm to the touch.  You have pus or a bad smell coming from the puncture site.  You have a fever.  Your pain is not controlled with medicine. This information is not intended to replace advice given to you by your health care provider. Make sure you discuss any questions you have with your health care provider. Document Released: 11/16/2004 Document Revised: 11/17/2015 Document Reviewed: 10/11/2015 Elsevier Interactive Patient Education  2018 [place].  Moderate Conscious Sedation, Adult, Care After These instructions provide you with information about caring for yourself after your procedure. Your health care provider may also give you more specific instructions. Your treatment has been planned according to current medical practices, but problems sometimes occur. Call your health care provider if you have any problems or questions after your procedure. What can I expect after the procedure? After your procedure, it is common:  To feel sleepy for several hours.  To feel clumsy and have poor balance for several hours.  To have poor judgment for several hours.  To vomit if you eat too soon.  Follow these instructions at home: For at least 24 hours after the procedure:   Do not: ? Participate in activities where you could fall or become injured. ? Drive. ? Use heavy machinery. ? Drink alcohol. ? Take sleeping pills or medicines that cause drowsiness. ? Make important decisions or sign legal documents. ? Take care of children on your own.  Rest. Eating and drinking  Follow the diet recommended by your health care provider.  If you vomit: ? Drink water, juice, or soup when you can drink without vomiting. ? Make sure you have little or no nausea before eating solid foods. General instructions  Have a responsible adult stay with you until you are awake and alert.  Take over-the-counter and prescription medicines only as told by your  health care provider.  If you smoke, do not smoke without supervision.  Keep all follow-up visits as told by your health care provider. This is important. Contact a health care provider if:  You keep feeling nauseous or you keep vomiting.  You feel light-headed.  You develop a rash.  You have a fever. Get help right away if:  You have trouble breathing. This information is not intended to replace advice given to you by your health care provider. Make sure you discuss any questions you have with your health care provider. Document Released: 02/17/2013 Document Revised: 10/02/2015 Document Reviewed: 08/19/2015 Elsevier Interactive Patient Education  [employer].

## 2017-07-03 NOTE — Procedures (Signed)
BM Bx and aspirate EBL 0 Comp 0 

## 2017-07-03 NOTE — Sedation Documentation (Signed)
Per EKG, pt is in SA with PACs. Dr. Barbie Banner visualized EKG. Okay to proceed with procedure

## 2017-07-04 ENCOUNTER — Encounter (HOSPITAL_COMMUNITY): Payer: Self-pay

## 2017-07-04 ENCOUNTER — Inpatient Hospital Stay (HOSPITAL_COMMUNITY): Payer: Medicare Other

## 2017-07-04 ENCOUNTER — Inpatient Hospital Stay (HOSPITAL_COMMUNITY): Payer: Medicare Other | Attending: Internal Medicine

## 2017-07-04 VITALS — BP 154/68 | HR 61 | Temp 97.6°F | Resp 18 | Wt 113.2 lb

## 2017-07-04 DIAGNOSIS — Z5112 Encounter for antineoplastic immunotherapy: Secondary | ICD-10-CM | POA: Diagnosis not present

## 2017-07-04 DIAGNOSIS — C8338 Diffuse large B-cell lymphoma, lymph nodes of multiple sites: Secondary | ICD-10-CM

## 2017-07-04 LAB — COMPREHENSIVE METABOLIC PANEL
ALBUMIN: 2.9 g/dL — AB (ref 3.5–5.0)
ALT: 22 U/L (ref 14–54)
ANION GAP: 11 (ref 5–15)
AST: 48 U/L — ABNORMAL HIGH (ref 15–41)
Alkaline Phosphatase: 133 U/L — ABNORMAL HIGH (ref 38–126)
BUN: 39 mg/dL — ABNORMAL HIGH (ref 6–20)
CALCIUM: 10.8 mg/dL — AB (ref 8.9–10.3)
CHLORIDE: 102 mmol/L (ref 101–111)
CO2: 22 mmol/L (ref 22–32)
Creatinine, Ser: 3.12 mg/dL — ABNORMAL HIGH (ref 0.44–1.00)
GFR calc non Af Amer: 14 mL/min — ABNORMAL LOW (ref >60)
GFR, EST AFRICAN AMERICAN: 16 mL/min — AB (ref >60)
GLUCOSE: 127 mg/dL — AB (ref 65–99)
Potassium: 3.1 mmol/L — ABNORMAL LOW (ref 3.5–5.1)
SODIUM: 135 mmol/L (ref 135–145)
Total Bilirubin: 0.9 mg/dL (ref 0.3–1.2)
Total Protein: 6.8 g/dL (ref 6.5–8.1)

## 2017-07-04 LAB — CBC WITH DIFFERENTIAL/PLATELET
BASOS PCT: 0 %
Basophils Absolute: 0 10*3/uL (ref 0.0–0.1)
EOS ABS: 0.1 10*3/uL (ref 0.0–0.7)
EOS PCT: 1 %
HCT: 31.6 % — ABNORMAL LOW (ref 36.0–46.0)
Hemoglobin: 9.8 g/dL — ABNORMAL LOW (ref 12.0–15.0)
Lymphocytes Relative: 15 %
Lymphs Abs: 1.2 10*3/uL (ref 0.7–4.0)
MCH: 29.4 pg (ref 26.0–34.0)
MCHC: 31 g/dL (ref 30.0–36.0)
MCV: 94.9 fL (ref 78.0–100.0)
MONO ABS: 0.6 10*3/uL (ref 0.1–1.0)
MONOS PCT: 8 %
Neutro Abs: 5.8 10*3/uL (ref 1.7–7.7)
Neutrophils Relative %: 76 %
Platelets: 77 10*3/uL — ABNORMAL LOW (ref 150–400)
RBC: 3.33 MIL/uL — ABNORMAL LOW (ref 3.87–5.11)
RDW: 17.2 % — AB (ref 11.5–15.5)
WBC: 7.7 10*3/uL (ref 4.0–10.5)

## 2017-07-04 LAB — PHOSPHORUS: PHOSPHORUS: 3.6 mg/dL (ref 2.5–4.6)

## 2017-07-04 LAB — LACTATE DEHYDROGENASE: LDH: 501 U/L — ABNORMAL HIGH (ref 98–192)

## 2017-07-04 LAB — URIC ACID: URIC ACID, SERUM: 7.2 mg/dL — AB (ref 2.3–6.6)

## 2017-07-04 MED ORDER — SODIUM CHLORIDE 0.9 % IV SOLN
375.0000 mg/m2 | Freq: Once | INTRAVENOUS | Status: AC
  Administered 2017-07-04: 600 mg via INTRAVENOUS
  Filled 2017-07-04: qty 10

## 2017-07-04 MED ORDER — DOXORUBICIN HCL LIPOSOMAL CHEMO INJECTION 2 MG/ML
32.0000 mg/m2 | Freq: Once | INTRAVENOUS | Status: AC
  Administered 2017-07-04: 50 mg via INTRAVENOUS
  Filled 2017-07-04: qty 25

## 2017-07-04 MED ORDER — SODIUM CHLORIDE 0.9 % IV SOLN
6.0000 mg | Freq: Once | INTRAVENOUS | Status: AC
  Administered 2017-07-04: 6 mg via INTRAVENOUS
  Filled 2017-07-04: qty 4

## 2017-07-04 MED ORDER — DENOSUMAB 120 MG/1.7ML ~~LOC~~ SOLN
120.0000 mg | Freq: Once | SUBCUTANEOUS | Status: AC
Start: 2017-07-04 — End: 2017-07-04
  Administered 2017-07-04: 120 mg via SUBCUTANEOUS
  Filled 2017-07-04: qty 1.7

## 2017-07-04 MED ORDER — VINCRISTINE SULFATE CHEMO INJECTION 1 MG/ML
2.0000 mg | Freq: Once | INTRAVENOUS | Status: AC
  Administered 2017-07-04: 2 mg via INTRAVENOUS
  Filled 2017-07-04: qty 2

## 2017-07-04 MED ORDER — SODIUM CHLORIDE 0.9% FLUSH
3.0000 mL | INTRAVENOUS | Status: DC | PRN
  Administered 2017-07-04: 3 mL
  Filled 2017-07-04: qty 10

## 2017-07-04 MED ORDER — DIPHENHYDRAMINE HCL 25 MG PO CAPS
50.0000 mg | ORAL_CAPSULE | Freq: Once | ORAL | Status: AC
  Administered 2017-07-04: 50 mg via ORAL
  Filled 2017-07-04: qty 2

## 2017-07-04 MED ORDER — SODIUM CHLORIDE 0.9 % IV SOLN
Freq: Once | INTRAVENOUS | Status: AC
  Administered 2017-07-04: 11:00:00 via INTRAVENOUS

## 2017-07-04 MED ORDER — PALONOSETRON HCL INJECTION 0.25 MG/5ML
0.2500 mg | Freq: Once | INTRAVENOUS | Status: AC
  Administered 2017-07-04: 0.25 mg via INTRAVENOUS

## 2017-07-04 MED ORDER — HEPARIN SOD (PORK) LOCK FLUSH 100 UNIT/ML IV SOLN
250.0000 [IU] | Freq: Once | INTRAVENOUS | Status: AC | PRN
  Administered 2017-07-04: 250 [IU]
  Filled 2017-07-04 (×2): qty 5

## 2017-07-04 MED ORDER — FUROSEMIDE 10 MG/ML IJ SOLN
20.0000 mg | Freq: Once | INTRAMUSCULAR | Status: AC
  Administered 2017-07-04: 20 mg via INTRAVENOUS
  Filled 2017-07-04: qty 2

## 2017-07-04 MED ORDER — DEXAMETHASONE SODIUM PHOSPHATE 10 MG/ML IJ SOLN
10.0000 mg | Freq: Once | INTRAMUSCULAR | Status: AC
  Administered 2017-07-04: 10 mg via INTRAVENOUS
  Filled 2017-07-04: qty 1

## 2017-07-04 MED ORDER — SODIUM CHLORIDE 0.9 % IV SOLN
750.0000 mg/m2 | Freq: Once | INTRAVENOUS | Status: AC
  Administered 2017-07-04: 1180 mg via INTRAVENOUS
  Filled 2017-07-04: qty 14

## 2017-07-04 MED ORDER — SODIUM CHLORIDE 0.9 % IV SOLN: 10.0000 mg | Freq: Once | INTRAVENOUS | Status: DC

## 2017-07-04 MED ORDER — PALONOSETRON HCL INJECTION 0.25 MG/5ML
INTRAVENOUS | Status: AC
  Filled 2017-07-04: qty 5

## 2017-07-04 MED ORDER — ACETAMINOPHEN 325 MG PO TABS
650.0000 mg | ORAL_TABLET | Freq: Once | ORAL | Status: AC
  Administered 2017-07-04: 650 mg via ORAL
  Filled 2017-07-04: qty 2

## 2017-07-04 NOTE — Progress Notes (Signed)
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  Past medical history of CAD, cardia myopathy, CKD stage III, COPD, tobacco abuse, sjorgen's syndrome.  Met with patient during first infusion this am.  Patient anxious to get started on chemotherapy.  Patient reports appetite is not that good and has been like this for "awhile".  Lives with grand-daughter and her husband.  Patient reports drinks ensure original 2 times per day (receives it free from [employer] [facility]).  Patient reports usually eats oatmeal for breakfast, pudding for lunch and ate mashed potatoes with biscuit and gravy for supper.  Reports that grand-daughter also prepares her homemade milkshake daily.  Patient does not like eggs.  Patient does not eat much meat as can't chew it well and reports she has gotten choked on it before.    Son came in during visit but no other family present during interview.    Patient reports no problems with constipation or diarrhea.  Usually has bowel movement daily.  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   MALNUTRITION DIAGNOSIS: patient meets criteria for severe malnutrition in context of acute illness but likely progressing to chronic illness as evidenced by 10% weight loss in the last 22 days and eating < or equal to  50% of energy needs for > or equal to 5 days.   INTERVENTION:   Discussed importance of good nutrition during treatment.   Discussed ways to prepare soft moist protein foods.  Handout given with examples. If aspiration is concern patient could benefit from evaluation for SLP Discussed ways to increase calories and protein.  Fact sheet given as well. Also provided fact sheet on nausea and vomiting.   Provided samples of ensure enlive and boost plus for patient to try. Encouraged patient to continue ensure shake at least 3 times per day    MONITORING, EVALUATION, GOAL: Patient will consume adequate calories and protein to maintain weight   NEXT VISIT: April 5th  Ikey Omary B. Zenia Resides, [place], [place] Registered Dietitian [phone] (pager)

## 2017-07-04 NOTE — Progress Notes (Signed)
35 All lab results reviewed with Dr. Walden Field and pt approved for chemo tx today per MD. Damaris Schooner with pt's grand-daughter who confirmed that the pt took her Prednisone as prescribed this am.                                [name] tolerated chemo tx with Delton See injection well without complaints or incident. VSS upon discharge. PICC line flushed per protocol with dressing dry and intact. Pt discharged via wheelchair in stable condition accompanied by family member

## 2017-07-07 ENCOUNTER — Telehealth (HOSPITAL_COMMUNITY): Payer: Self-pay | Admitting: Emergency Medicine

## 2017-07-07 ENCOUNTER — Encounter (HOSPITAL_COMMUNITY): Payer: Medicare Other

## 2017-07-07 ENCOUNTER — Telehealth (HOSPITAL_COMMUNITY): Payer: Self-pay

## 2017-07-07 NOTE — Telephone Encounter (Signed)
F/U Chemo call. Spoke with pt's grand daughter and pt was admitted to [facility] over the weekend for dehydration and pneumonia. She reported that the doctor at [facility] spoke with someone here regarding today's appt.

## 2017-07-07 NOTE — Telephone Encounter (Signed)
Dr Ronne Binning from [facility] called to update me on the pt.  She was admitted the day after she had chemo for severe weakness.  Possibly pneumonia.  They are treating her for pneumonia and dehydration.  PT has evaluated her and she is severely debilitated and can only walk about 10 feet.  He wanted to know the next appt with [place] and according to her schedule it is Friday to see the doctor and to have a picc line flush.  He wanted to know when the next chemotherapy would be given.  I said they are given every 3 weeks and we would prefer for these not to be held if at all possible.  Dr Bradley Ferris is trying to get the pt set up for PT for the next 1-2 weeks.  I have requested the records [facility] [place] have thus far for her stay.  He is trying to arrange placement at [facility] for rehab.  Dr Ronne Binning and [facility] have my number to get in touch for any further information.

## 2017-07-08 ENCOUNTER — Telehealth (HOSPITAL_COMMUNITY): Payer: Self-pay

## 2017-07-08 ENCOUNTER — Encounter (HOSPITAL_COMMUNITY): Payer: Medicare Other

## 2017-07-08 NOTE — Telephone Encounter (Signed)
Dr. Walden Field was made aware yesterday of pt's admission to [facility] this past Saturday for weakness,dehydration and possible pneumonia per the grand-daughter and that she did miss her appt yesterday for the [facility]. Pt remains in the hospital at this time

## 2017-07-09 ENCOUNTER — Other Ambulatory Visit: Payer: Self-pay | Admitting: Pediatrics

## 2017-07-10 ENCOUNTER — Encounter (HOSPITAL_COMMUNITY): Payer: Medicare Other

## 2017-07-10 ENCOUNTER — Telehealth (HOSPITAL_COMMUNITY): Payer: Self-pay

## 2017-07-10 NOTE — Telephone Encounter (Addendum)
Called and spoke with Jesus Genera, from the [facility].    The patient received Neupogen 300 mcg on February 25th and July 08, 2017.  Requested lab work to be drawn next week, CBCdiff and BMET, and to be faxed to the [facility] and number given to [place].    Spoke with the daughter and informed of plan for her mother and the patient does not need to come to tomorrows appointment. The daughter was concerned about the patient staying on schedule with her chemotherapy.   Instructed the daughter we need to take it one week at a time with the patient being admitted for bilateral pneumonia and in rehab for 3 weeks.  Verbalized understanding.

## 2017-07-11 ENCOUNTER — Ambulatory Visit (HOSPITAL_COMMUNITY): Payer: Medicare Other | Admitting: Internal Medicine

## 2017-07-11 ENCOUNTER — Other Ambulatory Visit (HOSPITAL_COMMUNITY): Payer: Medicare Other

## 2017-07-14 ENCOUNTER — Encounter (HOSPITAL_COMMUNITY): Payer: Medicare Other

## 2017-07-15 ENCOUNTER — Encounter (HOSPITAL_COMMUNITY): Payer: Medicare Other

## 2017-07-15 ENCOUNTER — Other Ambulatory Visit (HOSPITAL_COMMUNITY): Payer: Self-pay | Admitting: *Deleted

## 2017-07-15 ENCOUNTER — Telehealth (HOSPITAL_COMMUNITY): Payer: Self-pay

## 2017-07-15 DIAGNOSIS — C8338 Diffuse large B-cell lymphoma, lymph nodes of multiple sites: Secondary | ICD-10-CM

## 2017-07-15 DIAGNOSIS — D702 Other drug-induced agranulocytosis: Secondary | ICD-10-CM

## 2017-07-15 NOTE — Telephone Encounter (Signed)
Received lab work from [employer] on patient and reviewed with Dr. Walden Field. Per Dr. Walden Field patient needs to come to [facility] tomorrow (07/16/17)  for 1 unit of blood, 1 unit of platelets and 480 mcg of neupogen. Called and spoke with patients daughter, Tiffany [name] at [phone]. Explained to daughter that patient needs to be at the cancer center at [place] tomorrow and she will have to have her labs redrawn also because [facility] will not use an outside facilities lab results. She should plan on being here most of the day because patient will get blood products after labs are resulted. Daughter verbalized understanding. Also called and spoke with patients nurse at [facility] rehab, [name]. Notified her of the above plan with understanding verbalized.

## 2017-07-16 ENCOUNTER — Telehealth (HOSPITAL_COMMUNITY): Payer: Self-pay

## 2017-07-16 ENCOUNTER — Inpatient Hospital Stay (HOSPITAL_COMMUNITY): Payer: Medicare Other | Attending: Internal Medicine

## 2017-07-16 ENCOUNTER — Encounter (HOSPITAL_COMMUNITY): Payer: Self-pay

## 2017-07-16 DIAGNOSIS — D6481 Anemia due to antineoplastic chemotherapy: Secondary | ICD-10-CM | POA: Diagnosis not present

## 2017-07-16 DIAGNOSIS — R634 Abnormal weight loss: Secondary | ICD-10-CM | POA: Insufficient documentation

## 2017-07-16 DIAGNOSIS — Z5111 Encounter for antineoplastic chemotherapy: Secondary | ICD-10-CM | POA: Diagnosis present

## 2017-07-16 DIAGNOSIS — Z87891 Personal history of nicotine dependence: Secondary | ICD-10-CM | POA: Insufficient documentation

## 2017-07-16 DIAGNOSIS — D701 Agranulocytosis secondary to cancer chemotherapy: Secondary | ICD-10-CM | POA: Diagnosis not present

## 2017-07-16 DIAGNOSIS — D702 Other drug-induced agranulocytosis: Secondary | ICD-10-CM

## 2017-07-16 DIAGNOSIS — C8338 Diffuse large B-cell lymphoma, lymph nodes of multiple sites: Secondary | ICD-10-CM | POA: Diagnosis present

## 2017-07-16 DIAGNOSIS — D696 Thrombocytopenia, unspecified: Secondary | ICD-10-CM | POA: Insufficient documentation

## 2017-07-16 DIAGNOSIS — Z5112 Encounter for antineoplastic immunotherapy: Secondary | ICD-10-CM | POA: Diagnosis present

## 2017-07-16 LAB — CBC WITH DIFFERENTIAL/PLATELET
Basophils Absolute: 0 10*3/uL (ref 0.0–0.1)
Basophils Relative: 0 %
Eosinophils Absolute: 0 10*3/uL (ref 0.0–0.7)
Eosinophils Relative: 0 %
HCT: 22.4 % — ABNORMAL LOW (ref 36.0–46.0)
Hemoglobin: 7.1 g/dL — ABNORMAL LOW (ref 12.0–15.0)
LYMPHS ABS: 0.2 10*3/uL — AB (ref 0.7–4.0)
Lymphocytes Relative: 58 %
MCH: 29 pg (ref 26.0–34.0)
MCHC: 31.7 g/dL (ref 30.0–36.0)
MCV: 91.4 fL (ref 78.0–100.0)
MONO ABS: 0.1 10*3/uL (ref 0.1–1.0)
MONOS PCT: 15 %
NEUTROS ABS: 0.1 10*3/uL — AB (ref 1.7–7.7)
Neutrophils Relative %: 27 %
PLATELETS: 20 10*3/uL — AB (ref 150–400)
RBC: 2.45 MIL/uL — ABNORMAL LOW (ref 3.87–5.11)
RDW: 17.1 % — AB (ref 11.5–15.5)
WBC: 0.4 10*3/uL — CL (ref 4.0–10.5)

## 2017-07-16 LAB — SAMPLE TO BLOOD BANK

## 2017-07-16 LAB — BASIC METABOLIC PANEL
ANION GAP: 14 (ref 5–15)
BUN: 34 mg/dL — ABNORMAL HIGH (ref 6–20)
CALCIUM: 7 mg/dL — AB (ref 8.9–10.3)
CO2: 14 mmol/L — ABNORMAL LOW (ref 22–32)
Chloride: 106 mmol/L (ref 101–111)
Creatinine, Ser: 2.89 mg/dL — ABNORMAL HIGH (ref 0.44–1.00)
GFR calc Af Amer: 17 mL/min — ABNORMAL LOW (ref >60)
GFR, EST NON AFRICAN AMERICAN: 15 mL/min — AB (ref >60)
GLUCOSE: 84 mg/dL (ref 65–99)
Potassium: 3.7 mmol/L (ref 3.5–5.1)
Sodium: 134 mmol/L — ABNORMAL LOW (ref 135–145)

## 2017-07-16 LAB — PREPARE RBC (CROSSMATCH)

## 2017-07-16 MED ORDER — SODIUM CHLORIDE 0.9% FLUSH
10.0000 mL | INTRAVENOUS | Status: DC | PRN
  Administered 2017-07-16: 10 mL via INTRAVENOUS
  Filled 2017-07-16: qty 10

## 2017-07-16 MED ORDER — ACETAMINOPHEN 325 MG PO TABS
650.0000 mg | ORAL_TABLET | Freq: Once | ORAL | Status: AC
  Administered 2017-07-16: 650 mg via ORAL
  Filled 2017-07-16: qty 2

## 2017-07-16 MED ORDER — PROCHLORPERAZINE MALEATE 10 MG PO TABS
ORAL_TABLET | ORAL | Status: AC
  Filled 2017-07-16: qty 1

## 2017-07-16 MED ORDER — DIPHENHYDRAMINE HCL 25 MG PO CAPS
25.0000 mg | ORAL_CAPSULE | Freq: Once | ORAL | Status: AC
  Administered 2017-07-16: 25 mg via ORAL
  Filled 2017-07-16: qty 1

## 2017-07-16 MED ORDER — ACETAMINOPHEN 325 MG PO TABS
ORAL_TABLET | ORAL | Status: AC
  Filled 2017-07-16: qty 2

## 2017-07-16 MED ORDER — SODIUM CHLORIDE 0.9 % IV SOLN
250.0000 mL | Freq: Once | INTRAVENOUS | Status: AC
  Administered 2017-07-16: 250 mL via INTRAVENOUS

## 2017-07-16 MED ORDER — SODIUM CHLORIDE 0.9% FLUSH
3.0000 mL | INTRAVENOUS | Status: AC | PRN
  Administered 2017-07-16: 3 mL

## 2017-07-16 MED ORDER — DIPHENHYDRAMINE HCL 25 MG PO CAPS
ORAL_CAPSULE | ORAL | Status: AC
  Filled 2017-07-16: qty 1

## 2017-07-16 MED ORDER — FILGRASTIM 480 MCG/0.8ML IJ SOSY
480.0000 ug | PREFILLED_SYRINGE | Freq: Once | INTRAMUSCULAR | Status: AC
  Administered 2017-07-16: 480 ug via SUBCUTANEOUS
  Filled 2017-07-16: qty 0.8

## 2017-07-16 MED ORDER — HEPARIN SOD (PORK) LOCK FLUSH 100 UNIT/ML IV SOLN
250.0000 [IU] | INTRAVENOUS | Status: DC | PRN
  Filled 2017-07-16: qty 5

## 2017-07-16 NOTE — Telephone Encounter (Signed)
Created in error

## 2017-07-16 NOTE — Telephone Encounter (Deleted)
Correction:  Neupogen given February 25 and 26, 2019.

## 2017-07-16 NOTE — Progress Notes (Signed)
Tiffany [name] tolerated platelet transfusion and Neupogen injection well without complaints or incident. Labs reviewed with Dr. Walden Field today and orders obtained for 1 unit of PRBC's and 1 unit of Platelets. Blood transfusion will be done tomorrow due to antibody. PICC line dressing changed per protocol with cap changed and line flushed. Bruising continues around insertion site but flushes easily with good blood return noted. VSS upon discharge. Pt discharged via wheelchair in stable condition accompanied by family members

## 2017-07-16 NOTE — Patient Instructions (Signed)
[place] at [facility] Discharge Instructions  Received 1 unit of platelets and Neupogen injection today. PICC line dressing changed as well. Follow-up as scheduled. Call clinic for any questions or concerns   Thank you for choosing [place] at [facility] to provide your oncology and hematology care.  To afford each patient quality time with our provider, please arrive at least 15 minutes before your scheduled appointment time.   If you have a lab appointment with the [facility] please come in thru the  Main Entrance and check in at the main information desk  You need to re-schedule your appointment should you arrive 10 or more minutes late.  We strive to give you quality time with our providers, and arriving late affects you and other patients whose appointments are after yours.  Also, if you no show three or more times for appointments you may be dismissed from the clinic at the providers discretion.     Again, thank you for choosing [facility].  Our hope is that these requests will decrease the amount of time that you wait before being seen by our physicians.       _____________________________________________________________  Should you have questions after your visit to [facility], please contact our office at [phone] between the hours of 8:30 a.m. and 4:30 p.m.  Voicemails left after 4:30 p.m. will not be returned until the following business day.  For prescription refill requests, have your pharmacy contact our office.       Resources For Cancer Patients and their Caregivers ? American Cancer Society: Can assist with transportation, wigs, general needs, runs Look Good Feel Better.        8482372696 ? Cancer Care: Provides financial assistance, online support groups, medication/co-pay assistance.  1-800-813-HOPE 581-073-5993) ? [place] Assists [place] cancer  patients and their families through emotional , educational and financial support.  (814)182-6179 ? [place] DSS Where to apply for food stamps, Medicaid and utility assistance. 319-397-4937 ? [employer]: Transportation to medical appointments. 808-165-0419 ? Social Security Administration: May apply for disability if have a Stage IV cancer. 726-502-9209 539 599 4319 ? [employer], Disability and Transit Services: Assists with nutrition, care and transit needs. [place] Support Programs:   > Cancer Support Group  2nd Tuesday of the month 1pm-2pm, Journey Room   > Creative Journey  3rd Tuesday of the month 1130am-1pm, Journey Room

## 2017-07-16 NOTE — Progress Notes (Signed)
CRITICAL VALUE ALERT  Critical Value:  WBC 400; platelets 20,000  Date & Time Notied:  07/16/17 at 0906  Provider Notified: Dr. Walden Field  Orders Received/Actions taken: orders rec'd for Neupogen, transfuse 1 unit platelets and one unit PRBC

## 2017-07-17 ENCOUNTER — Ambulatory Visit: Payer: Medicare Other | Admitting: General Surgery

## 2017-07-17 ENCOUNTER — Inpatient Hospital Stay (HOSPITAL_BASED_OUTPATIENT_CLINIC_OR_DEPARTMENT_OTHER): Payer: Medicare Other | Admitting: Internal Medicine

## 2017-07-17 ENCOUNTER — Ambulatory Visit (HOSPITAL_COMMUNITY): Payer: Medicare Other

## 2017-07-17 ENCOUNTER — Ambulatory Visit (HOSPITAL_COMMUNITY)
Admission: RE | Admit: 2017-07-17 | Discharge: 2017-07-17 | Disposition: A | Discharge status: Home / Self Care | Payer: Medicare Other | Source: Ambulatory Visit | Attending: Internal Medicine | Admitting: Internal Medicine

## 2017-07-17 ENCOUNTER — Encounter (HOSPITAL_COMMUNITY): Payer: Medicare Other

## 2017-07-17 ENCOUNTER — Encounter (HOSPITAL_COMMUNITY): Payer: Self-pay | Admitting: Internal Medicine

## 2017-07-17 ENCOUNTER — Other Ambulatory Visit (HOSPITAL_COMMUNITY): Payer: Self-pay | Admitting: Internal Medicine

## 2017-07-17 ENCOUNTER — Inpatient Hospital Stay (HOSPITAL_COMMUNITY): Payer: Medicare Other

## 2017-07-17 VITALS — BP 92/42 | HR 86 | Temp 97.4°F | Resp 16

## 2017-07-17 DIAGNOSIS — D696 Thrombocytopenia, unspecified: Secondary | ICD-10-CM | POA: Diagnosis not present

## 2017-07-17 DIAGNOSIS — C8338 Diffuse large B-cell lymphoma, lymph nodes of multiple sites: Secondary | ICD-10-CM

## 2017-07-17 DIAGNOSIS — Z87891 Personal history of nicotine dependence: Secondary | ICD-10-CM

## 2017-07-17 DIAGNOSIS — R918 Other nonspecific abnormal finding of lung field: Secondary | ICD-10-CM | POA: Insufficient documentation

## 2017-07-17 DIAGNOSIS — D701 Agranulocytosis secondary to cancer chemotherapy: Secondary | ICD-10-CM

## 2017-07-17 DIAGNOSIS — D6481 Anemia due to antineoplastic chemotherapy: Secondary | ICD-10-CM

## 2017-07-17 DIAGNOSIS — D702 Other drug-induced agranulocytosis: Secondary | ICD-10-CM

## 2017-07-17 DIAGNOSIS — R634 Abnormal weight loss: Secondary | ICD-10-CM

## 2017-07-17 LAB — BPAM PLATELET PHERESIS
Blood Product Expiration Date: 201903062359
ISSUE DATE / TIME: 201903061158
Unit Type and Rh: 6200

## 2017-07-17 LAB — PREPARE PLATELET PHERESIS: Unit division: 0

## 2017-07-17 MED ORDER — FILGRASTIM 480 MCG/0.8ML IJ SOSY
480.0000 ug | PREFILLED_SYRINGE | Freq: Once | INTRAMUSCULAR | Status: AC
  Administered 2017-07-17: 480 ug via SUBCUTANEOUS
  Filled 2017-07-17: qty 0.8

## 2017-07-17 MED ORDER — SODIUM CHLORIDE 0.9 % IV SOLN
250.0000 mL | Freq: Once | INTRAVENOUS | Status: AC
  Administered 2017-07-17: 250 mL via INTRAVENOUS

## 2017-07-17 MED ORDER — HEPARIN SOD (PORK) LOCK FLUSH 100 UNIT/ML IV SOLN: 250.0000 [IU] | INTRAVENOUS | Status: DC | PRN

## 2017-07-17 MED ORDER — DIPHENHYDRAMINE HCL 25 MG PO CAPS
25.0000 mg | ORAL_CAPSULE | Freq: Once | ORAL | Status: AC
  Administered 2017-07-17: 25 mg via ORAL
  Filled 2017-07-17: qty 1

## 2017-07-17 MED ORDER — SODIUM CHLORIDE 0.9% FLUSH
10.0000 mL | INTRAVENOUS | Status: AC | PRN
  Administered 2017-07-17: 10 mL

## 2017-07-17 MED ORDER — ACETAMINOPHEN 325 MG PO TABS
ORAL_TABLET | ORAL | Status: AC
  Filled 2017-07-17: qty 2

## 2017-07-17 MED ORDER — SODIUM CHLORIDE 0.9 % IV SOLN
1.0000 g | Freq: Once | INTRAVENOUS | Status: AC
  Administered 2017-07-17: 1 g via INTRAVENOUS
  Filled 2017-07-17: qty 10

## 2017-07-17 MED ORDER — DIPHENHYDRAMINE HCL 25 MG PO CAPS
ORAL_CAPSULE | ORAL | Status: AC
  Filled 2017-07-17: qty 1

## 2017-07-17 MED ORDER — SODIUM CHLORIDE 0.9 % IV SOLN
1.0000 g | INTRAVENOUS | Status: DC
  Filled 2017-07-17: qty 10

## 2017-07-17 MED ORDER — ACETAMINOPHEN 325 MG PO TABS
650.0000 mg | ORAL_TABLET | Freq: Once | ORAL | Status: AC
  Administered 2017-07-17: 650 mg via ORAL

## 2017-07-17 NOTE — Progress Notes (Signed)
Patient presented today with a temperature of 99.8. At 1100 it was 100.8 tempanic, MD made aware. Proceed with one unit of blood.  One unit of blood given today. PCXR done-will give one dose of antibiotics as ordered.  Hospice referral done today. Spoke with Maudie Mercury the Nurse at [facility] rehab facility, she is aware of patients decision and family decision to move on with Hospice.     Vitals stable and discharged home from clinic via wheelchair.

## 2017-07-17 NOTE — Patient Instructions (Addendum)
[place] at [facility] Discharge Instructions   You were seen today by Dr. Zoila Shutter. She discussed your lab work and how your numbers are better and  we will recheck your lab work next week. Dr. Walden Field also discussed your treatment and how you may not be able to take your treatment next week unless your labs are better.  We are going to get a chest xray today. Dr. Walden Field also discussed a DNR and living will with you and your family. We are going to refer you to Hospice.    Thank you for choosing [place] at [facility] to provide your oncology and hematology care.  To afford each patient quality time with our provider, please arrive at least 15 minutes before your scheduled appointment time.    If you have a lab appointment with the [facility] please come in thru the  Main Entrance and check in at the main information desk  You need to re-schedule your appointment should you arrive 10 or more minutes late.  We strive to give you quality time with our providers, and arriving late affects you and other patients whose appointments are after yours.  Also, if you no show three or more times for appointments you may be dismissed from the clinic at the providers discretion.     Again, thank you for choosing [facility].  Our hope is that these requests will decrease the amount of time that you wait before being seen by our physicians.       _____________________________________________________________  Should you have questions after your visit to [facility], please contact our office at [phone] between the hours of 8:30 a.m. and 4:30 p.m.  Voicemails left after 4:30 p.m. will not be returned until the following business day.  For prescription refill requests, have your pharmacy contact our office.       Resources For Cancer Patients and their Caregivers ? American Cancer Society: Can assist with  transportation, wigs, general needs, runs Look Good Feel Better.        (772)660-8870 ? Cancer Care: Provides financial assistance, online support groups, medication/co-pay assistance.  1-800-813-HOPE 626 322 3689) ? [place] Assists [place] cancer patients and their families through emotional , educational and financial support.  470 509 4038 ? [place] DSS Where to apply for food stamps, Medicaid and utility assistance. 417-362-3634 ? [employer]: Transportation to medical appointments. 4035029192 ? Social Security Administration: May apply for disability if have a Stage IV cancer. 734-102-4957 (585) 229-3022 ? [employer], Disability and Transit Services: Assists with nutrition, care and transit needs. [place] Support Programs:   > Cancer Support Group  2nd Tuesday of the month 1pm-2pm, Journey Room   > Creative Journey  3rd Tuesday of the month 1130am-1pm, Journey Room

## 2017-07-17 NOTE — Progress Notes (Signed)
Diagnosis Diffuse large B-cell lymphoma of lymph nodes of multiple regions ([place]) - Plan: CANCELED: DG Chest Portable 2 Views (NEONATE)  Staging Cancer Staging No matching staging information was found for the patient.  Assessment and Plan:  1. DLBCL Stage IV  77 year old female with a history of coronary artery disease, cardiomyopathy, CKD stage III, COPD, tobacco abuse, orthostatic hypotension, SLE/Sjogren's, and remote history of lymphoma presenting with epigastric pain and rib pain. The patient had a choking episode resulting in a Heimlich maneuver performed on her on 05/26/2017. Since that period of time, the patient has been complaining of "rib pain", epigastric pain, and right upper quadrant pain without exacerbating or alleviating factors. She denies any fevers, chills, nausea, vomiting, diarrhea, dysuria, hematuria. CT of the chest, abdomen, and pelvis revealed multiple abnormalities. It showed LULbronchiectasis. There is also bronchiectasis of the bilateral lower lobes with mucous plugging. There are scattered groundglass densities in the LLL and RML.There was also a new hypodense liver masses and aLUQmasslike process between the spleen and left upper kidney.  The MRI of the abdomen and pelvis suggested numerous liver masses with a left retroperitoneal mass at the pancreatic tail.  IR was consulted for liver biopsy planned for 06/03/2017.  On 06/03/2017, IR did not feel comfortable performing liver biospy due to patient's drop in Hgb. Pt was subsequently set up for liver biopsy that was done on 06/13/2017 with pathology returning as DLBCL.  She has also undergone ECHO on 06/01/2017 that shows EF 55%.   Pt reports some fatigue and weight loss.  Pt is seen today for consultation due to DLBCL.   Pt has evidence of advanced diseased based on adenopathy, liver involvement noted on PET done 06/26/2017 which showed  IMPRESSION: 1. Hypermetabolic disease in the right neck, involving lymph  nodes of the axillary regions, right thoracic inlet, mediastinum, and both hila. There is a markedly hypermetabolic right middle lobe nodule associated. 2. In the abdomen and pelvis, the pancreatic tail lesion is markedly hypermetabolic with hypermetabolic liver metastases, hepato duodenal ligament lymphadenopathy, retroperitoneal lymphadenopathy and hypermetabolic lymph nodes in each groin. 3. Scattered bony metastases in the thoracolumbar spine, bony pelvis, and left proximal femur. 4.  Aortic Atherosclerois (ICD10-170.0) 5. Question atypical infection in the left lower lobe.    Pt also has suspicion for bone involvement.  Pt underwent bone marrow aspiration and biopsy on 07/03/2017 with no evidence of DLBCL involving the bone marrow.  Pt was treated with standard regimen of RCDOP with Doxil instead of Adriamycin due to cardiomyopathy.  She received C1 on 07/04/2017 and was subsequently hospitalized at [facility].  She has been discharged to rehab.  Family reports patient has continued to decline significantly.  They and pt do not desire additional therapy.  The patient also confirms she desires to be made DNR.  Hospice has been contacted for ongoing management of this patient. DNR form signed  I have discussed overall prognosis with the patient and her family based on her chronic co-morbidities,  decreased performance status and advanced disease.  She is assigned to as needed follow-up.  2.  Thrombocytopenia and anemia, Leukopenia.  Hemoglobin 7.1, platelets 20,000, white count 0.4.  The patient is transfused 1 unit of packed red cells and 1 unit of platelets.  She has been receiving Neupogen for the past several days.  Patient and family indicated they do not desire ongoing treatment.  3.  SOB.  Pulse ox 95% on room air.  PCXR done today shows findings suggestive  of pneumonia.  She will receive Rocephin 1 g IV today in clinic.    4. Hypercalcemia.  Levels have improved with calcium of 7 on  labs done 07/16/2017.     5.  Elevated Uric acid.  Pt was treated with Rasburicase due to risk of TLS  and started empirically on Allopurinol.  Repeat Uric acid improved at 7.2.    6.  Weight loss.  Patient is now down to 103 pounds.  7.  Disposition.  Hospice discussion and order placed for Hospice evaluation.     Interval History:  77 year old female with a history of coronary artery disease, cardiomyopathy, CKD stage III, COPD, tobacco abuse, orthostatic hypotension, SLE/Sjogren's, and remote history of lymphoma presenting with epigastric pain and rib pain. The patient had a choking episode resulting in a Heimlich maneuver performed on her on 05/26/2017. Since that period of time, the patient has been complaining of "rib pain", epigastric pain, and right upper quadrant pain without exacerbating or alleviating factors. She denies any fevers, chills, nausea, vomiting, diarrhea, dysuria, hematuria. CT of the chest, abdomen, and pelvis revealed multiple abnormalities. It showed LULbronchiectasis. There is also bronchiectasis of the bilateral lower lobes with mucous plugging. There are scattered groundglass densities in the LLL and RML.There was also a new hypodense liver masses and aLUQmasslike process between the spleen and left upper kidney.  The MRI of the abdomen and pelvis suggested numerous liver masses with a left retroperitoneal mass at the pancreatic tail.  IR was consulted for liver biopsy.    On 06/03/2017, IR did not feel comfortable performing liver biospy due to patient's drop in Hgb. Pt was subsequently set up for liver biopsy that was done on 06/13/2017 with pathology returning as DLBCL.  She has also undergone PET scan and  ECHO.    Current status: Patient is seen today for evaluation prior to blood transfusion.  She continues to decline.  Family indicates they do not desire additional therapy.  When questioned patient also indicates she would like to be made a DNR and they are  open to hospice discussion.  Problem List Patient Active Problem List   Diagnosis Date Noted  . Diffuse large B-cell lymphoma of lymph nodes of multiple regions ([place]) [C83.38] 07/01/2017  . Liver masses [R16.0] 05/31/2017  . Acute renal failure superimposed on stage 4 chronic kidney disease ([place]) [N17.9, N18.4] 05/31/2017  . Bronchiectasis ([place]) [J47.9] 05/31/2017  . AKI (acute kidney injury) ([place]) [N17.9] 05/30/2017  . Rheumatoid arthritis ([place]) [M06.9] 05/30/2017  . Chronic lower back pain [M54.5, G89.29] 05/30/2017  . Abnormal CT of the abdomen [R93.5] 05/30/2017  . Abnormal CT of the chest [R93.89] 05/30/2017  . Hypercalcemia [E83.52] 05/30/2017  . Hypothyroid [E03.9] 07/03/2016  . Anxiety [F41.9] 07/03/2016  . Chronic back pain [M54.9, G89.29] 07/03/2016  . Lupus (systemic lupus erythematosus) ([place]) [M32.9] 11/02/2015  . Syncope [R55] 11/02/2015  . Chronic systolic CHF (congestive heart failure) ([place]) [I50.22] 10/31/2015  . Dehydration [E86.0] 10/28/2015  . Near syncope [R55] 10/28/2015  . CKD (chronic kidney disease) stage 4, GFR 15-29 ml/min (HCC) [N18.4] 10/28/2015  . Anemia of chronic disease [D63.8]   . Cardiomyopathy, ischemic [I25.5] 10/05/2015  . Abnormal nuclear stress test [R94.39] 10/05/2015  . Acute bronchitis with COPD ([place]) [J44.0, J20.9] 09/28/2015  . Recent NSTEMI (non-ST elevated myocardial infarction) ([place]) [I21.4] 09/15/2015  . Fever [R50.9] 09/15/2015  . Non-ST elevated myocardial infarction ([place]) [I21.4] 09/15/2015  . PMB (postmenopausal bleeding) [N95.0] 06/12/2015  . Atypical chest pain [R07.89]  07/13/2014  . DOE (dyspnea on exertion) [R06.09] 10/22/2013  . CHF, acute ([place]) [I50.9] 10/22/2013  . Systemic lupus erythematosus ([place]) [M32.9]   . Coronary artery disease involving native heart with angina pectoris ([place]) [I25.119]   . Palpitations [R00.2] 04/21/2013  . Chronic anticoagulation [Z79.01] 01/29/2012  . Pulmonary embolism ([place]) [I26.99]  11/28/2011  . Chest pain [R07.9] 09/06/2011  . Anemia [D64.9] 05/24/2011  . CKD (chronic kidney disease), stage IV ([place]) [N18.4] 05/24/2011  . Essential hypertension [I10]   . Hyperlipidemia [E78.5]   . Arteriosclerotic cardiovascular disease (ASCVD) [I25.10]   . Sjogren's syndrome ([place]) [M35.00]   . Tobacco abuse [Z72.0] 08/08/2010  . WEIGHT LOSS, ABNORMAL [R63.4] 07/05/2010  . Orthostatic hypotension [I95.1] 01/24/2010  . SYNCOPE [R55] 08/28/2009  . LYMPHOMA [C85.89] 10/31/2008    Past Medical History Past Medical History:  Diagnosis Date  . Anemia   . CAD (coronary artery disease)    DES to mid RCA 2006  . Cardiomyopathy ([place])   . Chronic lower back pain   . CKD (chronic kidney disease) stage 3, GFR 30-59 ml/min (HCC)   . COPD (chronic obstructive pulmonary disease) ([place]) Dx'd 09/28/2015  . Depression   . Dysphagia   . History of blood transfusion 09/2015  . Hyperlipidemia   . Kidney stones   . Lymphoma ([place])   . Orthostatic hypotension   . PMB (postmenopausal bleeding) 06/12/2015  . Pneumonia 09/28/2015  . Rheumatoid arthritis ([place])   . Sjogren's syndrome ([place])   . Syncope   . Systemic lupus erythematosus ([place])     Past Surgical History Past Surgical History:  Procedure Laterality Date  . BREAST BIOPSY Left   . CARDIAC CATHETERIZATION N/A 10/06/2015   Procedure: Right/Left Heart Cath and Coronary Angiography;  Surgeon: Leonie Man, MD;  Location: [facility];  Service: Cardiovascular;  Laterality: N/A;  . CATARACT EXTRACTION W/ INTRAOCULAR LENS  IMPLANT, BILATERAL Bilateral   . CORONARY ANGIOPLASTY WITH STENT PLACEMENT  2006   DES to mid RCA   . DILATION AND CURETTAGE OF UTERUS    . EYE SURGERY    . LAPAROSCOPIC CHOLECYSTECTOMY    . LITHOTRIPSY  X 3  . LYMPH NODE DISSECTION Left ~ 2000   "neck"  . MASS EXCISION Left ~ 2000   Lymphoma  . RETINAL DETACHMENT SURGERY Right   . TONSILLECTOMY Left ~ 2000  . TUBAL LIGATION      Family History Family  History  Problem Relation Age of Onset  . Coronary artery disease Father   . Coronary artery disease Mother   . Liver disease Mother   . Esophageal cancer Brother   . Breast cancer Sister      Social History  reports that she has been smoking cigarettes.  She started smoking about 31 years ago. She has a 5.75 pack-year smoking history. she has never used smokeless tobacco. She reports that she does not drink alcohol or use drugs.  Medications  Current Outpatient Medications:  .  acyclovir (ZOVIRAX) 400 MG tablet, Take 1 tablet (400 mg total) by mouth 2 (two) times daily., Disp: 60 tablet, Rfl: 3 .  albuterol (PROVENTIL) (2.5 MG/3ML) 0.083% nebulizer solution, Take 3 mLs (2.5 mg total) by nebulization every 6 (six) hours as needed for wheezing or shortness of breath., Disp: 75 mL, Rfl: 12 .  allopurinol (ZYLOPRIM) 100 MG tablet, Take 1 tablet (100 mg total) by mouth daily., Disp: 30 tablet, Rfl: 0 .  aspirin 81 MG tablet, Take 81 mg  by mouth daily., Disp: , Rfl:  .  atorvastatin (LIPITOR) 40 MG tablet, Take 1 tablet (40 mg total) by mouth daily., Disp: 90 tablet, Rfl: 1 .  clobetasol cream (TEMOVATE) 1.61 %, Apply 1 application topically 2 (two) times daily., Disp: , Rfl:  .  CYCLOPHOSPHAMIDE IV, Inject into the vein. Every 21 days, Disp: , Rfl:  .  cycloSPORINE (RESTASIS) 0.05 % ophthalmic emulsion, Place 1 drop into both eyes 2 (two) times daily., Disp: , Rfl:  .  Denosumab (XGEVA [place]), Inject into the skin. Every 28 days, Disp: , Rfl:  .  DOXOrubicin HCl Liposomal (DOXIL IV), Inject into the vein. Every 21 days, Disp: , Rfl:  .  FERREX 150 150 MG capsule, Take 1 capsule (150 mg total) by mouth every Monday, Wednesday, and Friday., Disp: 30 capsule, Rfl: 2 .  fluconazole (DIFLUCAN) 100 MG tablet, Take 1 tablet (100 mg total) by mouth daily., Disp: 30 tablet, Rfl: 0 .  folic acid (FOLVITE) 1 MG tablet, TAKE 1 TABLET (1 MG TOTAL) BY MOUTH EVERY MORNING., Disp: 90 tablet, Rfl: 1 .   hydroxychloroquine (PLAQUENIL) 200 MG tablet, TAKE 2 TABLETS BY MOUTH WITH FOOD OR MILK ONCE A DAY, Disp: 180 tablet, Rfl: 1 .  ketoconazole (NIZORAL) 2 % cream, Apply 1 application topically daily. Use on corner of mouth, Disp: 15 g, Rfl: 0 .  lidocaine-prilocaine (EMLA) cream, Apply to affected area once, Disp: 30 g, Rfl: 3 .  midodrine (PROAMATINE) 10 MG tablet, TAKE 2 TABLETS EVERY MORNING AND TAKE 1 TABLET EVERY EVENING, Disp: 270 tablet, Rfl: 0 .  mirtazapine (REMERON) 15 MG tablet, Take 1 tablet (15 mg total) by mouth at bedtime., Disp: 90 tablet, Rfl: 1 .  Multiple Vitamins-Minerals (PRESERVISION/LUTEIN PO), Take 1 capsule by mouth daily., Disp: , Rfl:  .  nitroGLYCERIN (NITROSTAT) 0.4 MG SL tablet, 1 TABLET UNDER TONGUE AT ONSET OF CHEST PAIN YOU MAY REPEAT EVERY 5 MINUTES FOR UP TO 3 DOSES., Disp: , Rfl: 3 .  omeprazole (PRILOSEC) 20 MG capsule, TAKE 1 CAPSULE BY MOUTH TWICE A DAY, Disp: 180 capsule, Rfl: 0 .  ondansetron (ZOFRAN) 8 MG tablet, Take 1 tablet (8 mg total) by mouth 2 (two) times daily as needed for refractory nausea / vomiting. Start on day 3 after chemotherapy., Disp: 30 tablet, Rfl: 1 .  oxyCODONE-acetaminophen (PERCOCET/ROXICET) 5-325 MG tablet, Take 1-2 tablets by mouth every 6 (six) hours as needed for moderate pain or severe pain., Disp: 120 tablet, Rfl: 0 .  Pegfilgrastim (NEULASTA ONPRO [place]), Inject into the skin. Every 21 days, Disp: , Rfl:  .  polyethylene glycol (MIRALAX / GLYCOLAX) packet, Take 17 g by mouth daily., Disp: 30 each, Rfl: 2 .  predniSONE (DELTASONE) 20 MG tablet, Take 3 tablets (60 mg) daily on days 1-5 of chemotherapy., Disp: 30 tablet, Rfl: 3 .  prochlorperazine (COMPAZINE) 10 MG tablet, Take 1 tablet (10 mg total) by mouth every 6 (six) hours as needed (Nausea or vomiting)., Disp: 30 tablet, Rfl: 6 .  RiTUXimab (RITUXAN IV), Inject into the vein. Every 21 days, Disp: , Rfl:  .  senna-docusate (SENOKOT-S) 8.6-50 MG tablet, Take 1 tablet by mouth 2  (two) times daily. For constipation., Disp: , Rfl:  .  sodium bicarbonate 325 MG tablet, Take 2 tablets (650 mg total) by mouth 3 (three) times daily., Disp: 180 tablet, Rfl: 3 .  temazepam (RESTORIL) 30 MG capsule, Take 1 capsule (30 mg total) by mouth at bedtime as needed for sleep.,  Disp: 30 capsule, Rfl: 1 .  Travoprost, BAK Free, (TRAVATAN) 0.004 % SOLN ophthalmic solution, Place 1 drop into both eyes at bedtime., Disp: , Rfl:  .  vinCRIStine 2 mg in sodium chloride 0.9 % 50 mL, Inject into the vein once. Every 21 days, Disp: , Rfl:  No current facility-administered medications for this visit.   Facility-Administered Medications Ordered in Other Visits:  .  filgrastim (NEUPOGEN) injection 480 mcg, 480 mcg, Subcutaneous, Once, Higgs, Vetta, MD .  heparin lock flush 100 unit/mL, 250 Units, Intracatheter, PRN, Higgs, Mathis Dad, MD  Allergies Florinef [fludrocortisone acetate]; Iohexol; Sulfonamide derivatives; and Tape  Review of Systems Review of Systems - Oncology ROS as per HPI otherwise 12 point ROS is negative other than fatigue, difficulty swallowing, weight loss   Physical Exam  Vitals Wt Readings from Last 3 Encounters:  07/16/17 103 lb 9.6 oz (47 kg)  07/04/17 113 lb 3.2 oz (51.3 kg)  06/27/17 115 lb 1.6 oz (52.2 kg)   Temp Readings from Last 3 Encounters:  07/17/17 (!) 100.4 F (38 C) (Tympanic)  07/16/17 98.1 F (36.7 C) (Tympanic)  07/04/17 97.6 F (36.4 C) (Oral)   BP Readings from Last 3 Encounters:  07/17/17 (!) 91/46  07/16/17 (!) 104/53  07/04/17 (!) 154/68   Pulse Readings from Last 3 Encounters:  07/17/17 100  07/16/17 93  07/04/17 61    Constitutional: Cachectic, in no distress.   HENT: Head: Normocephalic and atraumatic.  Mouth/Throat: No oropharyngeal exudate. Mucosa moist. Eyes: Pupils are equal, round, and reactive to light. Conjunctivae are normal. No scleral icterus.  Neck: Normal range of motion. Neck supple. No JVD present.  Cardiovascular:  Normal rate, regular rhythm and normal heart sounds.  Exam reveals no gallop and no friction rub.   No murmur heard. Pulmonary/Chest: Effort normal and breath sounds normal. No respiratory distress. No wheezes.No rales.  Abdominal: Soft. Bowel sounds are normal. Tender to palpation.   Musculoskeletal: No edema or tenderness.  Lymphadenopathy: No cervical, axillary  or supraclavicular adenopathy.  Neurological: Alert and oriented to person, place, and time. No cranial nerve deficit.  Skin: Skin is warm and dry. No rash noted. No erythema. No pallor.  Psychiatric: Affect and judgment normal.   Labs Infusion on 07/16/2017  Component Date Value Ref Range Status  . Blood Bank Specimen 07/16/2017 SAMPLE AVAILABLE FOR TESTING   Final  . Sample Expiration 07/16/2017    Final                   Value:07/19/2017 Performed at [facility], [address], [place], [place] [address]   . WBC 07/16/2017 0.4* 4.0 - 10.5 K/uL Final   Comment: REPEATED TO VERIFY CRITICAL RESULT CALLED TO, READ BACK BY AND VERIFIED WITH: AANDERSON AT 0906 BY HFLYNT 07/16/17 WHITE COUNT CONFIRMED ON SMEAR   . RBC 07/16/2017 2.45* 3.87 - 5.11 MIL/uL Final  . Hemoglobin 07/16/2017 7.1* 12.0 - 15.0 g/dL Final  . HCT 07/16/2017 22.4* 36.0 - 46.0 % Final  . MCV 07/16/2017 91.4  78.0 - 100.0 fL Final  . MCH 07/16/2017 29.0  26.0 - 34.0 pg Final  . MCHC 07/16/2017 31.7  30.0 - 36.0 g/dL Final  . RDW 07/16/2017 17.1* 11.5 - 15.5 % Final  . Platelets 07/16/2017 20* 150 - 400 K/uL Final   Comment: REPEATED TO VERIFY CRITICAL RESULT CALLED TO, READ BACK BY AND VERIFIED WITH: AANDERSON BY HFLYNT 07/16/17 AT 0906 PLATELET COUNT CONFIRMED BY SMEAR SPECIMEN CHECKED FOR CLOTS   .  Neutrophils Relative % 07/16/2017 27  % Final  . Lymphocytes Relative 07/16/2017 58  % Final  . Monocytes Relative 07/16/2017 15  % Final  . Eosinophils Relative 07/16/2017 0  % Final  . Basophils Relative 07/16/2017 0  % Final  . Neutro Abs  07/16/2017 0.1* 1.7 - 7.7 K/uL Final  . Lymphs Abs 07/16/2017 0.2* 0.7 - 4.0 K/uL Final  . Monocytes Absolute 07/16/2017 0.1  0.1 - 1.0 K/uL Final  . Eosinophils Absolute 07/16/2017 0.0  0.0 - 0.7 K/uL Final  . Basophils Absolute 07/16/2017 0.0  0.0 - 0.1 K/uL Final   Performed at [facility], [address] [place], [place] [address]  . Sodium 07/16/2017 134* 135 - 145 mmol/L Final  . Potassium 07/16/2017 3.7  3.5 - 5.1 mmol/L Final  . Chloride 07/16/2017 106  101 - 111 mmol/L Final  . CO2 07/16/2017 14* 22 - 32 mmol/L Final  . Glucose, Bld 07/16/2017 84  65 - 99 mg/dL Final  . BUN 07/16/2017 34* 6 - 20 mg/dL Final  . Creatinine, Ser 07/16/2017 2.89* 0.44 - 1.00 mg/dL Final  . Calcium 07/16/2017 7.0* 8.9 - 10.3 mg/dL Final  . GFR calc non Af Amer 07/16/2017 15* >60 mL/min Final  . GFR calc Af Amer 07/16/2017 17* >60 mL/min Final   Comment: (NOTE) The eGFR has been calculated using the CKD EPI equation. This calculation has not been validated in all clinical situations. eGFR's persistently <60 mL/min signify possible Chronic Kidney Disease.   Georgiann Hahn gap 07/16/2017 14  5 - 15 Final   Performed at [facility], [address] [place], [place] [address]  . Unit Number 07/16/2017 [id number]   Final  . Blood Component Type 07/16/2017 PLTP LI1 PAS   Final  . Unit division 07/16/2017 00   Final  . Status of Unit 07/16/2017 ISSUED,FINAL   Final  . Transfusion Status 07/16/2017    Final                   Value:OK TO TRANSFUSE Performed at [facility], [address], [place], [place] [address]   . Order Confirmation 07/16/2017    Final                   Value:ORDER PROCESSED BY BLOOD BANK Performed at [facility], [address], [place], [place] [address]   . ABO/RH(D) 07/16/2017 A NEG   Final  . Antibody Screen 07/16/2017 POS   Final  . Sample Expiration 07/16/2017 07/19/2017   Final  . Antibody Identification 65/68/1275 ANTI FYA (Duffy a)   Final  . DAT, IgG 07/16/2017 NEG    Final  . Unit Number 07/16/2017    Final                   TZGYF:[id number] Performed at [facility], [place] [address] [place], [place] [address]   . Blood Component Type 07/16/2017    Final                   Value:RED CELLS,LR Performed at [facility], [place] [address], [place], [place] [address]   . Unit division 07/16/2017    Final                   Value:00 Performed at [facility], [place] [address], [place], [place] [address]   . Status of Unit 07/16/2017    Final  Value:ISSUED Performed at [facility], [address] [place], [place] [address]   . Donor AG Type 07/16/2017 NEGATIVE FOR DUFFY A ANTIGEN   Final  . Transfusion Status 07/16/2017 OK TO TRANSFUSE   Final  . Crossmatch Result 07/16/2017 COMPATIBLE   Final  . ISSUE DATE / TIME 07/16/2017 [id number]   Final  . Blood Product Unit Number 07/16/2017 [id number]   Final  . PRODUCT CODE 07/16/2017 [id number]   Final  . Unit Type and Rh 07/16/2017 6200   Final  . Blood Product Expiration Date 07/16/2017 [id number]   Final  . Blood Product Unit Number 07/16/2017 [id number]   Final  . Unit Type and Rh 07/16/2017 0600   Final  . Blood Product Expiration Date 07/16/2017 [id number]   Final     Pathology No orders of the defined types were placed in this encounter.      Zoila Shutter MD

## 2017-07-17 NOTE — Patient Instructions (Signed)
[place] at [facility] Discharge Instructions One unit of blood given today.  neupagen given , hospice referral made today.    Thank you for choosing [place] at [facility] to provide your oncology and hematology care.  To afford each patient quality time with our provider, please arrive at least 15 minutes before your scheduled appointment time.   If you have a lab appointment with the [facility] please come in thru the  Main Entrance and check in at the main information desk  You need to re-schedule your appointment should you arrive 10 or more minutes late.  We strive to give you quality time with our providers, and arriving late affects you and other patients whose appointments are after yours.  Also, if you no show three or more times for appointments you may be dismissed from the clinic at the providers discretion.     Again, thank you for choosing [facility].  Our hope is that these requests will decrease the amount of time that you wait before being seen by our physicians.       _____________________________________________________________  Should you have questions after your visit to [facility], please contact our office at [phone] between the hours of 8:30 a.m. and 4:30 p.m.  Voicemails left after 4:30 p.m. will not be returned until the following business day.  For prescription refill requests, have your pharmacy contact our office.       Resources For Cancer Patients and their Caregivers ? American Cancer Society: Can assist with transportation, wigs, general needs, runs Look Good Feel Better.        ([phone] ? Cancer Care: Provides financial assistance, online support groups, medication/co-pay assistance.  1-800-813-HOPE 8144620239) ? [facility] Assists [place] cancer patients and their families through emotional , educational and financial support.   217 398 8982 ? [place] DSS Where to apply for food stamps, Medicaid and utility assistance. 539-012-4622 ? [facility]: Transportation to medical appointments. (463) 309-4412 ? Social Security Administration: May apply for disability if have a Stage IV cancer. (437) 367-1997 380-862-9417 ? [employer], Disability and Transit Services: Assists with nutrition, care and transit needs. [place] Support Programs:   > Cancer Support Group  2nd Tuesday of the month 1pm-2pm, Journey Room   > Creative Journey  3rd Tuesday of the month 1130am-1pm, Journey Room

## 2017-07-17 NOTE — Progress Notes (Signed)
Faxed referral and patient records to [facility].

## 2017-07-18 ENCOUNTER — Ambulatory Visit (HOSPITAL_COMMUNITY): Payer: Medicare Other

## 2017-07-18 ENCOUNTER — Encounter (HOSPITAL_COMMUNITY): Payer: Self-pay | Admitting: Internal Medicine

## 2017-07-18 ENCOUNTER — Encounter (HOSPITAL_COMMUNITY): Payer: Medicare Other

## 2017-07-18 LAB — TYPE AND SCREEN
ABO/RH(D): A NEG
ANTIBODY SCREEN: POSITIVE
DAT, IgG: NEGATIVE
Donor AG Type: NEGATIVE
Unit division: 0

## 2017-07-18 LAB — BPAM RBC
BLOOD PRODUCT EXPIRATION DATE: 201903182359
ISSUE DATE / TIME: 201903071849
Unit Type and Rh: 600

## 2017-07-18 LAB — CHROMOSOME ANALYSIS, BONE MARROW

## 2017-07-21 ENCOUNTER — Encounter (HOSPITAL_COMMUNITY): Payer: Medicare Other

## 2017-07-24 ENCOUNTER — Ambulatory Visit (HOSPITAL_COMMUNITY): Payer: Medicare Other

## 2017-07-24 ENCOUNTER — Other Ambulatory Visit: Payer: Self-pay | Admitting: Nurse Practitioner

## 2017-07-24 ENCOUNTER — Ambulatory Visit (HOSPITAL_COMMUNITY): Payer: Medicare Other | Admitting: Internal Medicine

## 2017-07-25 ENCOUNTER — Ambulatory Visit (HOSPITAL_COMMUNITY): Payer: Medicare Other

## 2017-08-11 DEATH — deceased

## 2017-08-14 ENCOUNTER — Ambulatory Visit (HOSPITAL_COMMUNITY): Payer: Medicare Other

## 2017-08-15 ENCOUNTER — Encounter (HOSPITAL_COMMUNITY): Payer: Medicare Other

## 2017-08-15 ENCOUNTER — Other Ambulatory Visit: Payer: Self-pay | Admitting: Pediatrics

## 2017-08-15 ENCOUNTER — Ambulatory Visit (HOSPITAL_COMMUNITY): Payer: Medicare Other

## 2017-08-15 DIAGNOSIS — D649 Anemia, unspecified: Secondary | ICD-10-CM

## 2019-01-01 IMAGING — MR MR ABDOMEN W/O CM
6 of 10 series · 25 of 48 positions shown · non-contrast
Comparison: 05/30/[address] abdomen/pelvis.

CLINICAL DATA: Multiple new liver masses on recent unenhanced CT
abdomen/pelvis.

EXAM:
MRI ABDOMEN WITHOUT CONTRAST
TECHNIQUE: Multiplanar multisequence MR imaging was performed without the
administration of intravenous contrast.

[Series 3: T2 · coronal · 5.0mm · 1.28mm/px · 3 of 40 slices shown]
[im 1/40]
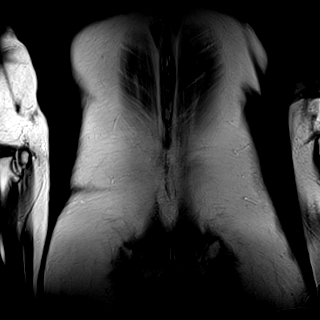
[im 20/40]
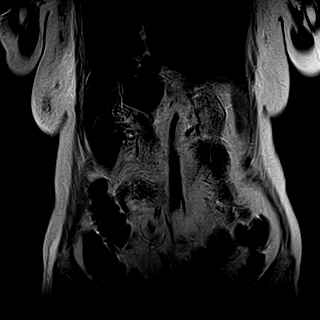
[im 40/40]
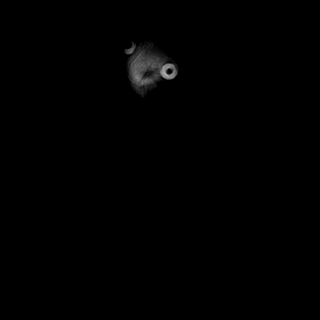

[Series 4: t2fs axial blade · axial · 4.0mm · 0.98mm/px · z∈[-101,+124]mm · 4 of 48 slices shown]
[im 1/48]
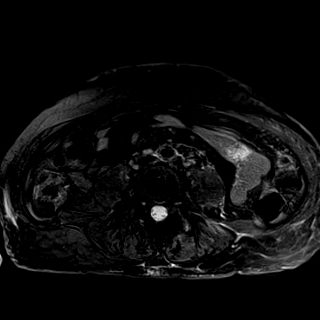
[im 16/48]
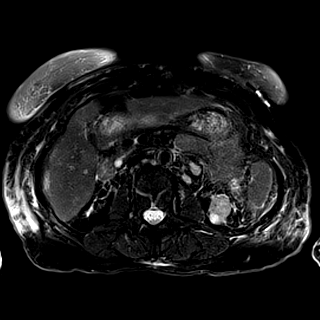
[im 32/48]
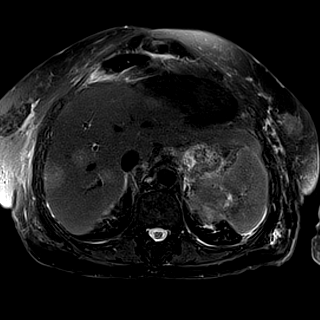
[im 48/48]
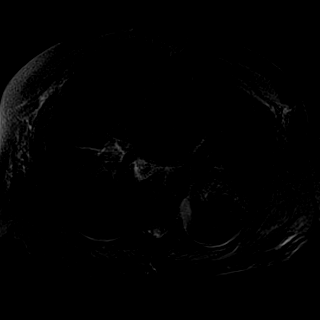

[Series 5: DWI · axial · 5.0mm · 0.86mm/px · z∈[-106,+128]mm · 6 of 79 slices shown (1 of 2)]
[im 1/79]
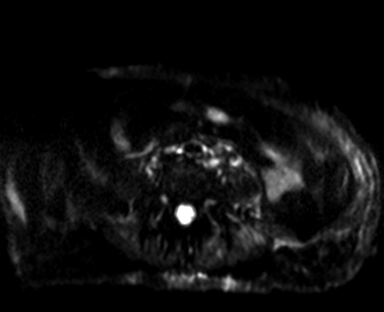
[im 16/79]
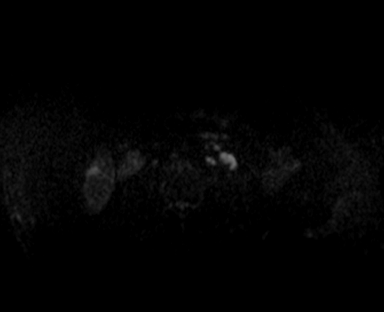
[im 32/79]
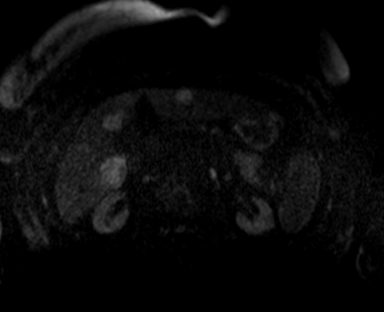
[im 47/79]
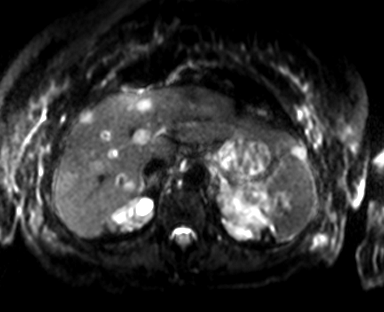
[im 63/79]
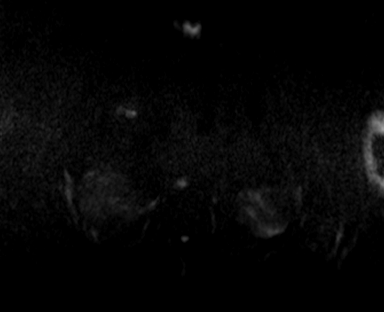
[im 79/79]
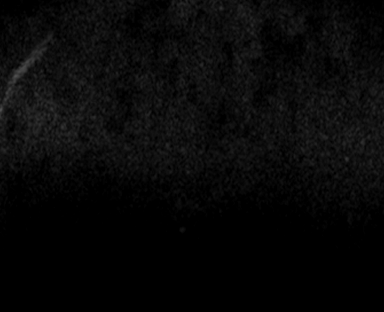

[Series 6: DWI · axial · 5.0mm · 0.86mm/px · z∈[-106,+128]mm · 3 of 40 slices shown (2 of 2)]
[im 1/40]
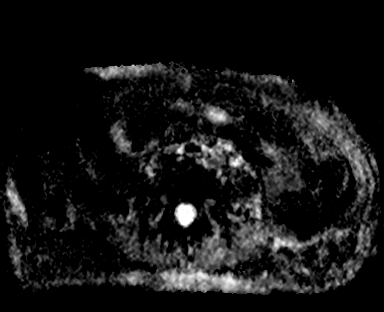
[im 20/40]
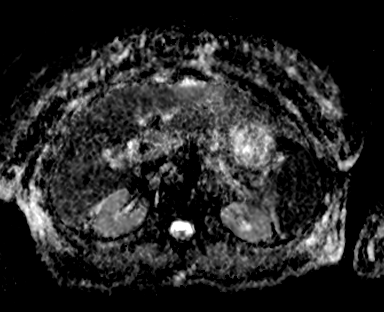
[im 40/40]
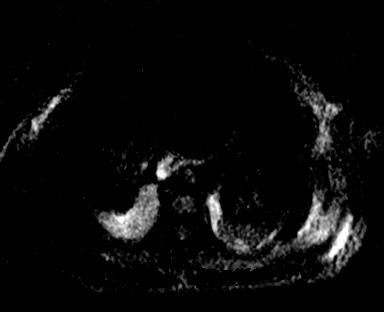

[Series 7: bSSFP · axial · 4.0mm · 0.62mm/px · z∈[-127,+149]mm · 5 of 70 slices shown]
[im 1/70]
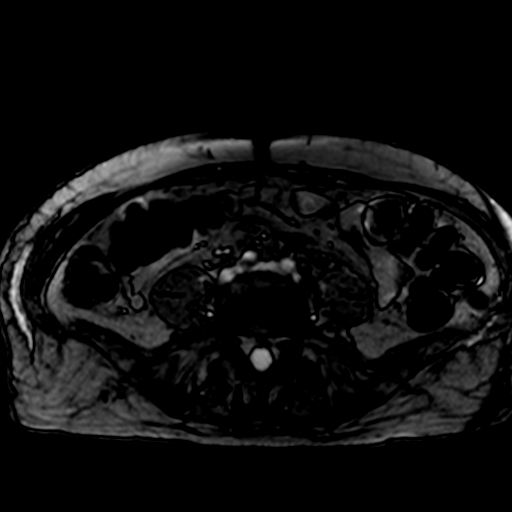
[im 18/70]
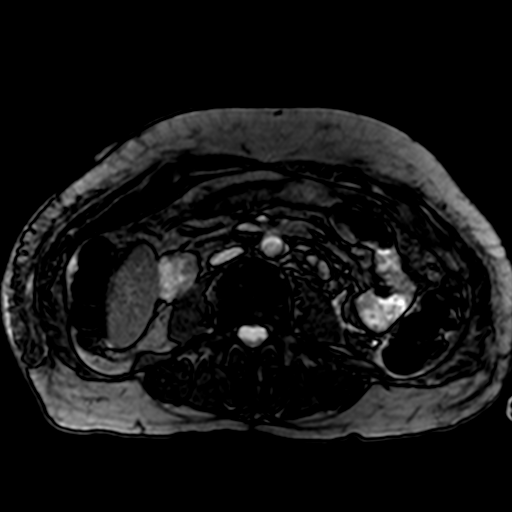
[im 35/70]
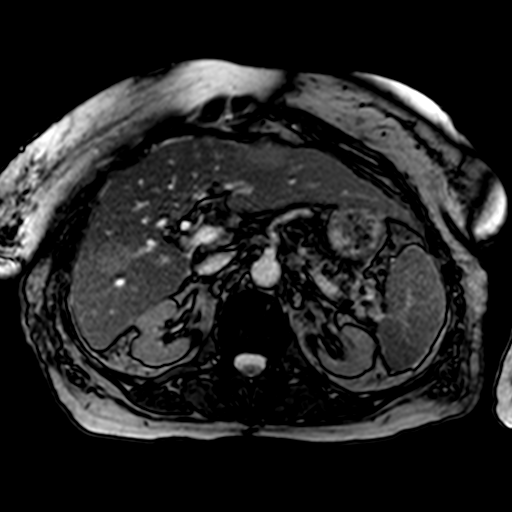
[im 52/70]
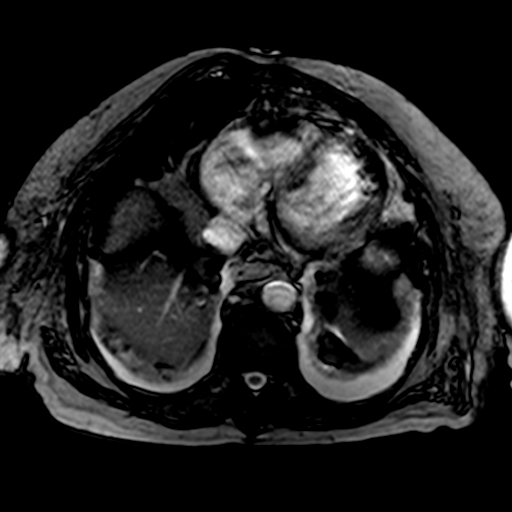
[im 70/70]
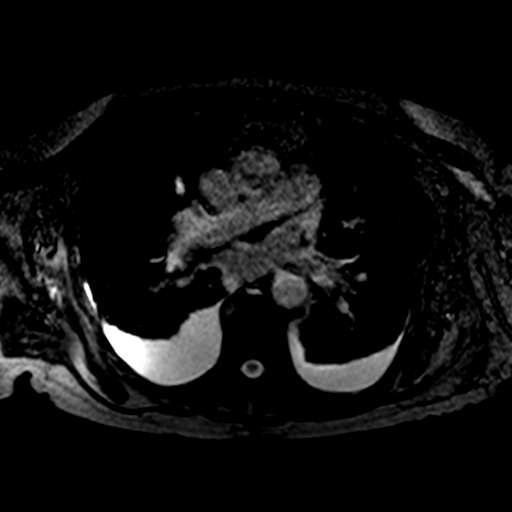

[Series 8: T1 · axial · 4.0mm · 0.59mm/px · z∈[-115,+73]mm · 4 of 64 slices shown]
[im 1/64]
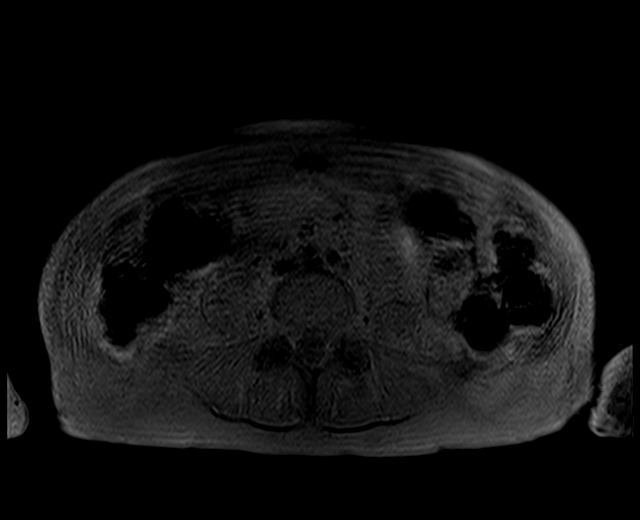
[im 16/64]
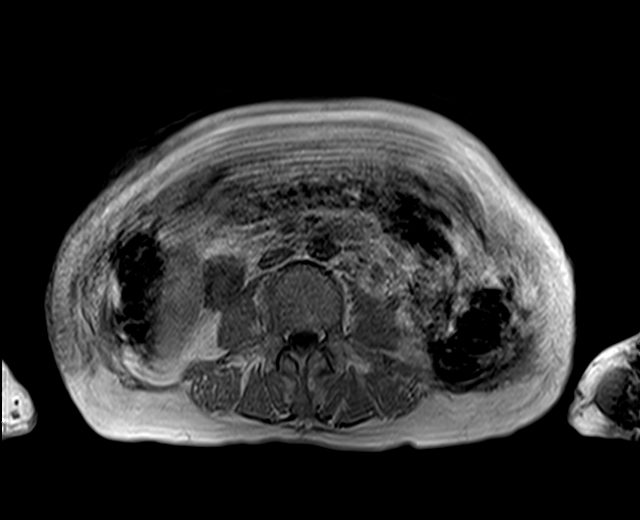
[im 32/64]
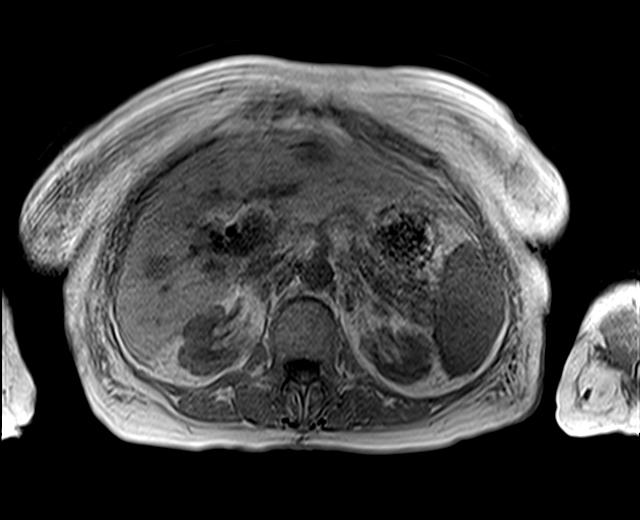
[im 48/64]
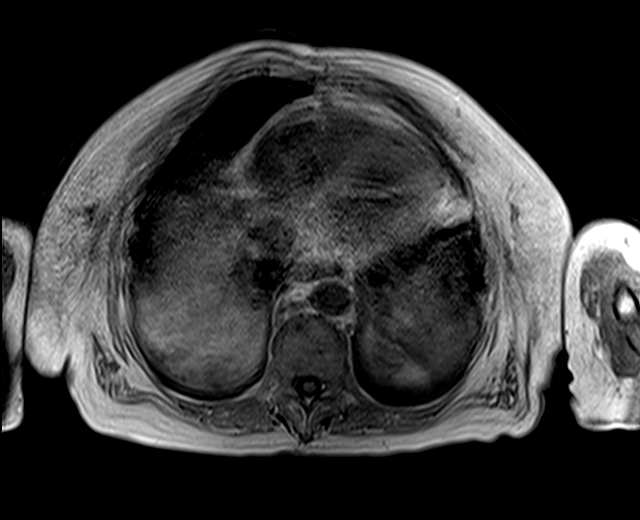

[25 of 48 positions shown; findings below may reference images not displayed]

FINDINGS: Lower chest: Small dependent bilateral pleural effusions. There is a
2.6 x 2.5 cm nodular opacity in the medial right middle lobe (series
4/image 7). Patchy left lower lobe opacity is not convincingly
changed from recent chest CT.

Hepatobiliary: No hepatic steatosis. There are numerous (greater
than 10) similar-appearing masses of various sizes scattered
throughout the liver, each demonstrating mild T2 hyperintensity and
T1 hypointensity with mildly restricted diffusion. Representative
2.9 x 2.0 cm peripheral right liver lobe mass (series 4/image 16)
and 2.7 x 2.5 cm anterior left liver lobe mass (series 4/image 23).
These masses all appear new since 10/29/2015 CT. Cholecystectomy.
Bile ducts are within normal post cholecystectomy limits. Common
bile duct diameter 5 mm. No evidence of choledocholithiasis.

Pancreas: There is a poorly marginated 4.6 x 4.3 cm upper left
retroperitoneal soft tissue mass centered in the pancreatic tail
(series 14/image 17), new since 10/29/2015 CT, which demonstrates
restricted diffusion and mild T2 hyperintensity and T1 hypointensity
compared to the spleen, which may invade upper pole of the left
kidney and splenic hilum. No pancreatic duct dilation. No additional
pancreatic mass. No evidence of pancreas divisum.

Spleen: Normal size.

Adrenals/Urinary Tract: Normal right adrenal. The left
retroperitoneal mass appears to involve the left adrenal gland. No
hydronephrosis. Simple appearing 1.6 cm upper right renal cyst.

Stomach/Bowel: Small hiatal hernia. Otherwise normal nondistended
stomach. Visualized small and large bowel is normal caliber, with no
bowel wall thickening.

Vascular/Lymphatic: Normal caliber abdominal aorta. Mild left
para-aortic adenopathy measuring up to 1.0 cm (series 7/image 54),
not definitely changed since 10/29/2015 CT.

Other: No abdominal ascites or focal fluid collection.

Musculoskeletal: No aggressive appearing focal osseous lesions.
IMPRESSION: 1. Poorly marginated 4.6 x 4.3 cm upper left retroperitoneal soft
tissue mass centered in the pancreatic tail with
involvement/invasion of the splenic hilum, upper pole left kidney
and left adrenal gland, new since 10/29/2015 CT. Malignancy is
suspected, favor lymphoma, differential includes primary pancreatic,
renal or adrenal malignancy.
2. Numerous (greater than 10) mildly T2 hyperintense liver masses
scattered throughout the liver, suspect metastases.
3. Nonspecific mild left para-aortic adenopathy.
4. Medial right middle lobe nodular pulmonary opacity, indeterminate
for neoplastic versus inflammatory etiology. Patchy left lower lobe
opacity, unchanged, suspect inflammatory etiology.
5. Small dependent bilateral pleural effusions.
6. Small hiatal hernia.
